# Patient Record
Sex: Female | Born: 1945 | Race: White | Hispanic: No | Smoking: Former smoker
Health system: Southern US, Community
[De-identification: ages and names within clinical notes are randomized; demographics above are authoritative.]

## PROBLEM LIST (undated history)

## (undated) DIAGNOSIS — G2581 Restless legs syndrome: Secondary | ICD-10-CM

## (undated) DIAGNOSIS — K219 Gastro-esophageal reflux disease without esophagitis: Secondary | ICD-10-CM

## (undated) DIAGNOSIS — F329 Major depressive disorder, single episode, unspecified: Secondary | ICD-10-CM

## (undated) DIAGNOSIS — R0600 Dyspnea, unspecified: Secondary | ICD-10-CM

## (undated) DIAGNOSIS — D649 Anemia, unspecified: Secondary | ICD-10-CM

## (undated) DIAGNOSIS — Z87442 Personal history of urinary calculi: Secondary | ICD-10-CM

## (undated) DIAGNOSIS — M359 Systemic involvement of connective tissue, unspecified: Secondary | ICD-10-CM

## (undated) DIAGNOSIS — Z86718 Personal history of other venous thrombosis and embolism: Secondary | ICD-10-CM

## (undated) DIAGNOSIS — F419 Anxiety disorder, unspecified: Secondary | ICD-10-CM

## (undated) DIAGNOSIS — M1990 Unspecified osteoarthritis, unspecified site: Secondary | ICD-10-CM

## (undated) DIAGNOSIS — T4145XA Adverse effect of unspecified anesthetic, initial encounter: Secondary | ICD-10-CM

## (undated) DIAGNOSIS — E1142 Type 2 diabetes mellitus with diabetic polyneuropathy: Secondary | ICD-10-CM

## (undated) DIAGNOSIS — T8859XA Other complications of anesthesia, initial encounter: Secondary | ICD-10-CM

## (undated) DIAGNOSIS — F32A Depression, unspecified: Secondary | ICD-10-CM

## (undated) DIAGNOSIS — E039 Hypothyroidism, unspecified: Secondary | ICD-10-CM

## (undated) DIAGNOSIS — G4700 Insomnia, unspecified: Secondary | ICD-10-CM

## (undated) DIAGNOSIS — I639 Cerebral infarction, unspecified: Secondary | ICD-10-CM

## (undated) HISTORY — DX: Anxiety disorder, unspecified: F41.9

## (undated) HISTORY — PX: KIDNEY STONE SURGERY: SHX686

## (undated) HISTORY — PX: CHOLECYSTECTOMY: SHX55

## (undated) HISTORY — DX: Cerebral infarction, unspecified: I63.9

## (undated) HISTORY — DX: Major depressive disorder, single episode, unspecified: F32.9

## (undated) HISTORY — DX: Depression, unspecified: F32.A

---
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
## 2011-03-27 DIAGNOSIS — G8110 Spastic hemiplegia affecting unspecified side: Secondary | ICD-10-CM | POA: Insufficient documentation

## 2011-07-06 DIAGNOSIS — F32A Depression, unspecified: Secondary | ICD-10-CM | POA: Insufficient documentation

## 2011-07-06 DIAGNOSIS — E039 Hypothyroidism, unspecified: Secondary | ICD-10-CM | POA: Insufficient documentation

## 2011-07-06 DIAGNOSIS — F5101 Primary insomnia: Secondary | ICD-10-CM | POA: Insufficient documentation

## 2012-05-06 DIAGNOSIS — N202 Calculus of kidney with calculus of ureter: Secondary | ICD-10-CM | POA: Insufficient documentation

## 2012-11-13 ENCOUNTER — Encounter: Payer: Self-pay | Admitting: Rehabilitation

## 2012-11-29 ENCOUNTER — Encounter: Payer: Self-pay | Admitting: Rehabilitation

## 2012-12-27 ENCOUNTER — Encounter: Payer: Self-pay | Admitting: Rehabilitation

## 2013-01-14 ENCOUNTER — Ambulatory Visit: Payer: Self-pay | Admitting: Pediatrics

## 2013-01-27 ENCOUNTER — Encounter: Payer: Self-pay | Admitting: Rehabilitation

## 2013-02-26 ENCOUNTER — Encounter: Payer: Self-pay | Admitting: Rehabilitation

## 2014-04-06 DIAGNOSIS — E785 Hyperlipidemia, unspecified: Secondary | ICD-10-CM | POA: Insufficient documentation

## 2014-12-28 ENCOUNTER — Encounter: Admit: 2014-12-28 | Disposition: A | Discharge status: Home / Self Care | Payer: Self-pay

## 2014-12-31 DIAGNOSIS — K219 Gastro-esophageal reflux disease without esophagitis: Secondary | ICD-10-CM | POA: Insufficient documentation

## 2015-01-28 ENCOUNTER — Encounter: Admit: 2015-01-28 | Disposition: A | Discharge status: Home / Self Care | Payer: Self-pay

## 2015-02-28 ENCOUNTER — Encounter: Payer: Self-pay | Admitting: Physical Therapy

## 2015-02-28 ENCOUNTER — Ambulatory Visit: Payer: Medicare Other | Attending: Rehabilitation | Admitting: Physical Therapy

## 2015-02-28 DIAGNOSIS — M6281 Muscle weakness (generalized): Secondary | ICD-10-CM | POA: Diagnosis not present

## 2015-02-28 DIAGNOSIS — G8114 Spastic hemiplegia affecting left nondominant side: Secondary | ICD-10-CM | POA: Diagnosis present

## 2015-02-28 DIAGNOSIS — R262 Difficulty in walking, not elsewhere classified: Secondary | ICD-10-CM | POA: Diagnosis not present

## 2015-03-01 NOTE — Therapy (Signed)
[facility] [address] [place], [address] Phone: [phone]   Fax:  [phone]  Physical Therapy Treatment  Patient Details  Name: Ariel Alvarez MRN: [id number] Date of Birth: Oct 28, 1946 Referring Provider:  Lottie Rater*  Encounter Date: 02/28/2015      PT End of Session - 02/28/15 1518    Visit Number 16   Number of Visits 25   Date for PT Re-Evaluation 03/30/15   Authorization Type 6   Authorization Time Period 20   PT Start Time 1415   PT Stop Time 1510   PT Time Calculation (min) 55 min   Activity Tolerance Patient limited by fatigue;Patient tolerated treatment well   Behavior During Therapy [facility] for tasks assessed/performed      Past Medical History  Diagnosis Date  . Anxiety   . Depression   . Stroke     History reviewed. No pertinent past surgical history.  There were no vitals filed for this visit.  Visit Diagnosis:  Muscle weakness (generalized)  Difficulty in walking      Subjective Assessment - 02/28/15 2022    Patient Stated Goals Patient would like to be able to use left arm for dressing,daily chores with less effort and walk with more confidence and strength in left leg   Currently in Pain? No/denies            [facility] PT Assessment - 03/01/15 0001    Assessment   Medical Diagnosis spastic hemiplegia affecting left nondominant side   Onset Date 10/29/13   Next MD Visit 03/30/2015   Precautions   Precautions Fall   Balance Screen   Has the patient fallen in the past 6 months No   Has the patient had a decrease in activity level because of a fear of falling?  No   Is the patient reluctant to leave their home because of a fear of falling?  No   Home Environment   Living Enviornment Private residence   Living Arrangements Spouse/significant other                 SUBJECTIVE: Patient reports she is tired today and not moving as well. She is  not sure why she is tired. Overall she feels she is still improving with physical therapy treatment for strengthening and guidance with exercises to improve walking and daily activities using left arm and leg. Her left arm/hand are tight today and her shoulders/neck area as well  PAIN (with rating/descriptors) No pain at present    OBJECTIVE: Palpation: increased tone/spasms in cervical spine into both upper trapezius muscles right> left Gait: ambulating independently without assistive device with improved base of support as compared to previous session, mild toe drag occasionally, improved hip and knee control in flexion for swing through, slow cadence today  1. With therapist assistance for positioning, control of movement with tactile and verbal cuing: sitting on edge of treatment table: ROM with assistance of therapist: upper trapezius release with lateral flexor stretch to right and left x 3 reps x 10 seconds holds, shoulder elevation and depression with tactile and verbal cues to control and complete full ROM, manual resistance and assistance with black resistive band around thighs for hip abduction 2 x 10 reps, Left UE AAROM shoulder forward elevation/extension with elbow supported and flexed at 90 degrees to decrease weight of arm,  external rotation, elbow flexion/extension with concentration on slow controlled motion without increased effort to perform, sets of  10 reps,  2. standing exercises with contact guarding and verbal cuing for good hip alignment and control of left LE with foot placement; side stepping along foam balance beam x 3 reps, with UE support on counter for safety and close supervision/contact guard as needed, 4" step up forward with each LE leading x 10 reps each with verbal cues for complete ROM and control, standing hip extension and abduction 1 x 5 reps with verbal cuing and tactile cues for correct alignment of trunk to avoid excessive forward trunk lean and lateral trunk  lean, assisted patient to car for safety following treatment session (improved posture and gait pattern noted)   Pt response for medical necessity: Patient fatigued during session today and therefore limited exercise session without NuStep performed at end of exercise session, she required verbal and tactile cues and contact guarding for safety during all standing exercises.  Patient with decreased BOS to WNL with gait,  hip and knee flexion and left hip extension improved along with improved safety awareness with walking outdoors to car  CLINICAL IMPRESSION:Patient demonstrates improvement with ability to work with left UE and LE. She requires assistance and verbal cues to perform exercises with correct alignment and controlled motions. She will benefit from additional physical therapy intervention to address weakness and controlled motion to allow improved function with left UE and LE for personal care and walking activies.     PLAN:Plan  PT EVAL, THERAP EXER-ROM EA 83M, MANUAL THERAPY; EA 15 MIN, GAIT TRAINING THERAPY, EA   Frequency & Duration 2/Week X 6Week(s)                 PT Long Term Goals - 02/28/15 1522    PT LONG TERM GOAL #1   Title Patient will be independent with home program with written instructions and minimal to no verbal cuing for self management in 6 weeks.    Time 6   Period Weeks   Status On-going   PT LONG TERM GOAL #2   Title Patient will reduce falls risk as indicated by Activities Specific Balance Confidence Scale score of 70%   Time 6   Period Weeks   Status Partially Met   PT LONG TERM GOAL #3   Title Patient will improve gait speed to 0.8 - 1.59ms on 10 m walk to indicate improved community ambulator and decreased risk for falls   Baseline 14.5 seconds (0.7 m/s: decreased: in need of intervention)   Time 6   Period Weeks   Status Partially Met   PT LONG TERM GOAL #4   Title Patient will demonstrate reduced falls risk and improved community  mobility as indicated by TUG to <13 seconds    Baseline 15.75 seconds: <14 = decreased for fall risk    Time 6   Period Weeks   Status Revised               Problem List There are no active problems to display for this patient.   BAldona Lento5/12/2014, 10:22 AM  [facility] [address] [place] [address]Phone: [phone]  Fax:  [phone]

## 2015-03-07 ENCOUNTER — Ambulatory Visit: Payer: Medicare Other | Admitting: Physical Therapy

## 2015-03-07 ENCOUNTER — Encounter: Payer: Self-pay | Admitting: Physical Therapy

## 2015-03-07 DIAGNOSIS — G8114 Spastic hemiplegia affecting left nondominant side: Secondary | ICD-10-CM | POA: Diagnosis not present

## 2015-03-07 DIAGNOSIS — M6281 Muscle weakness (generalized): Secondary | ICD-10-CM

## 2015-03-07 DIAGNOSIS — R262 Difficulty in walking, not elsewhere classified: Secondary | ICD-10-CM

## 2015-03-07 NOTE — Therapy (Signed)
[facility] [address] [place], [address] Phone: [phone]   Fax:  [phone]  Physical Therapy Treatment  Patient Details  Name: Ariel Alvarez MRN: [id number] Date of Birth: April 13, 1946 Referring Provider:  Lottie Rater*  Encounter Date: 03/07/2015      PT End of Session - 03/07/15 1550    Visit Number 17   Number of Visits 25   Date for PT Re-Evaluation 03/30/15   Authorization Type 17   Authorization Time Period 20   PT Start Time 1430   PT Stop Time 1538   PT Time Calculation (min) 68 min   Activity Tolerance Patient tolerated treatment well;Patient limited by fatigue   Behavior During Therapy [facility] for tasks assessed/performed      Past Medical History  Diagnosis Date  . Anxiety   . Depression   . Stroke     History reviewed. No pertinent past surgical history.  There were no vitals filed for this visit.  Visit Diagnosis:  Muscle weakness (generalized)  Difficulty in walking      Subjective Assessment - 03/07/15 1442    Subjective Paitent reports her left leg feels "lazy" and she feels sore in her ribs today and both shoulders and neck region. Also has a corn on her right foot which is bothering her with walkin today.    Patient Stated Goals Patient would like to be able to use left arm for dressing,daily chores with less effort and walk with more confidence and strength in left leg   Currently in Pain? Yes   Pain Score 2    Pain Location Back   Pain Orientation Mid   Pain Descriptors / Indicators Aching;Sore   Pain Type Acute pain   Pain Onset Today   Aggravating Factors  moving   Pain Relieving Factors unsure      OBJECTIVE: Palpation: increased tone/spasms in cervical spine into both upper trapezius muscles Gait: ambulating independently without assistive device with improved base of support as compared to previous session, mild toe drag occasionally, improved  hip and knee control in flexion for swing through, slow cadence today with reports of pain in right foot due to corn 1. With therapist assistance for positioning, control of movement with tactile and verbal cuing: sitting on edge of treatment table: ROM with assistance of therapist: upper trapezius release with lateral flexor stretch to right and left x 3 reps x 20 seconds holds, shoulder elevation and depression with tactile and verbal cues to control and complete full ROM x 10 reps, manual resistance and assistance with blue resistive band around thighs for hip abduction 1 x 15 reps, knee extension with 4# weight on ankles 2 x 15 reps each LE, knee flexion with blue resistive band and assistance to therapist with verbal cuing for correct technique x 25 reps each LE, Left UE AAROM shoulder forward elevation/extension with elbow supported and flexed at 90 degrees to decrease weight of arm, external rotation, elbow flexion/extension with concentration on slow controlled motion without increased effort to perform, sets of 10 reps,  2. standing exercises with contact guarding and verbal cuing for good hip alignment and control of left LE with foot placement; side stepping along foam balance beam x 2 reps, with UE support on counter for safety and close supervision/contact guard as needed, standing hip extension and abduction 1 x 5 reps with verbal cuing and tactile cues for correct alignment of trunk to avoid excessive forward trunk  lean and lateral trunk lean, walk against resistive band forward and backwards with close supervision and assistance with band x 5 reps (3-4 steps), NuStep x 7 min. (no charge),  assisted patient to car for safety following treatment session (improved posture and gait pattern noted)   Pt response for medical necessity: Patient fatigued during session today at end while finishing on NuStep., she required verbal and tactile cues and contact guarding for safety during all standing  exercises. Patient with decreased BOS to WNL with gait, hip and knee flexion and left hip extension improved along with improved safety awareness with walking outdoors to car. Patient reported having less soreness in back and overall feeling much better and more energy and strength at end of session.   CLINICAL IMPRESSION:Patient demonstrates improvement with ability to control movement for left UE and LE. She requires assistance and verbal cues to perform exercises with correct alignment and controlled motions. She will benefit from additional physical therapy intervention to address weakness and controlled motion to allow improved function with left UE and LE for personal care and walking activies.She continues to require verbal cuing as well and will continue with physical therapy in order to achieve independence with home program.                              PT Education - 03/07/15 1448    Education provided Yes   Education Details home program addressed with patient: use of ankle weights for knee extension, resistive band for knee flexion, concentrate on hip extension when walking   [name] Patient   Methods Explanation;Demonstration   Comprehension Verbalized understanding;Returned demonstration             PT Long Term Goals - 02/28/15 1522    PT LONG TERM GOAL #1   Title Patient will be independent with home program with written instructions and minimal to no verbal cuing for self management in 6 weeks.    Time 6   Period Weeks   Status On-going   PT LONG TERM GOAL #2   Title Patient will reduce falls risk as indicated by Activities Specific Balance Confidence Scale score of 70%   Time 6   Period Weeks   Status Partially Met   PT LONG TERM GOAL #3   Title Patient will improve gait speed to 0.8 - 1.84ms on 10 m walk to indicate improved community ambulator and decreased risk for falls   Baseline 14.5 seconds (0.7 m/s: decreased: in need of  intervention)   Time 6   Period Weeks   Status Partially Met   PT LONG TERM GOAL #4   Title Patient will demonstrate reduced falls risk and improved community mobility as indicated by TUG to <13 seconds    Baseline 15.75 seconds: <14 = decreased for fall risk    Time 6   Period Weeks   Status Revised               Plan - 03/07/15 1552    Clinical Impression Statement Patient demonstrated improved control of left UE and LE with exercises. Limited by fatigue and pain in right foot today. She was able to perform all exercises with assistance and close supervision/contact guard for safety with standing and walking exercises. Patient will benefit from additional physical therapy intervention to progress towards indepentent with home program without verbal cuing.    Pt will benefit from skilled therapeutic intervention in order  to improve on the following deficits Decreased strength;Impaired UE functional use;Impaired tone;Difficulty walking   Rehab Potential Good   PT Frequency 2x / week   PT Duration 6 weeks   PT Next Visit Plan theraeputic exercise for balance, walking and stregnthening left UE and LE         Problem List There are no active problems to display for this patient.   Aldona Lento 03/07/2015, 4:13 PM  [facility] [address] [place], [address] Phone: [phone]   Fax:  [phone]

## 2015-03-14 ENCOUNTER — Encounter: Payer: Self-pay | Admitting: Physical Therapy

## 2015-03-14 ENCOUNTER — Ambulatory Visit: Payer: Medicare Other | Admitting: Physical Therapy

## 2015-03-14 DIAGNOSIS — M6281 Muscle weakness (generalized): Secondary | ICD-10-CM

## 2015-03-14 DIAGNOSIS — G8114 Spastic hemiplegia affecting left nondominant side: Secondary | ICD-10-CM | POA: Diagnosis not present

## 2015-03-14 DIAGNOSIS — R262 Difficulty in walking, not elsewhere classified: Secondary | ICD-10-CM

## 2015-03-14 NOTE — Therapy (Signed)
[facility] [address] [place], [address] Phone: [phone]   Fax:  [phone]  Physical Therapy Treatment  Patient Details  Name: Ariel Alvarez MRN: [id number] Date of Birth: 1946-02-20 Referring Provider:  Lottie Rater*  Encounter Date: 03/14/2015      PT End of Session - 03/14/15 1524    Visit Number 18   Number of Visits 25   Date for PT Re-Evaluation 03/30/15   Authorization Type 18   Authorization Time Period 20   PT Start Time 1448   PT Stop Time 1528   PT Time Calculation (min) 40 min   Activity Tolerance Patient tolerated treatment well;Patient limited by fatigue   Behavior During Therapy [facility] for tasks assessed/performed      Past Medical History  Diagnosis Date  . Anxiety   . Depression   . Stroke     History reviewed. No pertinent past surgical history.  There were no vitals filed for this visit.  Visit Diagnosis:  Muscle weakness (generalized)  Difficulty in walking      Subjective Assessment - 03/14/15 1514    Subjective Patient reports that yesterday her left foot hit the floor harder than usual and she is concerned. She reports she is exercising at home regularly. She feels that therapy is helping with strength and movement.    Patient Stated Goals Patient would like to be able to use left arm for dressing,daily chores with less effort and walk with more confidence and strength in left leg   Currently in Pain? No/denies   Pain Onset --      OBJECTIVE: Palpation: increased tone/spasms in cervical spine into both upper trapezius muscles with + increased tenderness on left side  Gait: ambulating independently without assistive device  mild toe drag occasionally, improved hip and knee control in flexion for swing through, slow cadence with patient reporting she is tired 1. With therapist assistance for positioning, control of movement with tactile and verbal  cuing: sitting on edge of treatment table:therapeutic exercise for strength and flexibility with assistance of therapist: manual resistance and assistance with blue resistive band around thighs for hip abduction 1 x 15 reps, manual resistive hip abduction x 10 with end range hold x 5 seconds, hip flexion with assisted blue resistive band 10 reps each, knee flexion with blue resistive band and assistance to therapist with verbal cuing for correct technique x 15 reps each LE, Left UE AAROM shoulder forward elevation/extension with elbow supported and flexed at 90 degrees to decrease weight of arm, external rotation, elbow flexion/extension with concentration on slow controlled motion without increased effort to perform, sets of 10 reps,blue resistive band exercises for right UE including scapular row, lat pull down, elbow flexion and extension x 15 reps each with verbal and visual cues, demonstration 2. standing exercises with contact guarding and verbal cuing for good hip alignment and control of left LE with foot placement; sit to stand off high table at varying heights (24", 22" 20") sets of 5 with 30 second Rests between sets,  walk against resistive band forward and backwards with close supervision and assistance with band x 8 reps (3-4 steps),  assisted patient to car for safety following treatment session (improved posture and gait pattern noted)   Pt response for medical necessity: Patient fatigued during session today at end while performing walking against resistive band, she required verbal and tactile cues and contact guarding for safety during all standing exercises.  Patient with decreased BOS to WNL with gait, hip and knee flexion and left hip extension improved along with improved safety awareness with walking exercises against resistance and outdoors to car. Patient reported having overall feeling much better and more energy and strength at end of session.         PT Education - 03/14/15  1515    Education provided Yes   Education Details Instructed and assisted patient through exercises to isolate muscles and control movements with left UE and LE   [name] Patient   Methods Explanation;Tactile cues;Verbal cues   Comprehension Returned demonstration;Verbal cues required;Tactile cues required             PT Long Term Goals - 02/28/15 1522    PT LONG TERM GOAL #1   Title Patient will be independent with home program with written instructions and minimal to no verbal cuing for self management in 6 weeks.    Time 6   Period Weeks   Status On-going   PT LONG TERM GOAL #2   Title Patient will reduce falls risk as indicated by Activities Specific Balance Confidence Scale score of 70%   Time 6   Period Weeks   Status Partially Met   PT LONG TERM GOAL #3   Title Patient will improve gait speed to 0.8 - 1.45ms on 10 m walk to indicate improved community ambulator and decreased risk for falls   Baseline 14.5 seconds (0.7 m/s: decreased: in need of intervention)   Time 6   Period Weeks   Status Partially Met   PT LONG TERM GOAL #4   Title Patient will demonstrate reduced falls risk and improved community mobility as indicated by TUG to <13 seconds    Baseline 15.75 seconds: <14 = decreased for fall risk    Time 6   Period Weeks   Status Revised               Plan - 03/14/15 1543    Clinical Impression Statement Patient demonstrated imporved control and strength in left UE and LE with exercises. She was limited by fatigue at end of session. She was able to walk with less difficulty and with improved left hip extension and hip and knee flexion following treatment. She required assistance and close supervision/contact guard for safety with standing and walking exercises. patient will continue to benefit from additional physical therapy interveniotn to progress towards indepdnence with home program and self management of tone.   Pt will benefit from skilled  therapeutic intervention in order to improve on the following deficits Decreased strength;Impaired UE functional use;Impaired tone;Difficulty walking   Rehab Potential Good   PT Frequency 2x / week   PT Duration 6 weeks   PT Next Visit Plan theraeputic exercise for balance, walking and stregnthening left UE and LE         Problem List There are no active problems to display for this patient.  MJomarie Longs PT   03/14/2015, 4:00 PM  [facility] [address] [place] [address]Phone: [phone]  Fax:  [phone]

## 2015-03-21 ENCOUNTER — Ambulatory Visit: Payer: Medicare Other | Admitting: Physical Therapy

## 2015-03-21 ENCOUNTER — Encounter: Payer: Self-pay | Admitting: Physical Therapy

## 2015-03-21 DIAGNOSIS — M6281 Muscle weakness (generalized): Secondary | ICD-10-CM

## 2015-03-21 DIAGNOSIS — R262 Difficulty in walking, not elsewhere classified: Secondary | ICD-10-CM

## 2015-03-21 DIAGNOSIS — G8114 Spastic hemiplegia affecting left nondominant side: Secondary | ICD-10-CM | POA: Diagnosis not present

## 2015-03-21 NOTE — Therapy (Signed)
[place] [facility] [address] [place], [address] Phone: [phone]   Fax:  [phone]  Physical Therapy Treatment  Patient Details  Name: Ariel Alvarez MRN: [id number] Date of Birth: 04/07/1946 Referring Provider:  Lottie Rater*  Encounter Date: 03/21/2015      PT End of Session - 03/21/15 1540    Visit Number 19   Number of Visits 25   Date for PT Re-Evaluation 03/30/15   Authorization Type 19   Authorization Time Period 20   PT Start Time 1444   PT Stop Time 1528   PT Time Calculation (min) 44 min   Activity Tolerance Patient limited by fatigue;Patient tolerated treatment well   Behavior During Therapy [facility] for tasks assessed/performed      Past Medical History  Diagnosis Date  . Anxiety   . Depression   . Stroke     History reviewed. No pertinent past surgical history.  There were no vitals filed for this visit.  Visit Diagnosis:  Muscle weakness (generalized)  Difficulty in walking      Subjective Assessment - 03/21/15 1448    Subjective Patient reports she is tired today. Overall she is still doing well and exercises at home 2-3x/week. She reports she is more confident with walking and moving in general and is learning her limitaitons such as quick turns and going up and down curbs with caution.    Limitations Walking   Patient Stated Goals Patient would like to be able to use left arm for dressing,daily chores with less effort and walk with more confidence and strength in left leg   Currently in Pain? No/denies   Multiple Pain Sites No      Objective: Gait: slow cadence, decreased left hip flexion, extension throughout gait cycle, left UE held at side with hand in gripped position, increased tone with underlying low tone noted      [name]/Exercise - 03/21/15 1521    Exercises   Exercises Other Exercises: 1. With therapist assistance for positioning,  control of movement with tactile and verbal cuing: sitting on edge of treatment table: assistance with blue resistive band around thighs for hip abduction 1 x 15 reps, knee extension with 3# weights on ankle x 15 reps followed by knee flexion with blue resistive band and assistance of  therapist with verbal cuing for correct technique x 15 reps each LE, Left UE AAROM shoulder forward elevation/extension with elbow supported and flexed at 90 degrees to decrease weight of arm, external rotation, elbow flexion/extension with concentration on slow controlled motion without increased effort to perform, sets of 10 reps,blue resistive band exercises for right UE including scapular row, lat pull down, elbow flexion and extension x 15 reps each with verbal and visual cues, demonstration 2. standing exercises with contact guarding and verbal cuing for good hip alignment and control of left LE with foot placement; sit to stand off high table at varying heights (24", 22" 20") sets of 5 with 30 second Rests between sets, walk against resistive band forward and backwards with close supervision and assistance with band x 8 reps (3-4 steps),side stepping along blue foam balance beam x 2-3 sets, standing hip extension x 10 reps each LE at counter for support and close supervision for safety,  assisted patient to car for safety following treatment session (improved posture and gait pattern noted)   .        Pt response for medical necessity:  Patient fatigued during session today at end while performing walking against resistive band, she required verbal and tactile cues and contact guarding for safety during all standing exercises. Patient with decreased BOS to WNL with gait, hip and knee flexion and left hip extension improved along with improved safety awareness with walking exercises against resistance and outdoors to car. Patient reported having overall feeling much better and more energy and strength at end of  session           PT Education - 03/21/15 1520    Education provided Yes   Education Details re assessed home exercises for LE strengthening and walking activties.    [name] Patient   Methods Explanation;Verbal cues   Comprehension Verbalized understanding;Returned demonstration;Verbal cues required             PT Long Term Goals - 02/28/15 1522    PT LONG TERM GOAL #1   Title Patient will be independent with home program with written instructions and minimal to no verbal cuing for self management in 6 weeks.    Time 6   Period Weeks   Status On-going   PT LONG TERM GOAL #2   Title Patient will reduce falls risk as indicated by Activities Specific Balance Confidence Scale score of 70%   Time 6   Period Weeks   Status Partially Met   PT LONG TERM GOAL #3   Title Patient will improve gait speed to 0.8 - 1.69ms on 10 m walk to indicate improved community ambulator and decreased risk for falls   Baseline 14.5 seconds (0.7 m/s: decreased: in need of intervention)   Time 6   Period Weeks   Status Partially Met   PT LONG TERM GOAL #4   Title Patient will demonstrate reduced falls risk and improved community mobility as indicated by TUG to <13 seconds    Baseline 15.75 seconds: <14 = decreased for fall risk    Time 6   Period Weeks   Status Revised               Plan - 03/21/15 1545    Clinical Impression Statement Patient demonstrates improvement with confidence with standing, walking at home and in community. She is progressing with home program of exercise and should continue to progress with anticipation of transition to independent home program following re assessment next week. She continues to require verbal cues to perform exercises through full ROM     Pt will benefit from skilled therapeutic intervention in order to improve on the following deficits Decreased strength;Impaired UE functional use;Impaired tone;Difficulty walking   Rehab Potential  Good   PT Frequency 2x / week   PT Duration 6 weeks   PT Next Visit Plan theraeputic exercise for balance, walking and stregnthening left UE and LE         Problem List There are no active problems to display for this patient.  MJomarie Longs PT  03/21/2015, 8:32 PM  [facility] [address] [place] [address]Phone: [phone]  Fax:  [phone]

## 2015-03-29 ENCOUNTER — Encounter: Payer: Medicare Other | Admitting: Physical Therapy

## 2015-03-30 ENCOUNTER — Ambulatory Visit: Payer: Medicare Other | Admitting: Physical Therapy

## 2015-04-04 ENCOUNTER — Ambulatory Visit: Payer: Medicare Other | Admitting: Physical Therapy

## 2015-06-17 ENCOUNTER — Ambulatory Visit: Payer: Self-pay | Admitting: Family Medicine

## 2015-08-02 ENCOUNTER — Ambulatory Visit
Admission: EM | Admit: 2015-08-02 | Discharge: 2015-08-02 | Disposition: A | Discharge status: Home / Self Care | Payer: Medicare Other | Attending: Family Medicine | Admitting: Family Medicine

## 2015-08-02 ENCOUNTER — Encounter: Payer: Self-pay | Admitting: Emergency Medicine

## 2015-08-02 DIAGNOSIS — R21 Rash and other nonspecific skin eruption: Secondary | ICD-10-CM | POA: Diagnosis present

## 2015-08-02 DIAGNOSIS — N898 Other specified noninflammatory disorders of vagina: Secondary | ICD-10-CM

## 2015-08-02 DIAGNOSIS — B372 Candidiasis of skin and nail: Secondary | ICD-10-CM | POA: Diagnosis not present

## 2015-08-02 DIAGNOSIS — R0981 Nasal congestion: Secondary | ICD-10-CM | POA: Diagnosis present

## 2015-08-02 DIAGNOSIS — M940 Chondrocostal junction syndrome [Tietze]: Secondary | ICD-10-CM

## 2015-08-02 LAB — URINALYSIS COMPLETE WITH MICROSCOPIC (ARMC ONLY)
BILIRUBIN URINE: NEGATIVE
Bacteria, UA: NONE SEEN — AB
GLUCOSE, UA: NEGATIVE mg/dL
Hgb urine dipstick: NEGATIVE
Leukocytes, UA: NEGATIVE
Nitrite: NEGATIVE
Protein, ur: NEGATIVE mg/dL
Specific Gravity, Urine: 1.02 (ref 1.005–1.030)
pH: 6.5 (ref 5.0–8.0)

## 2015-08-02 LAB — WET PREP, GENITAL
CLUE CELLS WET PREP: NONE SEEN
Trich, Wet Prep: NONE SEEN
YEAST WET PREP: NONE SEEN

## 2015-08-02 MED ORDER — NYSTATIN 100000 UNIT/GM EX POWD: CUTANEOUS | Status: DC

## 2015-08-02 NOTE — Discharge Instructions (Signed)
Use medication as prescribed. Avoid scratching or rubbing. Monitor skin closely. Allow area to be open to air and keep dry.   Follow up with your primary care physician within the next week as scheduled. Return to Urgent care for new or worsening concerns.   Cutaneous Candidiasis Cutaneous candidiasis is a condition in which there is an overgrowth of yeast (candida) on the skin. Yeast normally live on the skin, but in small enough numbers not to cause any symptoms. In certain cases, increased growth of the yeast may cause an actual yeast infection. This kind of infection usually occurs in areas of the skin that are constantly warm and moist, such as the armpits or the groin. Yeast is the most common cause of diaper rash in babies and in people who cannot control their bowel movements (incontinence). CAUSES  The fungus that most often causes cutaneous candidiasis is Candida albicans. Conditions that can increase the risk of getting a yeast infection of the skin include:  Obesity.  Pregnancy.  Diabetes.  Taking antibiotic medicine.  Taking birth control pills.  Taking steroid medicines.  Thyroid disease.  An iron or zinc deficiency.  Problems with the immune system. SYMPTOMS   Red, swollen area of the skin.  Bumps on the skin.  Itchiness. DIAGNOSIS  The diagnosis of cutaneous candidiasis is usually based on its appearance. Light scrapings of the skin may also be taken and viewed under a microscope to identify the presence of yeast. TREATMENT  Antifungal creams may be applied to the infected skin. In severe cases, oral medicines may be needed.  HOME CARE INSTRUCTIONS   Keep your skin clean and dry.  Maintain a healthy weight.  If you have diabetes, keep your blood sugar under control. SEEK IMMEDIATE MEDICAL CARE IF:  Your rash continues to spread despite treatment.  You have a fever, chills, or abdominal pain. Document Released: 07/03/2011 Document Revised: 01/07/2012  Document Reviewed: 07/03/2011 [facility] Patient Information 2015 [place], [place]. This information is not intended to replace advice given to you by your health care provider. Make sure you discuss any questions you have with your health care provider.

## 2015-08-02 NOTE — ED Notes (Signed)
Pelvic exam done by L. Hyacinth Meeker NP and I&O cath urine obtained by this nurse. Assisted to sit on chair

## 2015-08-02 NOTE — ED Notes (Signed)
Patient c/o itchy bumps on her body for 3 months.  Patient also reports allergy symptoms like stuffy nose.

## 2015-08-02 NOTE — ED Provider Notes (Signed)
[facility] Emergency Department Provider Note  ____________________________________________  Time seen: Approximately 2:34 PM  I have reviewed the triage vital signs and the nursing notes.   HISTORY  Chief Complaint Nasal Congestion and Pruritis   HPI Ariel Alvarez is a 69 y.o. female presents for complaints of red itching and burning rash at lower abdomen. Also reports intermittent vaginal itching over last few days. States vaginal itching varies and is intermittent.  Denies vaginal bleeding, discharge, odor or pain. Denies abdominal pain. Denies chest pain, shortness of breath, back pain, leg pain or weakness. Reports history of similar with yeast infections. Denies other rash. States she does frequently have sensitive skin which reacts to certain things against her skin, but denies other rash. Denies changes in foods, medications, lotions, detergents, soaps, clothing changes, or other contacts. Denies using bath tub.  Patient states that she has intermittently noticed a rash to lower abdomen in the same area gets presents today for the last few months but states times approximately 1 week to 2 weeks the rash is been persistent. Patient states that the rash is itchy and burning sensation.  REports she has had a runny nose for a few days but states she is not here for that and does not want to be evaluated for runny nose. Denies cough, fever, sinus pressure, sore throat or facial pain. States runny nose is from her allergies.    Past Medical History  Diagnosis Date  . Anxiety   . Depression   . Stroke [facility])   with chronic left sided hemiparesis.   There are no active problems to display for this patient.   Past Surgical History  Procedure Laterality Date  . Cholecystectomy      Current Outpatient Rx  Name  Route  Sig  Dispense  Refill  . clonazePAM (KLONOPIN) 1 MG tablet   Oral   Take 2 mg by mouth at bedtime.         . eszopiclone (LUNESTA) 2  MG TABS tablet   Oral   Take 2 mg by mouth at bedtime as needed for sleep. Take immediately before bedtime         . omeprazole (PRILOSEC) 20 MG capsule   Oral   Take 20 mg by mouth daily.         . phentermine 30 MG capsule   Oral   Take 30 mg by mouth every morning.         . pravastatin (PRAVACHOL) 20 MG tablet   Oral   Take 20 mg by mouth daily.         Marland Kitchen tiZANidine (ZANAFLEX) 4 MG capsule   Oral   Take 4 mg by mouth 3 (three) times daily.         Marland Kitchen venlafaxine (EFFEXOR) 75 MG tablet   Oral   Take 75 mg by mouth 2 (two) times daily with a meal.         . cetirizine (ZYRTEC) 10 MG tablet   Oral   Take 10 mg by mouth daily.         Marland Kitchen levothyroxine (SYNTHROID, LEVOTHROID) 88 MCG tablet   Oral   Take 88 mcg by mouth daily before breakfast.         . lubiprostone (AMITIZA) 24 MCG capsule   Oral   Take 24 mcg by mouth 2 (two) times daily with a meal.         . PRAVASTATIN SODIUM PO   Oral  Take by mouth.         . venlafaxine (EFFEXOR) 50 MG tablet   Oral   Take 50 mg by mouth 2 (two) times daily.         Marland Kitchen warfarin (COUMADIN) 3 MG tablet   Oral   Take 3 mg by mouth daily.           Allergies Review of patient's allergies indicates no known allergies.  History reviewed. No pertinent family history.  Social History Social History  Substance Use Topics  . Smoking status: Former Smoker    Quit date: 10/29/1974  . Smokeless tobacco: None  . Alcohol Use: Yes    Review of Systems Constitutional: No fever/chills Eyes: No visual changes. ENT: No sore throat. Cardiovascular: Denies chest pain. Respiratory: Denies shortness of breath. Gastrointestinal: No abdominal pain.  No nausea, no vomiting.  No diarrhea.  No constipation. Genitourinary: Negative for dysuria.positive intermittent vaginal itching.  Musculoskeletal: Negative for back pain. Skin: positive for rash. Neurological: Negative for headaches, focal weakness or  numbness.  10-point ROS otherwise negative.  ____________________________________________   PHYSICAL EXAM:  VITAL SIGNS: ED Triage Vitals  Enc Vitals Group     BP 08/02/15 1309 134/73 mmHg     Pulse Rate 08/02/15 1309 82     Resp 08/02/15 1309 16     Temp 08/02/15 1309 97.6 F (36.4 C)     Temp Source 08/02/15 1309 Tympanic     SpO2 08/02/15 1309 98 %     Weight 08/02/15 1309 197 lb (89.359 kg)     Height 08/02/15 1309 5\' 3"  (1.6 m)     Head Cir --      Peak Flow --      Pain Score 08/02/15 1319 2     Pain Loc --      Pain Edu? --      Excl. in GC? --     Constitutional: Alert and oriented. Well appearing and in no acute distress. Eyes: Conjunctivae are normal. PERRL. EOMI. Head: Atraumatic.  Nose: mild clear rhinorrhea.  Mouth/Throat: Mucous membranes are moist.  Oropharynx non-erythematous. Neck: No stridor.  No cervical spine tenderness to palpation. Hematological/Lymphatic/Immunilogical: No cervical lymphadenopathy. Cardiovascular: Normal rate, regular rhythm. Grossly normal heart sounds.  Good peripheral circulation. Respiratory: Normal respiratory effort.  No retractions. Lungs CTAB. Pelvic: pelvic exam completed with RN Lynn-Anne at bedside.  External: no rash or lesions. Speculum: normal, no discharge, no swelling or bulging. Bimanual : nontender.  Gastrointestinal: Soft and nontender. No distention. Normal Bowel sounds.  No abdominal bruits. No CVA tenderness. Musculoskeletal: No lower or upper extremity tenderness nor edema.  No joint effusions. Bilateral pedal pulses equal and easily palpated.  Neurologic:  Normal speech and language. Chronic left sided hemiparesis, per patient unchanged.  Skin:  Skin is warm, dry and intact.  Left and right lower abdomen beneath abdominal skin fold and crease with well demarcated erythematic moist area, No surrounding erythema, no induration or fluctuance, no discharge. Left worse than right. No other rash. No other erythema.  No pustules or vesicles or lesions.  Psychiatric: Mood and affect are normal. Speech and behavior are normal.  ____________________________________________   LABS (all labs ordered are listed, but only abnormal results are displayed)  Labs Reviewed  WET PREP, GENITAL - Abnormal; Notable for the following:    WBC, Wet Prep HPF POC FEW (*)    All other components within normal limits  URINALYSIS COMPLETEWITH MICROSCOPIC ([facility] ONLY) - Abnormal; Notable for  the following:    Ketones, ur TRACE (*)    Bacteria, UA NONE SEEN (*)    Squamous Epithelial / LPF 0-5 (*)    All other components within normal limits  URINE CULTURE    INITIAL IMPRESSION / ASSESSMENT AND PLAN / ED COURSE  Pertinent labs & imaging results that were available during my care of the patient were reviewed by me and considered in my medical decision making (see chart for details).  Very well-appearing patient. No acute distress. Presents for the complaints of rash times approximately 1 week. Patient states that the rash intermittently will appear over last several months but persistent 1 week. States that the rash is just under her lower abdomen underneath her skin. Patient also reported intermittent vaginal itching and describes as occasional burning sensation.   Urinalysis reviewed, and no bacteria, will culture. Wet prep reviewed few WBCs, and no yeast. Vaginal speculum as well as bimanual was unremarkable. Patient with skin candidiasis at the lower abdominal skin crease. Counsel close monitoring. Will treat with topical nystatin topical powder to lower abdomen. Counseled to try to keep dry and clean. Counseled patient to follow-up with her primary care physician closely.  Counseled avoidance or soaps in vaginal area and to use water instead. Patient reports she has a follow-up with her primary care physician this week.Discussed follow up with Primary care physician this week. Discussed follow up and return parameters  including no resolution or any worsening concerns. Patient verbalized understanding and agreed to plan.   ____________________________________________   FINAL CLINICAL IMPRESSION(S) / ED DIAGNOSES  Final diagnoses:  Yeast infection of the skin  Vaginal irritation       Renford Dills, NP 08/02/15 1620

## 2016-04-06 DIAGNOSIS — G5601 Carpal tunnel syndrome, right upper limb: Secondary | ICD-10-CM | POA: Insufficient documentation

## 2016-12-30 DIAGNOSIS — R768 Other specified abnormal immunological findings in serum: Secondary | ICD-10-CM | POA: Insufficient documentation

## 2017-01-29 DIAGNOSIS — I776 Arteritis, unspecified: Secondary | ICD-10-CM | POA: Insufficient documentation

## 2017-02-03 DIAGNOSIS — I6350 Cerebral infarction due to unspecified occlusion or stenosis of unspecified cerebral artery: Secondary | ICD-10-CM | POA: Insufficient documentation

## 2017-02-06 DIAGNOSIS — I2699 Other pulmonary embolism without acute cor pulmonale: Secondary | ICD-10-CM | POA: Insufficient documentation

## 2017-02-16 ENCOUNTER — Emergency Department: Payer: Medicare Other

## 2017-02-16 ENCOUNTER — Encounter: Payer: Self-pay | Admitting: Emergency Medicine

## 2017-02-16 ENCOUNTER — Emergency Department
Admission: EM | Admit: 2017-02-16 | Discharge: 2017-02-16 | Disposition: A | Discharge status: Home / Self Care | Payer: Medicare Other | Attending: Emergency Medicine | Admitting: Emergency Medicine

## 2017-02-16 DIAGNOSIS — Z87891 Personal history of nicotine dependence: Secondary | ICD-10-CM | POA: Diagnosis not present

## 2017-02-16 DIAGNOSIS — R1084 Generalized abdominal pain: Secondary | ICD-10-CM | POA: Diagnosis not present

## 2017-02-16 DIAGNOSIS — I639 Cerebral infarction, unspecified: Secondary | ICD-10-CM | POA: Diagnosis not present

## 2017-02-16 DIAGNOSIS — Z7901 Long term (current) use of anticoagulants: Secondary | ICD-10-CM | POA: Insufficient documentation

## 2017-02-16 DIAGNOSIS — G8929 Other chronic pain: Secondary | ICD-10-CM | POA: Insufficient documentation

## 2017-02-16 HISTORY — DX: Systemic involvement of connective tissue, unspecified: M35.9

## 2017-02-16 LAB — CBC WITH DIFFERENTIAL/PLATELET
BASOS PCT: 1 %
Basophils Absolute: 0.1 10*3/uL (ref 0–0.1)
EOS ABS: 0.1 10*3/uL (ref 0–0.7)
Eosinophils Relative: 0 %
HCT: 38.3 % (ref 35.0–47.0)
HEMOGLOBIN: 12.4 g/dL (ref 12.0–16.0)
Lymphocytes Relative: 17 %
Lymphs Abs: 2.2 10*3/uL (ref 1.0–3.6)
MCH: 29.1 pg (ref 26.0–34.0)
MCHC: 32.4 g/dL (ref 32.0–36.0)
MCV: 89.7 fL (ref 80.0–100.0)
MONOS PCT: 7 %
Monocytes Absolute: 0.9 10*3/uL (ref 0.2–0.9)
NEUTROS PCT: 75 %
Neutro Abs: 9.9 10*3/uL — ABNORMAL HIGH (ref 1.4–6.5)
Platelets: 421 10*3/uL (ref 150–440)
RBC: 4.27 MIL/uL (ref 3.80–5.20)
RDW: 15.4 % — AB (ref 11.5–14.5)
WBC: 13.1 10*3/uL — ABNORMAL HIGH (ref 3.6–11.0)

## 2017-02-16 LAB — COMPREHENSIVE METABOLIC PANEL
ALBUMIN: 3 g/dL — AB (ref 3.5–5.0)
ALK PHOS: 65 U/L (ref 38–126)
ALT: 35 U/L (ref 14–54)
ANION GAP: 6 (ref 5–15)
AST: 39 U/L (ref 15–41)
BUN: 12 mg/dL (ref 6–20)
CO2: 30 mmol/L (ref 22–32)
Calcium: 8.5 mg/dL — ABNORMAL LOW (ref 8.9–10.3)
Chloride: 103 mmol/L (ref 101–111)
Creatinine, Ser: 0.65 mg/dL (ref 0.44–1.00)
GFR calc Af Amer: >60 mL/min (ref >60)
GFR calc non Af Amer: >60 mL/min (ref >60)
GLUCOSE: 124 mg/dL — AB (ref 65–99)
POTASSIUM: 3.9 mmol/L (ref 3.5–5.1)
SODIUM: 139 mmol/L (ref 135–145)
Total Bilirubin: 0.4 mg/dL (ref 0.3–1.2)
Total Protein: 6.4 g/dL — ABNORMAL LOW (ref 6.5–8.1)

## 2017-02-16 LAB — LIPASE, BLOOD: Lipase: 24 U/L (ref 11–51)

## 2017-02-16 MED ORDER — MORPHINE SULFATE (PF) 4 MG/ML IV SOLN
4.0000 mg | Freq: Once | INTRAVENOUS | Status: AC
  Administered 2017-02-16: 4 mg via INTRAVENOUS
  Filled 2017-02-16: qty 1

## 2017-02-16 MED ORDER — ONDANSETRON HCL 4 MG/2ML IJ SOLN
4.0000 mg | Freq: Once | INTRAMUSCULAR | Status: AC
  Administered 2017-02-16: 4 mg via INTRAVENOUS
  Filled 2017-02-16: qty 2

## 2017-02-16 MED ORDER — IOPAMIDOL (ISOVUE-300) INJECTION 61%
30.0000 mL | Freq: Once | INTRAVENOUS | Status: AC | PRN
  Administered 2017-02-16: 30 mL via ORAL

## 2017-02-16 MED ORDER — IOPAMIDOL (ISOVUE-300) INJECTION 61%
100.0000 mL | Freq: Once | INTRAVENOUS | Status: AC | PRN
  Administered 2017-02-16: 100 mL via INTRAVENOUS

## 2017-02-16 MED ORDER — LIDOCAINE 5 % EX PTCH: 1.0000 | MEDICATED_PATCH | Freq: Two times a day (BID) | CUTANEOUS | 0 refills | Status: AC
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
## 2017-02-16 NOTE — ED Notes (Signed)
Patient transported to CT 

## 2017-02-16 NOTE — ED Triage Notes (Signed)
Pt states has chronic abd pain that radiates down bilateral legs since having a stroke "weeks ago". Pt has left sided paralysis. Pt states pain is worse at night. Pt states she is taking gabapentin and zanaflex without relief in pain.

## 2017-02-16 NOTE — ED Notes (Signed)
Pt repositioned for comfort. Pt encouraged to drink po contrast. Pt is asking for additional pain medication, but pt drifts off to sleep while speaking to rn.

## 2017-02-16 NOTE — ED Notes (Signed)
Pt sts that she is unable to urinate at this time.  Bed pan removed

## 2017-02-16 NOTE — Discharge Instructions (Signed)
Please seek medical attention for any high fevers, chest pain, shortness of breath, change in behavior, persistent vomiting, bloody stool or any other new or concerning symptoms.  

## 2017-02-16 NOTE — ED Notes (Signed)
Pt placed on bedpan

## 2017-02-16 NOTE — ED Notes (Signed)
Report to ashley, rn

## 2017-02-16 NOTE — ED Provider Notes (Signed)
8:20 AM SW has seen patient. At this time feels it would be difficult placing from ER. Patient does have appt already scheduled today at 130 for [facility] home health to come. They would likely be the best chance for the patient to get further care. The patient will be discharged with lidoderm patch. Patient and companion both comfortable with that plan.    Phineas Semen, MD 02/16/17 [phone]

## 2017-02-16 NOTE — ED Notes (Signed)
Pt assisted with bedpan for possible bowel movement. Pt produced gas only.

## 2017-02-16 NOTE — ED Notes (Signed)
Helped pt to bathroom. Changed linens. Hooked pt back up to monitor.

## 2017-02-16 NOTE — ED Notes (Signed)
Pt assisted again with bedpan for possible urination without urine production. Feet covered for comfort.
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
## 2017-02-16 NOTE — ED Notes (Signed)
Pt assisted onto bedpan for possible urination. Pt requesting additional pain medication, dr. Manson Passey notified.
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
## 2017-02-17 DIAGNOSIS — M24542 Contracture, left hand: Secondary | ICD-10-CM | POA: Insufficient documentation

## 2017-02-27 ENCOUNTER — Emergency Department
Admission: EM | Admit: 2017-02-27 | Discharge: 2017-02-27 | Disposition: A | Discharge status: Home / Self Care | Payer: Medicare Other | Attending: Emergency Medicine | Admitting: Emergency Medicine

## 2017-02-27 ENCOUNTER — Encounter: Payer: Self-pay | Admitting: Vascular Surgery

## 2017-02-27 DIAGNOSIS — Z79899 Other long term (current) drug therapy: Secondary | ICD-10-CM | POA: Insufficient documentation

## 2017-02-27 DIAGNOSIS — Z87891 Personal history of nicotine dependence: Secondary | ICD-10-CM | POA: Insufficient documentation

## 2017-02-27 DIAGNOSIS — R52 Pain, unspecified: Secondary | ICD-10-CM | POA: Diagnosis present

## 2017-02-27 DIAGNOSIS — G894 Chronic pain syndrome: Secondary | ICD-10-CM | POA: Diagnosis not present

## 2017-02-27 DIAGNOSIS — N3000 Acute cystitis without hematuria: Secondary | ICD-10-CM

## 2017-02-27 LAB — URINALYSIS, COMPLETE (UACMP) WITH MICROSCOPIC
BILIRUBIN URINE: NEGATIVE
Glucose, UA: NEGATIVE mg/dL
Hgb urine dipstick: NEGATIVE
Ketones, ur: NEGATIVE mg/dL
NITRITE: NEGATIVE
PH: 5 (ref 5.0–8.0)
PROTEIN: NEGATIVE mg/dL
Specific Gravity, Urine: 1.029 (ref 1.005–1.030)

## 2017-02-27 LAB — BASIC METABOLIC PANEL
ANION GAP: 7 (ref 5–15)
BUN: 19 mg/dL (ref 6–20)
CO2: 31 mmol/L (ref 22–32)
Calcium: 8.8 mg/dL — ABNORMAL LOW (ref 8.9–10.3)
Chloride: 102 mmol/L (ref 101–111)
Creatinine, Ser: 0.78 mg/dL (ref 0.44–1.00)
GFR calc non Af Amer: >60 mL/min (ref >60)
GLUCOSE: 118 mg/dL — AB (ref 65–99)
POTASSIUM: 4.2 mmol/L (ref 3.5–5.1)
Sodium: 140 mmol/L (ref 135–145)

## 2017-02-27 MED ORDER — CEPHALEXIN 500 MG PO CAPS
500.0000 mg | ORAL_CAPSULE | Freq: Once | ORAL | Status: AC
  Administered 2017-02-27: 500 mg via ORAL
  Filled 2017-02-27: qty 1

## 2017-02-27 MED ORDER — CEPHALEXIN 500 MG PO CAPS
500.0000 mg | ORAL_CAPSULE | Freq: Four times a day (QID) | ORAL | 0 refills | Status: AC
Start: 2017-02-27 — End: 2017-03-09

## 2017-02-27 NOTE — ED Notes (Addendum)
Report received from [place] R.N. Pt. Alert and VS stable

## 2017-02-27 NOTE — ED Notes (Signed)
Ems here to transport pt to [facility].  Pt alert.  Family with pt.

## 2017-02-27 NOTE — ED Notes (Signed)
Per Rayfield Citizen, RN from SNF they will not take the patient back until her husband approves transfer back to the SNF.

## 2017-02-27 NOTE — ED Notes (Signed)
Handed patient her cell phone.

## 2017-02-27 NOTE — Discharge Instructions (Signed)
Please continue to take your pain medications as prescribed. Talk to the primary care physician at [facility] to make a long-term pain management plan.  Please take the entire course of antibiotics, even if you're feeling better.  Return to the emergency department for any new or severe pain, inability to keep down fluids, fainting, fever, or any other symptoms concerning to you.

## 2017-02-27 NOTE — ED Notes (Signed)
Hooked pt up to monitor.

## 2017-02-27 NOTE — ED Provider Notes (Addendum)
[facility] Emergency Department Provider Note  ____________________________________________  Time seen: Approximately 10:11 AM  I have reviewed the triage vital signs and the nursing notes.   HISTORY  Chief Complaint "Pain all over"    HPI Ariel Alvarez is a 71 y.o. 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 She does not feel they are giving her enough pain medication, "I needed every 4 hours or else it becomes full-blown." There is nothing new or different about her pain today, there is no focality, and she has not had any other symptoms including headache, vomiting, fever.  According to the patient's marked, she is receiving Norco, Xanax, gabapentin, a lidocaine patch, prednisone, baclofen, naproxen, acetaminophen, tizanidine.   Past Medical History:  Diagnosis Date  . Anxiety   . Collagen vascular disease (HCC)   . Depression   . Stroke [facility])     There are no active problems to display for this patient.   Past Surgical History:  Procedure Laterality Date  . CHOLECYSTECTOMY      Current Outpatient Rx  . Order #: [id number] Class: Print  . Order #: [id number] Class: Historical Med  . Order #: [id number] Class: Historical Med  . Order #: [id number] Class: Historical Med  . Order #: [id number] Class: Historical Med  . Order #: [id number] Class: Print  . Order #: [id number] Class: Historical Med  . Order #: [id number] Class: Print  . Order #: [id number] Class: Historical Med  . Order #: [id number] Class: Historical Med  . Order #: [id number] Class: Historical Med  . Order #: [id number] Class: Historical Med  . Order #: [id number] Class: Historical Med   . Order #: [id number] Class: Historical Med  . Order #: [id number] Class: Historical Med  . Order #: [id number] Class: Historical Med    Allergies Latex  No family history on file.  Social History Social History  Substance Use Topics  . Smoking status: Former Smoker    Quit date: 10/29/1974  . Smokeless tobacco: Never Used  . Alcohol use Yes    Review of Systems Constitutional: No fever/chills.Pain "all over." Eyes: No visual changes. ENT: Positive mild congestion which has been improving. Cardiovascular: Denies new chest pain. Denies palpitations. Respiratory: Denies shortness of breath.  Recent cough, now resolved. Gastrointestinal: No new abdominal pain.  No nausea, no vomiting.  No diarrhea.  No constipation. Genitourinary: Negative for dysuria. Musculoskeletal: Negative for new back pain. Skin: Negative for rash. Neurological: Positive for chronic left hemiparesis.  10-point ROS otherwise negative.  ____________________________________________   PHYSICAL EXAM:  VITAL SIGNS: ED Triage Vitals  Enc Vitals Group     BP 02/27/17 0930 (!) 141/71     Pulse Rate 02/27/17 0930 74     Resp 02/27/17 0930 16     Temp 02/27/17 0930 97.6 F (36.4 C)     Temp Source 02/27/17 0930 Oral     SpO2 02/27/17 0930 97 %     Weight --      Height --      Head Circumference --      Peak Flow --      Pain Score 02/27/17 0934 10     Pain Loc --      Pain Edu? --  Excl. in GC? --     Constitutional: The patient is alert and oriented and answering questions appropriately. Her speech is normal. She is chronically ill appearing but nontoxic. Eyes: Conjunctivae are normal.  EOMI. No scleral icterus. Head: Atraumatic. Nose: No congestion/rhinnorhea. Mouth/Throat: Mucous membranes are moist.  Neck: No stridor.  Supple.  No meningismus. Cardiovascular: Normal rate, regular rhythm. No murmurs, rubs or gallops.  Respiratory: Normal respiratory effort.  No accessory muscle use or  retractions. Lungs CTAB.  No wheezes, rales or ronchi. Gastrointestinal: Morbidly obese. Soft, nontender and nondistended.  No guarding or rebound.  No peritoneal signs. Musculoskeletal: No LE edema.  Neurologic:  A&Ox3.  Speech is clear.  Face and smile are symmetric.  EOMI.  left hemiparesis in the upper and lower extremities. Skin:  Skin is warm, dry and intact. No rash noted. Psychiatric: Normal mood with normal affect. Poor insight into her chronic medical conditions, but competent to make decisions.  ____________________________________________   LABS (all labs ordered are listed, but only abnormal results are displayed)  Labs Reviewed  BASIC METABOLIC PANEL - Abnormal; Notable for the following:       Result Value   Glucose, Bld 118 (*)    Calcium 8.8 (*)    All other components within normal limits  URINALYSIS, COMPLETE (UACMP) WITH MICROSCOPIC - Abnormal; Notable for the following:    Color, Urine YELLOW (*)    APPearance HAZY (*)    Leukocytes, UA SMALL (*)    Bacteria, UA MANY (*)    Squamous Epithelial / LPF 0-5 (*)    All other components within normal limits  URINE CULTURE   ____________________________________________  EKG  Not indicated ____________________________________________  RADIOLOGY  No results found.  ____________________________________________   PROCEDURES  Procedure(s) performed: None  Procedures  Critical Care performed: No ____________________________________________   INITIAL IMPRESSION / ASSESSMENT AND PLAN / ED COURSE  Pertinent labs & imaging results that were available during my care of the patient were reviewed by me and considered in my medical decision making (see chart for details).  71 y.o. female with a history of stroke resulting in left-sided hemiparesis, now at [facility] in the rehabilitation facility, presenting for chronic pain all over that has been going on for a week and is unchanged in character or severity,  nor is a have any focality. Overall, the patient has no signs or symptoms of an acute medical condition. She is hemodynamically stable. She is receiving multiple pain medications and antispasmodics on a regular basis at her rehabilitation facility. I have had a long conversation with the patient about her situation. She does not like the rehabilitation facility and is requesting to be admitted to [facility]. I have told her that she has no acute medical condition which would warrant admission at this time. I have made a call to have the primary care physician for [facility] age that I can discuss reevaluation of a long-term pain management plan, possible referral for pain management physician, and organize outpatient management for this patient. At this time, she is stable for discharge from the emergency department. Return precautions as well as follow-up instructions were discussed.  ----------------------------------------- 10:46 AM on 02/27/2017 -----------------------------------------  The patient's husband is not bedside and reports that she is not here for pain at all. He reports that she has had increased urinary frequency. She is scheduled for renal biopsy tomorrow as an outpatient. We will check a urine and a creatinine today.  ----------------------------------------- 11:46 AM on  02/27/2017 -----------------------------------------  The patient's urinalysis does show a urinary tract infection, and I have sent it for culture. We'll plan to discharge her home on Keflex, and give her the first dose here. Her creatinine is within normal limits. She is stable for discharge at this time.  ____________________________________________  FINAL CLINICAL IMPRESSION(S) / ED DIAGNOSES  Final diagnoses:  Chronic pain syndrome  Acute cystitis without hematuria         NEW MEDICATIONS STARTED DURING THIS VISIT:  New Prescriptions   CEPHALEXIN (KEFLEX) 500 MG CAPSULE    Take 1 capsule (500 mg  total) by mouth 4 (four) times daily.      Rockne Menghini, MD 02/27/17 1022    Rockne Menghini, MD 02/27/17 1047    Rockne Menghini, MD 02/27/17 1147

## 2017-02-27 NOTE — ED Triage Notes (Signed)
Pt reports to the ED via [facility] from [facility] for eval of pain all over. She had a stroke and has chronic pain r/t this per pt. Pt also states she may have rheumatoid  arthritis and states that she has an appointment at [facility] tomorrow for this but states her pain was severe so she had the SNF call 911 today. Pt states that she has pain from the top of her head to her feet.

## 2017-03-02 ENCOUNTER — Encounter: Payer: Self-pay | Admitting: Emergency Medicine

## 2017-03-02 ENCOUNTER — Emergency Department
Admission: EM | Admit: 2017-03-02 | Discharge: 2017-03-02 | Disposition: A | Discharge status: Home / Self Care | Payer: Medicare Other | Attending: Emergency Medicine | Admitting: Emergency Medicine

## 2017-03-02 ENCOUNTER — Emergency Department: Payer: Medicare Other

## 2017-03-02 DIAGNOSIS — Y929 Unspecified place or not applicable: Secondary | ICD-10-CM | POA: Insufficient documentation

## 2017-03-02 DIAGNOSIS — Z79899 Other long term (current) drug therapy: Secondary | ICD-10-CM | POA: Diagnosis not present

## 2017-03-02 DIAGNOSIS — S42202A Unspecified fracture of upper end of left humerus, initial encounter for closed fracture: Secondary | ICD-10-CM | POA: Diagnosis not present

## 2017-03-02 DIAGNOSIS — Y999 Unspecified external cause status: Secondary | ICD-10-CM | POA: Diagnosis not present

## 2017-03-02 DIAGNOSIS — Z7901 Long term (current) use of anticoagulants: Secondary | ICD-10-CM | POA: Insufficient documentation

## 2017-03-02 DIAGNOSIS — S4992XA Unspecified injury of left shoulder and upper arm, initial encounter: Secondary | ICD-10-CM | POA: Diagnosis present

## 2017-03-02 DIAGNOSIS — W19XXXA Unspecified fall, initial encounter: Secondary | ICD-10-CM | POA: Diagnosis not present

## 2017-03-02 DIAGNOSIS — Z87891 Personal history of nicotine dependence: Secondary | ICD-10-CM | POA: Insufficient documentation

## 2017-03-02 DIAGNOSIS — Y939 Activity, unspecified: Secondary | ICD-10-CM | POA: Insufficient documentation

## 2017-03-02 LAB — URINE CULTURE

## 2017-03-02 NOTE — ED Notes (Addendum)
Attempted to call reports to [facility].

## 2017-03-02 NOTE — Discharge Instructions (Signed)
We discussed your fracture with the orthopedic surgeon, Dr. Joice Lofts, who feels it will be best managed nonoperatively with a shoulder immobilizer.  Please try to keep the immobilizer in place as much as possible for comfort as well as for healing.  Use your regular pain medication or discussed with your primary care provider the need for additional pain medication.  Follow up with an outpatient appointment with Dr. Joice Lofts or one of his colleagues in about one week.

## 2017-03-02 NOTE — ED Provider Notes (Signed)
[facility] Emergency Department Provider Note  ____________________________________________   First MD Initiated Contact with Patient 03/02/17 1811     (approximate)  I have reviewed the triage vital signs and the nursing notes.   HISTORY  Chief Complaint Fall    HPI Ariel Alvarez is a 71 y.o. female with chronic left hemiparesis secondary to a recent CVA who lives at [facility].  She was sent by a EMS for evaluation of a left humerus fracture after a fall yesterday.  She does not have pain at rest and only has pain if her left arm is moved but she has very minimal movement and control over the arm due to the CVA.  She did not sustain any other injuries and denies any injury to her head and denies neck pain.  She denies chest pain, shortness of breath, nausea, vomiting, abdominal pain.  The pain in her arm as severe as the arm is moved but mild to nonexistent at rest.  The onset of the symptoms was acute yesterday were after her fall.   Past Medical History:  Diagnosis Date  . Anxiety   . Collagen vascular disease (HCC)   . Depression   . Stroke [facility])     There are no active problems to display for this patient.   Past Surgical History:  Procedure Laterality Date  . CHOLECYSTECTOMY      Prior to Admission medications   Medication Sig Start Date End Date Taking? Authorizing Provider  cephALEXin (KEFLEX) 500 MG capsule Take 1 capsule (500 mg total) by mouth 4 (four) times daily. 02/27/17 03/09/17  Rockne Menghini, MD  cetirizine (ZYRTEC) 10 MG tablet Take 10 mg by mouth daily.    [provider]  clonazePAM (KLONOPIN) 1 MG tablet Take 2 mg by mouth at bedtime.    [provider]  eszopiclone (LUNESTA) 2 MG TABS tablet Take 2 mg by mouth at bedtime as needed for sleep. Take immediately before bedtime    [provider]  levothyroxine (SYNTHROID, LEVOTHROID) 88 MCG tablet Take 88 mcg by mouth daily before  breakfast.    [provider]  lidocaine (LIDODERM) 5 % Place 1 patch onto the skin every 12 (twelve) hours. Remove & Discard patch within 12 hours or as directed by MD 02/16/17 02/16/18  Phineas Semen, MD  lubiprostone (AMITIZA) 24 MCG capsule Take 24 mcg by mouth 2 (two) times daily with a meal.    [provider]  nystatin (MYCOSTATIN/NYSTOP) 100000 UNIT/GM POWD Apply to the affected areas 2 to 3 times daily until healing is complete 08/02/15   Renford Dills, NP  omeprazole (PRILOSEC) 20 MG capsule Take 20 mg by mouth daily.    [provider]  phentermine 30 MG capsule Take 30 mg by mouth every morning.    [provider]  pravastatin (PRAVACHOL) 20 MG tablet Take 20 mg by mouth daily.    [provider]  PRAVASTATIN SODIUM PO Take by mouth.    [provider]  tiZANidine (ZANAFLEX) 4 MG capsule Take 4 mg by mouth 3 (three) times daily.    [provider]  venlafaxine (EFFEXOR) 50 MG tablet Take 50 mg by mouth 2 (two) times daily.    [provider]  venlafaxine (EFFEXOR) 75 MG tablet Take 75 mg by mouth 2 (two) times daily with a meal.    [provider]  warfarin (COUMADIN) 3 MG tablet Take 3 mg by mouth daily.  [provider]    Allergies Latex  History reviewed. No pertinent family history.  Social History Social History  Substance Use Topics  . Smoking status: Former Smoker    Quit date: 10/29/1974  . Smokeless tobacco: Never Used  . Alcohol use Yes    Review of Systems Constitutional: No fever/chills Eyes: No visual changes. ENT: No sore throat. Cardiovascular: Denies chest pain. Respiratory: Denies shortness of breath. Gastrointestinal: No abdominal pain.  No nausea, no vomiting.  No diarrhea.  No constipation. Genitourinary: Negative for dysuria. Musculoskeletal: Pain in LUE after fall yesterday, positive fracture on outpatient radiographs Integumentary: Negative for  rash. Neurological: Negative for headaches, focal weakness or numbness.   ____________________________________________   PHYSICAL EXAM:  VITAL SIGNS: ED Triage Vitals [03/02/17 1740]  Enc Vitals Group     BP 139/82     Pulse Rate (!) 105     Resp 19     Temp 97.9 F (36.6 C)     Temp Source Oral     SpO2 95 %     Weight 185 lb (83.9 kg)     Height 5\' 3"  (1.6 m)     Head Circumference      Peak Flow      Pain Score      Pain Loc      Pain Edu?      Excl. in GC?     Constitutional: Alert and oriented though appears somewhat somnolent. Eyes: Conjunctivae are normal. PERRL. EOMI. Head: Atraumatic. Nose: No congestion/rhinnorhea. Mouth/Throat: Mucous membranes are moist. Neck: No stridor.  No meningeal signs.  No cervical spine tenderness to palpation. Cardiovascular: Normal rate, regular rhythm. Good peripheral circulation. Grossly normal heart sounds. Respiratory: Normal respiratory effort.  No retractions. Lungs CTAB. Gastrointestinal: Soft and nontender. No distention.  Musculoskeletal: There is some ecchymosis and swelling in her proximal left upper arm consistent with her history of humerus fracture.  Tenderness and pain when the arm is passively moved but the patient does not have control over the arm secondary to her CVA.  Minimal grip strength in the left hand at baseline. Neurologic:  Normal speech and language. No gross focal neurologic deficits are appreciated.  Normal left-sided radial pulse. Skin:  Skin is warm, dry and intact. No rash noted.   ____________________________________________   LABS (all labs ordered are listed, but only abnormal results are displayed)  Labs Reviewed - No data to display ____________________________________________  EKG  None - EKG not ordered by ED physician ____________________________________________  RADIOLOGY   Dg Humerus Left  Result Date: 03/02/2017 CLINICAL DATA:  Fall.  Fracture EXAM: LEFT HUMERUS - 2+ VIEW  COMPARISON:  None. FINDINGS: Comminuted proximal humerus fracture. Mild lateral displacement with impaction of the fracture fragments. No dislocation. Diffuse soft tissue swelling involving the deltoid musculature. IMPRESSION: 1. Comminuted and slightly impacted proximal humerus fracture. Electronically Signed   By: Signa Kell M.D.   On: 03/02/2017 18:04    ____________________________________________   PROCEDURES  Critical Care performed: No   Procedure(s) performed:   Procedures   ____________________________________________   INITIAL IMPRESSION / ASSESSMENT AND PLAN / ED COURSE  Pertinent labs & imaging results that were available during my care of the patient were reviewed by me and considered in my medical decision making (see chart for details).  The patient is generally well-appearing and seems to be at her baseline.  She is a little bit somnolent at this time but there is no evidence of any traumatic head injury.  She wakes up and answers questions appropriately.  She is complaining of no pain at rest and only has pain when we move her arm.  I will call and speak with the orthopedic surgeon but I suspect nonoperative management would be most appropriate for her.  Although she is on warfarin there is no swelling of the arm significant enough to make me concerned for internal bleeding and her arm has soft compartments and is neurovascularly intact.   Clinical Course as of Mar 02 1902  Sat Mar 02, 2017  [id number] Discussed by phone with Dr. Joice Lofts who reviewed the radiographs and feels the patient will heal well non-operatively.  Requested shoulder immobilizer and outpatient follow up.  [CF]  1823 The patient was diagnosed with a urinary tract infection due to Klebsiella several days ago but she is being treated with Keflex and as per the sensitivities on the culture this appears to be adequate and appropriate treatment.  [CF]    Clinical Course User Index [CF] Loleta Rose, MD      ____________________________________________  FINAL CLINICAL IMPRESSION(S) / ED DIAGNOSES  Final diagnoses:  Closed fracture of proximal end of left humerus, unspecified fracture morphology, initial encounter     MEDICATIONS GIVEN DURING THIS VISIT:  Medications - No data to display   NEW OUTPATIENT MEDICATIONS STARTED DURING THIS VISIT:  New Prescriptions   No medications on file    Modified Medications   No medications on file    Discontinued Medications   No medications on file     Note:  This document was prepared using Dragon voice recognition software and may include unintentional dictation errors.    Loleta Rose, MD 03/02/17 [phone]

## 2017-03-02 NOTE — ED Triage Notes (Signed)
Pt had mechanical fall yesterday. Reports reaching for water and table slid.  Xray done at [facility] and sent for humerus fracture on left. Pt denies pain at this time, only if moves. Limited ROM left arm r/t previous CVA

## 2017-03-24 DIAGNOSIS — I82409 Acute embolism and thrombosis of unspecified deep veins of unspecified lower extremity: Secondary | ICD-10-CM | POA: Insufficient documentation

## 2017-04-26 ENCOUNTER — Encounter (INDEPENDENT_AMBULATORY_CARE_PROVIDER_SITE_OTHER): Payer: Self-pay | Admitting: Vascular Surgery

## 2017-05-06 DIAGNOSIS — I63411 Cerebral infarction due to embolism of right middle cerebral artery: Secondary | ICD-10-CM | POA: Insufficient documentation

## 2017-05-06 DIAGNOSIS — Z01818 Encounter for other preprocedural examination: Secondary | ICD-10-CM | POA: Insufficient documentation

## 2017-05-15 ENCOUNTER — Other Ambulatory Visit (INDEPENDENT_AMBULATORY_CARE_PROVIDER_SITE_OTHER): Payer: Medicare Other

## 2017-05-21 ENCOUNTER — Ambulatory Visit (INDEPENDENT_AMBULATORY_CARE_PROVIDER_SITE_OTHER): Payer: Medicare Other

## 2017-05-21 ENCOUNTER — Encounter (INDEPENDENT_AMBULATORY_CARE_PROVIDER_SITE_OTHER): Payer: Self-pay | Admitting: Vascular Surgery

## 2017-05-21 ENCOUNTER — Other Ambulatory Visit (INDEPENDENT_AMBULATORY_CARE_PROVIDER_SITE_OTHER): Payer: Self-pay | Admitting: Vascular Surgery

## 2017-05-21 ENCOUNTER — Ambulatory Visit (INDEPENDENT_AMBULATORY_CARE_PROVIDER_SITE_OTHER): Payer: Medicare Other | Admitting: Vascular Surgery

## 2017-05-21 DIAGNOSIS — I96 Gangrene, not elsewhere classified: Secondary | ICD-10-CM | POA: Diagnosis not present

## 2017-05-21 DIAGNOSIS — M359 Systemic involvement of connective tissue, unspecified: Secondary | ICD-10-CM | POA: Insufficient documentation

## 2017-05-21 DIAGNOSIS — G8929 Other chronic pain: Secondary | ICD-10-CM | POA: Insufficient documentation

## 2017-05-21 NOTE — Progress Notes (Signed)
Patient ID: Ariel Alvarez, female   DOB: 10/29/1946, 71 y.o.   MRN: [id number]  Chief Complaint  Patient presents with  . New Evaluation    HPI Ariel Alvarez is a 71 y.o. female.  I am asked to see the patient by Dr. 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  The patient reports A hangnail got infected on her right second finger. She eventually had some cream or salves get in the wound that festered and left her with a black second fingertip. She did not have any antecedent arm claudication symptoms. She denies fever or chills. She is referred for arterial evaluation. Her noninvasive studies today demonstrate normal triphasic flow throughout the right upper extremity without hemodynamically significant stenosis or signs of arterial insufficiency of the right upper extremity.   Past Medical History:  Diagnosis Date  . Anxiety   . Collagen vascular disease ([place])   . Depression   . Stroke [facility])     Past Surgical History:  Procedure Laterality Date  . CHOLECYSTECTOMY      Family History  Problem Relation Age of Onset  . Diabetes Son   . Cancer Paternal Aunt   . Stroke Paternal Grandmother     Social History Social History  Substance Use Topics  . Smoking status: Former Smoker    Quit date: 10/29/1974  . Smokeless tobacco: Never Used  . Alcohol use Yes     Allergies  Allergen Reactions  . Other Hives  . Latex Hives    Current Outpatient Prescriptions  Medication Sig Dispense Refill  . acetaminophen (TYLENOL) 325 MG tablet Take 325 mg by mouth.    . cetirizine (ZYRTEC) 10 MG tablet Take 10 mg by mouth daily.    . clonazePAM (KLONOPIN) 1 MG tablet Take 2 mg by mouth at bedtime.    . gabapentin (NEURONTIN) 300 MG capsule TAKE 2 CAPSULES TWICE A DAY AND TAKE 3 CAPSULES AT BEDTIME  0  . levothyroxine (SYNTHROID, LEVOTHROID) 100 MCG tablet Take by mouth.    . lidocaine (LIDODERM) 5 % Place 1 patch onto the skin every 12 (twelve) hours. Remove & Discard  patch within 12 hours or as directed by MD 10 patch 0  . Lifitegrast (XIIDRA) 5 % SOLN Xiidra 5 % eye drops in a dropperette  PLACE 1 DROP IN OU BID    . nystatin (MYCOSTATIN/NYSTOP) 100000 UNIT/GM POWD Apply to the affected areas 2 to 3 times daily until healing is complete 1 Bottle 0  . omeprazole (PRILOSEC) 20 MG capsule Take 20 mg by mouth daily.    Marland Kitchen oxyCODONE (OXY IR/ROXICODONE) 5 MG immediate release tablet Take 5 mg by mouth.    . phentermine 30 MG capsule Take 30 mg by mouth every morning.    Marland Kitchen PRAVASTATIN SODIUM PO Take by mouth.    . warfarin (COUMADIN) 3 MG tablet Take 3 mg by mouth daily.    . cetirizine (ZYRTEC) 10 MG tablet TAKE 1 TABLET EVERY DAY    . eszopiclone (LUNESTA) 2 MG TABS tablet Take 2 mg by mouth at bedtime as needed for sleep. Take immediately before bedtime    . levothyroxine (SYNTHROID, LEVOTHROID) 88 MCG tablet Take 88 mcg by mouth daily before breakfast.    . lubiprostone (AMITIZA) 24 MCG capsule Take 24 mcg by mouth 2 (two) times daily with a meal.    . nystatin (NYSTATIN) powder Nystop 100,000 unit/gram topical powder  APPLY TO AFFECTED AREA 3 TIMES DAILY    .  pravastatin (PRAVACHOL) 20 MG tablet Take 20 mg by mouth daily.    Marland Kitchen tiZANidine (ZANAFLEX) 4 MG capsule Take 4 mg by mouth 3 (three) times daily.    Marland Kitchen venlafaxine (EFFEXOR) 50 MG tablet Take 50 mg by mouth 2 (two) times daily.    Marland Kitchen venlafaxine (EFFEXOR) 75 MG tablet Take 75 mg by mouth 2 (two) times daily with a meal.    . warfarin (COUMADIN) 2.5 MG tablet Take 2.5 mg by mouth.     No current facility-administered medications for this visit.       REVIEW OF SYSTEMS (Negative unless checked)  Constitutional: '[]' Weight loss  '[]' Fever  '[]' Chills Cardiac: '[]' Chest pain   '[]' Chest pressure   '[]' Palpitations   '[]' Shortness of breath when laying flat   '[]' Shortness of breath at rest   '[]' Shortness of breath with exertion. Vascular:  '[]' Pain in legs with walking   '[]' Pain in legs at rest   '[]' Pain in legs when  laying flat   '[]' Claudication   '[]' Pain in feet when walking  '[]' Pain in feet at rest  '[]' Pain in feet when laying flat   '[]' History of DVT   '[]' Phlebitis   '[]' Swelling in legs   '[]' Varicose veins   '[x]' Non-healing ulcers Pulmonary:   '[]' Uses home oxygen   '[]' Productive cough   '[]' Hemoptysis   '[]' Wheeze  '[]' COPD   '[]' Asthma Neurologic:  '[]' Dizziness  '[]' Blackouts   '[]' Seizures   '[]' History of stroke   '[]' History of TIA  '[]' Aphasia   '[]' Temporary blindness   '[]' Dysphagia   '[x]' Weakness or numbness in arms   '[]' Weakness or numbness in legs Musculoskeletal:  '[]' Arthritis   '[]' Joint swelling   '[x]' Joint pain   '[]' Low back pain Hematologic:  '[]' Easy bruising  '[]' Easy bleeding   '[]' Hypercoagulable state   '[]' Anemic  '[]' Hepatitis Gastrointestinal:  '[]' Blood in stool   '[]' Vomiting blood  '[]' Gastroesophageal reflux/heartburn   '[]' Abdominal pain Genitourinary:  '[]' Chronic kidney disease   '[]' Difficult urination  '[]' Frequent urination  '[]' Burning with urination   '[]' Hematuria Skin:  '[]' Rashes   '[x]' Ulcers   '[x]' Wounds Psychological:  '[x]' History of anxiety   '[x]'  History of major depression.    Physical Exam There were no vitals taken for this visit. Gen:  WD/WN, NAD Head: [place]/AT, No temporalis wasting.  Ear/Nose/Throat: Hearing grossly intact, nares w/o erythema or drainage, oropharynx w/o Erythema/Exudate Eyes: Conjunctiva clear, sclera non-icteric  Neck: trachea midline.  No JVD.  Pulmonary:  Good air movement, clear to auscultation bilaterally.  Cardiac: RRR, normal S1, S2 Vascular:  Vessel Right Left  Radial Palpable Palpable                                   Gastrointestinal: soft, non-tender/non-distended.  Musculoskeletal: M/S 5/5 throughout.  Right second finger cyanotic and with ulceration.  Left arm in a sling. Neurologic: Sensation grossly intact in extremities.  Symmetrical.  Speech is fluent. Motor exam as listed above. Psychiatric: Judgment intact, Mood & affect appropriate for pt's clinical situation. Dermatologic: No  rashes or ulcers noted.  No cellulitis or open wounds.   Radiology No results found.  Labs Recent Results (from the past 2160 hour(s))  Basic metabolic panel     Status: Abnormal   Collection Time: 02/27/17 10:46 AM  Result Value Ref Range   Sodium 140 135 - 145 mmol/L   Potassium 4.2 3.5 - 5.1 mmol/L   Chloride 102 101 - 111 mmol/L   CO2 31 22 - 32 mmol/L  Glucose, Bld 118 (H) 65 - 99 mg/dL   BUN 19 6 - 20 mg/dL   Creatinine, Ser 0.78 0.44 - 1.00 mg/dL   Calcium 8.8 (L) 8.9 - 10.3 mg/dL   GFR calc non Af Amer >60 >60 mL/min   GFR calc Af Amer >60 >60 mL/min    Comment: (NOTE) The eGFR has been calculated using the CKD EPI equation. This calculation has not been validated in all clinical situations. eGFR's persistently <60 mL/min signify possible Chronic Kidney Disease.    Anion gap 7 5 - 15  Urinalysis, Complete w Microscopic     Status: Abnormal   Collection Time: 02/27/17 10:46 AM  Result Value Ref Range   Color, Urine YELLOW (A) YELLOW   APPearance HAZY (A) CLEAR   Specific Gravity, Urine 1.029 1.005 - 1.030   pH 5.0 5.0 - 8.0   Glucose, UA NEGATIVE NEGATIVE mg/dL   Hgb urine dipstick NEGATIVE NEGATIVE   Bilirubin Urine NEGATIVE NEGATIVE   Ketones, ur NEGATIVE NEGATIVE mg/dL   Protein, ur NEGATIVE NEGATIVE mg/dL   Nitrite NEGATIVE NEGATIVE   Leukocytes, UA SMALL (A) NEGATIVE   RBC / HPF 0-5 0 - 5 RBC/hpf   WBC, UA TOO NUMEROUS TO COUNT 0 - 5 WBC/hpf   Bacteria, UA MANY (A) NONE SEEN   Squamous Epithelial / LPF 0-5 (A) NONE SEEN  Urine culture     Status: Abnormal   Collection Time: 02/27/17 10:46 AM  Result Value Ref Range   Specimen Description URINE, CLEAN CATCH    Special Requests NONE    Culture >=100,000 COLONIES/mL KLEBSIELLA PNEUMONIAE (A)    Report Status 03/02/2017 FINAL    Organism ID, Bacteria KLEBSIELLA PNEUMONIAE (A)       Susceptibility   Klebsiella pneumoniae - MIC*    AMPICILLIN RESISTANT Resistant     CEFAZOLIN <=4 SENSITIVE Sensitive      CEFTRIAXONE <=1 SENSITIVE Sensitive     CIPROFLOXACIN <=0.25 SENSITIVE Sensitive     GENTAMICIN <=1 SENSITIVE Sensitive     IMIPENEM <=0.25 SENSITIVE Sensitive     NITROFURANTOIN 64 INTERMEDIATE Intermediate     TRIMETH/SULFA <=20 SENSITIVE Sensitive     AMPICILLIN/SULBACTAM 4 SENSITIVE Sensitive     PIP/TAZO <=4 SENSITIVE Sensitive     Extended ESBL NEGATIVE Sensitive     * >=100,000 COLONIES/mL KLEBSIELLA PNEUMONIAE    Assessment/Plan:  Collagen vascular disease (HCC) Could be contributing to her poor wound healing.  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 At this point, no arterial intervention is necessary. She should have adequate flow for wound healing and any procedure on the right hand. I will refer her to a hand surgeon. Follow-up as needed.      Leotis Pain 05/21/2017, 3:06 PM   This note was created with Dragon medical transcription system.  Any errors from dictation are unintentional.

## 2017-05-21 NOTE — Assessment & Plan Note (Signed)
Could be contributing to her poor wound healing.

## 2017-05-21 NOTE — Assessment & Plan Note (Signed)
Her noninvasive studies today demonstrate normal triphasic flow throughout the right upper extremity without hemodynamically significant stenosis or signs of arterial insufficiency of the right upper extremity. At this point, no arterial intervention is necessary. She should have adequate flow for wound healing and any procedure on the right hand. I will refer her to a hand surgeon. Follow-up as needed.

## 2017-05-22 ENCOUNTER — Other Ambulatory Visit (INDEPENDENT_AMBULATORY_CARE_PROVIDER_SITE_OTHER): Payer: Medicare Other

## 2017-06-18 ENCOUNTER — Encounter (INDEPENDENT_AMBULATORY_CARE_PROVIDER_SITE_OTHER): Payer: Medicare Other | Admitting: Vascular Surgery

## 2017-07-10 ENCOUNTER — Emergency Department
Admission: EM | Admit: 2017-07-10 | Discharge: 2017-07-10 | Disposition: A | Discharge status: Home / Self Care | Payer: Medicare Other | Attending: Emergency Medicine | Admitting: Emergency Medicine

## 2017-07-10 ENCOUNTER — Encounter: Payer: Self-pay | Admitting: *Deleted

## 2017-07-10 DIAGNOSIS — R197 Diarrhea, unspecified: Secondary | ICD-10-CM | POA: Diagnosis not present

## 2017-07-10 DIAGNOSIS — Z7901 Long term (current) use of anticoagulants: Secondary | ICD-10-CM | POA: Insufficient documentation

## 2017-07-10 DIAGNOSIS — R339 Retention of urine, unspecified: Secondary | ICD-10-CM | POA: Diagnosis present

## 2017-07-10 DIAGNOSIS — Z79899 Other long term (current) drug therapy: Secondary | ICD-10-CM | POA: Diagnosis not present

## 2017-07-10 DIAGNOSIS — Z87891 Personal history of nicotine dependence: Secondary | ICD-10-CM | POA: Diagnosis not present

## 2017-07-10 DIAGNOSIS — Z91040 Latex allergy status: Secondary | ICD-10-CM | POA: Diagnosis not present

## 2017-07-10 LAB — CBC WITH DIFFERENTIAL/PLATELET
BASOS ABS: 0.1 10*3/uL (ref 0–0.1)
Basophils Relative: 1 %
EOS ABS: 0.2 10*3/uL (ref 0–0.7)
EOS PCT: 2 %
HCT: 40.9 % (ref 35.0–47.0)
HEMOGLOBIN: 13.8 g/dL (ref 12.0–16.0)
LYMPHS PCT: 14 %
Lymphs Abs: 1.3 10*3/uL (ref 1.0–3.6)
MCH: 31.1 pg (ref 26.0–34.0)
MCHC: 33.7 g/dL (ref 32.0–36.0)
MCV: 92.1 fL (ref 80.0–100.0)
Monocytes Absolute: 0.5 10*3/uL (ref 0.2–0.9)
Monocytes Relative: 5 %
NEUTROS PCT: 78 %
Neutro Abs: 7.8 10*3/uL — ABNORMAL HIGH (ref 1.4–6.5)
PLATELETS: 299 10*3/uL (ref 150–440)
RBC: 4.44 MIL/uL (ref 3.80–5.20)
RDW: 15.4 % — ABNORMAL HIGH (ref 11.5–14.5)
WBC: 9.9 10*3/uL (ref 3.6–11.0)

## 2017-07-10 LAB — COMPREHENSIVE METABOLIC PANEL
ALT: 16 U/L (ref 14–54)
AST: 30 U/L (ref 15–41)
Albumin: 4 g/dL (ref 3.5–5.0)
Alkaline Phosphatase: 54 U/L (ref 38–126)
Anion gap: 11 (ref 5–15)
BILIRUBIN TOTAL: 0.6 mg/dL (ref 0.3–1.2)
BUN: 14 mg/dL (ref 6–20)
CO2: 24 mmol/L (ref 22–32)
Calcium: 9.4 mg/dL (ref 8.9–10.3)
Chloride: 105 mmol/L (ref 101–111)
Creatinine, Ser: 0.7 mg/dL (ref 0.44–1.00)
Glucose, Bld: 107 mg/dL — ABNORMAL HIGH (ref 65–99)
POTASSIUM: 4.3 mmol/L (ref 3.5–5.1)
Sodium: 140 mmol/L (ref 135–145)
TOTAL PROTEIN: 7.4 g/dL (ref 6.5–8.1)

## 2017-07-10 LAB — URINALYSIS, COMPLETE (UACMP) WITH MICROSCOPIC
BILIRUBIN URINE: NEGATIVE
GLUCOSE, UA: NEGATIVE mg/dL
HGB URINE DIPSTICK: NEGATIVE
KETONES UR: NEGATIVE mg/dL
Leukocytes, UA: NEGATIVE
NITRITE: NEGATIVE
PH: 5 (ref 5.0–8.0)
PROTEIN: NEGATIVE mg/dL
Specific Gravity, Urine: 1.027 (ref 1.005–1.030)

## 2017-07-10 LAB — LIPASE, BLOOD: LIPASE: 26 U/L (ref 11–51)

## 2017-07-10 NOTE — ED Triage Notes (Signed)
Pt to ED from home reporting difficulty urinating x 1 week. Pt report last time she urinated was 4:30 this morning and she felt as though "my pelvis was heavy." Pt reports burning with urination. PT denies blood in urine. Pt denies increased weakness. PT denies fevers.   Pt has hx of two strokes and has difficulty ambulating at baseline.

## 2017-07-10 NOTE — ED Provider Notes (Signed)
[facility] Emergency Department Provider Note  ____________________________________________   I have reviewed the triage vital signs and the nursing notes.   HISTORY  Chief Complaint Urinary Retention    HPI QUANTINA DERSHEM is a 71 y.o. female states she could not remember if she had urinated earlier today, but then she did urinate and she has no other complaints. No incontinence or weakness. She has had a few episodes of loose stool today. No fever no abdominal pain. No dysuria. Would like to go home. Here with caretaker.    Past Medical History:  Diagnosis Date  . Anxiety   . Collagen vascular disease (HCC)   . Depression   . Stroke [facility])     Patient Active Problem List   Diagnosis Date Noted  . Collagen vascular disease (HCC) 05/21/2017  . 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 CHOLECYSTECTOMY      Prior to Admission medications   Medication Sig Start Date End Date Taking? Authorizing Provider  acetaminophen (TYLENOL) 325 MG tablet Take 325 mg by mouth. 12/22/08   [provider]  cetirizine (ZYRTEC) 10 MG tablet Take 10 mg by mouth daily.    [provider]  cetirizine (ZYRTEC) 10 MG tablet TAKE 1 TABLET EVERY DAY 03/07/15   [provider]  clonazePAM (KLONOPIN) 1 MG tablet Take 2 mg by mouth at bedtime.    [provider]  eszopiclone (LUNESTA) 2 MG TABS tablet Take 2 mg by mouth at bedtime as needed for sleep. Take immediately before bedtime    [provider]  gabapentin (NEURONTIN) 300 MG capsule TAKE 2 CAPSULES TWICE A DAY AND TAKE 3 CAPSULES AT BEDTIME 03/12/17   [provider]  levothyroxine (SYNTHROID, LEVOTHROID) 100 MCG tablet Take by mouth. 04/19/17 04/19/18  [provider]  levothyroxine (SYNTHROID, LEVOTHROID) 88 MCG tablet Take 88 mcg by mouth daily before breakfast.    [provider]   lidocaine (LIDODERM) 5 % Place 1 patch onto the skin every 12 (twelve) hours. Remove & Discard patch within 12 hours or as directed by MD 02/16/17 02/16/18  Phineas Semen, MD  Lifitegrast Benay Spice) 5 % SOLN Xiidra 5 % eye drops in a dropperette  PLACE 1 DROP IN OU BID    [provider]  lubiprostone (AMITIZA) 24 MCG capsule Take 24 mcg by mouth 2 (two) times daily with a meal.    [provider]  nystatin (MYCOSTATIN/NYSTOP) 100000 UNIT/GM POWD Apply to the affected areas 2 to 3 times daily until healing is complete 08/02/15   Renford Dills, NP  nystatin (NYSTATIN) powder Nystop 100,000 unit/gram topical powder  APPLY TO AFFECTED AREA 3 TIMES DAILY    [provider]  omeprazole (PRILOSEC) 20 MG capsule Take 20 mg by mouth daily.    [provider]  oxyCODONE (OXY IR/ROXICODONE) 5 MG immediate release tablet Take 5 mg by mouth. 04/19/17   [provider]  phentermine 30 MG capsule Take 30 mg by mouth every morning.    [provider]  pravastatin (PRAVACHOL) 20 MG tablet Take 20 mg by mouth daily.    [provider]  PRAVASTATIN SODIUM PO Take by mouth.    [provider]  tiZANidine (ZANAFLEX) 4 MG capsule Take 4 mg by mouth 3 (three) times daily.    [provider]  venlafaxine (EFFEXOR) 50 MG tablet Take 50 mg by mouth 2 (two)  times daily.    [provider]  venlafaxine (EFFEXOR) 75 MG tablet Take 75 mg by mouth 2 (two) times daily with a meal.    [provider]  warfarin (COUMADIN) 2.5 MG tablet Take 2.5 mg by mouth.    [provider]  warfarin (COUMADIN) 3 MG tablet Take 3 mg by mouth daily.    [provider]    Allergies Other and Latex  Family History  Problem Relation Age of Onset  . Diabetes Son   . Cancer Paternal Aunt   . Stroke Paternal Grandmother     Social History Social History  Substance Use Topics  . Smoking status: Former Smoker    Quit date:  10/29/1974  . Smokeless tobacco: Never Used  . Alcohol use Yes    Review of Systems Constitutional: No fever/chills Eyes: No visual changes. ENT: No sore throat. No stiff neck no neck pain Cardiovascular: Denies chest pain. Respiratory: Denies shortness of breath. Gastrointestinal:   no vomiting.  + non bloody non melanotic diarrhea.  No constipation. Genitourinary: Negative for dysuria. Musculoskeletal: Negative lower extremity swelling Skin: Negative for rash. Neurological: Negative for severe headaches, focal weakness or numbness.   ____________________________________________   PHYSICAL EXAM:  VITAL SIGNS: ED Triage Vitals  Enc Vitals Group     BP 07/10/17 1302 114/63     Pulse Rate 07/10/17 1302 82     Resp 07/10/17 1302 16     Temp 07/10/17 1302 97.8 F (36.6 C)     Temp Source 07/10/17 1302 Oral     SpO2 07/10/17 1302 94 %     Weight 07/10/17 1303 181 lb (82.1 kg)     Height 07/10/17 1303 5\' 3"  (1.6 m)     Head Circumference --      Peak Flow --      Pain Score 07/10/17 1302 7     Pain Loc --      Pain Edu? --      Excl. in GC? --     Constitutional: Alert and oriented. Well appearing and in no acute distress. Eyes: Conjunctivae are normal Head: Atraumatic HEENT: No congestion/rhinnorhea. Mucous membranes are moist.  Oropharynx non-erythematous Neck:   Nontender with no meningismus, no masses, no stridor Cardiovascular: Normal rate, regular rhythm. Grossly normal heart sounds.  Good peripheral circulation. Respiratory: Normal respiratory effort.  No retractions. Lungs CTAB. Abdominal: Soft and nontender. No distention. No guarding no rebound Back:  There is no focal tenderness or step off.  there is no midline tenderness there are no lesions noted. there is no CVA tenderness Musculoskeletal: No lower extremity tenderness, no upper extremity tenderness. No joint effusions, no DVT signs strong distal pulses no edema Neurologic:  Normal speech and language. No  gross focal neurologic deficits are appreciated.  Skin:  Skin is warm, dry and intact. No rash noted. Psychiatric: Mood and affect are normal. Speech and behavior are normal.  ____________________________________________   LABS (all labs ordered are listed, but only abnormal results are displayed)  Labs Reviewed  COMPREHENSIVE METABOLIC PANEL - Abnormal; Notable for the following:       Result Value   Glucose, Bld 107 (*)    All other components within normal limits  CBC WITH DIFFERENTIAL/PLATELET - Abnormal; Notable for the following:    RDW 15.4 (*)    Neutro Abs 7.8 (*)    All other components within normal limits  LIPASE, BLOOD  URINALYSIS, COMPLETE (UACMP) WITH MICROSCOPIC   ____________________________________________  EKG  I personally interpreted any EKGs ordered by me or triage  ____________________________________________  RADIOLOGY  I reviewed any imaging ordered by me or triage that were performed during my shift and, if possible, patient and/or family made aware of any abnormal findings. ____________________________________________   PROCEDURES  Procedure(s) performed: None  Procedures  Critical Care performed: None  ____________________________________________   INITIAL IMPRESSION / ASSESSMENT AND PLAN / ED COURSE  Pertinent labs & imaging results that were available during my care of the patient were reviewed by me and considered in my medical decision making (see chart for details).  Pt here with no complaint aside from diarrhea. No recent travel or abx. Ab d benight blood work reassuring. No pain no tenderness. Would like to go home.  Awaiting urinalysis. As she urinated just prior and given hx of diarrhea, we did do an in and out cath. If that is reassuring we will d.c. Pt tol po and would like to go. No evidence cauda equina or acute decompensation or pathology. Pt declines f/urther w.u.  Her pvrv was scant. Return precautions and f/u given and  understood.     ____________________________________________   FINAL CLINICAL IMPRESSION(S) / ED DIAGNOSES  Final diagnoses:  None      This chart was dictated using voice recognition software.  Despite best efforts to proofread,  errors can occur which can change meaning.      Jeanmarie Plant, MD 07/10/17 1710

## 2017-07-10 NOTE — ED Notes (Signed)
Pt presents with urinary retention that has resolved. States she could not urinate for 12 hours and that she was just able to do so in the last hour. She reports pain and pressure with the retention, but states that she doesn't have any more pain. She denies pain upon urination, but states that she has to wait a while for the urine to start when she tries to urinate. Pt also reports diarrhea x 1 week, with 5 episodes in last 24 hours. NAD noted.

## 2017-07-10 NOTE — ED Notes (Signed)
Attempted IV x 1 unsuccessful.

## 2017-08-11 DIAGNOSIS — B379 Candidiasis, unspecified: Secondary | ICD-10-CM | POA: Insufficient documentation

## 2017-08-26 DIAGNOSIS — Z0000 Encounter for general adult medical examination without abnormal findings: Secondary | ICD-10-CM | POA: Insufficient documentation

## 2017-10-04 DIAGNOSIS — I10 Essential (primary) hypertension: Secondary | ICD-10-CM | POA: Insufficient documentation

## 2017-10-04 DIAGNOSIS — R3911 Hesitancy of micturition: Secondary | ICD-10-CM | POA: Insufficient documentation

## 2018-02-25 ENCOUNTER — Other Ambulatory Visit: Payer: Self-pay

## 2018-02-25 ENCOUNTER — Ambulatory Visit: Payer: Medicare Other | Attending: Internal Medicine

## 2018-02-25 DIAGNOSIS — R2689 Other abnormalities of gait and mobility: Secondary | ICD-10-CM | POA: Insufficient documentation

## 2018-02-25 DIAGNOSIS — M6281 Muscle weakness (generalized): Secondary | ICD-10-CM | POA: Diagnosis present

## 2018-02-25 DIAGNOSIS — R278 Other lack of coordination: Secondary | ICD-10-CM | POA: Diagnosis present

## 2018-02-25 NOTE — Patient Instructions (Signed)
Access Code: [email or web address]  URL: [email or web address]  Date: 02/25/2018  Prepared by: Precious Bard   Exercises  Seated Hip Adduction Isometrics with Newman Pies - 10 reps - 2 sets - 5 hold - 1x daily - 7x weekly  Seated Hip Abduction - 10 reps - 2 sets - 5 hold - 1x daily - 7x weekly  Seated March - 10 reps - 2 sets - 5 hold - 1x daily - 7x weekly  Seated Heel Toe Raises - 10 reps - 2 sets - 5 hold - 1x daily - 7x weekly  Seated Hamstring Curls with Resistance - 10 reps - 2 sets - 5 hold - 1x daily - 7x weekly

## 2018-02-25 NOTE — Therapy (Signed)
[facility] MAIN [facility] SERVICES [address] [place], [place], [address] Phone: [phone]   Fax:  [phone]  Physical Therapy Evaluation  Patient Details  Name: Ariel Alvarez MRN: [id number] Date of Birth: 11/07/1945 Referring Provider: Dr. Gaetana Michaelis   Encounter Date: 02/25/2018  PT End of Session - 02/25/18 1224    Visit Number  1    Number of Visits  16    Date for PT Re-Evaluation  04/22/18    Authorization Type  1/10    PT Start Time  0759    PT Stop Time  0857    PT Time Calculation (min)  58 min    Equipment Utilized During Treatment  Gait belt    Activity Tolerance  Patient limited by fatigue    Behavior During Therapy  [facility] for tasks assessed/performed       Past Medical History:  Diagnosis Date  . Anxiety   . Collagen vascular disease (HCC)   . Depression   . Stroke [facility])     Past Surgical History:  Procedure Laterality Date  . CHOLECYSTECTOMY      There were no vitals filed for this visit.      Subjective Assessment - 02/25/18 0811    Subjective  Patient is a pleasant 72 year old female who presents for continued care s/p CVA in 01/2017.     Patient is accompained by:  Family member    Pertinent History  AYRIANA Alvarez is a 72 y.o. 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 Feels like her walking and stability is decreased. Home Health PT stopped about 3 months ago.  Walked last year prior to stroke 01/2017 which then affected bilateral legs. Was in nursing home and fell out of bed and fractured L shoulder. Uses a walker and a lift chair to take a few steps. Stands at home with husband.     How long can you sit comfortably?  n/a    How long can you stand comfortably?  5-6 minutes    How long can you walk comfortably?  4 steps    Patient Stated Goals  Patient wants to become more independent, walk and stand better.     Currently in Pain?  No/denies       PAIN: Sometimes gets leg cramps.    PROM/AROM: Decreased hamstring length Decreased hip flexor length  STRENGTH:  Graded on a 0-5 scale Muscle Group Left Right  Shoulder flex    Shoulder Abd    Shoulder Ext    Shoulder IR/ER    Elbow    Wrist/hand    Hip Flex 3+/5 3+/5  Hip Abd 3-/5 2+/5  Hip Add 2+/5 4-/5  Hip Ext 3-/5 2+/5  Hip IR/ER    Knee Flex 4/5 4/5  Knee Ext 3+/5 3+/5  Ankle DF 3/5 3/5  Ankle PF 3/5 3/5     BALANCE: Dynamic Sitting Balance  Normal Able to sit unsupported and weight shift across midline maximally   Good Able to sit unsupported and weight shift across midline moderately   Good-/Fair+ Able to sit unsupported and weight shift across midline minimally x  Fair Minimal weight shifting ipsilateral/front, difficulty crossing midline   Fair- Reach to ipsilateral side and unable to weight shift   Poor + Able to sit unsupported with min A and reach to ipsilateral side, unable to weight shift   Poor Able to sit unsupported with  mod A and reach ipsilateral/front-can't cross midline     Standing Dynamic Balance  Normal Stand independently unsupported, able to weight shift and cross midline maximally   Good Stand independently unsupported, able to weight shift and cross midline moderately   Good-/Fair+ Stand independently unsupported, able to weight shift across midline minimally   Fair Stand independently unsupported, weight shift, and reach ipsilaterally, loss of balance when crossing midline   Poor+ Able to stand with Min A and reach ipsilaterally, unable to weight shift   Poor Able to stand with Mod A and minimally reach ipsilaterally, unable to cross midline. x    Static Sitting Balance  Normal Able to maintain balance against maximal resistance   Good Able to maintain balance against moderate resistance   Good-/Fair+ Accepts minimal resistance   Fair Able to sit unsupported without balance loss and without UE support x  Poor+ Able to maintain  with Minimal assistance from individual or chair   Poor Unable to maintain balance-requires mod/max support from individual or chair     Static Standing Balance  Normal Able to maintain standing balance against maximal resistance   Good Able to maintain standing balance against moderate resistance   Good-/Fair+ Able to maintain standing balance against minimal resistance   Fair Able to stand unsupported without UE support and without LOB for 1-2 min   Fair- Requires Min A and UE support to maintain standing without loss of balance x  Poor+ Requires mod A and UE support to maintain standing without loss of balance   Poor Requires max A and UE support to maintain standing balance without loss       GAIT: Step to pattern with hemiwalker with LLE leading. Challenged lifting bilateral LE/floor clearance of LE's.  Challenged turning L and R to turn and sit on chair.  .   OUTCOME MEASURES: TEST Outcome Interpretation                                Treat Access Code: [email or web address]  URL: [email or web address]  Date: 02/25/2018  Prepared by: Precious Bard   Exercises  Seated Hip Adduction Isometrics with Newman Pies - 10 reps - 2 sets - 5 hold - 1x daily - 7x weekly  Seated Hip Abduction - 10 reps - 2 sets - 5 hold - 1x daily - 7x weekly  Seated March - 10 reps - 2 sets - 5 hold - 1x daily - 7x weekly  Seated Heel Toe Raises - 10 reps - 2 sets - 5 hold - 1x daily - 7x weekly  Seated Hamstring Curls with Resistance - 10 reps - 2 sets - 5 hold - 1x daily - 7x weekly   [facility] PT Assessment - 02/25/18 0001      Assessment   Medical Diagnosis  CVA    Referring Provider  Dr. Gaetana Michaelis    Onset Date/Surgical Date  02/17/17    Hand Dominance  Right unable to use L       Precautions   Precautions  Fall;Shoulder fractured L shoulder    Type of Shoulder Precautions  continued fx of L shoulder      Restrictions   Weight Bearing Restrictions  No      Balance Screen   Has the  patient fallen in the past 6 months  Yes    How many times?  2    Has the patient had a decrease in activity  level because of a fear of falling?   Yes    Is the patient reluctant to leave their home because of a fear of falling?   Yes      Home Environment   Living Environment  Private residence    Living Arrangements  Spouse/significant other    Available Help at Discharge  Family    Type of Home  House    Home Access  Ramped entrance    Home Layout  One level    Home Equipment  Walker - 4 wheels;Bedside commode;Toilet riser;Grab bars - toilet;Grab bars - tub/shower;Shower Radiographer, therapeutic      Prior Function   Level of Independence  Independent with basic ADLs    Vocation  Retired    Leisure  read bible, going out for drives, out to dinner, shopping      Cognition   Overall Cognitive Status  History of cognitive impairments - at baseline short term memory deficits      Observation/Other Assessments   Observations  supination of L foot in seated position    Other Surveys   Other Surveys    Activities of Balance Confidence Scale (ABC Scale)   7.5%     Lower Extremity Functional Scale   9/80      Sensation   Light Touch  Impaired by gross assessment      Coordination   Gross Motor Movements are Fluid and Coordinated  No    Heel Shin Test  challenged with amplitude and velocity      Posture/Postural Control   Posture/Postural Control  Postural limitations    Postural Limitations  Forward head;Rounded Shoulders L shoulder drop      ROM / Strength   AROM / PROM / Strength  --      Transfers   Transfers  Sit to Stand;Stand to Sit;Stand Pivot Transfers    Sit to Stand  4: Min assist    Sit to Stand Details  Tactile cues for initiation tactile /assist at spine for forward weight shift    Five time sit to stand comments   unable to perform    Stand to Sit  4: Min assist    Stand to Sit Details (indicate cue type and reason)  Tactile cues for initiation    Stand Pivot Transfers   4: Min assist    Stand Pivot Transfer Details (indicate cue type and reason)  Require use of HemiWalker and tactile/Min A for intiation of stand               Objective measurements completed on examination: See above findings.              PT Education - 02/25/18 1028    Education provided  Yes    Education Details  HEP, POC,    [name]  Patient;Spouse    Methods  Explanation;Demonstration;Tactile cues;Verbal cues;Handout    Comprehension  Verbalized understanding;Returned demonstration       PT Short Term Goals - 02/25/18 1228      PT SHORT TERM GOAL #1   Title  Patient will perform one sit to stand independently to increase independence with transfers    Baseline  4/30: Min A    Time  2    Period  Weeks    Status  New    Target Date  03/11/18      PT SHORT TERM GOAL #2   Title  Patient will be independent in home exercise program  to improve strength/mobility for better functional independence with ADLs.    Baseline  4/30: exercises given    Time  2    Period  Weeks    Status  New    Target Date  03/11/18      PT SHORT TERM GOAL #3   Title  Patient will ambulate 10 ft with hemiwalker and CGA for improved home mobility    Baseline  ambulate 5 ft.     Time  2    Period  Weeks    Status  New    Target Date  03/11/18      PT SHORT TERM GOAL #4   Title  Patient will increase BLE gross strength to 4-/5 as to improve functional strength for independent gait, increased standing tolerance and increased ADL ability.    Baseline  4/30: 3/5    Time  2    Period  Weeks    Status  New    Target Date  03/11/18        PT Long Term Goals - 02/25/18 1231      PT LONG TERM GOAL #1   Title  Patient will perform 5 STS independently with hemi walker to demonstrate improved home mobility.     Baseline  4/30: require min A for one    Time  8    Period  Weeks    Status  New    Target Date  04/22/18      PT LONG TERM GOAL #2   Title  Patient will  increase ABC scale score >50% to demonstrate better functional mobility and better confidence with ADLs.     Baseline  4/30: 7.5%    Time  8    Period  Weeks    Status  New    Target Date  04/22/18      PT LONG TERM GOAL #3   Title  Patient will ambulate 60 ft with hemi walker and CGA to improve home mobility and independence with gait.     Baseline  4/30: ambulate 5 ft    Time  8    Period  Weeks    Status  New    Target Date  04/22/18      PT LONG TERM GOAL #4   Title  Patient will increase BLE gross strength to 4+/5 as to improve functional strength for independent gait, increased standing tolerance and increased ADL ability    Baseline  4/30: 3/5     Time  8    Period  Weeks    Status  New    Target Date  04/22/18      PT LONG TERM GOAL #5   Title  Patient will increase lower extremity functional scale to >30/80 to demonstrate improved functional mobility and increased tolerance with ADLs.     Baseline  4/30: 9/80    Time  8    Period  Weeks    Status  New    Target Date  04/22/18             Plan - 02/25/18 1226    Clinical Impression Statement  Patient is a pleasant 72 year old female who presents s/p CVA 01/2017 for generalized weakness and difficulty walking/balance. First stroke combined with stroke on 01/2017 resulted in left paralysis of UE. Patient utilizes hemiwalker in RUE for short ambulation and transfer at this time. Patient scored 7.5% on ABC scale demonstrating low perceived physical functional  and  LEFS 9/80 indicating high perceived disability. Coordination of LLE and RLE challenging with decreased coordination noted on RLE. Patient requires occasional Min A for STS transfer from surface. Ambulates ~5 ft with step to pattern with LLE leading and utilization of hemiwalker. Is challenged by turning to sit often "missing" the target. Patient will benefit from skilled physical therapy to improve transfers, LE strength, mobility, and increase independence with  ADLs.     History and Personal Factors relevant to plan of care:  This patient presents with  3, personal factors/ comorbidities and  4 body elements including body structures and functions, activity limitations and or participation restrictions. Patient's condition is evolving    Clinical Presentation  Evolving    Clinical Presentation due to:  concurrent vasculitis, changing mobility status     Clinical Decision Making  Moderate    Rehab Potential  Fair    Clinical Impairments Affecting Rehab Potential  (+) good family support (-) year since stroke    PT Frequency  2x / week    PT Duration  8 weeks    PT Treatment/Interventions  ADLs/Self Care Home Management;Cryotherapy;Electrical Stimulation;Moist Heat;Traction;Ultrasound;Therapeutic exercise;Therapeutic activities;Functional mobility training;Stair training;Gait training;DME Instruction;Balance training;Neuromuscular re-education;Patient/family education;Orthotic Fit/Training;Manual techniques;Passive range of motion;Energy conservation;Visual/perceptual remediation/compensation    PT Next Visit Plan  transfer, sts, LE strength    PT Home Exercise Plan  see sheet    Consulted and Agree with Plan of Care  Patient;Family member/caregiver    Family Member Consulted  spouse       Patient will benefit from skilled therapeutic intervention in order to improve the following deficits and impairments:  Decreased strength, Impaired UE functional use, Impaired tone, Difficulty walking, Abnormal gait, Decreased activity tolerance, Decreased balance, Decreased knowledge of precautions, Decreased endurance, Decreased coordination, Decreased knowledge of use of DME, Decreased mobility, Decreased safety awareness, Impaired perceived functional ability, Impaired flexibility, Impaired sensation, Improper body mechanics, Postural dysfunction  Visit Diagnosis: Other abnormalities of gait and mobility  Muscle weakness (generalized)  Other lack of  coordination     Problem List Patient Active Problem List   Diagnosis Date Noted  . Collagen vascular disease (HCC) 05/21/2017  . 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

## 2018-02-26 NOTE — Addendum Note (Signed)
Addended by: Claudie Fisherman on: 02/26/2018 05:44 PM   Modules accepted: Orders

## 2018-03-03 ENCOUNTER — Ambulatory Visit: Payer: Medicare Other | Attending: Internal Medicine

## 2018-03-03 DIAGNOSIS — R2689 Other abnormalities of gait and mobility: Secondary | ICD-10-CM | POA: Diagnosis not present

## 2018-03-03 DIAGNOSIS — M6281 Muscle weakness (generalized): Secondary | ICD-10-CM

## 2018-03-03 DIAGNOSIS — R278 Other lack of coordination: Secondary | ICD-10-CM | POA: Insufficient documentation

## 2018-03-03 NOTE — Therapy (Signed)
[facility] MAIN [facility] SERVICES [address] [place], [place], [address] Phone: [phone]   Fax:  [phone]  Physical Therapy Treatment  Patient Details  Name: Ariel Alvarez MRN: [id number] Date of Birth: 1946/04/14 Referring Provider: Dr. Gaetana Michaelis   Encounter Date: 03/03/2018  PT End of Session - 03/03/18 1327    Visit Number  2    Number of Visits  16    Date for PT Re-Evaluation  04/22/18    Authorization Type  2/10    PT Start Time  1305    PT Stop Time  1345    PT Time Calculation (min)  40 min    Equipment Utilized During Treatment  Gait belt    Activity Tolerance  Patient limited by fatigue    Behavior During Therapy  [facility] for tasks assessed/performed       Past Medical History:  Diagnosis Date  . Anxiety   . Collagen vascular disease (HCC)   . Depression   . Stroke [facility])     Past Surgical History:  Procedure Laterality Date  . CHOLECYSTECTOMY      There were no vitals filed for this visit.  Subjective Assessment - 03/03/18 1307    Subjective  Patient reports doing some of her exercises since last session. No stumbles or falls. No specific questions or concerns at this time. She would like to learn how to use her wheelchair for self-propulsion at home.     Patient is accompained by:  Family member    Pertinent History  Ariel Alvarez is a 72 y.o. 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 Feels like her walking and stability is decreased. Home Health PT stopped about 3 months ago.  Walked last year prior to stroke 01/2017 which then affected bilateral legs. Was in nursing home and fell out of bed and fractured L shoulder. Uses a walker and a lift chair to take a few steps. Stands at home with husband.     How long can you sit comfortably?  n/a    How long can you stand comfortably?  5-6 minutes    How long can you walk comfortably?  4 steps    Patient  Stated Goals  Patient wants to become more independent, walk and stand better.     Currently in Pain?  No/denies          TREATMENT  Ther-ex  Seated hip adduction with ball x 10; Seated hip abduction with manual resistance x 10; Seated marches with mirror for feedback alternating LE x 10 each; Seated heel/toe raises x 10; Seated HS curls with theraband x 10; Sit to stand without UE support 2 x 5; Quantum leg press 90# x 15;                      PT Education - 03/03/18 1317    Education provided  Yes    Education Details  HEP compliance, exercise technique     [name]  Patient    Methods  Explanation;Demonstration;Verbal cues    Comprehension  Verbalized understanding;Returned demonstration       PT Short Term Goals - 02/25/18 1228      PT SHORT TERM GOAL #1   Title  Patient will perform one sit to stand independently to increase independence with transfers    Baseline  4/30: Min A    Time  2  Period  Weeks    Status  New    Target Date  03/11/18      PT SHORT TERM GOAL #2   Title  Patient will be independent in home exercise program to improve strength/mobility for better functional independence with ADLs.    Baseline  4/30: exercises given    Time  2    Period  Weeks    Status  New    Target Date  03/11/18      PT SHORT TERM GOAL #3   Title  Patient will ambulate 10 ft with hemiwalker and CGA for improved home mobility    Baseline  ambulate 5 ft.     Time  2    Period  Weeks    Status  New    Target Date  03/11/18      PT SHORT TERM GOAL #4   Title  Patient will increase BLE gross strength to 4-/5 as to improve functional strength for independent gait, increased standing tolerance and increased ADL ability.    Baseline  4/30: 3/5    Time  2    Period  Weeks    Status  New    Target Date  03/11/18        PT Long Term Goals - 02/25/18 1231      PT LONG TERM GOAL #1   Title  Patient will perform 5 STS independently  with hemi walker to demonstrate improved home mobility.     Baseline  4/30: require min A for one    Time  8    Period  Weeks    Status  New    Target Date  04/22/18      PT LONG TERM GOAL #2   Title  Patient will increase ABC scale score >50% to demonstrate better functional mobility and better confidence with ADLs.     Baseline  4/30: 7.5%    Time  8    Period  Weeks    Status  New    Target Date  04/22/18      PT LONG TERM GOAL #3   Title  Patient will ambulate 60 ft with hemi walker and CGA to improve home mobility and independence with gait.     Baseline  4/30: ambulate 5 ft    Time  8    Period  Weeks    Status  New    Target Date  04/22/18      PT LONG TERM GOAL #4   Title  Patient will increase BLE gross strength to 4+/5 as to improve functional strength for independent gait, increased standing tolerance and increased ADL ability    Baseline  4/30: 3/5     Time  8    Period  Weeks    Status  New    Target Date  04/22/18      PT LONG TERM GOAL #5   Title  Patient will increase lower extremity functional scale to >30/80 to demonstrate improved functional mobility and increased tolerance with ADLs.     Baseline  4/30: 9/80    Time  8    Period  Weeks    Status  New    Target Date  04/22/18            Plan - 03/04/18 1530    Clinical Impression Statement  Pt demonstrates good motivaiton with therapy today. She struggles with vertical upright orientation with seated marches and is able to utilize  mirror feedback to improve positioning. She completes all exercises as instructed but does struggle some with transfer to/from the leg press. Pt encouraged to continue HEP and follow-up as scheduled.     Rehab Potential  Fair    Clinical Impairments Affecting Rehab Potential  (+) good family support (-) year since stroke    PT Frequency  2x / week    PT Duration  8 weeks    PT Treatment/Interventions  ADLs/Self Care Home Management;Cryotherapy;Electrical  Stimulation;Moist Heat;Traction;Ultrasound;Therapeutic exercise;Therapeutic activities;Functional mobility training;Stair training;Gait training;DME Instruction;Balance training;Neuromuscular re-education;Patient/family education;Orthotic Fit/Training;Manual techniques;Passive range of motion;Energy conservation;Visual/perceptual remediation/compensation    PT Next Visit Plan  transfer, sts, LE strength    PT Home Exercise Plan  see sheet    Consulted and Agree with Plan of Care  Patient;Family member/caregiver    Family Member Consulted  spouse       Patient will benefit from skilled therapeutic intervention in order to improve the following deficits and impairments:  Decreased strength, Impaired UE functional use, Impaired tone, Difficulty walking, Abnormal gait, Decreased activity tolerance, Decreased balance, Decreased knowledge of precautions, Decreased endurance, Decreased coordination, Decreased knowledge of use of DME, Decreased mobility, Decreased safety awareness, Impaired perceived functional ability, Impaired flexibility, Impaired sensation, Improper body mechanics, Postural dysfunction  Visit Diagnosis: Other abnormalities of gait and mobility  Muscle weakness (generalized)     Problem List Patient Active Problem List   Diagnosis Date Noted  . Collagen vascular disease (HCC) 05/21/2017  . 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 [place], [place], [address] Phone: [phone]   Fax:  [phone]  Name: Ariel Alvarez MRN: [id number] Date of Birth: Aug 30, 1946

## 2018-03-05 ENCOUNTER — Ambulatory Visit: Payer: Medicare Other | Admitting: Physical Therapy

## 2018-03-05 ENCOUNTER — Encounter: Payer: Self-pay | Admitting: Physical Therapy

## 2018-03-05 DIAGNOSIS — R278 Other lack of coordination: Secondary | ICD-10-CM

## 2018-03-05 DIAGNOSIS — R2689 Other abnormalities of gait and mobility: Secondary | ICD-10-CM

## 2018-03-05 DIAGNOSIS — M6281 Muscle weakness (generalized): Secondary | ICD-10-CM

## 2018-03-05 NOTE — Therapy (Signed)
[facility] MAIN [facility] SERVICES [address] [place], [place], [address] Phone: [phone]   Fax:  [phone]  Physical Therapy Treatment  Patient Details  Name: Ariel Alvarez MRN: [id number] Date of Birth: [birth year] Referring Provider: Dr. Gaetana Michaelis   Encounter Date: 03/05/2018  PT End of Session - 03/05/18 1302    Visit Number  3    Number of Visits  16    Date for PT Re-Evaluation  04/22/18    Authorization Type  3/10    PT Start Time  1307    PT Stop Time  1342    PT Time Calculation (min)  35 min    Equipment Utilized During Treatment  Gait belt    Activity Tolerance  Patient limited by fatigue    Behavior During Therapy  [facility] for tasks assessed/performed       Past Medical History:  Diagnosis Date  . Anxiety   . Collagen vascular disease (HCC)   . Depression   . Stroke [facility])     Past Surgical History:  Procedure Laterality Date  . CHOLECYSTECTOMY      There were no vitals filed for this visit.  Subjective Assessment - 03/05/18 1318    Subjective  Pt reports she is doing well. Pt has been completing her HEP without questions or concerns.      Patient is accompained by:  Family member    Pertinent History  [name] is a 72 y.o. 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 Feels like her walking and stability is decreased. Home Health PT stopped about 3 months ago.  Walked last year prior to stroke 01/2017 which then affected bilateral legs. Was in nursing home and fell out of bed and fractured L shoulder. Uses a walker and a lift chair to take a few steps. Stands at home with husband.     How long can you sit comfortably?  n/a    How long can you stand comfortably?  5-6 minutes    How long can you walk comfortably?  4 steps    Patient Stated Goals  Patient wants to become more independent, walk and stand better.     Currently in Pain?  No/denies         TREATMENT Seated hip adduction with manual resistance x 10; Seated hip abduction with manual resistance x 10; Seated marches with mirror for feedback alternating LE x 10 each; Seated HS curls Bil with manual resistance x 10; Sit to stand without UE support x5.  Demonstration and cues to scoot toward edge of seat and to achieve sufficient anterior lean.  Cues for safety with hand placement.   Ambulating in gym with hemiwalker with cues to keep hemiwalker at side instead of in front and for forward gaze.  Pt ambulated 2x25 ft with seated rest break in between.                         PT Education - 03/05/18 1301    Education provided  Yes    Education Details  Exercise technique    [name]  Patient    Methods  Explanation;Demonstration;Verbal cues    Comprehension  Verbalized understanding;Returned demonstration;Verbal cues required;Need further instruction       PT Short Term Goals - 02/25/18 1228      PT SHORT TERM GOAL #1   Title  Patient will  perform one sit to stand independently to increase independence with transfers    Baseline  4/30: Min A    Time  2    Period  Weeks    Status  New    Target Date  03/11/18      PT SHORT TERM GOAL #2   Title  Patient will be independent in home exercise program to improve strength/mobility for better functional independence with ADLs.    Baseline  4/30: exercises given    Time  2    Period  Weeks    Status  New    Target Date  03/11/18      PT SHORT TERM GOAL #3   Title  Patient will ambulate 10 ft with hemiwalker and CGA for improved home mobility    Baseline  ambulate 5 ft.     Time  2    Period  Weeks    Status  New    Target Date  03/11/18      PT SHORT TERM GOAL #4   Title  Patient will increase BLE gross strength to 4-/5 as to improve functional strength for independent gait, increased standing tolerance and increased ADL ability.    Baseline  4/30: 3/5    Time  2    Period  Weeks     Status  New    Target Date  03/11/18        PT Long Term Goals - 02/25/18 1231      PT LONG TERM GOAL #1   Title  Patient will perform 5 STS independently with hemi walker to demonstrate improved home mobility.     Baseline  4/30: require min A for one    Time  8    Period  Weeks    Status  New    Target Date  04/22/18      PT LONG TERM GOAL #2   Title  Patient will increase ABC scale score >50% to demonstrate better functional mobility and better confidence with ADLs.     Baseline  4/30: 7.5%    Time  8    Period  Weeks    Status  New    Target Date  04/22/18      PT LONG TERM GOAL #3   Title  Patient will ambulate 60 ft with hemi walker and CGA to improve home mobility and independence with gait.     Baseline  4/30: ambulate 5 ft    Time  8    Period  Weeks    Status  New    Target Date  04/22/18      PT LONG TERM GOAL #4   Title  Patient will increase BLE gross strength to 4+/5 as to improve functional strength for independent gait, increased standing tolerance and increased ADL ability    Baseline  4/30: 3/5     Time  8    Period  Weeks    Status  New    Target Date  04/22/18      PT LONG TERM GOAL #5   Title  Patient will increase lower extremity functional scale to >30/80 to demonstrate improved functional mobility and increased tolerance with ADLs.     Baseline  4/30: 9/80    Time  8    Period  Weeks    Status  New    Target Date  04/22/18            Plan - 03/05/18 1319  Clinical Impression Statement  Pt completed therapeutic exercises with fatigue noted at end of each set.  Pt required cues for safety and improved mechanics with sit<>stand.  Pt ambulated 2x25 ft with hemiwalker this date with cues for proper positioning of hemiwalker for improved safety.  Pt will benefit from continued skilled PT interventions for improved gait mechanics, strength, and independence.      Rehab Potential  Fair    Clinical Impairments Affecting Rehab Potential  (+)  good family support (-) year since stroke    PT Frequency  2x / week    PT Duration  8 weeks    PT Treatment/Interventions  ADLs/Self Care Home Management;Cryotherapy;Electrical Stimulation;Moist Heat;Traction;Ultrasound;Therapeutic exercise;Therapeutic activities;Functional mobility training;Stair training;Gait training;DME Instruction;Balance training;Neuromuscular re-education;Patient/family education;Orthotic Fit/Training;Manual techniques;Passive range of motion;Energy conservation;Visual/perceptual remediation/compensation    PT Next Visit Plan  transfer, sts, LE strength    PT Home Exercise Plan  see sheet    Consulted and Agree with Plan of Care  Patient;Family member/caregiver    Family Member Consulted  spouse       Patient will benefit from skilled therapeutic intervention in order to improve the following deficits and impairments:  Decreased strength, Impaired UE functional use, Impaired tone, Difficulty walking, Abnormal gait, Decreased activity tolerance, Decreased balance, Decreased knowledge of precautions, Decreased endurance, Decreased coordination, Decreased knowledge of use of DME, Decreased mobility, Decreased safety awareness, Impaired perceived functional ability, Impaired flexibility, Impaired sensation, Improper body mechanics, Postural dysfunction  Visit Diagnosis: Other abnormalities of gait and mobility  Muscle weakness (generalized)  Other lack of coordination     Problem List Patient Active Problem List   Diagnosis Date Noted  . Collagen vascular disease (HCC) 05/21/2017  . 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 [place], [place], [address] Phone: [phone]   Fax:  [phone]  Name: Ariel Alvarez MRN: [id number] Date of Birth: 1946-01-16

## 2018-03-10 ENCOUNTER — Ambulatory Visit: Payer: Medicare Other

## 2018-03-10 DIAGNOSIS — R2689 Other abnormalities of gait and mobility: Secondary | ICD-10-CM

## 2018-03-10 DIAGNOSIS — R278 Other lack of coordination: Secondary | ICD-10-CM

## 2018-03-10 DIAGNOSIS — M6281 Muscle weakness (generalized): Secondary | ICD-10-CM

## 2018-03-10 NOTE — Therapy (Signed)
[place] [facility] MAIN [facility] SERVICES [address] [place], [place], [address] Phone: [phone]   Fax:  [phone]  Physical Therapy Treatment  Patient Details  Name: Ariel Alvarez MRN: [id number] Date of Birth: December 29, 1945 Referring Provider: Dr. Gaetana Michaelis   Encounter Date: 03/10/2018  PT End of Session - 03/10/18 1334    Visit Number  4    Number of Visits  16    Date for PT Re-Evaluation  04/22/18    Authorization Type  4/10    PT Start Time  1300    PT Stop Time  1344    PT Time Calculation (min)  44 min    Equipment Utilized During Treatment  Gait belt    Activity Tolerance  Patient limited by fatigue    Behavior During Therapy  [facility] for tasks assessed/performed       Past Medical History:  Diagnosis Date  . Anxiety   . Collagen vascular disease (HCC)   . Depression   . Stroke [facility])     Past Surgical History:  Procedure Laterality Date  . CHOLECYSTECTOMY      There were no vitals filed for this visit.  Subjective Assessment - 03/10/18 1303    Subjective  Patient has some R shoulder pain. Arrived hungry to session so started session eating a quick snack. No stumbles or falls.     Patient is accompained by:  Family member    Pertinent History  Ariel Alvarez is a 72 y.o. 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 Feels like her walking and stability is decreased. Home Health PT stopped about 3 months ago.  Walked last year prior to stroke 01/2017 which then affected bilateral legs. Was in nursing home and fell out of bed and fractured L shoulder. Uses a walker and a lift chair to take a few steps. Stands at home with husband.     Limitations  Walking    How long can you sit comfortably?  n/a    How long can you stand comfortably?  5-6 minutes    How long can you walk comfortably?  4 steps    Patient Stated Goals  Patient wants to become more independent, walk and  stand better.     Currently in Pain?  Yes    Pain Score  2     Pain Location  Shoulder    Pain Orientation  Right    Pain Descriptors / Indicators  Aching    Pain Type  Acute pain    Pain Onset  Today    Pain Frequency  Intermittent       TREATMENT Seated hip adduction with manual resistance x 10; Seated hip abduction with manual resistance x 10; Seated marches with mirror for feedback alternating LE x 10 each; seated heel raises.  Seated HS curls Bil with RTB x 10;  Ambulating in gym with hemiwalker with cues to keep hemiwalker at side instead of in front and for forward gaze with mirror in front for visual cueing. Ambulate pushing wheelchair with RUE and CGA second trial.  Pt ambulated 2x25 ft with seated rest break in between. Turn around R side with hemiwalker and mod A for hemiwalker placement.    Nustep Lvl 1 4 minutes LE only assistance for LLE for placement of knee   Pt. response to medical necessity:  Patient will continue to benefit from skilled physical therapy to improve gait  mechanics, strength, and independence.                       PT Education - 03/10/18 1304    Education provided  Yes    Education Details  exercise technique    [name]  Patient    Methods  Explanation;Demonstration;Verbal cues    Comprehension  Verbalized understanding;Returned demonstration;Need further instruction       PT Short Term Goals - 02/25/18 1228      PT SHORT TERM GOAL #1   Title  Patient will perform one sit to stand independently to increase independence with transfers    Baseline  4/30: Min A    Time  2    Period  Weeks    Status  New    Target Date  03/11/18      PT SHORT TERM GOAL #2   Title  Patient will be independent in home exercise program to improve strength/mobility for better functional independence with ADLs.    Baseline  4/30: exercises given    Time  2    Period  Weeks    Status  New    Target Date  03/11/18      PT SHORT  TERM GOAL #3   Title  Patient will ambulate 10 ft with hemiwalker and CGA for improved home mobility    Baseline  ambulate 5 ft.     Time  2    Period  Weeks    Status  New    Target Date  03/11/18      PT SHORT TERM GOAL #4   Title  Patient will increase BLE gross strength to 4-/5 as to improve functional strength for independent gait, increased standing tolerance and increased ADL ability.    Baseline  4/30: 3/5    Time  2    Period  Weeks    Status  New    Target Date  03/11/18        PT Long Term Goals - 02/25/18 1231      PT LONG TERM GOAL #1   Title  Patient will perform 5 STS independently with hemi walker to demonstrate improved home mobility.     Baseline  4/30: require min A for one    Time  8    Period  Weeks    Status  New    Target Date  04/22/18      PT LONG TERM GOAL #2   Title  Patient will increase ABC scale score >50% to demonstrate better functional mobility and better confidence with ADLs.     Baseline  4/30: 7.5%    Time  8    Period  Weeks    Status  New    Target Date  04/22/18      PT LONG TERM GOAL #3   Title  Patient will ambulate 60 ft with hemi walker and CGA to improve home mobility and independence with gait.     Baseline  4/30: ambulate 5 ft    Time  8    Period  Weeks    Status  New    Target Date  04/22/18      PT LONG TERM GOAL #4   Title  Patient will increase BLE gross strength to 4+/5 as to improve functional strength for independent gait, increased standing tolerance and increased ADL ability    Baseline  4/30: 3/5     Time  8  Period  Weeks    Status  New    Target Date  04/22/18      PT LONG TERM GOAL #5   Title  Patient will increase lower extremity functional scale to >30/80 to demonstrate improved functional mobility and increased tolerance with ADLs.     Baseline  4/30: 9/80    Time  8    Period  Weeks    Status  New    Target Date  04/22/18            Plan - 03/10/18 1336    Clinical Impression  Statement  Patient requires Mod A for placement of hemiwalker when turning, however demonstrates improved independence with ambulation for short durations. Pt. Fatigues quickly requiring rest breaks at this time. Patient educated on ambulating pushing WC for use at home. Patient will continue to benefit from skilled physical therapy to improve gait mechanics, strength, and independence.     Rehab Potential  Fair    Clinical Impairments Affecting Rehab Potential  (+) good family support (-) year since stroke    PT Frequency  2x / week    PT Duration  8 weeks    PT Treatment/Interventions  ADLs/Self Care Home Management;Cryotherapy;Electrical Stimulation;Moist Heat;Traction;Ultrasound;Therapeutic exercise;Therapeutic activities;Functional mobility training;Stair training;Gait training;DME Instruction;Balance training;Neuromuscular re-education;Patient/family education;Orthotic Fit/Training;Manual techniques;Passive range of motion;Energy conservation;Visual/perceptual remediation/compensation    PT Next Visit Plan  transfer, sts, LE strength    PT Home Exercise Plan  see sheet    Consulted and Agree with Plan of Care  Patient;Family member/caregiver    Family Member Consulted  spouse       Patient will benefit from skilled therapeutic intervention in order to improve the following deficits and impairments:  Decreased strength, Impaired UE functional use, Impaired tone, Difficulty walking, Abnormal gait, Decreased activity tolerance, Decreased balance, Decreased knowledge of precautions, Decreased endurance, Decreased coordination, Decreased knowledge of use of DME, Decreased mobility, Decreased safety awareness, Impaired perceived functional ability, Impaired flexibility, Impaired sensation, Improper body mechanics, Postural dysfunction  Visit Diagnosis: Other abnormalities of gait and mobility  Muscle weakness (generalized)  Other lack of coordination     Problem List Patient Active Problem  List   Diagnosis Date Noted  . Collagen vascular disease (HCC) 05/21/2017  . 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

## 2018-03-12 ENCOUNTER — Ambulatory Visit: Payer: Medicare Other

## 2018-03-14 ENCOUNTER — Ambulatory Visit: Payer: Medicare Other

## 2018-03-17 ENCOUNTER — Ambulatory Visit: Payer: Medicare Other

## 2018-03-17 DIAGNOSIS — R278 Other lack of coordination: Secondary | ICD-10-CM

## 2018-03-17 DIAGNOSIS — M6281 Muscle weakness (generalized): Secondary | ICD-10-CM

## 2018-03-17 DIAGNOSIS — R2689 Other abnormalities of gait and mobility: Secondary | ICD-10-CM | POA: Diagnosis not present

## 2018-03-17 NOTE — Therapy (Signed)
[place] [facility] MAIN [facility] SERVICES [address] [place], [place], [address] Phone: [phone]   Fax:  [phone]  Physical Therapy Treatment  Patient Details  Name: Ariel Alvarez MRN: [id number] Date of Birth: 1946/05/04 Referring Provider: Dr. Gaetana Michaelis   Encounter Date: 03/17/2018  PT End of Session - 03/17/18 1319    Visit Number  5    Number of Visits  16    Date for PT Re-Evaluation  04/22/18    Authorization Type  5/10    PT Start Time  1301    PT Stop Time  1345    PT Time Calculation (min)  44 min    Equipment Utilized During Treatment  Gait belt    Activity Tolerance  Patient limited by fatigue    Behavior During Therapy  [facility] for tasks assessed/performed       Past Medical History:  Diagnosis Date  . Anxiety   . Collagen vascular disease (HCC)   . Depression   . Stroke [facility])     Past Surgical History:  Procedure Laterality Date  . CHOLECYSTECTOMY      There were no vitals filed for this visit.  Subjective Assessment - 03/17/18 1306    Subjective  Patietn reports doing well. Reports compliance with HEP. No stumbles or falls last session.     Patient is accompained by:  Family member    Pertinent History  Ariel Alvarez is a 72 y.o. 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 Feels like her walking and stability is decreased. Home Health PT stopped about 3 months ago.  Walked last year prior to stroke 01/2017 which then affected bilateral legs. Was in nursing home and fell out of bed and fractured L shoulder. Uses a walker and a lift chair to take a few steps. Stands at home with husband.     Limitations  Walking    How long can you sit comfortably?  n/a    How long can you stand comfortably?  5-6 minutes    How long can you walk comfortably?  4 steps    Patient Stated Goals  Patient wants to become more independent, walk and stand better.     Currently in  Pain?  No/denies          TREATMENT Seated hip adduction with manual resistance x 10; 5 second holds Seated hip abduction with manual resistance x 10; Seated HS curls Bil with RTB x 10; Seated ankle pumps    Ambulating in gym with hemiwalker with cues to keep hemiwalker at side instead of in front and for forward gaze with mirror in front for visual cueing. Ambulate pushing wheelchair with RUE and CGA second trial.  Pt ambulated 4x28 ft with seated rest break in between. Turn around R side with hemiwalker and mod A for hemiwalker placement.     Seated: 3lb ankle weight   Marches 20x each leg ; 2 sets  LAQ 15x each leg; 2 sets   Static stand in // bars without UE support   Pt. response to medical necessity:  Patient will continue to benefit from skilled physical therapy to improve gait mechanics, strength, and independence                      PT Education - 03/17/18 1317    Education provided  Yes    Education Details  exercise technique  [name]  Patient    Methods  Explanation;Demonstration;Verbal cues    Comprehension  Verbalized understanding;Returned demonstration       PT Short Term Goals - 02/25/18 1228      PT SHORT TERM GOAL #1   Title  Patient will perform one sit to stand independently to increase independence with transfers    Baseline  4/30: Min A    Time  2    Period  Weeks    Status  New    Target Date  03/11/18      PT SHORT TERM GOAL #2   Title  Patient will be independent in home exercise program to improve strength/mobility for better functional independence with ADLs.    Baseline  4/30: exercises given    Time  2    Period  Weeks    Status  New    Target Date  03/11/18      PT SHORT TERM GOAL #3   Title  Patient will ambulate 10 ft with hemiwalker and CGA for improved home mobility    Baseline  ambulate 5 ft.     Time  2    Period  Weeks    Status  New    Target Date  03/11/18      PT SHORT TERM GOAL #4    Title  Patient will increase BLE gross strength to 4-/5 as to improve functional strength for independent gait, increased standing tolerance and increased ADL ability.    Baseline  4/30: 3/5    Time  2    Period  Weeks    Status  New    Target Date  03/11/18        PT Long Term Goals - 02/25/18 1231      PT LONG TERM GOAL #1   Title  Patient will perform 5 STS independently with hemi walker to demonstrate improved home mobility.     Baseline  4/30: require min A for one    Time  8    Period  Weeks    Status  New    Target Date  04/22/18      PT LONG TERM GOAL #2   Title  Patient will increase ABC scale score >50% to demonstrate better functional mobility and better confidence with ADLs.     Baseline  4/30: 7.5%    Time  8    Period  Weeks    Status  New    Target Date  04/22/18      PT LONG TERM GOAL #3   Title  Patient will ambulate 60 ft with hemi walker and CGA to improve home mobility and independence with gait.     Baseline  4/30: ambulate 5 ft    Time  8    Period  Weeks    Status  New    Target Date  04/22/18      PT LONG TERM GOAL #4   Title  Patient will increase BLE gross strength to 4+/5 as to improve functional strength for independent gait, increased standing tolerance and increased ADL ability    Baseline  4/30: 3/5     Time  8    Period  Weeks    Status  New    Target Date  04/22/18      PT LONG TERM GOAL #5   Title  Patient will increase lower extremity functional scale to >30/80 to demonstrate improved functional mobility and increased tolerance with ADLs.  Baseline  4/30: 9/80    Time  8    Period  Weeks    Status  New    Target Date  04/22/18            Plan - 03/17/18 1323    Clinical Impression Statement  Patient improving with functional ambulation with improved length/duration. Seated strengthening implementing weights performed with minimal cueing for velocity and muscle recruitment. Patient will continue to benefit from  skilled physical therapy to improve gait mechanics, strength, and independence    Rehab Potential  Fair    Clinical Impairments Affecting Rehab Potential  (+) good family support (-) year since stroke    PT Frequency  2x / week    PT Duration  8 weeks    PT Treatment/Interventions  ADLs/Self Care Home Management;Cryotherapy;Electrical Stimulation;Moist Heat;Traction;Ultrasound;Therapeutic exercise;Therapeutic activities;Functional mobility training;Stair training;Gait training;DME Instruction;Balance training;Neuromuscular re-education;Patient/family education;Orthotic Fit/Training;Manual techniques;Passive range of motion;Energy conservation;Visual/perceptual remediation/compensation    PT Next Visit Plan  transfer, sts, LE strength    PT Home Exercise Plan  see sheet    Consulted and Agree with Plan of Care  Patient;Family member/caregiver    Family Member Consulted  spouse       Patient will benefit from skilled therapeutic intervention in order to improve the following deficits and impairments:  Decreased strength, Impaired UE functional use, Impaired tone, Difficulty walking, Abnormal gait, Decreased activity tolerance, Decreased balance, Decreased knowledge of precautions, Decreased endurance, Decreased coordination, Decreased knowledge of use of DME, Decreased mobility, Decreased safety awareness, Impaired perceived functional ability, Impaired flexibility, Impaired sensation, Improper body mechanics, Postural dysfunction  Visit Diagnosis: Other abnormalities of gait and mobility  Muscle weakness (generalized)  Other lack of coordination     Problem List Patient Active Problem List   Diagnosis Date Noted  . Collagen vascular disease (HCC) 05/21/2017  . 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

## 2018-03-19 ENCOUNTER — Ambulatory Visit: Payer: Medicare Other

## 2018-03-19 DIAGNOSIS — R2689 Other abnormalities of gait and mobility: Secondary | ICD-10-CM | POA: Diagnosis not present

## 2018-03-19 DIAGNOSIS — M6281 Muscle weakness (generalized): Secondary | ICD-10-CM

## 2018-03-19 DIAGNOSIS — R278 Other lack of coordination: Secondary | ICD-10-CM

## 2018-03-19 NOTE — Patient Instructions (Signed)
Access Code: [email or web address]  URL: [email or web address]  Date: 03/19/2018  Prepared by: Precious Bard   Exercises  Standing March with Counter Support - 10 reps - 1 sets - 1 hold - 1x daily - 7x weekly  With someone home .

## 2018-03-19 NOTE — Therapy (Signed)
[place] [facility] MAIN [facility] SERVICES [address] [place], [place], [address] Phone: [phone]   Fax:  [phone]  Physical Therapy Treatment  Patient Details  Name: Ariel Alvarez MRN: [id number] Date of Birth: [birth year] Referring Provider: Dr. Gaetana Michaelis   Encounter Date: 03/19/2018  PT End of Session - 03/19/18 1310    Visit Number  6    Number of Visits  16    Date for PT Re-Evaluation  04/22/18    Authorization Type  6/10    PT Start Time  1259    PT Stop Time  1345    PT Time Calculation (min)  46 min    Equipment Utilized During Treatment  Gait belt    Activity Tolerance  Patient limited by fatigue    Behavior During Therapy  [facility] for tasks assessed/performed       Past Medical History:  Diagnosis Date  . Anxiety   . Collagen vascular disease (HCC)   . Depression   . Stroke [facility])     Past Surgical History:  Procedure Laterality Date  . CHOLECYSTECTOMY      There were no vitals filed for this visit.  Subjective Assessment - 03/19/18 1303    Subjective  Patient still struggling with yeast infection. Reports soreness after last session but no pain.     Patient is accompained by:  Family member    Pertinent History  Ariel Alvarez is a 72 y.o. 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 Feels like her walking and stability is decreased. Home Health PT stopped about 3 months ago.  Walked last year prior to stroke 01/2017 which then affected bilateral legs. Was in nursing home and fell out of bed and fractured L shoulder. Uses a walker and a lift chair to take a few steps. Stands at home with husband.     Limitations  Walking    How long can you sit comfortably?  n/a    How long can you stand comfortably?  5-6 minutes    How long can you walk comfortably?  4 steps    Patient Stated Goals  Patient wants to become more independent, walk and stand better.     Currently  in Pain?  No/denies       Decreasing UE support while static standing in // bars; 2 x 60 seconds  Standing marches in // bars with BUE support 10x each leg  Standing extensions 12x each direction ; required max verbal cueing, LLE challenged with posterior activation with minimal distance in posterior direction performed.   Standing abduction 10x each direction   Seated hamstring stretch with 6" step 2x30 seconds each side.     Seated: 3lb ankle weight               Marches 10x each leg ; 2 sets             LAQ 15x each leg; 2 sets    Seated abduction/ER  with RTB around ankles. 10x each side    Pt. response to medical necessity:  Patient will continue to benefit from skilled physical therapy to improve gait mechanics, strength, and independence                          PT Education - 03/19/18 1309    Education provided  Yes    Education Details  exercise technique  [name]  Patient    Methods  Explanation;Demonstration;Verbal cues    Comprehension  Verbalized understanding;Returned demonstration       PT Short Term Goals - 02/25/18 1228      PT SHORT TERM GOAL #1   Title  Patient will perform one sit to stand independently to increase independence with transfers    Baseline  4/30: Min A    Time  2    Period  Weeks    Status  New    Target Date  03/11/18      PT SHORT TERM GOAL #2   Title  Patient will be independent in home exercise program to improve strength/mobility for better functional independence with ADLs.    Baseline  4/30: exercises given    Time  2    Period  Weeks    Status  New    Target Date  03/11/18      PT SHORT TERM GOAL #3   Title  Patient will ambulate 10 ft with hemiwalker and CGA for improved home mobility    Baseline  ambulate 5 ft.     Time  2    Period  Weeks    Status  New    Target Date  03/11/18      PT SHORT TERM GOAL #4   Title  Patient will increase BLE gross strength to 4-/5 as to  improve functional strength for independent gait, increased standing tolerance and increased ADL ability.    Baseline  4/30: 3/5    Time  2    Period  Weeks    Status  New    Target Date  03/11/18        PT Long Term Goals - 02/25/18 1231      PT LONG TERM GOAL #1   Title  Patient will perform 5 STS independently with hemi walker to demonstrate improved home mobility.     Baseline  4/30: require min A for one    Time  8    Period  Weeks    Status  New    Target Date  04/22/18      PT LONG TERM GOAL #2   Title  Patient will increase ABC scale score >50% to demonstrate better functional mobility and better confidence with ADLs.     Baseline  4/30: 7.5%    Time  8    Period  Weeks    Status  New    Target Date  04/22/18      PT LONG TERM GOAL #3   Title  Patient will ambulate 60 ft with hemi walker and CGA to improve home mobility and independence with gait.     Baseline  4/30: ambulate 5 ft    Time  8    Period  Weeks    Status  New    Target Date  04/22/18      PT LONG TERM GOAL #4   Title  Patient will increase BLE gross strength to 4+/5 as to improve functional strength for independent gait, increased standing tolerance and increased ADL ability    Baseline  4/30: 3/5     Time  8    Period  Weeks    Status  New    Target Date  04/22/18      PT LONG TERM GOAL #5   Title  Patient will increase lower extremity functional scale to >30/80 to demonstrate improved functional mobility and increased tolerance with ADLs.  Baseline  4/30: 9/80    Time  8    Period  Weeks    Status  New    Target Date  04/22/18            Plan - 03/19/18 1313    Clinical Impression Statement  Patient introduced into standing and standing interventions in // bars. Required Mod A to return to seated position due to fear of falling with fatigue at end of balance intervention. Hamstring's tight bilaterally and required seated stretch. Patient will continue to benefit from skilled  physical therapy to improve gait mechanics, strength, and independence    Rehab Potential  Fair    Clinical Impairments Affecting Rehab Potential  (+) good family support (-) year since stroke    PT Frequency  2x / week    PT Duration  8 weeks    PT Treatment/Interventions  ADLs/Self Care Home Management;Cryotherapy;Electrical Stimulation;Moist Heat;Traction;Ultrasound;Therapeutic exercise;Therapeutic activities;Functional mobility training;Stair training;Gait training;DME Instruction;Balance training;Neuromuscular re-education;Patient/family education;Orthotic Fit/Training;Manual techniques;Passive range of motion;Energy conservation;Visual/perceptual remediation/compensation    PT Next Visit Plan  transfer, sts, LE strength    PT Home Exercise Plan  see sheet    Consulted and Agree with Plan of Care  Patient;Family member/caregiver    Family Member Consulted  spouse       Patient will benefit from skilled therapeutic intervention in order to improve the following deficits and impairments:  Decreased strength, Impaired UE functional use, Impaired tone, Difficulty walking, Abnormal gait, Decreased activity tolerance, Decreased balance, Decreased knowledge of precautions, Decreased endurance, Decreased coordination, Decreased knowledge of use of DME, Decreased mobility, Decreased safety awareness, Impaired perceived functional ability, Impaired flexibility, Impaired sensation, Improper body mechanics, Postural dysfunction  Visit Diagnosis: Other abnormalities of gait and mobility  Muscle weakness (generalized)  Other lack of coordination     Problem List Patient Active Problem List   Diagnosis Date Noted  . Collagen vascular disease (HCC) 05/21/2017  . 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 SERVICES [address] [place], [place], [address] Phone: [phone]    Fax:  [phone]  Name: Ariel Alvarez MRN: [id number] Date of Birth: 03-02-1946

## 2018-03-26 ENCOUNTER — Ambulatory Visit: Payer: Medicare Other

## 2018-03-31 ENCOUNTER — Ambulatory Visit: Payer: Medicare Other | Attending: Internal Medicine

## 2018-03-31 VITALS — BP 128/65 | HR 79

## 2018-03-31 DIAGNOSIS — R2689 Other abnormalities of gait and mobility: Secondary | ICD-10-CM | POA: Diagnosis not present

## 2018-03-31 DIAGNOSIS — R278 Other lack of coordination: Secondary | ICD-10-CM | POA: Diagnosis present

## 2018-03-31 DIAGNOSIS — M6281 Muscle weakness (generalized): Secondary | ICD-10-CM | POA: Diagnosis present

## 2018-03-31 NOTE — Therapy (Signed)
[facility] MAIN [facility] SERVICES [address] [place], [place], [address] Phone: [phone]   Fax:  [phone]  Physical Therapy Treatment  Patient Details  Name: Ariel Alvarez MRN: [id number] Date of Birth: [birth year] Referring Provider: Dr. Gaetana Michaelis   Encounter Date: 03/31/2018  PT End of Session - 03/31/18 1318    Visit Number  6    Number of Visits  16    Date for PT Re-Evaluation  04/22/18    Authorization Type  7/10    PT Start Time  1300    PT Stop Time  1344    PT Time Calculation (min)  44 min    Equipment Utilized During Treatment  Gait belt    Activity Tolerance  Patient limited by fatigue    Behavior During Therapy  [facility] for tasks assessed/performed       Past Medical History:  Diagnosis Date  . Anxiety   . Collagen vascular disease (HCC)   . Depression   . Stroke [facility])     Past Surgical History:  Procedure Laterality Date  . CHOLECYSTECTOMY      Vitals:   03/31/18 1304  BP: 128/65  Pulse: 79    Subjective Assessment - 03/31/18 1303    Subjective  Patient reports falling on tuesday, reports she "blacked out" and fell. Was sick afterwards and has not walked or stood much since then.     Patient is accompained by:  Family member    Pertinent History  Ariel Alvarez is a 72 y.o. 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 Feels like her walking and stability is decreased. Home Health PT stopped about 3 months ago.  Walked last year prior to stroke 01/2017 which then affected bilateral legs. Was in nursing home and fell out of bed and fractured L shoulder. Uses a walker and a lift chair to take a few steps. Stands at home with husband.     Limitations  Walking    How long can you sit comfortably?  n/a    How long can you stand comfortably?  5-6 minutes    How long can you walk comfortably?  4 steps    Patient Stated Goals  Patient wants to become more  independent, walk and stand better.     Currently in Pain?  No/denies       TREATMENT Seated hip adduction with manual resistance x 10; 3 second holds, BLE; 2 sets  Seated hip abduction with manual resistance x 10; 3 second holds; 3 sets  Seated marches with mirror for feedback alternating LE x 16 each; 2 sets ; second set with RTB around knees  Seated HS curls Bil with RTB x 10; BLE; 2 sets   Resisted pf 2x10 RTB  Seated adduction ball squeezes 10x 3 second holds 2 sets   Seated LAQ 10x 3 second holds , 2 sets   Pt. response to medical necessity:  Patient will continue to benefit from skilled physical therapy to improve gait mechanics, strength, and independence.                            PT Education - 03/31/18 1317    Education provided  Yes    Education Details  exercise technique     [name]  Patient    Methods  Explanation;Demonstration;Verbal cues    Comprehension  Verbalized understanding;Returned  demonstration       PT Short Term Goals - 02/25/18 1228      PT SHORT TERM GOAL #1   Title  Patient will perform one sit to stand independently to increase independence with transfers    Baseline  4/30: Min A    Time  2    Period  Weeks    Status  New    Target Date  03/11/18      PT SHORT TERM GOAL #2   Title  Patient will be independent in home exercise program to improve strength/mobility for better functional independence with ADLs.    Baseline  4/30: exercises given    Time  2    Period  Weeks    Status  New    Target Date  03/11/18      PT SHORT TERM GOAL #3   Title  Patient will ambulate 10 ft with hemiwalker and CGA for improved home mobility    Baseline  ambulate 5 ft.     Time  2    Period  Weeks    Status  New    Target Date  03/11/18      PT SHORT TERM GOAL #4   Title  Patient will increase BLE gross strength to 4-/5 as to improve functional strength for independent gait, increased standing tolerance and  increased ADL ability.    Baseline  4/30: 3/5    Time  2    Period  Weeks    Status  New    Target Date  03/11/18        PT Long Term Goals - 02/25/18 1231      PT LONG TERM GOAL #1   Title  Patient will perform 5 STS independently with hemi walker to demonstrate improved home mobility.     Baseline  4/30: require min A for one    Time  8    Period  Weeks    Status  New    Target Date  04/22/18      PT LONG TERM GOAL #2   Title  Patient will increase ABC scale score >50% to demonstrate better functional mobility and better confidence with ADLs.     Baseline  4/30: 7.5%    Time  8    Period  Weeks    Status  New    Target Date  04/22/18      PT LONG TERM GOAL #3   Title  Patient will ambulate 60 ft with hemi walker and CGA to improve home mobility and independence with gait.     Baseline  4/30: ambulate 5 ft    Time  8    Period  Weeks    Status  New    Target Date  04/22/18      PT LONG TERM GOAL #4   Title  Patient will increase BLE gross strength to 4+/5 as to improve functional strength for independent gait, increased standing tolerance and increased ADL ability    Baseline  4/30: 3/5     Time  8    Period  Weeks    Status  New    Target Date  04/22/18      PT LONG TERM GOAL #5   Title  Patient will increase lower extremity functional scale to >30/80 to demonstrate improved functional mobility and increased tolerance with ADLs.     Baseline  4/30: 9/80    Time  8    Period  Weeks    Status  New    Target Date  04/22/18            Plan - 03/31/18 1331    Clinical Impression Statement  Patient required decrease in intensity level of interventions due to illness and fatigue. Patient challenged maintaining constant contraction with multiple episodes of fatigue resulting in waves of contraction.  Patient will continue to benefit from skilled physical therapy to improve gait mechanics, strength, and independence.     Rehab Potential  Fair    Clinical  Impairments Affecting Rehab Potential  (+) good family support (-) year since stroke    PT Frequency  2x / week    PT Duration  8 weeks    PT Treatment/Interventions  ADLs/Self Care Home Management;Cryotherapy;Electrical Stimulation;Moist Heat;Traction;Ultrasound;Therapeutic exercise;Therapeutic activities;Functional mobility training;Stair training;Gait training;DME Instruction;Balance training;Neuromuscular re-education;Patient/family education;Orthotic Fit/Training;Manual techniques;Passive range of motion;Energy conservation;Visual/perceptual remediation/compensation    PT Next Visit Plan  transfer, sts, LE strength    PT Home Exercise Plan  see sheet    Consulted and Agree with Plan of Care  Patient;Family member/caregiver    Family Member Consulted  spouse       Patient will benefit from skilled therapeutic intervention in order to improve the following deficits and impairments:  Decreased strength, Impaired UE functional use, Impaired tone, Difficulty walking, Abnormal gait, Decreased activity tolerance, Decreased balance, Decreased knowledge of precautions, Decreased endurance, Decreased coordination, Decreased knowledge of use of DME, Decreased mobility, Decreased safety awareness, Impaired perceived functional ability, Impaired flexibility, Impaired sensation, Improper body mechanics, Postural dysfunction  Visit Diagnosis: Other abnormalities of gait and mobility  Muscle weakness (generalized)  Other lack of coordination     Problem List Patient Active Problem List   Diagnosis Date Noted  . Collagen vascular disease (HCC) 05/21/2017  . 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 [place], [place], [address] Phone: [phone]   Fax:  [phone]  Name: Ariel Alvarez MRN: [id number] Date of Birth: 11-30-1945

## 2018-04-02 ENCOUNTER — Ambulatory Visit: Payer: Medicare Other

## 2018-04-04 ENCOUNTER — Ambulatory Visit: Payer: Medicare Other

## 2018-04-04 DIAGNOSIS — R2689 Other abnormalities of gait and mobility: Secondary | ICD-10-CM

## 2018-04-04 DIAGNOSIS — R278 Other lack of coordination: Secondary | ICD-10-CM

## 2018-04-04 DIAGNOSIS — M6281 Muscle weakness (generalized): Secondary | ICD-10-CM

## 2018-04-04 NOTE — Therapy (Signed)
[place] MAIN [facility] SERVICES [address] [place], [place], [address] Phone: [phone]   Fax:  [phone]  Physical Therapy Treatment  Patient Details  Name: Ariel Alvarez MRN: [id number] Date of Birth: 06/17/1946 Referring Provider: Dr. Gaetana Michaelis   Encounter Date: 04/04/2018  PT End of Session - 04/04/18 1009    Visit Number  7    Number of Visits  16    Date for PT Re-Evaluation  04/22/18    Authorization Type  8/10    PT Start Time  1000    PT Stop Time  1045    PT Time Calculation (min)  45 min    Equipment Utilized During Treatment  Gait belt    Activity Tolerance  Patient limited by fatigue    Behavior During Therapy  [facility] for tasks assessed/performed       Past Medical History:  Diagnosis Date  . Anxiety   . Collagen vascular disease (HCC)   . Depression   . Stroke [facility])     Past Surgical History:  Procedure Laterality Date  . CHOLECYSTECTOMY      There were no vitals filed for this visit.  Subjective Assessment - 04/04/18 1005    Subjective  Patient reports slight twitch in L leg that happens occasionally with colder weather. Is feeling better today but still has not been able to walk.     Patient is accompained by:  Family member    Pertinent History  Ariel Alvarez is a 72 y.o. 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 Feels like her walking and stability is decreased. Home Health PT stopped about 3 months ago.  Walked last year prior to stroke 01/2017 which then affected bilateral legs. Was in nursing home and fell out of bed and fractured L shoulder. Uses a walker and a lift chair to take a few steps. Stands at home with husband.     Limitations  Walking    How long can you sit comfortably?  n/a    How long can you stand comfortably?  5-6 minutes    How long can you walk comfortably?  4 steps    Patient Stated Goals  Patient wants to become more  independent, walk and stand better.     Currently in Pain?  No/denies        SPT with CGA and Mod verbal cueing of sequencing of feet, hemiwalker, and side stepping from WC <> Nustep   Nustep lvl 1 LE only 3 minutes   Seated hip abduction RTB single leg at a time; 10x each leg  2 sets   Seated marches with mirror for feedback alternating LE x 16 each; 3 sets ;   Seated HS curls Bil with RTB x 10; BLE; 2 sets    Seated adduction ball squeezes 10x 3 second holds 3 sets    Seated hamstring curl RTB 10x each leg  Seated LAQ 10x 3 second holds , 2 sets: LLE fatigue quicker than RLE as demonstrated with knee buckling with ambulation.   Rockerboard: df 20x, pf 20x, combined 20x  ; to improve ankle stability for balance and stability with mobility.    Pt. response to medical necessity:  Patient will continue to benefit from skilled physical therapy to improve gait mechanics, strength, and independence  PT Education - 04/04/18 1009    Education provided  Yes    Education Details  exercise technique     [name]  Patient    Methods  Explanation;Demonstration;Verbal cues    Comprehension  Verbalized understanding;Returned demonstration       PT Short Term Goals - 02/25/18 1228      PT SHORT TERM GOAL #1   Title  Patient will perform one sit to stand independently to increase independence with transfers    Baseline  4/30: Min A    Time  2    Period  Weeks    Status  New    Target Date  03/11/18      PT SHORT TERM GOAL #2   Title  Patient will be independent in home exercise program to improve strength/mobility for better functional independence with ADLs.    Baseline  4/30: exercises given    Time  2    Period  Weeks    Status  New    Target Date  03/11/18      PT SHORT TERM GOAL #3   Title  Patient will ambulate 10 ft with hemiwalker and CGA for improved home mobility    Baseline  ambulate 5 ft.     Time  2    Period   Weeks    Status  New    Target Date  03/11/18      PT SHORT TERM GOAL #4   Title  Patient will increase BLE gross strength to 4-/5 as to improve functional strength for independent gait, increased standing tolerance and increased ADL ability.    Baseline  4/30: 3/5    Time  2    Period  Weeks    Status  New    Target Date  03/11/18        PT Long Term Goals - 02/25/18 1231      PT LONG TERM GOAL #1   Title  Patient will perform 5 STS independently with hemi walker to demonstrate improved home mobility.     Baseline  4/30: require min A for one    Time  8    Period  Weeks    Status  New    Target Date  04/22/18      PT LONG TERM GOAL #2   Title  Patient will increase ABC scale score >50% to demonstrate better functional mobility and better confidence with ADLs.     Baseline  4/30: 7.5%    Time  8    Period  Weeks    Status  New    Target Date  04/22/18      PT LONG TERM GOAL #3   Title  Patient will ambulate 60 ft with hemi walker and CGA to improve home mobility and independence with gait.     Baseline  4/30: ambulate 5 ft    Time  8    Period  Weeks    Status  New    Target Date  04/22/18      PT LONG TERM GOAL #4   Title  Patient will increase BLE gross strength to 4+/5 as to improve functional strength for independent gait, increased standing tolerance and increased ADL ability    Baseline  4/30: 3/5     Time  8    Period  Weeks    Status  New    Target Date  04/22/18      PT LONG TERM GOAL #  5   Title  Patient will increase lower extremity functional scale to >30/80 to demonstrate improved functional mobility and increased tolerance with ADLs.     Baseline  4/30: 9/80    Time  8    Period  Weeks    Status  New    Target Date  04/22/18            Plan - 04/04/18 1018    Clinical Impression Statement  Patient performed stand pivot transfers requiring multiple side steps from involved LLE, Mod A verbal cueing provided for sequencing of steps and for  final eccentric portion of transfer to ensure safety of sit. Patient continues to demonstrate generalized weakness however shows slight return to PLOF today with ability to perform transfer. Patient will continue to benefit from skilled physical therapy to improve gait mechanics, strength, and independence    Rehab Potential  Fair    Clinical Impairments Affecting Rehab Potential  (+) good family support (-) year since stroke    PT Frequency  2x / week    PT Duration  8 weeks    PT Treatment/Interventions  ADLs/Self Care Home Management;Cryotherapy;Electrical Stimulation;Moist Heat;Traction;Ultrasound;Therapeutic exercise;Therapeutic activities;Functional mobility training;Stair training;Gait training;DME Instruction;Balance training;Neuromuscular re-education;Patient/family education;Orthotic Fit/Training;Manual techniques;Passive range of motion;Energy conservation;Visual/perceptual remediation/compensation    PT Next Visit Plan  transfer, sts, LE strength    PT Home Exercise Plan  see sheet    Consulted and Agree with Plan of Care  Patient;Family member/caregiver    Family Member Consulted  spouse       Patient will benefit from skilled therapeutic intervention in order to improve the following deficits and impairments:  Decreased strength, Impaired UE functional use, Impaired tone, Difficulty walking, Abnormal gait, Decreased activity tolerance, Decreased balance, Decreased knowledge of precautions, Decreased endurance, Decreased coordination, Decreased knowledge of use of DME, Decreased mobility, Decreased safety awareness, Impaired perceived functional ability, Impaired flexibility, Impaired sensation, Improper body mechanics, Postural dysfunction  Visit Diagnosis: Other abnormalities of gait and mobility  Muscle weakness (generalized)  Other lack of coordination     Problem List Patient Active Problem List   Diagnosis Date Noted  . Collagen vascular disease (HCC) 05/21/2017  .  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 MAIN [facility] SERVICES [address] [place], [place], [address] Phone: [phone]   Fax:  [phone]  Name: Ariel Alvarez MRN: [id number] Date of Birth: 1946-09-10

## 2018-04-07 ENCOUNTER — Ambulatory Visit: Payer: Medicare Other

## 2018-04-07 DIAGNOSIS — R2689 Other abnormalities of gait and mobility: Secondary | ICD-10-CM | POA: Diagnosis not present

## 2018-04-07 DIAGNOSIS — M6281 Muscle weakness (generalized): Secondary | ICD-10-CM

## 2018-04-07 DIAGNOSIS — R278 Other lack of coordination: Secondary | ICD-10-CM

## 2018-04-07 NOTE — Therapy (Signed)
[facility] MAIN [facility] SERVICES [address] [place], [place], [address] Phone: [phone]   Fax:  [phone]  Physical Therapy Treatment  Patient Details  Name: Ariel Alvarez MRN: [id number] Date of Birth: Aug 20, 1946 Referring Provider: Dr. Gaetana Michaelis   Encounter Date: 04/07/2018    Past Medical History:  Diagnosis Date  . Anxiety   . Collagen vascular disease (HCC)   . Depression   . Stroke [facility])     Past Surgical History:  Procedure Laterality Date  . CHOLECYSTECTOMY      There were no vitals filed for this visit.  Subjective Assessment - 04/07/18 1304    Subjective  Patient's husband reports walking around a little bit yesterday and standing up in kitchen. Patient reports no pain at this time but reports occasionally getting L knee pain.     Patient is accompained by:  Family member    Pertinent History  Ariel Alvarez is a 72 y.o. 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 Feels like her walking and stability is decreased. Home Health PT stopped about 3 months ago.  Walked last year prior to stroke 01/2017 which then affected bilateral legs. Was in nursing home and fell out of bed and fractured L shoulder. Uses a walker and a lift chair to take a few steps. Stands at home with husband.     Limitations  Walking    How long can you sit comfortably?  n/a    How long can you stand comfortably?  5-6 minutes    How long can you walk comfortably?  4 steps    Patient Stated Goals  Patient wants to become more independent, walk and stand better.     Currently in Pain?  No/denies     Nustep Lvl 1-2 4 minutes ; cues for keeping RPM >60 for cardiovascular capacity  SPT with CGA and Mod verbal cueing of sequencing of feet, hemiwalker, and side stepping from WC <> Nustep    TREATMENT Standing marching in // bars 10x each leg, SUE support Standing hip extension 10x each leg in  // bars SUE support.   Seated hip adduction with manual resistance x 10;; 2 sets  Seated hip abduction with manual resistance x 10;3 second holds; 2 sets Seated marches RTB 15x each leg; seated heel raises.  Seated HS curls Bil with RTB x 10;   Ambulating in gym with hemiwalker with cues to keep hemiwalker at side instead of in front and for forward gaze with mirror in front for visual cueing.Pt ambulated 2x25 ft with seated rest break in between. Turn around R side with hemiwalker and mod A for hemiwalker placement.       Pt. response to medical necessity:  Patient will continue to benefit from skilled physical therapy to improve gait mechanics, strength, and independence.                           PT Education - 04/07/18 1306    Education provided  Yes    Education Details  exercise technique     [name]  Patient    Methods  Explanation;Demonstration;Verbal cues    Comprehension  Verbalized understanding;Returned demonstration       PT Short Term Goals - 02/25/18 1228      PT SHORT TERM GOAL #1   Title  Patient will perform one sit to stand independently  to increase independence with transfers    Baseline  4/30: Min A    Time  2    Period  Weeks    Status  New    Target Date  03/11/18      PT SHORT TERM GOAL #2   Title  Patient will be independent in home exercise program to improve strength/mobility for better functional independence with ADLs.    Baseline  4/30: exercises given    Time  2    Period  Weeks    Status  New    Target Date  03/11/18      PT SHORT TERM GOAL #3   Title  Patient will ambulate 10 ft with hemiwalker and CGA for improved home mobility    Baseline  ambulate 5 ft.     Time  2    Period  Weeks    Status  New    Target Date  03/11/18      PT SHORT TERM GOAL #4   Title  Patient will increase BLE gross strength to 4-/5 as to improve functional strength for independent gait, increased standing tolerance and  increased ADL ability.    Baseline  4/30: 3/5    Time  2    Period  Weeks    Status  New    Target Date  03/11/18        PT Long Term Goals - 02/25/18 1231      PT LONG TERM GOAL #1   Title  Patient will perform 5 STS independently with hemi walker to demonstrate improved home mobility.     Baseline  4/30: require min A for one    Time  8    Period  Weeks    Status  New    Target Date  04/22/18      PT LONG TERM GOAL #2   Title  Patient will increase ABC scale score >50% to demonstrate better functional mobility and better confidence with ADLs.     Baseline  4/30: 7.5%    Time  8    Period  Weeks    Status  New    Target Date  04/22/18      PT LONG TERM GOAL #3   Title  Patient will ambulate 60 ft with hemi walker and CGA to improve home mobility and independence with gait.     Baseline  4/30: ambulate 5 ft    Time  8    Period  Weeks    Status  New    Target Date  04/22/18      PT LONG TERM GOAL #4   Title  Patient will increase BLE gross strength to 4+/5 as to improve functional strength for independent gait, increased standing tolerance and increased ADL ability    Baseline  4/30: 3/5     Time  8    Period  Weeks    Status  New    Target Date  04/22/18      PT LONG TERM GOAL #5   Title  Patient will increase lower extremity functional scale to >30/80 to demonstrate improved functional mobility and increased tolerance with ADLs.     Baseline  4/30: 9/80    Time  8    Period  Weeks    Status  New    Target Date  04/22/18            Plan - 04/07/18 1327    Clinical Impression Statement  Patient demonstrated return of strength from illness by ambulating twice ~25 ft. Patient left LE fatigued quickly in standing requiring seated rest breaks. Verbal cues required for movement of LLE.  Patient will continue to benefit from skilled physical therapy to improve gait mechanics, strength, and independence    Rehab Potential  Fair    Clinical Impairments Affecting  Rehab Potential  (+) good family support (-) year since stroke    PT Frequency  2x / week    PT Duration  8 weeks    PT Treatment/Interventions  ADLs/Self Care Home Management;Cryotherapy;Electrical Stimulation;Moist Heat;Traction;Ultrasound;Therapeutic exercise;Therapeutic activities;Functional mobility training;Stair training;Gait training;DME Instruction;Balance training;Neuromuscular re-education;Patient/family education;Orthotic Fit/Training;Manual techniques;Passive range of motion;Energy conservation;Visual/perceptual remediation/compensation    PT Next Visit Plan  transfer, sts, LE strength    PT Home Exercise Plan  see sheet    Consulted and Agree with Plan of Care  Patient;Family member/caregiver    Family Member Consulted  spouse       Patient will benefit from skilled therapeutic intervention in order to improve the following deficits and impairments:  Decreased strength, Impaired UE functional use, Impaired tone, Difficulty walking, Abnormal gait, Decreased activity tolerance, Decreased balance, Decreased knowledge of precautions, Decreased endurance, Decreased coordination, Decreased knowledge of use of DME, Decreased mobility, Decreased safety awareness, Impaired perceived functional ability, Impaired flexibility, Impaired sensation, Improper body mechanics, Postural dysfunction  Visit Diagnosis: Other abnormalities of gait and mobility  Muscle weakness (generalized)  Other lack of coordination     Problem List Patient Active Problem List   Diagnosis Date Noted  . Collagen vascular disease (HCC) 05/21/2017  . 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 [place], [place], [address] Phone: [phone]   Fax:  [phone]  Name: Ariel Alvarez MRN: [id number] Date of Birth: [birth year]

## 2018-04-09 ENCOUNTER — Ambulatory Visit: Payer: Medicare Other

## 2018-04-09 DIAGNOSIS — R2689 Other abnormalities of gait and mobility: Secondary | ICD-10-CM | POA: Diagnosis not present

## 2018-04-09 DIAGNOSIS — R278 Other lack of coordination: Secondary | ICD-10-CM

## 2018-04-09 DIAGNOSIS — M6281 Muscle weakness (generalized): Secondary | ICD-10-CM

## 2018-04-09 NOTE — Therapy (Signed)
[facility] MAIN [facility] SERVICES [address] [place], [place], [address] Phone: [phone]   Fax:  [phone]  Physical Therapy Treatment  Patient Details  Name: Ariel Alvarez MRN: [id number] Date of Birth: 12/28/1945 Referring Provider: Dr. Gaetana Michaelis   Encounter Date: 04/09/2018  PT End of Session - 04/09/18 1306    Visit Number  8    Number of Visits  16    Date for PT Re-Evaluation  04/22/18    Authorization Type  9/10    PT Start Time  1259    PT Stop Time  1345    PT Time Calculation (min)  46 min    Equipment Utilized During Treatment  Gait belt    Activity Tolerance  Patient limited by fatigue    Behavior During Therapy  [facility] for tasks assessed/performed       Past Medical History:  Diagnosis Date  . Anxiety   . Collagen vascular disease (HCC)   . Depression   . Stroke [facility])     Past Surgical History:  Procedure Laterality Date  . CHOLECYSTECTOMY      There were no vitals filed for this visit.  Subjective Assessment - 04/09/18 1304    Subjective  Patient reports going to dentists office yesterday for 3 hours so did not do any of her exercises. Reports no pain today.     Patient is accompained by:  Family member    Pertinent History  Ariel Alvarez is a 72 y.o. 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 Feels like her walking and stability is decreased. Home Health PT stopped about 3 months ago.  Walked last year prior to stroke 01/2017 which then affected bilateral legs. Was in nursing home and fell out of bed and fractured L shoulder. Uses a walker and a lift chair to take a few steps. Stands at home with husband.     Limitations  Walking    How long can you sit comfortably?  n/a    How long can you stand comfortably?  5-6 minutes    How long can you walk comfortably?  4 steps    Patient Stated Goals  Patient wants to become more independent, walk and  stand better.     Currently in Pain?  No/denies       Nustep Lvl 1-2 4 minutes ; cues for keeping RPM >60 for cardiovascular capacity   SPT with CGA and Mod verbal cueing of sequencing of feet, hemiwalker, and side stepping from WC <> Nustep     TREATMENT   Seated: 3lb ankle weight  Marches 12x each leg ; 2 sets LAQ 16x each leg; 1 sets   Seated adduction squeezes 12x 3 seconds; 2 sets    Ambulating in gym with hemiwalker with cues to keep hemiwalker at side instead of in front and for forward gaze with mirror in front for visual cueing. Pt ambulated 15 ft, 34ft, and 34ft  with seated rest break in between. Turn around R side with hemiwalker and min verbal for hemiwalker placement.    Patient educated on stroke support group.    Pt. response to medical necessity:  Patient will continue to benefit from skilled physical therapy to improve gait mechanics, strength, and independence.                            PT Education -  04/09/18 1305    Education provided  Yes    Education Details  exercise technique, ambulation, HEP compliance.     [name]  Patient    Methods  Explanation;Demonstration;Verbal cues    Comprehension  Verbalized understanding;Returned demonstration       PT Short Term Goals - 02/25/18 1228      PT SHORT TERM GOAL #1   Title  Patient will perform one sit to stand independently to increase independence with transfers    Baseline  4/30: Min A    Time  2    Period  Weeks    Status  New    Target Date  03/11/18      PT SHORT TERM GOAL #2   Title  Patient will be independent in home exercise program to improve strength/mobility for better functional independence with ADLs.    Baseline  4/30: exercises given    Time  2    Period  Weeks    Status  New    Target Date  03/11/18      PT SHORT TERM GOAL #3   Title  Patient will ambulate 10 ft with hemiwalker and CGA for improved home mobility     Baseline  ambulate 5 ft.     Time  2    Period  Weeks    Status  New    Target Date  03/11/18      PT SHORT TERM GOAL #4   Title  Patient will increase BLE gross strength to 4-/5 as to improve functional strength for independent gait, increased standing tolerance and increased ADL ability.    Baseline  4/30: 3/5    Time  2    Period  Weeks    Status  New    Target Date  03/11/18        PT Long Term Goals - 02/25/18 1231      PT LONG TERM GOAL #1   Title  Patient will perform 5 STS independently with hemi walker to demonstrate improved home mobility.     Baseline  4/30: require min A for one    Time  8    Period  Weeks    Status  New    Target Date  04/22/18      PT LONG TERM GOAL #2   Title  Patient will increase ABC scale score >50% to demonstrate better functional mobility and better confidence with ADLs.     Baseline  4/30: 7.5%    Time  8    Period  Weeks    Status  New    Target Date  04/22/18      PT LONG TERM GOAL #3   Title  Patient will ambulate 60 ft with hemi walker and CGA to improve home mobility and independence with gait.     Baseline  4/30: ambulate 5 ft    Time  8    Period  Weeks    Status  New    Target Date  04/22/18      PT LONG TERM GOAL #4   Title  Patient will increase BLE gross strength to 4+/5 as to improve functional strength for independent gait, increased standing tolerance and increased ADL ability    Baseline  4/30: 3/5     Time  8    Period  Weeks    Status  New    Target Date  04/22/18      PT LONG TERM GOAL #  5   Title  Patient will increase lower extremity functional scale to >30/80 to demonstrate improved functional mobility and increased tolerance with ADLs.     Baseline  4/30: 9/80    Time  8    Period  Weeks    Status  New    Target Date  04/22/18            Plan - 04/09/18 1332    Clinical Impression Statement  Patient challenged with task orientation requiring frequent cueing for return/continuation to task.  Patient required seated breaks between ambulation due to fatigue. Preference for keeping walker in front rather than in side required verbal cueing for correction. Coordination and counting continue to challenge patient.   Patient will continue to benefit from skilled physical therapy to improve gait mechanics, strength, and independence.    Rehab Potential  Fair    Clinical Impairments Affecting Rehab Potential  (+) good family support (-) year since stroke    PT Frequency  2x / week    PT Duration  8 weeks    PT Treatment/Interventions  ADLs/Self Care Home Management;Cryotherapy;Electrical Stimulation;Moist Heat;Traction;Ultrasound;Therapeutic exercise;Therapeutic activities;Functional mobility training;Stair training;Gait training;DME Instruction;Balance training;Neuromuscular re-education;Patient/family education;Orthotic Fit/Training;Manual techniques;Passive range of motion;Energy conservation;Visual/perceptual remediation/compensation    PT Next Visit Plan  transfer, sts, LE strength    PT Home Exercise Plan  see sheet    Consulted and Agree with Plan of Care  Patient;Family member/caregiver    Family Member Consulted  spouse       Patient will benefit from skilled therapeutic intervention in order to improve the following deficits and impairments:  Decreased strength, Impaired UE functional use, Impaired tone, Difficulty walking, Abnormal gait, Decreased activity tolerance, Decreased balance, Decreased knowledge of precautions, Decreased endurance, Decreased coordination, Decreased knowledge of use of DME, Decreased mobility, Decreased safety awareness, Impaired perceived functional ability, Impaired flexibility, Impaired sensation, Improper body mechanics, Postural dysfunction  Visit Diagnosis: Other abnormalities of gait and mobility  Muscle weakness (generalized)  Other lack of coordination     Problem List Patient Active Problem List   Diagnosis Date Noted  . Collagen vascular  disease (HCC) 05/21/2017  . 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 [place], [place], [address] Phone: [phone]   Fax:  [phone]  Name: Ariel Alvarez MRN: [id number] Date of Birth: September 02, 1946

## 2018-04-14 ENCOUNTER — Ambulatory Visit: Payer: Medicare Other

## 2018-04-14 DIAGNOSIS — R278 Other lack of coordination: Secondary | ICD-10-CM

## 2018-04-14 DIAGNOSIS — R2689 Other abnormalities of gait and mobility: Secondary | ICD-10-CM

## 2018-04-14 DIAGNOSIS — M6281 Muscle weakness (generalized): Secondary | ICD-10-CM

## 2018-04-14 NOTE — Therapy (Signed)
[place] MAIN [facility] SERVICES [address] [place], [place], [address] Phone: [phone]   Fax:  [phone]  Physical Therapy Treatment Physical Therapy Progress Note   Dates of reporting period 02/25/18  to   04/14/18   Patient Details  Name: Ariel Alvarez MRN: [id number] Date of Birth: [birth year] Referring Provider: Dr. Paula Compton   Encounter Date: 04/14/2018  PT End of Session - 04/14/18 1713    Visit Number  9    Number of Visits  32    Date for PT Re-Evaluation  06/09/18    Authorization Type  10/10 next 1/10     PT Start Time  1259    PT Stop Time  1345    PT Time Calculation (min)  46 min    Equipment Utilized During Treatment  Gait belt    Activity Tolerance  Patient limited by fatigue    Behavior During Therapy  [facility] for tasks assessed/performed       Past Medical History:  Diagnosis Date  . Anxiety   . Collagen vascular disease ([place])   . Depression   . Stroke [facility])     Past Surgical History:  Procedure Laterality Date  . CHOLECYSTECTOMY      There were no vitals filed for this visit.  Subjective Assessment - 04/14/18 1303    Subjective  Patient reports some L knee pain that is affecting her sitting and when bending her knee. Only hurts when sitting with knee bent.     Patient is accompained by:  Family member    Pertinent History  Ariel Alvarez is a 72 y.o. 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 Feels like her walking and stability is decreased. Home Health PT stopped about 3 months ago.  Walked last year prior to stroke 01/2017 which then affected bilateral legs. Was in nursing home and fell out of bed and fractured L shoulder. Uses a walker and a lift chair to take a few steps. Stands at home with husband.     Limitations  Walking    How long can you sit comfortably?  n/a    How long can you stand comfortably?  5-6 minutes    How long can you  walk comfortably?  4 steps    Patient Stated Goals  Patient wants to become more independent, walk and stand better.     Currently in Pain?  Yes    Pain Score  2     Pain Location  Knee    Pain Orientation  Left    Pain Descriptors / Indicators  Aching    Pain Type  Acute pain    Pain Onset  1 to 4 weeks ago    Pain Frequency  Intermittent    Aggravating Factors   sitting bending knee       Goal . Progress note Patient's condition has the potential to improve in response to therapy. Maximum improvement is yet to be obtained. The anticipated improvement is attainable and reasonable in a generally predictable time. Start date of reporting period 02/15/18 end date of reporting period 04/14/18. Patient reports she is improving in her mobility at home, is able to sit to stand and ambulate more than her previous baseline.    One x STS independently Ambulate 10 ft with hemiwalker and CGA BLE strength : 4-/5  5x STS: 65 seconds with min A for stability at top position last  2 sit to stands.  ABC=8% LEFS: 28 /80 60 ft hemiwalker 28 ft    seated adduction squeezes 2x12 3 second holds     Ambulating in gym with hemiwalker with cues to keep hemiwalker at side instead of in front and for forward gazewith mirror in front for visual cueing. Pt ambulated 15 ft, 14f, and 268f with seated rest break in between.Turn around R side with hemiwalker and min verbal for hemiwalker placement  Rockerboard: df 20x, pf 20x, combined 20x ; to improve ankle stability for balance and stability with mobility.   RTB hamstring curl 10x each leg,  2 sets   Pt. response to medical necessity: Patient will continue to benefit from skilled physical therapy to improve gait mechanics, strength, and independence.                    PT Education - 04/14/18 1713    Education provided  Yes    Education Details  goal progression, POC, exercise technique, ambulation     [name]  Patient     Methods  Explanation;Demonstration;Verbal cues    Comprehension  Verbalized understanding;Returned demonstration       PT Short Term Goals - 04/14/18 1320      PT SHORT TERM GOAL #1   Title  Patient will perform one sit to stand independently to increase independence with transfers    Baseline  4/30: Min A 6/17: able to perform     Time  2    Period  Weeks    Status  Achieved      PT SHORT TERM GOAL #2   Title  Patient will be independent in home exercise program to improve strength/mobility for better functional independence with ADLs.    Baseline  4/30: exercises given 6/17: doing HEP 3x/week     Time  2    Period  Weeks    Status  Partially Met      PT SHORT TERM GOAL #3   Title  Patient will ambulate 10 ft with hemiwalker and CGA for improved home mobility    Baseline  ambulate 5 ft.  6/17: 28 ft     Time  2    Period  Weeks    Status  Achieved      PT SHORT TERM GOAL #4   Title  Patient will increase BLE gross strength to 4-/5 as to improve functional strength for independent gait, increased standing tolerance and increased ADL ability.    Baseline  4/30: 3/5 6/17: 4-/10     Time  2    Period  Weeks    Status  Achieved        PT Long Term Goals - 04/14/18 1321      PT LONG TERM GOAL #1   Title  Patient will perform 5 STS < 20 seconds independently with hemi walker to demonstrate improved home mobility.     Baseline  4/30: require min A for one 6/17: 5x STS: 65 seconds with min A for stability at top position last 2 sit to stands.     Time  8    Period  Weeks    Status  Partially Met    Target Date  06/09/18      PT LONG TERM GOAL #2   Title  Patient will increase ABC scale score >50% to demonstrate better functional mobility and better confidence with ADLs.     Baseline  4/30: 7.5% 6/17: 8%  Time  8    Period  Weeks    Status  Partially Met    Target Date  06/09/18      PT LONG TERM GOAL #3   Title  Patient will ambulate 60 ft with hemi walker and CGA  to improve home mobility and independence with gait.     Baseline  4/30: ambulate 5 ft; 6/17P 28 ft with CGA and hemiwalker    Time  8    Period  Weeks    Status  Partially Met    Target Date  06/09/18      PT LONG TERM GOAL #4   Title  Patient will increase BLE gross strength to 4+/5 as to improve functional strength for independent gait, increased standing tolerance and increased ADL ability    Baseline  4/30: 3/5  6/17 : 4-/5    Time  8    Period  Weeks    Status  Partially Met    Target Date  06/09/18      PT LONG TERM GOAL #5   Title  Patient will increase lower extremity functional scale to >30/80 to demonstrate improved functional mobility and increased tolerance with ADLs.     Baseline  4/30: 9/80 6/17: 28/80    Time  8    Period  Weeks    Status  Partially Met    Target Date  06/09/18            Plan - 04/14/18 1715    Clinical Impression Statement  Patient progressing with all goals at this time. Improved LE strength improved to gross 4-5. Patient is able to perform more independent STS due to increased LE strength allowing more independent mobility. 5x STS requires excessive time due to patient's limited capcity for muscle activation. Ambulation continues to improve in duration and velocity with patient utilizing hemiwalker.  Patient's condition has the potential to improve in response to therapy. Maximum improvement is yet to be obtained. The anticipated improvement is attainable and reasonable in a generally predictable time.Patient will continue to benefit from skilled physical therapy to improve gait mechanics, strength, and independence.    Rehab Potential  Fair    Clinical Impairments Affecting Rehab Potential  (+) good family support (-) year since stroke    PT Frequency  2x / week    PT Duration  8 weeks    PT Treatment/Interventions  ADLs/Self Care Home Management;Cryotherapy;Electrical Stimulation;Moist Heat;Traction;Ultrasound;Therapeutic  exercise;Therapeutic activities;Functional mobility training;Stair training;Gait training;DME Instruction;Balance training;Neuromuscular re-education;Patient/family education;Orthotic Fit/Training;Manual techniques;Passive range of motion;Energy conservation;Visual/perceptual remediation/compensation    PT Next Visit Plan  transfer, sts, LE strength    PT Home Exercise Plan  see sheet    Consulted and Agree with Plan of Care  Patient;Family member/caregiver    Family Member Consulted  spouse       Patient will benefit from skilled therapeutic intervention in order to improve the following deficits and impairments:  Decreased strength, Impaired UE functional use, Impaired tone, Difficulty walking, Abnormal gait, Decreased activity tolerance, Decreased balance, Decreased knowledge of precautions, Decreased endurance, Decreased coordination, Decreased knowledge of use of DME, Decreased mobility, Decreased safety awareness, Impaired perceived functional ability, Impaired flexibility, Impaired sensation, Improper body mechanics, Postural dysfunction  Visit Diagnosis: Other abnormalities of gait and mobility  Muscle weakness (generalized)  Other lack of coordination     Problem List Patient Active Problem List   Diagnosis Date Noted  . Collagen vascular disease ([place]) 05/21/2017  . 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

## 2018-04-16 ENCOUNTER — Ambulatory Visit: Payer: Medicare Other

## 2018-04-16 DIAGNOSIS — M6281 Muscle weakness (generalized): Secondary | ICD-10-CM

## 2018-04-16 DIAGNOSIS — R278 Other lack of coordination: Secondary | ICD-10-CM

## 2018-04-16 DIAGNOSIS — R2689 Other abnormalities of gait and mobility: Secondary | ICD-10-CM | POA: Diagnosis not present

## 2018-04-16 NOTE — Therapy (Signed)
[place] MAIN [facility] SERVICES [address] [place], [place], [address] Phone: [phone]   Fax:  [phone]  Physical Therapy Treatment  Patient Details  Name: Ariel Alvarez MRN: [id number] Date of Birth: 1946/06/24 Referring Provider: Dr. Paula Compton   Encounter Date: 04/16/2018  PT End of Session - 04/16/18 1253    Visit Number  10    Number of Visits  32    Date for PT Re-Evaluation  06/09/18    Authorization Type  1/10    PT Start Time  1259    PT Stop Time  1344    PT Time Calculation (min)  45 min    Equipment Utilized During Treatment  Gait belt    Activity Tolerance  Patient limited by fatigue    Behavior During Therapy  [facility] for tasks assessed/performed       Past Medical History:  Diagnosis Date  . Anxiety   . Collagen vascular disease ([place])   . Depression   . Stroke [facility])     Past Surgical History:  Procedure Laterality Date  . CHOLECYSTECTOMY      There were no vitals filed for this visit.  Subjective Assessment - 04/16/18 1305    Subjective  Patient reports legs are fatigued today due to working out this morning in seated position. Did not walk this morning due to husband not being home.     Patient is accompained by:  Family member    Pertinent History  Ariel Alvarez is a 73 y.o. 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 Feels like her walking and stability is decreased. Home Health PT stopped about 3 months ago.  Walked last year prior to stroke 01/2017 which then affected bilateral legs. Was in nursing home and fell out of bed and fractured L shoulder. Uses a walker and a lift chair to take a few steps. Stands at home with husband.     Limitations  Walking    How long can you sit comfortably?  n/a    How long can you stand comfortably?  5-6 minutes    How long can you walk comfortably?  4 steps    Patient Stated Goals  Patient wants to become  more independent, walk and stand better.     Currently in Pain?  No/denies       Nustep Lvl 1-2 4 minutes ; cues for keeping RPM >60 for cardiovascular capacity   SPT with CGA and Mod verbal cueing of sequencing of feet, hemiwalker, and side stepping from WC <> Nustep     TREATMENT   Seated: 3lb ankle weight               Marches 15x each leg ; 2 sets             LAQ 15x each leg; 2 sets    Seated adduction squeezes 15x 3 seconds; 2 sets   Seated hamstring stretch ; bilateral LE 2x40 seconds  Seated soccer ball kicks 10x each leg (LAQ) ; PT kick ball to patient with patient returning kick to improve reflexes and strength simultaneously.   Seated soccer ball kicks (LAQ) between "goals" (two cones) 10x each leg    Ambulating in gym with hemiwalker with cues to keep hemiwalker at side instead of in front and for forward gaze with mirror in front for visual cueing. Pt ambulated 17 ft, 76f, and 346f with seated  rest break in between. Turn around R side with hemiwalker and min verbal for hemiwalker placement.    Patient educated on stroke support group.    Pt. response to medical necessity:  Patient will continue to benefit from skilled physical therapy to improve gait mechanics, strength, and independence                         PT Education - 04/16/18 1306    Education provided  Yes    Education Details  exercise technique, ambulation     [name]  Patient    Methods  Explanation;Demonstration;Verbal cues    Comprehension  Verbalized understanding;Returned demonstration       PT Short Term Goals - 04/14/18 1320      PT SHORT TERM GOAL #1   Title  Patient will perform one sit to stand independently to increase independence with transfers    Baseline  4/30: Min A 6/17: able to perform     Time  2    Period  Weeks    Status  Achieved      PT SHORT TERM GOAL #2   Title  Patient will be independent in home exercise program to improve  strength/mobility for better functional independence with ADLs.    Baseline  4/30: exercises given 6/17: doing HEP 3x/week     Time  2    Period  Weeks    Status  Partially Met      PT SHORT TERM GOAL #3   Title  Patient will ambulate 10 ft with hemiwalker and CGA for improved home mobility    Baseline  ambulate 5 ft.  6/17: 28 ft     Time  2    Period  Weeks    Status  Achieved      PT SHORT TERM GOAL #4   Title  Patient will increase BLE gross strength to 4-/5 as to improve functional strength for independent gait, increased standing tolerance and increased ADL ability.    Baseline  4/30: 3/5 6/17: 4-/10     Time  2    Period  Weeks    Status  Achieved        PT Long Term Goals - 04/14/18 1321      PT LONG TERM GOAL #1   Title  Patient will perform 5 STS < 20 seconds independently with hemi walker to demonstrate improved home mobility.     Baseline  4/30: require min A for one 6/17: 5x STS: 65 seconds with min A for stability at top position last 2 sit to stands.     Time  8    Period  Weeks    Status  Partially Met    Target Date  06/09/18      PT LONG TERM GOAL #2   Title  Patient will increase ABC scale score >50% to demonstrate better functional mobility and better confidence with ADLs.     Baseline  4/30: 7.5% 6/17: 8%    Time  8    Period  Weeks    Status  Partially Met    Target Date  06/09/18      PT LONG TERM GOAL #3   Title  Patient will ambulate 60 ft with hemi walker and CGA to improve home mobility and independence with gait.     Baseline  4/30: ambulate 5 ft; 6/17P 28 ft with CGA and hemiwalker    Time  8  Period  Weeks    Status  Partially Met    Target Date  06/09/18      PT LONG TERM GOAL #4   Title  Patient will increase BLE gross strength to 4+/5 as to improve functional strength for independent gait, increased standing tolerance and increased ADL ability    Baseline  4/30: 3/5  6/17 : 4-/5    Time  8    Period  Weeks    Status  Partially  Met    Target Date  06/09/18      PT LONG TERM GOAL #5   Title  Patient will increase lower extremity functional scale to >30/80 to demonstrate improved functional mobility and increased tolerance with ADLs.     Baseline  4/30: 9/80 6/17: 28/80    Time  8    Period  Weeks    Status  Partially Met    Target Date  06/09/18            Plan - 04/16/18 1322    Clinical Impression Statement  Patient fatigued more quickly this session requiring seated strengthening interventions to be performed between walking. Patient continues to require verbal cueing for proper placement of hemiwalker due to preference to place in front of self rather than to the side.  Patient will continue to benefit from skilled physical therapy to improve gait mechanics, strength, and independence    Rehab Potential  Fair    Clinical Impairments Affecting Rehab Potential  (+) good family support (-) year since stroke    PT Frequency  2x / week    PT Duration  8 weeks    PT Treatment/Interventions  ADLs/Self Care Home Management;Cryotherapy;Electrical Stimulation;Moist Heat;Traction;Ultrasound;Therapeutic exercise;Therapeutic activities;Functional mobility training;Stair training;Gait training;DME Instruction;Balance training;Neuromuscular re-education;Patient/family education;Orthotic Fit/Training;Manual techniques;Passive range of motion;Energy conservation;Visual/perceptual remediation/compensation    PT Next Visit Plan  transfer, sts, LE strength    PT Home Exercise Plan  see sheet    Consulted and Agree with Plan of Care  Patient;Family member/caregiver    Family Member Consulted  spouse       Patient will benefit from skilled therapeutic intervention in order to improve the following deficits and impairments:  Decreased strength, Impaired UE functional use, Impaired tone, Difficulty walking, Abnormal gait, Decreased activity tolerance, Decreased balance, Decreased knowledge of precautions, Decreased endurance,  Decreased coordination, Decreased knowledge of use of DME, Decreased mobility, Decreased safety awareness, Impaired perceived functional ability, Impaired flexibility, Impaired sensation, Improper body mechanics, Postural dysfunction  Visit Diagnosis: Other abnormalities of gait and mobility  Muscle weakness (generalized)  Other lack of coordination     Problem List Patient Active Problem List   Diagnosis Date Noted  . Collagen vascular disease ([place]) 05/21/2017  . Gangrene of finger of right hand ([place]) 05/21/2017   Janna Arch, PT, DPT   04/16/2018, 1:51 PM  [place] MAIN [facility] SERVICES [address] [place], [place], [address] Phone: [phone]   Fax:  [phone]  Name: Ariel Alvarez MRN: [id number] Date of Birth: [birth year]

## 2018-04-21 ENCOUNTER — Ambulatory Visit: Payer: Medicare Other

## 2018-04-21 DIAGNOSIS — R2689 Other abnormalities of gait and mobility: Secondary | ICD-10-CM

## 2018-04-21 DIAGNOSIS — R278 Other lack of coordination: Secondary | ICD-10-CM

## 2018-04-21 DIAGNOSIS — M6281 Muscle weakness (generalized): Secondary | ICD-10-CM

## 2018-04-21 NOTE — Therapy (Signed)
[place] MAIN [facility] SERVICES [address] [place], [place], [address] Phone: [phone]   Fax:  [phone]  Physical Therapy Treatment  Patient Details  Name: Ariel Alvarez MRN: [id number] Date of Birth: [birth year] Referring Provider: Dr. Paula Compton   Encounter Date: 04/21/2018  PT End of Session - 04/21/18 1254    Visit Number  11    Number of Visits  32    Date for PT Re-Evaluation  06/09/18    Authorization Type  2/10    PT Start Time  1259    PT Stop Time  1344    PT Time Calculation (min)  45 min    Equipment Utilized During Treatment  Gait belt    Activity Tolerance  Patient limited by fatigue    Behavior During Therapy  [facility] for tasks assessed/performed       Past Medical History:  Diagnosis Date  . Anxiety   . Collagen vascular disease ([place])   . Depression   . Stroke [facility])     Past Surgical History:  Procedure Laterality Date  . CHOLECYSTECTOMY      There were no vitals filed for this visit.  Subjective Assessment - 04/21/18 1252    Subjective  Patient reports doing her exercises at home and dusting over the weekend.  No complaints or concerns. Arrived early for PT session.     Patient is accompained by:  Family member    Pertinent History  Ariel Alvarez is a 72 y.o. 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 Feels like her walking and stability is decreased. Home Health PT stopped about 3 months ago.  Walked last year prior to stroke 01/2017 which then affected bilateral legs. Was in nursing home and fell out of bed and fractured L shoulder. Uses a walker and a lift chair to take a few steps. Stands at home with husband.     Limitations  Walking    How long can you sit comfortably?  n/a    How long can you stand comfortably?  5-6 minutes    How long can you walk comfortably?  4 steps    Patient Stated Goals  Patient wants to become more independent,  walk and stand better.     Currently in Pain?  No/denies       Nustep Lvl 1-2 4 minutes ; cues for keeping RPM >60 for cardiovascular capacity; 2 minutes ; stopped due fatigue.    SPT with CGA and Mod verbal cueing of sequencing of feet, hemiwalker, and side stepping from WC <> Nustep      TREATMENT      Seated adduction squeezes 15x 3 seconds; 2 sets   Heel toe raises 15x seated    Seated hamstring stretch ; bilateral LE 2x40 seconds   Gluteal squeezes seated position 17x 2 second holds   Seated soccer ball kicks 10x each leg (LAQ) ; PT kick ball to patient with patient returning kick to improve reflexes and strength simultaneously.     Seated soccer ball kicks (LAQ) between "goals" (two cones) 10x each leg    Ambulating in hallway with hemiwalker with cues to keep hemiwalker at side instead of in front and for forward gaze with mirror in front for visual cueing. Pt ambulated 20 ft, 25f, and 279f with seated rest break in between. Turn around R side with hemiwalker and min verbal for hemiwalker placement.  Patient educated on stroke support group.    Pt. response to medical necessity:  Patient will continue to benefit from skilled physical therapy to improve gait mechanics, strength, and independence                          PT Education - 04/21/18 1305    Education provided  Yes    Education Details  exercise technique, ambulation     [name]  Patient    Methods  Explanation;Demonstration;Verbal cues    Comprehension  Verbalized understanding;Returned demonstration       PT Short Term Goals - 04/14/18 1320      PT SHORT TERM GOAL #1   Title  Patient will perform one sit to stand independently to increase independence with transfers    Baseline  4/30: Min A 6/17: able to perform     Time  2    Period  Weeks    Status  Achieved      PT SHORT TERM GOAL #2   Title  Patient will be independent in home exercise program to improve  strength/mobility for better functional independence with ADLs.    Baseline  4/30: exercises given 6/17: doing HEP 3x/week     Time  2    Period  Weeks    Status  Partially Met      PT SHORT TERM GOAL #3   Title  Patient will ambulate 10 ft with hemiwalker and CGA for improved home mobility    Baseline  ambulate 5 ft.  6/17: 28 ft     Time  2    Period  Weeks    Status  Achieved      PT SHORT TERM GOAL #4   Title  Patient will increase BLE gross strength to 4-/5 as to improve functional strength for independent gait, increased standing tolerance and increased ADL ability.    Baseline  4/30: 3/5 6/17: 4-/10     Time  2    Period  Weeks    Status  Achieved        PT Long Term Goals - 04/14/18 1321      PT LONG TERM GOAL #1   Title  Patient will perform 5 STS < 20 seconds independently with hemi walker to demonstrate improved home mobility.     Baseline  4/30: require min A for one 6/17: 5x STS: 65 seconds with min A for stability at top position last 2 sit to stands.     Time  8    Period  Weeks    Status  Partially Met    Target Date  06/09/18      PT LONG TERM GOAL #2   Title  Patient will increase ABC scale score >50% to demonstrate better functional mobility and better confidence with ADLs.     Baseline  4/30: 7.5% 6/17: 8%    Time  8    Period  Weeks    Status  Partially Met    Target Date  06/09/18      PT LONG TERM GOAL #3   Title  Patient will ambulate 60 ft with hemi walker and CGA to improve home mobility and independence with gait.     Baseline  4/30: ambulate 5 ft; 6/17P 28 ft with CGA and hemiwalker    Time  8    Period  Weeks    Status  Partially Met    Target Date  06/09/18      PT LONG TERM GOAL #4   Title  Patient will increase BLE gross strength to 4+/5 as to improve functional strength for independent gait, increased standing tolerance and increased ADL ability    Baseline  4/30: 3/5  6/17 : 4-/5    Time  8    Period  Weeks    Status  Partially  Met    Target Date  06/09/18      PT LONG TERM GOAL #5   Title  Patient will increase lower extremity functional scale to >30/80 to demonstrate improved functional mobility and increased tolerance with ADLs.     Baseline  4/30: 9/80 6/17: 28/80    Time  8    Period  Weeks    Status  Partially Met    Target Date  06/09/18            Plan - 04/21/18 1340    Clinical Impression Statement  Patient demonstrated improved capacity for ambulation, ambulating multiple sets of 20 ft with hemiwalker and CGA. Patient demonstrates improved coordination, reflexes and strength with increased accuracy during soccer kicking game.  Patient will continue to benefit from skilled physical therapy to improve gait mechanics, strength, and independence    Rehab Potential  Fair    Clinical Impairments Affecting Rehab Potential  (+) good family support (-) year since stroke    PT Frequency  2x / week    PT Duration  8 weeks    PT Treatment/Interventions  ADLs/Self Care Home Management;Cryotherapy;Electrical Stimulation;Moist Heat;Traction;Ultrasound;Therapeutic exercise;Therapeutic activities;Functional mobility training;Stair training;Gait training;DME Instruction;Balance training;Neuromuscular re-education;Patient/family education;Orthotic Fit/Training;Manual techniques;Passive range of motion;Energy conservation;Visual/perceptual remediation/compensation    PT Next Visit Plan  transfer, sts, LE strength    PT Home Exercise Plan  see sheet    Consulted and Agree with Plan of Care  Patient;Family member/caregiver    Family Member Consulted  spouse       Patient will benefit from skilled therapeutic intervention in order to improve the following deficits and impairments:  Decreased strength, Impaired UE functional use, Impaired tone, Difficulty walking, Abnormal gait, Decreased activity tolerance, Decreased balance, Decreased knowledge of precautions, Decreased endurance, Decreased coordination, Decreased  knowledge of use of DME, Decreased mobility, Decreased safety awareness, Impaired perceived functional ability, Impaired flexibility, Impaired sensation, Improper body mechanics, Postural dysfunction  Visit Diagnosis: Other abnormalities of gait and mobility  Muscle weakness (generalized)  Other lack of coordination     Problem List Patient Active Problem List   Diagnosis Date Noted  . Collagen vascular disease ([place]) 05/21/2017  . 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 [place], [place], [address] Phone: [phone]   Fax:  [phone]  Name: Ariel Alvarez MRN: [id number] Date of Birth: Aug 22, 1946

## 2018-04-23 ENCOUNTER — Ambulatory Visit: Payer: Medicare Other

## 2018-04-24 ENCOUNTER — Ambulatory Visit: Payer: Medicare Other

## 2018-04-30 ENCOUNTER — Ambulatory Visit: Payer: Medicare Other | Attending: Internal Medicine

## 2018-04-30 DIAGNOSIS — M6281 Muscle weakness (generalized): Secondary | ICD-10-CM | POA: Diagnosis present

## 2018-04-30 DIAGNOSIS — R278 Other lack of coordination: Secondary | ICD-10-CM

## 2018-04-30 DIAGNOSIS — R2689 Other abnormalities of gait and mobility: Secondary | ICD-10-CM | POA: Diagnosis not present

## 2018-04-30 NOTE — Therapy (Signed)
[place] MAIN [facility] SERVICES [address] [place], [place], [address] Phone: [phone]   Fax:  [phone]  Physical Therapy Treatment  Patient Details  Name: Ariel Alvarez MRN: [id number] Date of Birth: 08/14/1946 Referring Provider: Dr. Paula Compton   Encounter Date: 04/30/2018  PT End of Session - 04/30/18 1306    Visit Number  12    Number of Visits  32    Date for PT Re-Evaluation  06/09/18    Authorization Type  3/10    PT Start Time  1259    PT Stop Time  1345    PT Time Calculation (min)  46 min    Equipment Utilized During Treatment  Gait belt    Activity Tolerance  Patient limited by fatigue    Behavior During Therapy  [facility] for tasks assessed/performed       Past Medical History:  Diagnosis Date  . Anxiety   . Collagen vascular disease ([place])   . Depression   . Stroke [facility])     Past Surgical History:  Procedure Laterality Date  . CHOLECYSTECTOMY      There were no vitals filed for this visit.  Subjective Assessment - 04/30/18 1305    Subjective  Patient reports she has been walking at home except for this morning because she took a nap insteady today. Is wearing new glasses today.     Patient is accompained by:  Family member    Pertinent History  Ariel Alvarez is a 72 y.o. 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 Feels like her walking and stability is decreased. Home Health PT stopped about 3 months ago.  Walked last year prior to stroke 01/2017 which then affected bilateral legs. Was in nursing home and fell out of bed and fractured L shoulder. Uses a walker and a lift chair to take a few steps. Stands at home with husband.     Limitations  Walking    How long can you sit comfortably?  n/a    How long can you stand comfortably?  5-6 minutes    How long can you walk comfortably?  4 steps    Patient Stated Goals  Patient wants to become more  independent, walk and stand better.     Currently in Pain?  No/denies       Nustep Lvl 1-2 4 minutes ; cues for keeping RPM >60 for cardiovascular capacity; 2 minutes ; stopped due fatigue.    SPT with CGA and Mod verbal cueing of sequencing of feet, hemiwalker, and side stepping from WC <> Nustep       TREATMENT       Seated adduction squeezes 15x 3 seconds; 2 sets    Heel toe raises 15x seated    Seated hamstring stretch ; bilateral LE 2x40 seconds   Gluteal squeezes seated position 15x 2 second holds    Seated soccer ball kicks 10x each leg (LAQ) ; PT kick ball to patient with patient returning kick to improve reflexes and strength simultaneously.      Seated soccer ball kicks (LAQ) between "goals" (two cones) 10x each leg    Ambulating in hallway with hemiwalker with cues to keep hemiwalker at side instead of in front . Pt ambulated 15 ft, 28f, and 133f with seated rest break in between. Turn around R side with hemiwalker and min verbal for hemiwalker placement.   Seated prostretch 2x60  seconds BLE due to calf tightness/pain with ambulation   Patient educated on stroke support group.    Pt. response to medical necessity:  Patient will continue to benefit from skilled physical therapy to improve gait mechanics, strength, and independence                          PT Education - 04/30/18 1306    Education provided  Yes    Education Details  exercise technique, ambulation    [name]  Patient    Methods  Explanation;Demonstration;Verbal cues    Comprehension  Verbalized understanding;Returned demonstration       PT Short Term Goals - 04/14/18 1320      PT SHORT TERM GOAL #1   Title  Patient will perform one sit to stand independently to increase independence with transfers    Baseline  4/30: Min A 6/17: able to perform     Time  2    Period  Weeks    Status  Achieved      PT SHORT TERM GOAL #2   Title  Patient will be  independent in home exercise program to improve strength/mobility for better functional independence with ADLs.    Baseline  4/30: exercises given 6/17: doing HEP 3x/week     Time  2    Period  Weeks    Status  Partially Met      PT SHORT TERM GOAL #3   Title  Patient will ambulate 10 ft with hemiwalker and CGA for improved home mobility    Baseline  ambulate 5 ft.  6/17: 28 ft     Time  2    Period  Weeks    Status  Achieved      PT SHORT TERM GOAL #4   Title  Patient will increase BLE gross strength to 4-/5 as to improve functional strength for independent gait, increased standing tolerance and increased ADL ability.    Baseline  4/30: 3/5 6/17: 4-/10     Time  2    Period  Weeks    Status  Achieved        PT Long Term Goals - 04/14/18 1321      PT LONG TERM GOAL #1   Title  Patient will perform 5 STS < 20 seconds independently with hemi walker to demonstrate improved home mobility.     Baseline  4/30: require min A for one 6/17: 5x STS: 65 seconds with min A for stability at top position last 2 sit to stands.     Time  8    Period  Weeks    Status  Partially Met    Target Date  06/09/18      PT LONG TERM GOAL #2   Title  Patient will increase ABC scale score >50% to demonstrate better functional mobility and better confidence with ADLs.     Baseline  4/30: 7.5% 6/17: 8%    Time  8    Period  Weeks    Status  Partially Met    Target Date  06/09/18      PT LONG TERM GOAL #3   Title  Patient will ambulate 60 ft with hemi walker and CGA to improve home mobility and independence with gait.     Baseline  4/30: ambulate 5 ft; 6/17P 28 ft with CGA and hemiwalker    Time  8    Period  Weeks  Status  Partially Met    Target Date  06/09/18      PT LONG TERM GOAL #4   Title  Patient will increase BLE gross strength to 4+/5 as to improve functional strength for independent gait, increased standing tolerance and increased ADL ability    Baseline  4/30: 3/5  6/17 : 4-/5     Time  8    Period  Weeks    Status  Partially Met    Target Date  06/09/18      PT LONG TERM GOAL #5   Title  Patient will increase lower extremity functional scale to >30/80 to demonstrate improved functional mobility and increased tolerance with ADLs.     Baseline  4/30: 9/80 6/17: 28/80    Time  8    Period  Weeks    Status  Partially Met    Target Date  06/09/18            Plan - 04/30/18 1335    Clinical Impression Statement  Patient limited in ambulation distances today due to c/o of calf pain/tightness. Prolonged contraction of muscles challenging to patient with patient "fluttering" contraction rather than performing prolonged hold.  Patient will continue to benefit from skilled physical therapy to improve gait mechanics, strength, and independence    Rehab Potential  Fair    Clinical Impairments Affecting Rehab Potential  (+) good family support (-) year since stroke    PT Frequency  2x / week    PT Duration  8 weeks    PT Treatment/Interventions  ADLs/Self Care Home Management;Cryotherapy;Electrical Stimulation;Moist Heat;Traction;Ultrasound;Therapeutic exercise;Therapeutic activities;Functional mobility training;Stair training;Gait training;DME Instruction;Balance training;Neuromuscular re-education;Patient/family education;Orthotic Fit/Training;Manual techniques;Passive range of motion;Energy conservation;Visual/perceptual remediation/compensation    PT Next Visit Plan  transfer, sts, LE strength    PT Home Exercise Plan  see sheet    Consulted and Agree with Plan of Care  Patient;Family member/caregiver    Family Member Consulted  spouse       Patient will benefit from skilled therapeutic intervention in order to improve the following deficits and impairments:  Decreased strength, Impaired UE functional use, Impaired tone, Difficulty walking, Abnormal gait, Decreased activity tolerance, Decreased balance, Decreased knowledge of precautions, Decreased endurance,  Decreased coordination, Decreased knowledge of use of DME, Decreased mobility, Decreased safety awareness, Impaired perceived functional ability, Impaired flexibility, Impaired sensation, Improper body mechanics, Postural dysfunction  Visit Diagnosis: Other abnormalities of gait and mobility  Muscle weakness (generalized)  Other lack of coordination     Problem List Patient Active Problem List   Diagnosis Date Noted  . Collagen vascular disease ([place]) 05/21/2017  . 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 [place], [place], [address] Phone: [phone]   Fax:  [phone]  Name: LIZANNE ERKER MRN: [id number] Date of Birth: [birth year]

## 2018-05-05 ENCOUNTER — Ambulatory Visit: Payer: Medicare Other

## 2018-05-05 DIAGNOSIS — M6281 Muscle weakness (generalized): Secondary | ICD-10-CM

## 2018-05-05 DIAGNOSIS — R2689 Other abnormalities of gait and mobility: Secondary | ICD-10-CM | POA: Diagnosis not present

## 2018-05-05 DIAGNOSIS — R278 Other lack of coordination: Secondary | ICD-10-CM

## 2018-05-05 NOTE — Therapy (Signed)
[place] MAIN [facility] SERVICES [address] [place], [place], [address] Phone: [phone]   Fax:  [phone]  Physical Therapy Treatment  Patient Details  Name: Ariel Alvarez MRN: [id number] Date of Birth: May 12, 1946 Referring Provider: Dr. Paula Compton   Encounter Date: 05/05/2018  PT End of Session - 05/05/18 1322    Visit Number  13    Number of Visits  32    Date for PT Re-Evaluation  06/09/18    Authorization Type  4/10    PT Start Time  5974    PT Stop Time  1345    PT Time Calculation (min)  40 min    Equipment Utilized During Treatment  Gait belt    Activity Tolerance  Patient limited by fatigue    Behavior During Therapy  [facility] for tasks assessed/performed       Past Medical History:  Diagnosis Date  . Anxiety   . Collagen vascular disease ([place])   . Depression   . Stroke [facility])     Past Surgical History:  Procedure Laterality Date  . CHOLECYSTECTOMY      There were no vitals filed for this visit.  Subjective Assessment - 05/05/18 1312    Subjective  Patient arrived late due to needing to take a Lucianne Lei for first time today. Is feeling upset/frantic due to being late.     Patient is accompained by:  Family member    Pertinent History  Ariel Alvarez is a 72 y.o. 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 Feels like her walking and stability is decreased. Home Health PT stopped about 3 months ago.  Walked last year prior to stroke 01/2017 which then affected bilateral legs. Was in nursing home and fell out of bed and fractured L shoulder. Uses a walker and a lift chair to take a few steps. Stands at home with husband.     Limitations  Walking    How long can you sit comfortably?  n/a    How long can you stand comfortably?  5-6 minutes    How long can you walk comfortably?  4 steps    Patient Stated Goals  Patient wants to become more independent, walk and stand  better.     Currently in Pain?  No/denies       TREATMENT Standing marching in // bars 10x each leg, SUE support; 2 sets : tactile support to L quadriceps to maintain knee extension   Standing hip extension 10x each leg in // bars SUE support. ; 2 sets   Standing abduction 10x each leg,  SUE support  Ambulating backwards 3 ft x 6 trials in // bars with max verbal cueing for moving L foot. Required 2 steps for L for every 1 step R initially with improved equality of steps with repetition.    Seated hip adduction with manual resistance x 10; 3 second hold ; 2 sets  Seated gluteal squeezes 10x 3 second holds ; 2 sets  Seated marches 15x each leg; seated heel raises.  Seated HS curls Bil with RTB x 10;          Pt. response to medical necessity:  Patient will continue to benefit from skilled physical therapy to improve gait mechanics, strength, and independence                         PT Education -  05/05/18 1313    Education provided  Yes    Education Details  exercise technique     [name]  Patient    Methods  Explanation;Demonstration;Verbal cues    Comprehension  Verbalized understanding;Returned demonstration       PT Short Term Goals - 04/14/18 1320      PT SHORT TERM GOAL #1   Title  Patient will perform one sit to stand independently to increase independence with transfers    Baseline  4/30: Min A 6/17: able to perform     Time  2    Period  Weeks    Status  Achieved      PT SHORT TERM GOAL #2   Title  Patient will be independent in home exercise program to improve strength/mobility for better functional independence with ADLs.    Baseline  4/30: exercises given 6/17: doing HEP 3x/week     Time  2    Period  Weeks    Status  Partially Met      PT SHORT TERM GOAL #3   Title  Patient will ambulate 10 ft with hemiwalker and CGA for improved home mobility    Baseline  ambulate 5 ft.  6/17: 28 ft     Time  2    Period  Weeks     Status  Achieved      PT SHORT TERM GOAL #4   Title  Patient will increase BLE gross strength to 4-/5 as to improve functional strength for independent gait, increased standing tolerance and increased ADL ability.    Baseline  4/30: 3/5 6/17: 4-/10     Time  2    Period  Weeks    Status  Achieved        PT Long Term Goals - 04/14/18 1321      PT LONG TERM GOAL #1   Title  Patient will perform 5 STS < 20 seconds independently with hemi walker to demonstrate improved home mobility.     Baseline  4/30: require min A for one 6/17: 5x STS: 65 seconds with min A for stability at top position last 2 sit to stands.     Time  8    Period  Weeks    Status  Partially Met    Target Date  06/09/18      PT LONG TERM GOAL #2   Title  Patient will increase ABC scale score >50% to demonstrate better functional mobility and better confidence with ADLs.     Baseline  4/30: 7.5% 6/17: 8%    Time  8    Period  Weeks    Status  Partially Met    Target Date  06/09/18      PT LONG TERM GOAL #3   Title  Patient will ambulate 60 ft with hemi walker and CGA to improve home mobility and independence with gait.     Baseline  4/30: ambulate 5 ft; 6/17P 28 ft with CGA and hemiwalker    Time  8    Period  Weeks    Status  Partially Met    Target Date  06/09/18      PT LONG TERM GOAL #4   Title  Patient will increase BLE gross strength to 4+/5 as to improve functional strength for independent gait, increased standing tolerance and increased ADL ability    Baseline  4/30: 3/5  6/17 : 4-/5    Time  8    Period  Weeks    Status  Partially Met    Target Date  06/09/18      PT LONG TERM GOAL #5   Title  Patient will increase lower extremity functional scale to >30/80 to demonstrate improved functional mobility and increased tolerance with ADLs.     Baseline  4/30: 9/80 6/17: 28/80    Time  8    Period  Weeks    Status  Partially Met    Target Date  06/09/18            Plan - 05/05/18 1350     Clinical Impression Statement  Patient challenged maintaining SLS on LLE due to poor musculature control and strength, occasionally requiring tactile cueing for support. LLE coordination improved with repetition, especially with backwards ambulation.  Patient will continue to benefit from skilled physical therapy to improve gait mechanics, strength, and independence    Rehab Potential  Fair    Clinical Impairments Affecting Rehab Potential  (+) good family support (-) year since stroke    PT Frequency  2x / week    PT Duration  8 weeks    PT Treatment/Interventions  ADLs/Self Care Home Management;Cryotherapy;Electrical Stimulation;Moist Heat;Traction;Ultrasound;Therapeutic exercise;Therapeutic activities;Functional mobility training;Stair training;Gait training;DME Instruction;Balance training;Neuromuscular re-education;Patient/family education;Orthotic Fit/Training;Manual techniques;Passive range of motion;Energy conservation;Visual/perceptual remediation/compensation    PT Next Visit Plan  transfer, sts, LE strength    PT Home Exercise Plan  see sheet    Consulted and Agree with Plan of Care  Patient;Family member/caregiver    Family Member Consulted  spouse       Patient will benefit from skilled therapeutic intervention in order to improve the following deficits and impairments:  Decreased strength, Impaired UE functional use, Impaired tone, Difficulty walking, Abnormal gait, Decreased activity tolerance, Decreased balance, Decreased knowledge of precautions, Decreased endurance, Decreased coordination, Decreased knowledge of use of DME, Decreased mobility, Decreased safety awareness, Impaired perceived functional ability, Impaired flexibility, Impaired sensation, Improper body mechanics, Postural dysfunction  Visit Diagnosis: Other abnormalities of gait and mobility  Muscle weakness (generalized)  Other lack of coordination     Problem List Patient Active Problem List   Diagnosis Date  Noted  . Collagen vascular disease ([place]) 05/21/2017  . 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 [place], [place], [address] Phone: [phone]   Fax:  [phone]  Name: Ariel Alvarez MRN: [id number] Date of Birth: 1946-04-26

## 2018-05-07 ENCOUNTER — Ambulatory Visit: Payer: Medicare Other

## 2018-05-07 DIAGNOSIS — M6281 Muscle weakness (generalized): Secondary | ICD-10-CM

## 2018-05-07 DIAGNOSIS — R2689 Other abnormalities of gait and mobility: Secondary | ICD-10-CM

## 2018-05-07 DIAGNOSIS — R278 Other lack of coordination: Secondary | ICD-10-CM

## 2018-05-07 NOTE — Therapy (Signed)
[place] MAIN [facility] SERVICES [address] [place], [place], [address] Phone: [phone]   Fax:  [phone]  Physical Therapy Treatment  Patient Details  Name: Ariel Alvarez MRN: [id number] Date of Birth: Nov 03, 1945 Referring Provider: Dr. Paula Compton   Encounter Date: 05/07/2018  PT End of Session - 05/07/18 1255    Visit Number  14    Number of Visits  32    Date for PT Re-Evaluation  06/09/18    Authorization Type  5/10    PT Start Time  0277    PT Stop Time  1330    PT Time Calculation (min)  49 min    Equipment Utilized During Treatment  Gait belt    Activity Tolerance  Patient limited by fatigue;Patient tolerated treatment well    Behavior During Therapy  [facility] for tasks assessed/performed       Past Medical History:  Diagnosis Date  . Anxiety   . Collagen vascular disease ([place])   . Depression   . Stroke [facility])     Past Surgical History:  Procedure Laterality Date  . CHOLECYSTECTOMY      There were no vitals filed for this visit.  Subjective Assessment - 05/07/18 1254    Subjective  Patient arrived early and needed to use rest room requiring start of session to be earlier than anticipated. No aches or pains other than some mild stomach problems this morning.     Patient is accompained by:  Family member    Pertinent History  Ariel Alvarez is a 72 y.o. 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 Feels like her walking and stability is decreased. Home Health PT stopped about 3 months ago.  Walked last year prior to stroke 01/2017 which then affected bilateral legs. Was in nursing home and fell out of bed and fractured L shoulder. Uses a walker and a lift chair to take a few steps. Stands at home with husband.     Limitations  Walking    How long can you sit comfortably?  n/a    How long can you stand comfortably?  5-6 minutes    How long can you walk comfortably?   4 steps    Patient Stated Goals  Patient wants to become more independent, walk and stand better.     Currently in Pain?  No/denies      TREATMENT  Bathroom mobility: required Mod A to obtain standing position from toilet and dependent upon re-dressing self.   Nustep lvl 2 4 minutes no UE (LE only) SMP >60 for cardiovascular challenge   Standing marching in // bars 10x each leg, SUE support; 2 sets : Mod A for weight shift over LLE for RLE marches; tactile cues for placement of LLE.   Standing weight shift in mirror. Bend of knee to L and R with initial Mod A to min A by end x 2 minutes   Standing hip extension 10x each leg in // bars SUE support. ; 2 sets ; improved step length with repetition      Ambulating backwards 3 ft x 6 trials in // bars with max verbal cueing for moving L foot. Required 2 steps for L for every 1 step R initially with improved equality of steps with repetition.    Seated hip adduction with ball x 15; 3 second hold ; 2 sets  Seated marches 15x each leg; Seated HS curls  Bil with RTB x 10; 2 sets  Heel toe raises 15x seated ; one leg at a time            Pt. response to medical necessity:  Patient will continue to benefit from skilled physical therapy to improve gait mechanics, strength, and independence                        PT Education - 05/07/18 1255    Education provided  Yes    Education Details  exercise technique    [name]  Patient    Methods  Explanation;Demonstration;Verbal cues    Comprehension  Returned demonstration;Verbalized understanding       PT Short Term Goals - 04/14/18 1320      PT SHORT TERM GOAL #1   Title  Patient will perform one sit to stand independently to increase independence with transfers    Baseline  4/30: Min A 6/17: able to perform     Time  2    Period  Weeks    Status  Achieved      PT SHORT TERM GOAL #2   Title  Patient will be independent in home exercise program to  improve strength/mobility for better functional independence with ADLs.    Baseline  4/30: exercises given 6/17: doing HEP 3x/week     Time  2    Period  Weeks    Status  Partially Met      PT SHORT TERM GOAL #3   Title  Patient will ambulate 10 ft with hemiwalker and CGA for improved home mobility    Baseline  ambulate 5 ft.  6/17: 28 ft     Time  2    Period  Weeks    Status  Achieved      PT SHORT TERM GOAL #4   Title  Patient will increase BLE gross strength to 4-/5 as to improve functional strength for independent gait, increased standing tolerance and increased ADL ability.    Baseline  4/30: 3/5 6/17: 4-/10     Time  2    Period  Weeks    Status  Achieved        PT Long Term Goals - 04/14/18 1321      PT LONG TERM GOAL #1   Title  Patient will perform 5 STS < 20 seconds independently with hemi walker to demonstrate improved home mobility.     Baseline  4/30: require min A for one 6/17: 5x STS: 65 seconds with min A for stability at top position last 2 sit to stands.     Time  8    Period  Weeks    Status  Partially Met    Target Date  06/09/18      PT LONG TERM GOAL #2   Title  Patient will increase ABC scale score >50% to demonstrate better functional mobility and better confidence with ADLs.     Baseline  4/30: 7.5% 6/17: 8%    Time  8    Period  Weeks    Status  Partially Met    Target Date  06/09/18      PT LONG TERM GOAL #3   Title  Patient will ambulate 60 ft with hemi walker and CGA to improve home mobility and independence with gait.     Baseline  4/30: ambulate 5 ft; 6/17P 28 ft with CGA and hemiwalker    Time  8  Period  Weeks    Status  Partially Met    Target Date  06/09/18      PT LONG TERM GOAL #4   Title  Patient will increase BLE gross strength to 4+/5 as to improve functional strength for independent gait, increased standing tolerance and increased ADL ability    Baseline  4/30: 3/5  6/17 : 4-/5    Time  8    Period  Weeks    Status   Partially Met    Target Date  06/09/18      PT LONG TERM GOAL #5   Title  Patient will increase lower extremity functional scale to >30/80 to demonstrate improved functional mobility and increased tolerance with ADLs.     Baseline  4/30: 9/80 6/17: 28/80    Time  8    Period  Weeks    Status  Partially Met    Target Date  06/09/18            Plan - 05/07/18 1311    Clinical Impression Statement  Patient requires Mod A for weight shift over LLE due to fear of instability and limited attention to left side. Patient requires verbal cueing for sustained contraction of musculature otherwise reverting to fluttering contractions.  Patient will continue to benefit from skilled physical therapy to improve gait mechanics, strength, and independence    Rehab Potential  Fair    Clinical Impairments Affecting Rehab Potential  (+) good family support (-) year since stroke    PT Frequency  2x / week    PT Duration  8 weeks    PT Treatment/Interventions  ADLs/Self Care Home Management;Cryotherapy;Electrical Stimulation;Moist Heat;Traction;Ultrasound;Therapeutic exercise;Therapeutic activities;Functional mobility training;Stair training;Gait training;DME Instruction;Balance training;Neuromuscular re-education;Patient/family education;Orthotic Fit/Training;Manual techniques;Passive range of motion;Energy conservation;Visual/perceptual remediation/compensation    PT Next Visit Plan  transfer, sts, LE strength    PT Home Exercise Plan  see sheet    Consulted and Agree with Plan of Care  Patient;Family member/caregiver    Family Member Consulted  spouse       Patient will benefit from skilled therapeutic intervention in order to improve the following deficits and impairments:  Decreased strength, Impaired UE functional use, Impaired tone, Difficulty walking, Abnormal gait, Decreased activity tolerance, Decreased balance, Decreased knowledge of precautions, Decreased endurance, Decreased coordination,  Decreased knowledge of use of DME, Decreased mobility, Decreased safety awareness, Impaired perceived functional ability, Impaired flexibility, Impaired sensation, Improper body mechanics, Postural dysfunction  Visit Diagnosis: Other abnormalities of gait and mobility  Muscle weakness (generalized)  Other lack of coordination     Problem List Patient Active Problem List   Diagnosis Date Noted  . Collagen vascular disease ([place]) 05/21/2017  . Gangrene of finger of right hand ([place]) 05/21/2017   Janna Arch, PT, DPT   05/07/2018, 1:32 PM  [place] MAIN [facility] SERVICES [address] [place], [place], [address] Phone: [phone]   Fax:  [phone]  Name: Ariel Alvarez MRN: [id number] Date of Birth: [birth year]

## 2018-05-12 ENCOUNTER — Ambulatory Visit: Payer: Medicare Other

## 2018-05-12 DIAGNOSIS — R278 Other lack of coordination: Secondary | ICD-10-CM

## 2018-05-12 DIAGNOSIS — R2689 Other abnormalities of gait and mobility: Secondary | ICD-10-CM | POA: Diagnosis not present

## 2018-05-12 DIAGNOSIS — M6281 Muscle weakness (generalized): Secondary | ICD-10-CM

## 2018-05-12 NOTE — Therapy (Signed)
[place] MAIN [facility] SERVICES [address] [place], [place], [address] Phone: [phone]   Fax:  [phone]  Physical Therapy Treatment  Patient Details  Name: Ariel Alvarez MRN: [id number] Date of Birth: 20-Feb-1946 Referring Provider: Dr. Paula Compton   Encounter Date: 05/12/2018  PT End of Session - 05/12/18 1333    Visit Number  15    Number of Visits  32    Date for PT Re-Evaluation  06/09/18    Authorization Type  6/10    PT Start Time  1259    PT Stop Time  1345    PT Time Calculation (min)  46 min    Equipment Utilized During Treatment  Gait belt    Activity Tolerance  Patient limited by fatigue;Patient tolerated treatment well    Behavior During Therapy  [facility] for tasks assessed/performed       Past Medical History:  Diagnosis Date  . Anxiety   . Collagen vascular disease ([place])   . Depression   . Stroke [facility])     Past Surgical History:  Procedure Laterality Date  . CHOLECYSTECTOMY      There were no vitals filed for this visit.  Subjective Assessment - 05/12/18 1305    Subjective  Patient reports not moving much this weekend due to her husband cleaning a lot. Reports sadness that she was unable to help her husband around the house and feeling upset that she is "useless"     Patient is accompained by:  Family member    Pertinent History  Ariel Alvarez is a 72 y.o. 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 Feels like her walking and stability is decreased. Home Health PT stopped about 3 months ago.  Walked last year prior to stroke 01/2017 which then affected bilateral legs. Was in nursing home and fell out of bed and fractured L shoulder. Uses a walker and a lift chair to take a few steps. Stands at home with husband.     Limitations  Walking    How long can you sit comfortably?  n/a    How long can you stand comfortably?  5-6 minutes    How long can you  walk comfortably?  4 steps    Patient Stated Goals  Patient wants to become more independent, walk and stand better.     Currently in Pain?  No/denies       Nustep Lvl1-2 3 minutes ; cues for keeping RPM >60 for cardiovascular capacity;; stopped due fatigue.  SPT with CGA and Mod verbal cueing of sequencing of feet, hemiwalker, and side stepping from [facility] <>Nustep     TREATMENT   Seated hip adduction with ball x 10; 3 second hold ; 2 sets   Seated gluteal squeezes 10x 3 second holds ; 2 sets   Seated marches 15x each leg;  seated heel raises.   Seated HS curls Bil with RTB x 10;    Seated hamstring stretch 2x 30 seconds    Seated soccer ball kicks 10x each leg (LAQ) ; PT kick ball to patient with patient returning kick to improve reflexes and strength simultaneously.    Seated soccer ball kicks (LAQ) between "goals" (two cones) 10x each leg  Ambulating inhallwaywith hemiwalker with cues to keep hemiwalker at side instead of in front . Pt ambulated,19f, and238fith seated rest break in between.Turn around R side with hemiwalker andminverbalfor hemiwalker placement. WC follow  Max verbal cueing for task orientation today.      Pt. response to medical necessity:  Patient will continue to benefit from skilled physical therapy to improve gait mechanics, strength, and independence   vitals after walking: 129/71 pulse (90)                        PT Education - 05/12/18 1307    Education provided  Yes    Education Details  exercise technique     [name]  Patient    Methods  Explanation;Demonstration;Verbal cues    Comprehension  Verbalized understanding;Returned demonstration       PT Short Term Goals - 04/14/18 1320      PT SHORT TERM GOAL #1   Title  Patient will perform one sit to stand independently to increase independence with transfers    Baseline  4/30: Min A 6/17: able to perform     Time  2    Period  Weeks     Status  Achieved      PT SHORT TERM GOAL #2   Title  Patient will be independent in home exercise program to improve strength/mobility for better functional independence with ADLs.    Baseline  4/30: exercises given 6/17: doing HEP 3x/week     Time  2    Period  Weeks    Status  Partially Met      PT SHORT TERM GOAL #3   Title  Patient will ambulate 10 ft with hemiwalker and CGA for improved home mobility    Baseline  ambulate 5 ft.  6/17: 28 ft     Time  2    Period  Weeks    Status  Achieved      PT SHORT TERM GOAL #4   Title  Patient will increase BLE gross strength to 4-/5 as to improve functional strength for independent gait, increased standing tolerance and increased ADL ability.    Baseline  4/30: 3/5 6/17: 4-/10     Time  2    Period  Weeks    Status  Achieved        PT Long Term Goals - 04/14/18 1321      PT LONG TERM GOAL #1   Title  Patient will perform 5 STS < 20 seconds independently with hemi walker to demonstrate improved home mobility.     Baseline  4/30: require min A for one 6/17: 5x STS: 65 seconds with min A for stability at top position last 2 sit to stands.     Time  8    Period  Weeks    Status  Partially Met    Target Date  06/09/18      PT LONG TERM GOAL #2   Title  Patient will increase ABC scale score >50% to demonstrate better functional mobility and better confidence with ADLs.     Baseline  4/30: 7.5% 6/17: 8%    Time  8    Period  Weeks    Status  Partially Met    Target Date  06/09/18      PT LONG TERM GOAL #3   Title  Patient will ambulate 60 ft with hemi walker and CGA to improve home mobility and independence with gait.     Baseline  4/30: ambulate 5 ft; 6/17P 28 ft with CGA and hemiwalker    Time  8    Period  Weeks    Status  Partially Met    Target Date  06/09/18      PT LONG TERM GOAL #4   Title  Patient will increase BLE gross strength to 4+/5 as to improve functional strength for independent gait, increased standing  tolerance and increased ADL ability    Baseline  4/30: 3/5  6/17 : 4-/5    Time  8    Period  Weeks    Status  Partially Met    Target Date  06/09/18      PT LONG TERM GOAL #5   Title  Patient will increase lower extremity functional scale to >30/80 to demonstrate improved functional mobility and increased tolerance with ADLs.     Baseline  4/30: 9/80 6/17: 28/80    Time  8    Period  Weeks    Status  Partially Met    Target Date  06/09/18            Plan - 05/12/18 1335    Clinical Impression Statement  Patient was not feeling well today and felt better after drinking water. Required longer rest breaks due to not feeling well. Vitals monitored throughout session and patient reports she is on new medication for her yeast infection. Max verbal cueing for task orientation required today.  Patient will continue to benefit from skilled physical therapy to improve gait mechanics, strength, and independence    Rehab Potential  Fair    Clinical Impairments Affecting Rehab Potential  (+) good family support (-) year since stroke    PT Frequency  2x / week    PT Duration  8 weeks    PT Treatment/Interventions  ADLs/Self Care Home Management;Cryotherapy;Electrical Stimulation;Moist Heat;Traction;Ultrasound;Therapeutic exercise;Therapeutic activities;Functional mobility training;Stair training;Gait training;DME Instruction;Balance training;Neuromuscular re-education;Patient/family education;Orthotic Fit/Training;Manual techniques;Passive range of motion;Energy conservation;Visual/perceptual remediation/compensation    PT Next Visit Plan  transfer, sts, LE strength    PT Home Exercise Plan  see sheet    Consulted and Agree with Plan of Care  Patient;Family member/caregiver    Family Member Consulted  spouse       Patient will benefit from skilled therapeutic intervention in order to improve the following deficits and impairments:  Decreased strength, Impaired UE functional use, Impaired tone,  Difficulty walking, Abnormal gait, Decreased activity tolerance, Decreased balance, Decreased knowledge of precautions, Decreased endurance, Decreased coordination, Decreased knowledge of use of DME, Decreased mobility, Decreased safety awareness, Impaired perceived functional ability, Impaired flexibility, Impaired sensation, Improper body mechanics, Postural dysfunction  Visit Diagnosis: Other abnormalities of gait and mobility  Muscle weakness (generalized)  Other lack of coordination     Problem List Patient Active Problem List   Diagnosis Date Noted  . Collagen vascular disease ([place]) 05/21/2017  . 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

## 2018-05-14 ENCOUNTER — Ambulatory Visit: Payer: Medicare Other

## 2018-05-14 DIAGNOSIS — R2689 Other abnormalities of gait and mobility: Secondary | ICD-10-CM | POA: Diagnosis not present

## 2018-05-14 DIAGNOSIS — M6281 Muscle weakness (generalized): Secondary | ICD-10-CM

## 2018-05-14 DIAGNOSIS — R278 Other lack of coordination: Secondary | ICD-10-CM

## 2018-05-14 NOTE — Therapy (Signed)
[place] MAIN [facility] SERVICES [address] [place], [place], [address] Phone: [phone]   Fax:  [phone]  Physical Therapy Treatment  Patient Details  Name: Ariel Alvarez MRN: [id number] Date of Birth: 07-11-1946 Referring Provider: Dr. Paula Compton   Encounter Date: 05/14/2018  PT End of Session - 05/14/18 1309    Visit Number  16    Number of Visits  32    Date for PT Re-Evaluation  06/09/18    Authorization Type  7/10    PT Start Time  1301    PT Stop Time  1345    PT Time Calculation (min)  44 min    Equipment Utilized During Treatment  Gait belt    Activity Tolerance  Patient limited by fatigue;Patient tolerated treatment well    Behavior During Therapy  [facility] for tasks assessed/performed       Past Medical History:  Diagnosis Date  . Anxiety   . Collagen vascular disease ([place])   . Depression   . Stroke [facility])     Past Surgical History:  Procedure Laterality Date  . CHOLECYSTECTOMY      There were no vitals filed for this visit.  Nustep Lvl1-2 3 minutes ; cues for keeping RPM >60 for cardiovascular capacity;; stopped due fatigue.  SPT with CGA and Mod verbal cueing of sequencing of feet, hemiwalker, and side stepping from [facility] <>Nustep  Subjective Assessment - 05/14/18 1308    Subjective  Patient reports working out this morning. Had her husband take her to therapy session today since he stayed home from work sick.     Patient is accompained by:  Family member    Pertinent History  Ariel Alvarez is a 72 y.o. 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 Feels like her walking and stability is decreased. Home Health PT stopped about 3 months ago.  Walked last year prior to stroke 01/2017 which then affected bilateral legs. Was in nursing home and fell out of bed and fractured L shoulder. Uses a walker and a lift chair to take a few steps. Stands at home with  husband.     Limitations  Walking    How long can you sit comfortably?  n/a    How long can you stand comfortably?  5-6 minutes    How long can you walk comfortably?  4 steps    Patient Stated Goals  Patient wants to become more independent, walk and stand better.     Currently in Pain?  No/denies         TREATMENT Standing marching in // bars 10x each leg, SUE support; 2 sets : tactile support to L quadriceps to maintain knee extension    Standing hip extension 10x each leg in // bars SUE support. ; 1 set terminated due to LE shaking and patient limited standing capacity today.    Standing abduction 10x each leg,  SUE support   Ambulating backwards 3 ft x 6 trials in // bars with max verbal cueing for moving L foot. Required 2 steps for L for every 1 step R initially with improved equality of steps with repetition.    Seated hip adduction with manual resistance x 10; 3 second hold ; 2 sets  Seated gluteal squeezes 20x 3 second holds : PT provide mod-max stabilization to L foot for positioning.  Seated marches 20x each leg; seated heel raises.  Seated HS curls  Bil with RTB x 10;    Patient has excessive ER of LLE requiring prolonged stretch holds into IR (3x60 seconds)        Pt. response to medical necessity:  Patient will continue to benefit from skilled physical therapy to improve gait mechanics, strength, and independence                            PT Education - 05/14/18 1308    Education provided  Yes    Education Details  exercise technique     [name]  Patient    Methods  Explanation;Demonstration;Verbal cues    Comprehension  Verbalized understanding;Returned demonstration       PT Short Term Goals - 04/14/18 1320      PT SHORT TERM GOAL #1   Title  Patient will perform one sit to stand independently to increase independence with transfers    Baseline  4/30: Min A 6/17: able to perform     Time  2    Period  Weeks    Status   Achieved      PT SHORT TERM GOAL #2   Title  Patient will be independent in home exercise program to improve strength/mobility for better functional independence with ADLs.    Baseline  4/30: exercises given 6/17: doing HEP 3x/week     Time  2    Period  Weeks    Status  Partially Met      PT SHORT TERM GOAL #3   Title  Patient will ambulate 10 ft with hemiwalker and CGA for improved home mobility    Baseline  ambulate 5 ft.  6/17: 28 ft     Time  2    Period  Weeks    Status  Achieved      PT SHORT TERM GOAL #4   Title  Patient will increase BLE gross strength to 4-/5 as to improve functional strength for independent gait, increased standing tolerance and increased ADL ability.    Baseline  4/30: 3/5 6/17: 4-/10     Time  2    Period  Weeks    Status  Achieved        PT Long Term Goals - 04/14/18 1321      PT LONG TERM GOAL #1   Title  Patient will perform 5 STS < 20 seconds independently with hemi walker to demonstrate improved home mobility.     Baseline  4/30: require min A for one 6/17: 5x STS: 65 seconds with min A for stability at top position last 2 sit to stands.     Time  8    Period  Weeks    Status  Partially Met    Target Date  06/09/18      PT LONG TERM GOAL #2   Title  Patient will increase ABC scale score >50% to demonstrate better functional mobility and better confidence with ADLs.     Baseline  4/30: 7.5% 6/17: 8%    Time  8    Period  Weeks    Status  Partially Met    Target Date  06/09/18      PT LONG TERM GOAL #3   Title  Patient will ambulate 60 ft with hemi walker and CGA to improve home mobility and independence with gait.     Baseline  4/30: ambulate 5 ft; 6/17P 28 ft with CGA and hemiwalker      Time  8    Period  Weeks    Status  Partially Met    Target Date  06/09/18      PT LONG TERM GOAL #4   Title  Patient will increase BLE gross strength to 4+/5 as to improve functional strength for independent gait, increased standing tolerance and  increased ADL ability    Baseline  4/30: 3/5  6/17 : 4-/5    Time  8    Period  Weeks    Status  Partially Met    Target Date  06/09/18      PT LONG TERM GOAL #5   Title  Patient will increase lower extremity functional scale to >30/80 to demonstrate improved functional mobility and increased tolerance with ADLs.     Baseline  4/30: 9/80 6/17: 28/80    Time  8    Period  Weeks    Status  Partially Met    Target Date  06/09/18            Plan - 05/14/18 1330    Clinical Impression Statement  Patient's standing tolerance limited by LE weakness and shaking limiting standing duration. Patient was more fearful of standing today with decreased coordination and ability to follow commands. Patient will continue to benefit from skilled physical therapy to improve gait mechanics, strength, and independence    Rehab Potential  Fair    Clinical Impairments Affecting Rehab Potential  (+) good family support (-) year since stroke    PT Frequency  2x / week    PT Duration  8 weeks    PT Treatment/Interventions  ADLs/Self Care Home Management;Cryotherapy;Electrical Stimulation;Moist Heat;Traction;Ultrasound;Therapeutic exercise;Therapeutic activities;Functional mobility training;Stair training;Gait training;DME Instruction;Balance training;Neuromuscular re-education;Patient/family education;Orthotic Fit/Training;Manual techniques;Passive range of motion;Energy conservation;Visual/perceptual remediation/compensation    PT Next Visit Plan  transfer, sts, LE strength    PT Home Exercise Plan  see sheet    Consulted and Agree with Plan of Care  Patient;Family member/caregiver    Family Member Consulted  spouse       Patient will benefit from skilled therapeutic intervention in order to improve the following deficits and impairments:  Decreased strength, Impaired UE functional use, Impaired tone, Difficulty walking, Abnormal gait, Decreased activity tolerance, Decreased balance, Decreased knowledge of  precautions, Decreased endurance, Decreased coordination, Decreased knowledge of use of DME, Decreased mobility, Decreased safety awareness, Impaired perceived functional ability, Impaired flexibility, Impaired sensation, Improper body mechanics, Postural dysfunction  Visit Diagnosis: Other abnormalities of gait and mobility  Muscle weakness (generalized)  Other lack of coordination     Problem List Patient Active Problem List   Diagnosis Date Noted  . Collagen vascular disease ([place]) 05/21/2017  . Gangrene of finger of right hand ([place]) 05/21/2017   Janna Arch, PT, DPT   05/14/2018, 1:47 PM  [place] MAIN [facility] SERVICES [address] [place], [place], [address] Phone: [phone]   Fax:  [phone]  Name: Ariel Alvarez MRN: [id number] Date of Birth: [birth year]

## 2018-05-19 ENCOUNTER — Ambulatory Visit: Payer: Medicare Other

## 2018-05-19 DIAGNOSIS — R2689 Other abnormalities of gait and mobility: Secondary | ICD-10-CM | POA: Diagnosis not present

## 2018-05-19 DIAGNOSIS — R278 Other lack of coordination: Secondary | ICD-10-CM

## 2018-05-19 DIAGNOSIS — M6281 Muscle weakness (generalized): Secondary | ICD-10-CM

## 2018-05-19 NOTE — Therapy (Signed)
[place] MAIN [facility] SERVICES [address] [place], [place], [address] Phone: [phone]   Fax:  [phone]  Physical Therapy Treatment  Patient Details  Name: Ariel Alvarez MRN: [id number] Date of Birth: 26-Dec-1945 Referring Provider: Dr. Paula Compton   Encounter Date: 05/19/2018  PT End of Session - 05/19/18 1314    Visit Number  17    Number of Visits  32    Date for PT Re-Evaluation  06/09/18    Authorization Type  8/10    PT Start Time  1308    PT Stop Time  1346    PT Time Calculation (min)  38 min    Equipment Utilized During Treatment  Gait belt    Activity Tolerance  Patient limited by fatigue;Patient tolerated treatment well    Behavior During Therapy  [facility] for tasks assessed/performed       Past Medical History:  Diagnosis Date  . Anxiety   . Collagen vascular disease ([place])   . Depression   . Stroke [facility])     Past Surgical History:  Procedure Laterality Date  . CHOLECYSTECTOMY      There were no vitals filed for this visit.  Subjective Assessment - 05/19/18 1310    Subjective  Patient reports she walked 90 ft to bathroom and back by herself this weekend. She also was able to walk into her walk in shower for first time in 2 years to take a bath/shower.     Patient is accompained by:  Family member    Pertinent History  Ariel Alvarez is a 72 y.o. 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 Feels like her walking and stability is decreased. Home Health PT stopped about 3 months ago.  Walked last year prior to stroke 01/2017 which then affected bilateral legs. Was in nursing home and fell out of bed and fractured L shoulder. Uses a walker and a lift chair to take a few steps. Stands at home with husband.     Limitations  Walking    How long can you sit comfortably?  n/a    How long can you stand comfortably?  5-6 minutes    How long can you walk comfortably?   4 steps    Patient Stated Goals  Patient wants to become more independent, walk and stand better.     Currently in Pain?  No/denies        TREATMENT Standing marching in // bars 10x each leg, SUE support; 2 sets : tactile support to L quadriceps to maintain knee extension    Standing hip extension 10x each leg in // bars SUE support. ; 2 sets    Standing abduction 10x each leg,  SUE support   Standing without UE assistance  Ambulating backwards 3 ft x 6 trials in // bars with max verbal cueing for moving L foot. Required 2 steps for L for every 1 step R initially with improved equality of steps with repetition.     Seated soccer ball kicks 10x each leg (LAQ) ; PT kick ball to patient with patient returning kick to improve reflexes and strength simultaneously.    Seated soccer ball kicks (LAQ) between "goals" (two cones) 10x each leg    Seated hip adduction with manual resistance x 10; 3 second hold ; 2 sets  Seated gluteal squeezes 15x 3 second holds ; Seated marches 15x each leg; seated heel raises 10x.  Seated HS curls Bil with RTB x 10;     Education on seated posture with ankle in line with knee to promote improved stability and gait mechanics.      Pt. response to medical necessity:  Patient will continue to benefit from skilled physical therapy to improve gait mechanics, strength, and independence                         PT Education - 05/19/18 1314    Education provided  Yes    Education Details  exercise technique     [name]  Patient    Methods  Explanation;Demonstration;Verbal cues    Comprehension  Verbalized understanding;Returned demonstration       PT Short Term Goals - 04/14/18 1320      PT SHORT TERM GOAL #1   Title  Patient will perform one sit to stand independently to increase independence with transfers    Baseline  4/30: Min A 6/17: able to perform     Time  2    Period  Weeks    Status  Achieved       PT SHORT TERM GOAL #2   Title  Patient will be independent in home exercise program to improve strength/mobility for better functional independence with ADLs.    Baseline  4/30: exercises given 6/17: doing HEP 3x/week     Time  2    Period  Weeks    Status  Partially Met      PT SHORT TERM GOAL #3   Title  Patient will ambulate 10 ft with hemiwalker and CGA for improved home mobility    Baseline  ambulate 5 ft.  6/17: 28 ft     Time  2    Period  Weeks    Status  Achieved      PT SHORT TERM GOAL #4   Title  Patient will increase BLE gross strength to 4-/5 as to improve functional strength for independent gait, increased standing tolerance and increased ADL ability.    Baseline  4/30: 3/5 6/17: 4-/10     Time  2    Period  Weeks    Status  Achieved        PT Long Term Goals - 04/14/18 1321      PT LONG TERM GOAL #1   Title  Patient will perform 5 STS < 20 seconds independently with hemi walker to demonstrate improved home mobility.     Baseline  4/30: require min A for one 6/17: 5x STS: 65 seconds with min A for stability at top position last 2 sit to stands.     Time  8    Period  Weeks    Status  Partially Met    Target Date  06/09/18      PT LONG TERM GOAL #2   Title  Patient will increase ABC scale score >50% to demonstrate better functional mobility and better confidence with ADLs.     Baseline  4/30: 7.5% 6/17: 8%    Time  8    Period  Weeks    Status  Partially Met    Target Date  06/09/18      PT LONG TERM GOAL #3   Title  Patient will ambulate 60 ft with hemi walker and CGA to improve home mobility and independence with gait.     Baseline  4/30: ambulate 5 ft; 6/17P 28 ft with CGA and hemiwalker  Time  8    Period  Weeks    Status  Partially Met    Target Date  06/09/18      PT LONG TERM GOAL #4   Title  Patient will increase BLE gross strength to 4+/5 as to improve functional strength for independent gait, increased standing tolerance and increased ADL  ability    Baseline  4/30: 3/5  6/17 : 4-/5    Time  8    Period  Weeks    Status  Partially Met    Target Date  06/09/18      PT LONG TERM GOAL #5   Title  Patient will increase lower extremity functional scale to >30/80 to demonstrate improved functional mobility and increased tolerance with ADLs.     Baseline  4/30: 9/80 6/17: 28/80    Time  8    Period  Weeks    Status  Partially Met    Target Date  06/09/18            Plan - 05/19/18 1338    Clinical Impression Statement  Patient arrived late to session today limiting duration of interventions. Patient requires frequent assistance returning to seated position due to preference to sit at an angle into the seat.  Patient will continue to benefit from skilled physical therapy to improve gait mechanics, strength, and independence    Rehab Potential  Fair    Clinical Impairments Affecting Rehab Potential  (+) good family support (-) year since stroke    PT Frequency  2x / week    PT Duration  8 weeks    PT Treatment/Interventions  ADLs/Self Care Home Management;Cryotherapy;Electrical Stimulation;Moist Heat;Traction;Ultrasound;Therapeutic exercise;Therapeutic activities;Functional mobility training;Stair training;Gait training;DME Instruction;Balance training;Neuromuscular re-education;Patient/family education;Orthotic Fit/Training;Manual techniques;Passive range of motion;Energy conservation;Visual/perceptual remediation/compensation    PT Next Visit Plan  transfer, sts, LE strength    PT Home Exercise Plan  see sheet    Consulted and Agree with Plan of Care  Patient;Family member/caregiver    Family Member Consulted  spouse       Patient will benefit from skilled therapeutic intervention in order to improve the following deficits and impairments:  Decreased strength, Impaired UE functional use, Impaired tone, Difficulty walking, Abnormal gait, Decreased activity tolerance, Decreased balance, Decreased knowledge of precautions,  Decreased endurance, Decreased coordination, Decreased knowledge of use of DME, Decreased mobility, Decreased safety awareness, Impaired perceived functional ability, Impaired flexibility, Impaired sensation, Improper body mechanics, Postural dysfunction  Visit Diagnosis: Other abnormalities of gait and mobility  Muscle weakness (generalized)  Other lack of coordination     Problem List Patient Active Problem List   Diagnosis Date Noted  . Collagen vascular disease ([place]) 05/21/2017  . Gangrene of finger of right hand ([place]) 05/21/2017   Janna Arch, PT, DPT   05/19/2018, 1:48 PM  [place] MAIN [facility] SERVICES [address] [place], [place], [address] Phone: [phone]   Fax:  [phone]  Name: Ariel Alvarez MRN: [id number] Date of Birth: [birth year]

## 2018-05-21 ENCOUNTER — Ambulatory Visit: Payer: Medicare Other

## 2018-05-21 DIAGNOSIS — R2689 Other abnormalities of gait and mobility: Secondary | ICD-10-CM | POA: Diagnosis not present

## 2018-05-21 DIAGNOSIS — M6281 Muscle weakness (generalized): Secondary | ICD-10-CM

## 2018-05-21 DIAGNOSIS — R278 Other lack of coordination: Secondary | ICD-10-CM

## 2018-05-21 NOTE — Therapy (Signed)
[place] MAIN [facility] SERVICES [address] [place], [place], [address] Phone: [phone]   Fax:  [phone]  Physical Therapy Treatment  Patient Details  Name: Ariel Alvarez MRN: [id number] Date of Birth: [birth year] Referring Provider: Dr. Paula Compton   Encounter Date: 05/21/2018  PT End of Session - 05/21/18 1310    Visit Number  18    Number of Visits  32    Date for PT Re-Evaluation  06/09/18    Authorization Type  9/10    PT Start Time  1302    PT Stop Time  1345    PT Time Calculation (min)  43 min    Equipment Utilized During Treatment  Gait belt    Activity Tolerance  Patient limited by fatigue;Patient tolerated treatment well    Behavior During Therapy  [facility] for tasks assessed/performed       Past Medical History:  Diagnosis Date  . Anxiety   . Collagen vascular disease ([place])   . Depression   . Stroke [facility])     Past Surgical History:  Procedure Laterality Date  . CHOLECYSTECTOMY      There were no vitals filed for this visit.  Subjective Assessment - 05/21/18 1309    Subjective  Patietn reports having some L knee pain yesterday that went away with a tylenol. Reports no pain today but is feeling weaker today.     Patient is accompained by:  Family member    Pertinent History  Ariel Alvarez is a 72 y.o. 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 Feels like her walking and stability is decreased. Home Health PT stopped about 3 months ago.  Walked last year prior to stroke 01/2017 which then affected bilateral legs. Was in nursing home and fell out of bed and fractured L shoulder. Uses a walker and a lift chair to take a few steps. Stands at home with husband.     Limitations  Walking    How long can you sit comfortably?  n/a    How long can you stand comfortably?  5-6 minutes    How long can you walk comfortably?  4 steps    Patient Stated Goals  Patient  wants to become more independent, walk and stand better.     Currently in Pain?  No/denies       TREATMENT    Nustep Lvl 1 3 minutes RPM >60 LE only for cardiovascular and strengthening.    Heel toe raises 15x seated    Seated hamstring stretch ; bilateral LE 2x60 seconds each leg    Seated marches 15x each leg    Ambulating in hallway with hemiwalker with cues to keep hemiwalker at side instead of in front . Pt ambulated 15 ft, 102f, and 168f with seated rest break in between. Turn around R side with hemiwalker and min verbal for hemiwalker placement.    Toileting : sit to stand, stand pivot transfer, prolonged standing duration, assistance with undergarments and pants (Mod A)    Pt. response to medical necessity:  Patient will continue to benefit from skilled physical therapy to improve gait mechanics, strength, and independence                          PT Education - 05/21/18 1310    Education provided  Yes    Education Details  exercise technique  [name]  Patient    Methods  Explanation;Demonstration;Verbal cues    Comprehension  Verbalized understanding;Returned demonstration       PT Short Term Goals - 04/14/18 1320      PT SHORT TERM GOAL #1   Title  Patient will perform one sit to stand independently to increase independence with transfers    Baseline  4/30: Min A 6/17: able to perform     Time  2    Period  Weeks    Status  Achieved      PT SHORT TERM GOAL #2   Title  Patient will be independent in home exercise program to improve strength/mobility for better functional independence with ADLs.    Baseline  4/30: exercises given 6/17: doing HEP 3x/week     Time  2    Period  Weeks    Status  Partially Met      PT SHORT TERM GOAL #3   Title  Patient will ambulate 10 ft with hemiwalker and CGA for improved home mobility    Baseline  ambulate 5 ft.  6/17: 28 ft     Time  2    Period  Weeks    Status  Achieved      PT  SHORT TERM GOAL #4   Title  Patient will increase BLE gross strength to 4-/5 as to improve functional strength for independent gait, increased standing tolerance and increased ADL ability.    Baseline  4/30: 3/5 6/17: 4-/10     Time  2    Period  Weeks    Status  Achieved        PT Long Term Goals - 04/14/18 1321      PT LONG TERM GOAL #1   Title  Patient will perform 5 STS < 20 seconds independently with hemi walker to demonstrate improved home mobility.     Baseline  4/30: require min A for one 6/17: 5x STS: 65 seconds with min A for stability at top position last 2 sit to stands.     Time  8    Period  Weeks    Status  Partially Met    Target Date  06/09/18      PT LONG TERM GOAL #2   Title  Patient will increase ABC scale score >50% to demonstrate better functional mobility and better confidence with ADLs.     Baseline  4/30: 7.5% 6/17: 8%    Time  8    Period  Weeks    Status  Partially Met    Target Date  06/09/18      PT LONG TERM GOAL #3   Title  Patient will ambulate 60 ft with hemi walker and CGA to improve home mobility and independence with gait.     Baseline  4/30: ambulate 5 ft; 6/17P 28 ft with CGA and hemiwalker    Time  8    Period  Weeks    Status  Partially Met    Target Date  06/09/18      PT LONG TERM GOAL #4   Title  Patient will increase BLE gross strength to 4+/5 as to improve functional strength for independent gait, increased standing tolerance and increased ADL ability    Baseline  4/30: 3/5  6/17 : 4-/5    Time  8    Period  Weeks    Status  Partially Met    Target Date  06/09/18  PT LONG TERM GOAL #5   Title  Patient will increase lower extremity functional scale to >30/80 to demonstrate improved functional mobility and increased tolerance with ADLs.     Baseline  4/30: 9/80 6/17: 28/80    Time  8    Period  Weeks    Status  Partially Met    Target Date  06/09/18            Plan - 05/21/18 1352    Clinical Impression  Statement  Patient had increased fatigue with interventions today requiring more frequent rest breaks. Bilateral knee buckling with fatigue present with increased. Patient required toileting break and utilized sit to stand and stand pivot transferring for safe mobility. Patient will continue to benefit from skilled physical therapy to improve gait mechanics, strength, and independence    Rehab Potential  Fair    Clinical Impairments Affecting Rehab Potential  (+) good family support (-) year since stroke    PT Frequency  2x / week    PT Duration  8 weeks    PT Treatment/Interventions  ADLs/Self Care Home Management;Cryotherapy;Electrical Stimulation;Moist Heat;Traction;Ultrasound;Therapeutic exercise;Therapeutic activities;Functional mobility training;Stair training;Gait training;DME Instruction;Balance training;Neuromuscular re-education;Patient/family education;Orthotic Fit/Training;Manual techniques;Passive range of motion;Energy conservation;Visual/perceptual remediation/compensation    PT Next Visit Plan  transfer, sts, LE strength    PT Home Exercise Plan  see sheet    Consulted and Agree with Plan of Care  Patient;Family member/caregiver    Family Member Consulted  spouse       Patient will benefit from skilled therapeutic intervention in order to improve the following deficits and impairments:  Decreased strength, Impaired UE functional use, Impaired tone, Difficulty walking, Abnormal gait, Decreased activity tolerance, Decreased balance, Decreased knowledge of precautions, Decreased endurance, Decreased coordination, Decreased knowledge of use of DME, Decreased mobility, Decreased safety awareness, Impaired perceived functional ability, Impaired flexibility, Impaired sensation, Improper body mechanics, Postural dysfunction  Visit Diagnosis: Other abnormalities of gait and mobility  Muscle weakness (generalized)  Other lack of coordination     Problem List Patient Active Problem  List   Diagnosis Date Noted  . Collagen vascular disease ([place]) 05/21/2017  . Gangrene of finger of right hand ([place]) 05/21/2017   Janna Arch, PT, DPT   05/21/2018, 1:53 PM  [place] MAIN [facility] SERVICES [address] [place], [place], [address] Phone: [phone]   Fax:  [phone]  Name: STELLA BORTLE MRN: [id number] Date of Birth: 1946/03/14

## 2018-05-26 ENCOUNTER — Ambulatory Visit: Payer: Medicare Other

## 2018-05-26 DIAGNOSIS — M6281 Muscle weakness (generalized): Secondary | ICD-10-CM

## 2018-05-26 DIAGNOSIS — R278 Other lack of coordination: Secondary | ICD-10-CM

## 2018-05-26 DIAGNOSIS — R2689 Other abnormalities of gait and mobility: Secondary | ICD-10-CM | POA: Diagnosis not present

## 2018-05-26 NOTE — Therapy (Signed)
[place] MAIN [facility] SERVICES [address] [place], [place], [address] Phone: [phone]   Fax:  [phone]  Physical Therapy Treatment Physical Therapy Progress Note   Dates of reporting period  04/14/18   to   05/26/18  Patient Details  Name: Ariel Alvarez MRN: [id number] Date of Birth: 12-15-1945 Referring Provider: Dr. Paula Compton   Encounter Date: 05/26/2018  PT End of Session - 05/27/18 0813    Visit Number  19    Number of Visits  32    Date for PT Re-Evaluation  06/09/18    Authorization Type  10/10 (next will be 1/10) PN start 05/26/18    PT Start Time  1250    PT Stop Time  1341    PT Time Calculation (min)  51 min    Equipment Utilized During Treatment  Gait belt    Activity Tolerance  Patient limited by fatigue;Patient tolerated treatment well    Behavior During Therapy  [facility] for tasks assessed/performed       Past Medical History:  Diagnosis Date  . Anxiety   . Collagen vascular disease ([place])   . Depression   . Stroke [facility])     Past Surgical History:  Procedure Laterality Date  . CHOLECYSTECTOMY      There were no vitals filed for this visit.  Subjective Assessment - 05/27/18 0811    Subjective  Patient arrived early and needed to use restroom, thus starting session early since needs assistance with toileting. Reports fatigue after toileting. Has gone to doctor about recurrent UTI's and difficulty voiding/challenges with urge control.     Patient is accompained by:  Family member    Pertinent History  Ariel Alvarez is a 72 y.o. 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 Feels like her walking and stability is decreased. Home Health PT stopped about 3 months ago.  Walked last year prior to stroke 01/2017 which then affected bilateral legs. Was in nursing home and fell out of bed and fractured L shoulder. Uses a walker and a lift chair to take a few steps.  Stands at home with husband.     Limitations  Walking    How long can you sit comfortably?  n/a    How long can you stand comfortably?  5-6 minutes    How long can you walk comfortably?  4 steps    Patient Stated Goals  Patient wants to become more independent, walk and stand better.     Currently in Pain?  No/denies       5x STS : 41 seconds with hemiwalker ABC=30% LEFS =17/80  60 ft with hemiwalker : 17 ft this session,  Seated soccer ball kicks 15x each leg (LAQ) ; PT kick ball to patient with patient returning kick to improve reflexes and strength simultaneously.    Seated soccer ball kicks (LAQ) between "goals" (two cones) 10x each leg    Toileting : sit to stand, stand pivot transfer, prolonged standing duration, assistance with undergarments and pants (Mod A) ; required assistance for wiping, garment return.   Pt. response to medical necessity: Patient will continue to benefit from skilled physical therapy to improve gait mechanics, strength, and independence    Patient's condition has the potential to improve in response to therapy. Maximum improvement is yet to be obtained. The anticipated improvement is attainable and reasonable in a generally predictable time. Start date of reporting period  04/14/18 end date of reporting period 05/26/18. Patient reports feeling more steady on her feet and stronger. She reports more confidence in her walking                       PT Education - 05/27/18 [phone]    Education provided  Yes    Education Details  POC, goals, exercise technique     [name]  Patient    Methods  Explanation;Demonstration;Verbal cues    Comprehension  Verbalized understanding;Returned demonstration       PT Short Term Goals - 04/14/18 1320      PT SHORT TERM GOAL #1   Title  Patient will perform one sit to stand independently to increase independence with transfers    Baseline  4/30: Min A 6/17: able to perform     Time  2     Period  Weeks    Status  Achieved      PT SHORT TERM GOAL #2   Title  Patient will be independent in home exercise program to improve strength/mobility for better functional independence with ADLs.    Baseline  4/30: exercises given 6/17: doing HEP 3x/week     Time  2    Period  Weeks    Status  Partially Met      PT SHORT TERM GOAL #3   Title  Patient will ambulate 10 ft with hemiwalker and CGA for improved home mobility    Baseline  ambulate 5 ft.  6/17: 28 ft     Time  2    Period  Weeks    Status  Achieved      PT SHORT TERM GOAL #4   Title  Patient will increase BLE gross strength to 4-/5 as to improve functional strength for independent gait, increased standing tolerance and increased ADL ability.    Baseline  4/30: 3/5 6/17: 4-/10     Time  2    Period  Weeks    Status  Achieved        PT Long Term Goals - 05/26/18 1319      PT LONG TERM GOAL #1   Title  Patient will perform 5 STS < 20 seconds independently with hemi walker to demonstrate improved home mobility.     Baseline  4/30: require min A for one 6/17: 5x STS: 65 seconds with min A for stability at top position last 2 sit to stands. 7/29: 41 seconds with hemiwalker    Time  8    Period  Weeks    Status  Partially Met    Target Date  07/21/18      PT LONG TERM GOAL #2   Title  Patient will increase ABC scale score >50% to demonstrate better functional mobility and better confidence with ADLs.     Baseline  4/30: 7.5% 6/17: 8%; 7/29: 30%    Time  8    Period  Weeks    Status  Partially Met    Target Date  07/21/18      PT LONG TERM GOAL #3   Title  Patient will ambulate 60 ft with hemi walker and CGA to improve home mobility and independence with gait.     Baseline  4/30: ambulate 5 ft; 6/17P 28 ft with CGA and hemiwalker 7/29: 17 ft with CGA and hemiwalker, fatigue due to previous bathroom mobility.     Time  8    Period  Weeks  Status  Partially Met      PT LONG TERM GOAL #4   Title  Patient  will increase BLE gross strength to 4+/5 as to improve functional strength for independent gait, increased standing tolerance and increased ADL ability    Baseline  4/30: 3/5  6/17 : 4-/5    Time  8    Period  Weeks    Status  Partially Met    Target Date  07/21/18      PT LONG TERM GOAL #5   Title  Patient will increase lower extremity functional scale to >30/80 to demonstrate improved functional mobility and increased tolerance with ADLs.     Baseline  4/30: 9/80 6/17: 28/80 7/29: 17/80     Time  8    Period  Weeks    Status  Partially Met    Target Date  07/21/18            Plan - 05/27/18 0815    Clinical Impression Statement  Patient demonstrated improved sit to stand time with increased LE assistance resulting in decreased UE reliance. 5x STS=41 seconds, ABC=30% LEFS 17/80. Patient's capacity for functional ambulation was limited today due to fatigue after prolonged toileting due to challenges with voiding and concurrent UTI. Patient has demonstrated improved ambulatory capacity in previous sessions however with increased duration and mechanics.  Patient's condition has the potential to improve in response to therapy. Maximum improvement is yet to be obtained. The anticipated improvement is attainable and reasonable in a generally predictable time. Patient will continue to benefit from skilled physical therapy to improve gait mechanics, strength, and independence    Rehab Potential  Fair    Clinical Impairments Affecting Rehab Potential  (+) good family support (-) year since stroke    PT Frequency  2x / week    PT Duration  8 weeks    PT Treatment/Interventions  ADLs/Self Care Home Management;Cryotherapy;Electrical Stimulation;Moist Heat;Traction;Ultrasound;Therapeutic exercise;Therapeutic activities;Functional mobility training;Stair training;Gait training;DME Instruction;Balance training;Neuromuscular re-education;Patient/family education;Orthotic Fit/Training;Manual  techniques;Passive range of motion;Energy conservation;Visual/perceptual remediation/compensation    PT Next Visit Plan  transfer, sts, LE strength    PT Home Exercise Plan  see sheet    Consulted and Agree with Plan of Care  Patient;Family member/caregiver    Family Member Consulted  spouse       Patient will benefit from skilled therapeutic intervention in order to improve the following deficits and impairments:  Decreased strength, Impaired UE functional use, Impaired tone, Difficulty walking, Abnormal gait, Decreased activity tolerance, Decreased balance, Decreased knowledge of precautions, Decreased endurance, Decreased coordination, Decreased knowledge of use of DME, Decreased mobility, Decreased safety awareness, Impaired perceived functional ability, Impaired flexibility, Impaired sensation, Improper body mechanics, Postural dysfunction  Visit Diagnosis: Other abnormalities of gait and mobility  Muscle weakness (generalized)  Other lack of coordination     Problem List Patient Active Problem List   Diagnosis Date Noted  . Collagen vascular disease ([place]) 05/21/2017  . 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 [place], [place], [address] Phone: [phone]   Fax:  [phone]  Name: Ariel Alvarez MRN: [id number] Date of Birth: 15-Oct-1946

## 2018-05-28 ENCOUNTER — Ambulatory Visit: Payer: Medicare Other

## 2018-06-01 ENCOUNTER — Other Ambulatory Visit: Payer: Self-pay

## 2018-06-01 ENCOUNTER — Emergency Department
Admission: EM | Admit: 2018-06-01 | Discharge: 2018-06-01 | Disposition: A | Discharge status: Home / Self Care | Payer: Medicare Other | Attending: Student in an Organized Health Care Education/Training Program | Admitting: Student in an Organized Health Care Education/Training Program

## 2018-06-01 ENCOUNTER — Encounter: Payer: Self-pay | Admitting: Emergency Medicine

## 2018-06-01 ENCOUNTER — Emergency Department: Payer: Medicare Other

## 2018-06-01 DIAGNOSIS — Z7901 Long term (current) use of anticoagulants: Secondary | ICD-10-CM | POA: Diagnosis not present

## 2018-06-01 DIAGNOSIS — M25561 Pain in right knee: Secondary | ICD-10-CM | POA: Insufficient documentation

## 2018-06-01 DIAGNOSIS — Z79899 Other long term (current) drug therapy: Secondary | ICD-10-CM | POA: Insufficient documentation

## 2018-06-01 DIAGNOSIS — Z8673 Personal history of transient ischemic attack (TIA), and cerebral infarction without residual deficits: Secondary | ICD-10-CM | POA: Diagnosis not present

## 2018-06-01 DIAGNOSIS — Z87891 Personal history of nicotine dependence: Secondary | ICD-10-CM | POA: Insufficient documentation

## 2018-06-01 DIAGNOSIS — Z043 Encounter for examination and observation following other accident: Secondary | ICD-10-CM | POA: Insufficient documentation

## 2018-06-01 DIAGNOSIS — W19XXXA Unspecified fall, initial encounter: Secondary | ICD-10-CM

## 2018-06-01 DIAGNOSIS — M25562 Pain in left knee: Secondary | ICD-10-CM | POA: Diagnosis present

## 2018-06-01 MED ORDER — TRAMADOL HCL 50 MG PO TABS: 50.0000 mg | ORAL_TABLET | Freq: Four times a day (QID) | ORAL | 0 refills | Status: AC | PRN

## 2018-06-01 NOTE — ED Triage Notes (Signed)
Pt in via [facility] from home, reports slipping while in walk in shower, reports bilateral knee pain and right ankle pain.  Vitals WDL, NAD noted at this time.

## 2018-06-01 NOTE — ED Notes (Signed)
Pt. Verbalizes understanding of d/c instructions, and follow-up. Pt. In NAD at time of d/c and denies further concerns regarding this visit. Pt. Stable at the time of departure from the unit, departing unit by the safest and most appropriate manner per that pt condition and limitations with all belongings accounted for. Pt advised to return to the ED at any time for emergent concerns, or for new/worsening symptoms.

## 2018-06-01 NOTE — ED Provider Notes (Signed)
[facility] Emergency Department Provider Note  ____________________________________________  Time seen: Approximately 7:18 PM  I have reviewed the triage vital signs and the nursing notes.   HISTORY  Chief Complaint Fall    HPI Ariel Alvarez is a 72 y.o. female presents to the emergency department after patient lost her balance in her bathtub.  Patient has a history of CVA and residual left-sided weakness.  Patient denies hitting her head or loss of consciousness.  Patient fell onto her bilateral knees.  She reports 7 out of 10 bilateral knee pain and right ankle pain.  No numbness or tingling in the lower extremities.  Patient denies new onset blurry vision, nausea, vomiting, abdominal pain or disorientation.  Patient is accompanied by her sister who has noticed no changes in behavior.  Patient is speaking in complete sentences in the emergency department.   Past Medical History:  Diagnosis Date  . Anxiety   . Collagen vascular disease ([facility])   . Depression   . Stroke [facility])     Patient Active Problem List   Diagnosis Date Noted  . Collagen vascular disease ([facility]) 05/21/2017  . 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 CHOLECYSTECTOMY      Prior to Admission medications   Medication Sig Start Date End Date Taking? Authorizing Provider  acetaminophen (TYLENOL) 325 MG tablet Take 325 mg by mouth. 12/22/08   [provider]  cetirizine (ZYRTEC) 10 MG tablet Take 10 mg by mouth daily.    [provider]  cetirizine (ZYRTEC) 10 MG tablet TAKE 1 TABLET EVERY DAY 03/07/15   [provider]  clonazePAM (KLONOPIN) 1 MG tablet Take 2 mg by mouth at bedtime.    [provider]  eszopiclone (LUNESTA) 2 MG TABS tablet Take 2 mg by mouth at bedtime as needed for sleep. Take immediately before bedtime    [provider]  gabapentin (NEURONTIN) 300 MG capsule  TAKE 2 CAPSULES TWICE A DAY AND TAKE 3 CAPSULES AT BEDTIME 03/12/17   [provider]  levothyroxine (SYNTHROID, LEVOTHROID) 100 MCG tablet Take by mouth. 04/19/17 04/19/18  [provider]  levothyroxine (SYNTHROID, LEVOTHROID) 88 MCG tablet Take 88 mcg by mouth daily before breakfast.    [provider]  Lifitegrast (XIIDRA) 5 % SOLN Xiidra 5 % eye drops in a dropperette  PLACE 1 DROP IN OU BID    [provider]  lubiprostone (AMITIZA) 24 MCG capsule Take 24 mcg by mouth 2 (two) times daily with a meal.    [provider]  nystatin (MYCOSTATIN/NYSTOP) 100000 UNIT/GM POWD Apply to the affected areas 2 to 3 times daily until healing is complete Patient not taking: Reported on 02/25/2018 08/02/15   Renford Dills, NP  nystatin (NYSTATIN) powder Nystop 100,000 unit/gram topical powder  APPLY TO AFFECTED AREA 3 TIMES DAILY    [provider]  omeprazole (PRILOSEC) 20 MG capsule Take 20 mg by mouth daily.    [provider]  oxyCODONE (OXY IR/ROXICODONE) 5 MG immediate release tablet Take 5 mg by mouth. 04/19/17   [provider]  phentermine 30 MG capsule Take 30 mg by mouth every morning.    [provider]  pravastatin (PRAVACHOL) 20 MG tablet Take 20 mg by mouth daily.    [provider]  PRAVASTATIN SODIUM PO Take by mouth.    [provider]  tiZANidine (ZANAFLEX) 4 MG capsule Take  4 mg by mouth 3 (three) times daily.    [provider]  traMADol (ULTRAM) 50 MG tablet Take 1 tablet (50 mg total) by mouth every 6 (six) hours as needed for up to 3 days. 06/01/18 06/04/18  Orvil Feil, PA-C  venlafaxine (EFFEXOR) 50 MG tablet Take 50 mg by mouth 2 (two) times daily.    [provider]  venlafaxine (EFFEXOR) 75 MG tablet Take 75 mg by mouth 2 (two) times daily with a meal.    [provider]  warfarin (COUMADIN) 2.5 MG tablet Take 2.5 mg by mouth.    [provider]   warfarin (COUMADIN) 3 MG tablet Take 3 mg by mouth daily.    [provider]    Allergies Latex  Family History  Problem Relation Age of Onset  . Diabetes Son   . Cancer Paternal Aunt   . Stroke Paternal Grandmother     Social History Social History   Tobacco Use  . Smoking status: Former Smoker    Last attempt to quit: 10/29/1974    Years since quitting: 43.6  . Smokeless tobacco: Never Used  Substance Use Topics  . Alcohol use: Yes  . Drug use: No     Review of Systems  Constitutional: No fever/chills Eyes: No visual changes. No discharge ENT: No upper respiratory complaints. Cardiovascular: no chest pain. Respiratory: no cough. No SOB. Gastrointestinal: No abdominal pain.  No nausea, no vomiting.  No diarrhea.  No constipation. Musculoskeletal: Patient has bilateral knee pain and right ankle pain.  Skin: Negative for rash, abrasions, lacerations, ecchymosis. Neurological: Negative for headaches, focal weakness or numbness.   ____________________________________________   PHYSICAL EXAM:  VITAL SIGNS: ED Triage Vitals  Enc Vitals Group     BP 06/01/18 1718 124/76     Pulse Rate 06/01/18 1718 98     Resp 06/01/18 1718 16     Temp 06/01/18 1718 97.6 F (36.4 C)     Temp Source 06/01/18 1718 Oral     SpO2 06/01/18 1718 97 %     Weight 06/01/18 1720 181 lb (82.1 kg)     Height 06/01/18 1720 5\' 3"  (1.6 m)     Head Circumference --      Peak Flow --      Pain Score 06/01/18 1719 8     Pain Loc --      Pain Edu? --      Excl. in GC? --      Constitutional: Alert and oriented. Well appearing and in no acute distress. Eyes: Conjunctivae are normal. PERRL. EOMI. Head: Atraumatic. ENT:      Ears: TMs are pearly.      Nose: No congestion/rhinnorhea.      Mouth/Throat: Mucous membranes are moist.  Neck: No stridor.  No cervical spine tenderness to palpation. Cardiovascular: Normal rate, regular rhythm. Normal S1 and S2.  Good peripheral  circulation. Respiratory: Normal respiratory effort without tachypnea or retractions. Lungs CTAB. Good air entry to the bases with no decreased or absent breath sounds. Gastrointestinal: Bowel sounds 4 quadrants. Soft and nontender to palpation. No guarding or rigidity. No palpable masses. No distention. No CVA tenderness. Musculoskeletal: Bilateral knees:  negative anterior and posterior drawer test.  No laxity with MCL or LCL testing.  Negative ballottement.  Negative apprehension.  Palpable dorsalis pedis pulse bilaterally and symmetrically.  Right ankle: Patient is able to move all 5 right toes with tenderness elicited over the deltoid ligament.  Palpable dorsalis  pedis pulse bilaterally and symmetrically. Neurologic:  Normal speech and language. No gross focal neurologic deficits are appreciated.  Skin: Patient has mild ecchymosis overlying bilateral knees. Psychiatric: Mood and affect are normal. Speech and behavior are normal. Patient exhibits appropriate insight and judgement.   ____________________________________________   LABS (all labs ordered are listed, but only abnormal results are displayed)  Labs Reviewed - No data to display ____________________________________________  EKG   ____________________________________________  RADIOLOGY I personally viewed and evaluated these images as part of my medical decision making, as well as reviewing the written report by the radiologist.  Dg Ankle Complete Right  Result Date: 06/01/2018 CLINICAL DATA:  Pt c/o bilateral knee and right ankle pain, states she fell and was wedged in between her garden tub. Pt has hx of CVA and has left side weakness. EXAM: RIGHT ANKLE - COMPLETE 3+ VIEW COMPARISON:  None. FINDINGS: There is no evidence of fracture, dislocation, or joint effusion. There is no evidence of arthropathy or other focal bone abnormality. Soft tissues are unremarkable. IMPRESSION: Negative. Electronically Signed   By: Norva Pavlov M.D.   On: 06/01/2018 18:39   Dg Knee Complete 4 Views Left  Result Date: 06/01/2018 CLINICAL DATA:  Pt c/o bilateral knee and right ankle pain, states she fell and was wedged in between her garden tub. Pt has hx of CVA and has left side weakness. EXAM: LEFT KNEE - COMPLETE 4+ VIEW COMPARISON:  None. FINDINGS: There is mild joint space narrowing involving the MEDIAL compartment. No acute fracture or subluxation. No joint effusion. IMPRESSION: No evidence for acute  abnormality.  Mild degenerative changes. Electronically Signed   By: Norva Pavlov M.D.   On: 06/01/2018 18:38   Dg Knee Complete 4 Views Right  Result Date: 06/01/2018 CLINICAL DATA:  Pt c/o bilateral knee and right ankle pain, states she fell and was wedged in between her garden tub. Pt has hx of CVA and has left side weakness. EXAM: RIGHT KNEE - COMPLETE 4+ VIEW COMPARISON:  None. FINDINGS: There is mild joint space narrowing of the MEDIAL and patellofemoral compartments. No acute fracture or subluxation. No joint effusion. IMPRESSION: No evidence for acute  abnormality.  Mild degenerative changes. Electronically Signed   By: Norva Pavlov M.D.   On: 06/01/2018 18:36    ____________________________________________    PROCEDURES  Procedure(s) performed:    Procedures    Medications - No data to display   ____________________________________________   INITIAL IMPRESSION / ASSESSMENT AND PLAN / ED COURSE  Pertinent labs & imaging results that were available during my care of the patient were reviewed by me and considered in my medical decision making (see chart for details).  Review of the [place] CSRS was performed in accordance of the [employer] prior to dispensing any controlled drugs.      Assessment and fall Contusion Patient presents to the emergency department with bilateral knee pain and right ankle pain.  Patient assured me that she had not hit her head.  Overall physical exam is reassuring and x-ray  examination of the bilateral knees and right ankle revealed no acute abnormality.  An Ace wrap was applied in the emergency department.  Patient was discharged with a brief course of tramadol.  She was advised to follow-up with primary care as needed.  Strict return precautions were given to return to the emergency department for new or worsening symptoms.  All patient questions were answered.     ____________________________________________  FINAL CLINICAL IMPRESSION(S) / ED  DIAGNOSES  Final diagnoses:  Fall, initial encounter      NEW MEDICATIONS STARTED DURING THIS VISIT:  ED Discharge Orders        Ordered    traMADol (ULTRAM) 50 MG tablet  Every 6 hours PRN     06/01/18 1912          This chart was dictated using voice recognition software/Dragon. Despite best efforts to proofread, errors can occur which can change the meaning. Any change was purely unintentional.    Orvil Feil, PA-C 06/01/18 1932    Willy Eddy, MD 06/01/18 [id number]

## 2018-06-01 NOTE — ED Notes (Signed)
Pt c/o bilateral leg pain, states she fell and was wedged in between her garden tub.  Pt has hx of CVA and has left side weakness.  Pt required max assist to get into the bed.

## 2018-06-01 NOTE — ED Triage Notes (Addendum)
First Nurse Note:  Arrives from home via [facility].  While walking into walk in tub, leg gave out and fell to ground.  C/O bilateral knee pain and right ankle pain.

## 2018-06-02 ENCOUNTER — Ambulatory Visit: Payer: Medicare Other

## 2018-06-04 ENCOUNTER — Ambulatory Visit: Payer: Medicare Other | Attending: Internal Medicine

## 2018-06-04 DIAGNOSIS — R2689 Other abnormalities of gait and mobility: Secondary | ICD-10-CM | POA: Insufficient documentation

## 2018-06-04 DIAGNOSIS — R278 Other lack of coordination: Secondary | ICD-10-CM

## 2018-06-04 DIAGNOSIS — M6281 Muscle weakness (generalized): Secondary | ICD-10-CM | POA: Diagnosis present

## 2018-06-04 NOTE — Therapy (Signed)
[place] MAIN [facility] SERVICES [address] [place], [place], [address] Phone: [phone]   Fax:  [phone]  Physical Therapy Treatment  Patient Details  Name: Ariel Alvarez MRN: [id number] Date of Birth: 09-Sep-1946 Referring Provider: Dr. Paula Compton   Encounter Date: 06/04/2018  PT End of Session - 06/04/18 1352    Visit Number  20    Number of Visits  32    Date for PT Re-Evaluation  06/09/18    Authorization Type  2/10) PN start 05/26/18    PT Start Time  1304    PT Stop Time  1345    PT Time Calculation (min)  41 min    Equipment Utilized During Treatment  Gait belt    Activity Tolerance  Patient limited by fatigue;Patient tolerated treatment well    Behavior During Therapy  Ariel Alvarez for tasks assessed/performed       Past Medical History:  Diagnosis Date  . Anxiety   . Collagen vascular disease ([place])   . Depression   . Stroke [place] [facility])     Past Surgical History:  Procedure Laterality Date  . CHOLECYSTECTOMY      There were no vitals filed for this visit.  Subjective Assessment - 06/04/18 1306    Subjective  Patient fell sunday when geting out of the shower . Stepped with the R foot and lost balance. Rolled R ankle and went to emergency room and had x rays done. Reports that it is a bad sprain but everything looks good.     Patient is accompained by:  Family member    Pertinent History  Ariel Alvarez is a 72 y.o. 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 Feels like her walking and stability is decreased. Home Health PT stopped about 3 months ago.  Walked last year prior to stroke 01/2017 which then affected bilateral legs. Was in nursing home and fell out of bed and fractured L shoulder. Uses a walker and a lift chair to take a few steps. Stands at home with husband.     Limitations  Walking    How long can you sit comfortably?  n/a    How long can you stand  comfortably?  5-6 minutes    How long can you walk comfortably?  4 steps    Patient Stated Goals  Patient wants to become more independent, walk and stand better.     Currently in Pain?  No/denies     Was told to stay off of R ankle. Was wrapping ankle and told she could take it off yesterday. Was told if it hurts stay off of it.  Has been walking very little.     TREATMENT Standing marching in // bars 10x each leg, SUE support; 2 sets : tactile support to L quadriceps to maintain knee extension  Standing hip extension : unable to perform duet to L knee flexion/giving   Standing abduction 10x each leg, SUE support; terminated due to dizziness  2lb ankle weights  Hip flexion ; PT feet in between for tactile support of ankle and knee alignment 2x10    LAQ 10x     seated soccer kicks: 10x each leg, to promote coordination and muscle contraction.   Seated soccer goal kicks: 10x each leg in between two cones  Posture education with weight shift over L side to promote equal upright posture.   GTB adduction 10x each leg  PT Education - 06/04/18 1351    Education provided  Yes    Education Details  weight shift, exercise technique    [name]  Patient    Methods  Explanation;Demonstration;Verbal cues    Comprehension  Verbalized understanding;Returned demonstration       PT Short Term Goals - 04/14/18 1320      PT SHORT TERM GOAL #1   Title  Patient will perform one sit to stand independently to increase independence with transfers    Baseline  4/30: Min A 6/17: able to perform     Time  2    Period  Weeks    Status  Achieved      PT SHORT TERM GOAL #2   Title  Patient will be independent in home exercise program to improve strength/mobility for better functional independence with ADLs.    Baseline  4/30: exercises given 6/17: doing HEP 3x/week     Time  2    Period  Weeks    Status  Partially Met      PT SHORT TERM GOAL  #3   Title  Patient will ambulate 10 ft with hemiwalker and CGA for improved home mobility    Baseline  ambulate 5 ft.  6/17: 28 ft     Time  2    Period  Weeks    Status  Achieved      PT SHORT TERM GOAL #4   Title  Patient will increase BLE gross strength to 4-/5 as to improve functional strength for independent gait, increased standing tolerance and increased ADL ability.    Baseline  4/30: 3/5 6/17: 4-/10     Time  2    Period  Weeks    Status  Achieved        PT Long Term Goals - 05/26/18 1319      PT LONG TERM GOAL #1   Title  Patient will perform 5 STS < 20 seconds independently with hemi walker to demonstrate improved home mobility.     Baseline  4/30: require min A for one 6/17: 5x STS: 65 seconds with min A for stability at top position last 2 sit to stands. 7/29: 41 seconds with hemiwalker    Time  8    Period  Weeks    Status  Partially Met    Target Date  07/21/18      PT LONG TERM GOAL #2   Title  Patient will increase ABC scale score >50% to demonstrate better functional mobility and better confidence with ADLs.     Baseline  4/30: 7.5% 6/17: 8%; 7/29: 30%    Time  8    Period  Weeks    Status  Partially Met    Target Date  07/21/18      PT LONG TERM GOAL #3   Title  Patient will ambulate 60 ft with hemi walker and CGA to improve home mobility and independence with gait.     Baseline  4/30: ambulate 5 ft; 6/17P 28 ft with CGA and hemiwalker 7/29: 17 ft with CGA and hemiwalker, fatigue due to previous bathroom mobility.     Time  8    Period  Weeks    Status  Partially Met      PT LONG TERM GOAL #4   Title  Patient will increase BLE gross strength to 4+/5 as to improve functional strength for independent gait, increased standing tolerance and increased ADL ability    Baseline  4/30: 3/5  6/17 : 4-/5    Time  8    Period  Weeks    Status  Partially Met    Target Date  07/21/18      PT LONG TERM GOAL #5   Title  Patient will increase lower extremity  functional scale to >30/80 to demonstrate improved functional mobility and increased tolerance with ADLs.     Baseline  4/30: 9/80 6/17: 28/80 7/29: 17/80     Time  8    Period  Weeks    Status  Partially Met    Target Date  07/21/18            Plan - 06/04/18 1458    Clinical Impression Statement  Patient fearful of LOB due to fall on Sunday. Patient challenged with weight shift over affected limb resulting in excessive weight on RLE (limb sprained in fall). Seated interventions focused on upright midline posture performed with patient requiring frequent verbal cueing for correction. Patient will continue to benefit from skilled physical therapy to improve gait mechanics, strength, and independence    Rehab Potential  Fair    Clinical Impairments Affecting Rehab Potential  (+) good family support (-) year since stroke    PT Frequency  2x / week    PT Duration  8 weeks    PT Treatment/Interventions  ADLs/Self Care Home Management;Cryotherapy;Electrical Stimulation;Moist Heat;Traction;Ultrasound;Therapeutic exercise;Therapeutic activities;Functional mobility training;Stair training;Gait training;DME Instruction;Balance training;Neuromuscular re-education;Patient/family education;Orthotic Fit/Training;Manual techniques;Passive range of motion;Energy conservation;Visual/perceptual remediation/compensation    PT Next Visit Plan  transfer, sts, LE strength    PT Home Exercise Plan  see sheet    Consulted and Agree with Plan of Care  Patient;Family member/caregiver    Family Member Consulted  spouse       Patient will benefit from skilled therapeutic intervention in order to improve the following deficits and impairments:  Decreased strength, Impaired UE functional use, Impaired tone, Difficulty walking, Abnormal gait, Decreased activity tolerance, Decreased balance, Decreased knowledge of precautions, Decreased endurance, Decreased coordination, Decreased knowledge of use of DME, Decreased  mobility, Decreased safety awareness, Impaired perceived functional ability, Impaired flexibility, Impaired sensation, Improper body mechanics, Postural dysfunction  Visit Diagnosis: Other abnormalities of gait and mobility  Muscle weakness (generalized)  Other lack of coordination     Problem List Patient Active Problem List   Diagnosis Date Noted  . Collagen vascular disease ([place]) 05/21/2017  . Gangrene of finger of right hand ([place]) 05/21/2017   Janna Arch, PT, DPT   06/04/2018, 3:00 PM  [place] MAIN [facility] SERVICES [address] [place], [place], [address] Phone: [phone]   Fax:  [phone]  Name: ALIE MOUDY MRN: [id number] Date of Birth: Feb 02, 1946

## 2018-06-10 ENCOUNTER — Ambulatory Visit: Payer: Medicare Other

## 2018-06-10 DIAGNOSIS — R278 Other lack of coordination: Secondary | ICD-10-CM

## 2018-06-10 DIAGNOSIS — R2689 Other abnormalities of gait and mobility: Secondary | ICD-10-CM

## 2018-06-10 DIAGNOSIS — M6281 Muscle weakness (generalized): Secondary | ICD-10-CM

## 2018-06-10 NOTE — Therapy (Signed)
[place] MAIN [facility] SERVICES [address] [place], [place], [address] Phone: [phone]   Fax:  [phone]  Physical Therapy Treatment  Patient Details  Name: Ariel Alvarez MRN: [id number] Date of Birth: 04/13/1946 Referring Provider: Dr. Paula Compton   Encounter Date: 06/10/2018  PT End of Session - 06/10/18 1421    Visit Number  21    Number of Visits  29    Date for PT Re-Evaluation  07/08/18    Authorization Type  3/10) PN start 05/26/18    PT Start Time  1330    PT Stop Time  1414    PT Time Calculation (min)  44 min    Equipment Utilized During Treatment  Gait belt    Activity Tolerance  Patient limited by fatigue;Patient tolerated treatment well    Behavior During Therapy  [facility] for tasks assessed/performed       Past Medical History:  Diagnosis Date  . Anxiety   . Collagen vascular disease ([place])   . Depression   . Stroke [facility])     Past Surgical History:  Procedure Laterality Date  . CHOLECYSTECTOMY      There were no vitals filed for this visit.  Subjective Assessment - 06/10/18 1334    Subjective  Patient reports having pain in R hip that began Sunday, hurts when she moves it up to get onto bed. Patient reports compliance with HEP.     Patient is accompained by:  Family member    Pertinent History  Ariel Alvarez is a 72 y.o. 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 Feels like her walking and stability is decreased. Home Health PT stopped about 3 months ago.  Walked last year prior to stroke 01/2017 which then affected bilateral legs. Was in nursing home and fell out of bed and fractured L shoulder. Uses a walker and a lift chair to take a few steps. Stands at home with husband.     Limitations  Walking    How long can you sit comfortably?  n/a    How long can you stand comfortably?  5-6 minutes    How long can you walk comfortably?  4 steps    Patient  Stated Goals  Patient wants to become more independent, walk and stand better.     Currently in Pain?  Yes    Pain Score  7     Pain Location  Hip    Pain Orientation  Right    Pain Descriptors / Indicators  Aching    Pain Type  Acute pain    Pain Onset  1 to 4 weeks ago    Pain Frequency  Intermittent       HEP  5x STS: 82 seconds ; decreased use of LLE than normal pain in the back of both legs with R knee cap hurting.   ABC: 15.6% 60 ft: 21 ft with hemiwalker  BLE strength   Right Left  Hip flexion 4/5 3-/5  Hip Abduction 4/5 4-/5  Hip Adduction 4/5 4-/5  Knee Extension  4/5 4-/5  Knee Flexion 4/5 4-/5  DF 4/5 4-/5  PF 4/5 4-/5     LEFS: 20/80   Ambulate 15 ft, 54f, and 10 ft with hemiwalker and CGA with WC follow, Required seated rest breaks and cueing for sequencing of task.    Patient educated on 4 week trial period for progression. Patient verbalized understanding.  PT Education - 06/10/18 1421    Education provided  Yes    Education Details  need for continued ambulation at home, 4 week trial period     [employer]) Educated  Patient    Methods  Explanation;Demonstration;Verbal cues    Comprehension  Verbalized understanding;Returned demonstration       PT Short Term Goals - 06/10/18 1425      PT SHORT TERM GOAL #1   Title  Patient will perform one sit to stand independently to increase independence with transfers    Baseline  4/30: Min A 6/17: able to perform     Time  2    Period  Weeks    Status  Achieved      PT SHORT TERM GOAL #2   Title  Patient will be independent in home exercise program to improve strength/mobility for better functional independence with ADLs.    Baseline  4/30: exercises given 6/17: doing HEP 3x/week     Time  2    Period  Weeks    Status  Partially Met      PT SHORT TERM GOAL #3   Title  Patient will ambulate 10 ft with hemiwalker and CGA for improved home mobility    Baseline  ambulate  5 ft.  6/17: 28 ft     Time  2    Period  Weeks    Status  Achieved      PT SHORT TERM GOAL #4   Title  Patient will increase BLE gross strength to 4-/5 as to improve functional strength for independent gait, increased standing tolerance and increased ADL ability.    Baseline  4/30: 3/5 6/17: 4-/10     Time  2    Period  Weeks    Status  Achieved        PT Long Term Goals - 06/10/18 1358      PT LONG TERM GOAL #1   Title  Patient will perform 5 STS < 20 seconds independently with hemi walker to demonstrate improved home mobility.     Baseline  4/30: require min A for one 6/17: 5x STS: 65 seconds with min A for stability at top position last 2 sit to stands. 7/29: 41 seconds with hemiwalker 8/13: 82 seconds     Time  4    Period  Weeks    Status  Partially Met    Target Date  07/08/18      PT LONG TERM GOAL #2   Title  Patient will increase ABC scale score >50% to demonstrate better functional mobility and better confidence with ADLs.     Baseline  4/30: 7.5% 6/17: 8%; 7/29: 30% 8/13: 15.6%    Time  4    Period  Weeks    Status  Partially Met    Target Date  07/08/18      PT LONG TERM GOAL #3   Title  Patient will ambulate 60 ft with hemi walker and CGA to improve home mobility and independence with gait.     Baseline  4/30: ambulate 5 ft; 6/17P 28 ft with CGA and hemiwalker 7/29: 17 ft with CGA and hemiwalker, fatigue due to previous bathroom mobility. ; 8/13: 21 ft with hemiwalker    Time  4    Period  Weeks    Status  Partially Met    Target Date  07/08/18      PT LONG TERM GOAL #4   Title  Patient will  increase BLE gross strength to 4+/5 as to improve functional strength for independent gait, increased standing tolerance and increased ADL ability    Baseline  4/30: 3/5  6/17 : 4-/5 8/13: 4-/5     Time  4    Period  Weeks    Status  Partially Met    Target Date  07/08/18      PT LONG TERM GOAL #5   Title  Patient will increase lower extremity functional scale to  >30/80 to demonstrate improved functional mobility and increased tolerance with ADLs.     Baseline  4/30: 9/80 6/17: 28/80 7/29: 17/80 8/13: 20/80     Time  4    Period  Weeks    Status  Partially Met    Target Date  07/08/18            Plan - 06/10/18 1424    Clinical Impression Statement  Due to patient's recent fall and illness we will allow for four week trial period for improvement to assess continued need for therapy.  This comes from the large variation in scores from two weeks ago ( a week prior to fall) and today indicating decreased mobility, transfers, and confidence may be correlated with fall. Prior to fall patient was progressing in goals. Patient is now fearful of falling/ LOB since her fall resulting in decreased weight shift when nervous. Patient will continue to benefit from skilled physical therapy to improve gait mechanics, strength, and independence    Rehab Potential  Fair    Clinical Impairments Affecting Rehab Potential  (+) good family support (-) year since stroke    PT Frequency  2x / week    PT Duration  4 weeks    PT Treatment/Interventions  ADLs/Self Care Home Management;Cryotherapy;Electrical Stimulation;Moist Heat;Traction;Ultrasound;Therapeutic exercise;Therapeutic activities;Functional mobility training;Stair training;Gait training;DME Instruction;Balance training;Neuromuscular re-education;Patient/family education;Orthotic Fit/Training;Manual techniques;Passive range of motion;Energy conservation;Visual/perceptual remediation/compensation    PT Next Visit Plan  transfer, sts, LE strength    PT Home Exercise Plan  see sheet    Consulted and Agree with Plan of Care  Patient;Family member/caregiver    Family Member Consulted  spouse       Patient will benefit from skilled therapeutic intervention in order to improve the following deficits and impairments:  Decreased strength, Impaired UE functional use, Impaired tone, Difficulty walking, Abnormal gait,  Decreased activity tolerance, Decreased balance, Decreased knowledge of precautions, Decreased endurance, Decreased coordination, Decreased knowledge of use of DME, Decreased mobility, Decreased safety awareness, Impaired perceived functional ability, Impaired flexibility, Impaired sensation, Improper body mechanics, Postural dysfunction  Visit Diagnosis: Other abnormalities of gait and mobility - Plan: PT plan of care cert/re-cert  Muscle weakness (generalized) - Plan: PT plan of care cert/re-cert  Other lack of coordination - Plan: PT plan of care cert/re-cert     Problem List Patient Active Problem List   Diagnosis Date Noted  . Collagen vascular disease ([place]) 05/21/2017  . Gangrene of finger of right hand ([place]) 05/21/2017   Janna Arch, PT, DPT   06/10/2018, 2:28 PM  [place] MAIN [facility] SERVICES [address] [place], [place], [address] Phone: [phone]   Fax:  [phone]  Name: Ariel Alvarez MRN: [id number] Date of Birth: 27-Jul-1946

## 2018-06-16 ENCOUNTER — Ambulatory Visit: Payer: Medicare Other

## 2018-06-16 DIAGNOSIS — R2689 Other abnormalities of gait and mobility: Secondary | ICD-10-CM | POA: Diagnosis not present

## 2018-06-16 DIAGNOSIS — M6281 Muscle weakness (generalized): Secondary | ICD-10-CM

## 2018-06-16 DIAGNOSIS — R278 Other lack of coordination: Secondary | ICD-10-CM

## 2018-06-16 NOTE — Therapy (Signed)
[place] MAIN [facility] SERVICES [address] [place], [place], [address] Phone: [phone]   Fax:  [phone]  Physical Therapy Treatment  Patient Details  Name: Ariel Alvarez MRN: [id number] Date of Birth: June 02, 1946 Referring Provider: Dr. Paula Compton   Encounter Date: 06/16/2018  PT End of Session - 06/16/18 1311    Visit Number  22    Number of Visits  29    Date for PT Re-Evaluation  07/08/18    Authorization Type  4/10) PN start 05/26/18    PT Start Time  1300    PT Stop Time  1345    PT Time Calculation (min)  45 min    Equipment Utilized During Treatment  Gait belt    Activity Tolerance  Patient limited by fatigue;Patient tolerated treatment well    Behavior During Therapy  [facility] for tasks assessed/performed       Past Medical History:  Diagnosis Date  . Anxiety   . Collagen vascular disease ([place])   . Depression   . Stroke [facility])     Past Surgical History:  Procedure Laterality Date  . CHOLECYSTECTOMY      There were no vitals filed for this visit.  Subjective Assessment - 06/16/18 1308    Subjective  Patient reports she went to doctor and reports that doctor said she has arthritis in her feet. Patient reports compliance with HEP.     Patient is accompained by:  Family member    Pertinent History  Ariel Alvarez is a 72 y.o. 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 Feels like her walking and stability is decreased. Home Health PT stopped about 3 months ago.  Walked last year prior to stroke 01/2017 which then affected bilateral legs. Was in nursing home and fell out of bed and fractured L shoulder. Uses a walker and a lift chair to take a few steps. Stands at home with husband.     Limitations  Walking    How long can you sit comfortably?  n/a    How long can you stand comfortably?  5-6 minutes    How long can you walk comfortably?  4 steps    Patient Stated  Goals  Patient wants to become more independent, walk and stand better.     Currently in Pain?  No/denies        TREATMENT  SPT from WC to Nustep and back requiring Mod A for transfer and max verbal cueing.     Nustep Lvl 1 3 minutes RPM >60 LE only for cardiovascular and strengthening.    Heel toe raises 15x seated    Seated hamstring stretch ; bilateral LE 2x60 seconds each leg     Seated marches 15x each leg   Seated LAQ 10x 3 seconds; half foam roller between feet for widening BOS  Seated adduction squeezes 10x 3 seconds  Standing in // bars:   Marching 10x each leg; Mod A for weight shift onto L foot  Step back (extensions) 10x each leg. ; difficulty maintaining BOS; frequently stepping onto foot  Step forward (flexion) 10x each leg ;  Seated posture and standing posture for equal weight shift; terminated early due to L knee buckling.   Standing: static balance with single UE support weight shift onto LUE. ; use of full length mirror for midline positioning. Mod A for correction   Patient requires max verbal cueing for alignment of  feet for proper COM and midline. Patient's natural midline has shifted to the R requiring verbal and tactile cueing for correction.   Pt. response to medical necessity:  Patient will continue to benefit from skilled physical therapy to improve gait mechanics, strength, and independence                        PT Education - 06/16/18 1310    Education provided  Yes    Education Details  exercise technique    [name]  Patient    Methods  Explanation;Demonstration;Verbal cues    Comprehension  Verbalized understanding;Returned demonstration       PT Short Term Goals - 06/10/18 1425      PT SHORT TERM GOAL #1   Title  Patient will perform one sit to stand independently to increase independence with transfers    Baseline  4/30: Min A 6/17: able to perform     Time  2    Period  Weeks    Status  Achieved       PT SHORT TERM GOAL #2   Title  Patient will be independent in home exercise program to improve strength/mobility for better functional independence with ADLs.    Baseline  4/30: exercises given 6/17: doing HEP 3x/week     Time  2    Period  Weeks    Status  Partially Met      PT SHORT TERM GOAL #3   Title  Patient will ambulate 10 ft with hemiwalker and CGA for improved home mobility    Baseline  ambulate 5 ft.  6/17: 28 ft     Time  2    Period  Weeks    Status  Achieved      PT SHORT TERM GOAL #4   Title  Patient will increase BLE gross strength to 4-/5 as to improve functional strength for independent gait, increased standing tolerance and increased ADL ability.    Baseline  4/30: 3/5 6/17: 4-/10     Time  2    Period  Weeks    Status  Achieved        PT Long Term Goals - 06/10/18 1358      PT LONG TERM GOAL #1   Title  Patient will perform 5 STS < 20 seconds independently with hemi walker to demonstrate improved home mobility.     Baseline  4/30: require min A for one 6/17: 5x STS: 65 seconds with min A for stability at top position last 2 sit to stands. 7/29: 41 seconds with hemiwalker 8/13: 82 seconds     Time  4    Period  Weeks    Status  Partially Met    Target Date  07/08/18      PT LONG TERM GOAL #2   Title  Patient will increase ABC scale score >50% to demonstrate better functional mobility and better confidence with ADLs.     Baseline  4/30: 7.5% 6/17: 8%; 7/29: 30% 8/13: 15.6%    Time  4    Period  Weeks    Status  Partially Met    Target Date  07/08/18      PT LONG TERM GOAL #3   Title  Patient will ambulate 60 ft with hemi walker and CGA to improve home mobility and independence with gait.     Baseline  4/30: ambulate 5 ft; 6/17P 28 ft with CGA and hemiwalker 7/29: 17  ft with CGA and hemiwalker, fatigue due to previous bathroom mobility. ; 8/13: 21 ft with hemiwalker    Time  4    Period  Weeks    Status  Partially Met    Target Date  07/08/18       PT LONG TERM GOAL #4   Title  Patient will increase BLE gross strength to 4+/5 as to improve functional strength for independent gait, increased standing tolerance and increased ADL ability    Baseline  4/30: 3/5  6/17 : 4-/5 8/13: 4-/5     Time  4    Period  Weeks    Status  Partially Met    Target Date  07/08/18      PT LONG TERM GOAL #5   Title  Patient will increase lower extremity functional scale to >30/80 to demonstrate improved functional mobility and increased tolerance with ADLs.     Baseline  4/30: 9/80 6/17: 28/80 7/29: 17/80 8/13: 20/80     Time  4    Period  Weeks    Status  Partially Met    Target Date  07/08/18            Plan - 06/16/18 1326    Clinical Impression Statement  Patient challenged with following commands and sequencing of tasks. Limited weight shift onto LLE requires Mod A from PT to perform and max coaxing due to fear. Patient has right weight shift/COM requiring frequent cueing for re-orientation to midline.  Patient will continue to benefit from skilled physical therapy to improve gait mechanics, strength, and independence    Rehab Potential  Fair    Clinical Impairments Affecting Rehab Potential  (+) good family support (-) year since stroke    PT Frequency  2x / week    PT Duration  4 weeks    PT Treatment/Interventions  ADLs/Self Care Home Management;Cryotherapy;Electrical Stimulation;Moist Heat;Traction;Ultrasound;Therapeutic exercise;Therapeutic activities;Functional mobility training;Stair training;Gait training;DME Instruction;Balance training;Neuromuscular re-education;Patient/family education;Orthotic Fit/Training;Manual techniques;Passive range of motion;Energy conservation;Visual/perceptual remediation/compensation    PT Next Visit Plan  transfer, sts, LE strength    PT Home Exercise Plan  see sheet    Consulted and Agree with Plan of Care  Patient;Family member/caregiver    Family Member Consulted  spouse       Patient will benefit  from skilled therapeutic intervention in order to improve the following deficits and impairments:  Decreased strength, Impaired UE functional use, Impaired tone, Difficulty walking, Abnormal gait, Decreased activity tolerance, Decreased balance, Decreased knowledge of precautions, Decreased endurance, Decreased coordination, Decreased knowledge of use of DME, Decreased mobility, Decreased safety awareness, Impaired perceived functional ability, Impaired flexibility, Impaired sensation, Improper body mechanics, Postural dysfunction  Visit Diagnosis: Other abnormalities of gait and mobility  Muscle weakness (generalized)  Other lack of coordination     Problem List Patient Active Problem List   Diagnosis Date Noted  . Collagen vascular disease ([place]) 05/21/2017  . 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 [place], [place], [address] Phone: [phone]   Fax:  [phone]  Name: Ariel Alvarez MRN: [id number] Date of Birth: Oct 19, 1946

## 2018-06-18 ENCOUNTER — Ambulatory Visit: Payer: Medicare Other

## 2018-06-18 DIAGNOSIS — R2689 Other abnormalities of gait and mobility: Secondary | ICD-10-CM

## 2018-06-18 DIAGNOSIS — R278 Other lack of coordination: Secondary | ICD-10-CM

## 2018-06-18 DIAGNOSIS — M6281 Muscle weakness (generalized): Secondary | ICD-10-CM

## 2018-06-18 NOTE — Therapy (Signed)
[place] MAIN [facility] SERVICES [address] [place], [place], [address] Phone: [phone]   Fax:  [phone]  Physical Therapy Treatment  Patient Details  Name: Ariel Alvarez MRN: [id number] Date of Birth: 1946-05-21 Referring Provider: Dr. Paula Compton   Encounter Date: 06/18/2018  PT End of Session - 06/18/18 1310    Visit Number  23    Number of Visits  29    Date for PT Re-Evaluation  07/08/18    Authorization Type  5/10) PN start 05/26/18    PT Start Time  1300    PT Stop Time  1345    PT Time Calculation (min)  45 min    Equipment Utilized During Treatment  Gait belt    Activity Tolerance  Patient limited by fatigue;Patient tolerated treatment well    Behavior During Therapy  [facility] for tasks assessed/performed       Past Medical History:  Diagnosis Date  . Anxiety   . Collagen vascular disease ([place])   . Depression   . Stroke [facility])     Past Surgical History:  Procedure Laterality Date  . CHOLECYSTECTOMY      There were no vitals filed for this visit.  Subjective Assessment - 06/18/18 1308    Subjective  Patient reports having pain in her kneecaps, bilaterally starting yesterday. Reports compliance with HEP    Patient is accompained by:  Family member    Pertinent History  Ariel Alvarez is a 72 y.o. 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 Feels like her walking and stability is decreased. Home Health PT stopped about 3 months ago.  Walked last year prior to stroke 01/2017 which then affected bilateral legs. Was in nursing home and fell out of bed and fractured L shoulder. Uses a walker and a lift chair to take a few steps. Stands at home with husband.     Limitations  Walking    How long can you sit comfortably?  n/a    How long can you stand comfortably?  5-6 minutes    How long can you walk comfortably?  4 steps    Patient Stated Goals  Patient wants to  become more independent, walk and stand better.     Currently in Pain?  Yes    Pain Score  4     Pain Location  Knee    Pain Orientation  Right;Left    Pain Descriptors / Indicators  Aching    Pain Type  Acute pain    Pain Onset  Yesterday    Pain Frequency  Intermittent         SPT from WC to Nustep and back requiring Mod A for transfer and max verbal cueing.     Nustep Lvl 1 3 minutes RPM >60 LE only for cardiovascular and strengthening.   Patient attempted ambulation ; was too weak and unstable to complete  3x STS  Vitals: 146/73 pulse 86 Sp02: 91-98 pulse 86;    Heel toe raises 15x seated    Seated hamstring stretch ; bilateral LE 2x60 seconds each leg     Seated marches 15x each leg;  2 sets   Seated LAQ 10x 3 seconds; half foam roller between feet for widening BOS   Seated adduction squeezes 10x 3 seconds  Seated abduction RTB 15x  Seated adduction RTB 10x each leg PT holding opp end of band.   Resisted hamstring curls  RTB 10x each leg   Breathing education: in through nose, out through mouth to maintain oxygeneration levels while exercising.    Patient requires frequent verbal cueing for breathing due to lowered Sp02 levels      Patient requires max verbal cueing for alignment of feet for proper COM and midline. Patient's natural midline has shifted to the R requiring verbal and tactile cueing for correction.    Pt. response to medical necessity:  Patient will continue to benefit from skilled physical therapy to improve gait mechanics, strength, and independence                        PT Education - 06/18/18 1310    Education provided  Yes    Education Details  exercise technique     [name]  Patient    Methods  Explanation;Demonstration;Verbal cues    Comprehension  Verbalized understanding;Returned demonstration       PT Short Term Goals - 06/10/18 1425      PT SHORT TERM GOAL #1   Title  Patient will perform one  sit to stand independently to increase independence with transfers    Baseline  4/30: Min A 6/17: able to perform     Time  2    Period  Weeks    Status  Achieved      PT SHORT TERM GOAL #2   Title  Patient will be independent in home exercise program to improve strength/mobility for better functional independence with ADLs.    Baseline  4/30: exercises given 6/17: doing HEP 3x/week     Time  2    Period  Weeks    Status  Partially Met      PT SHORT TERM GOAL #3   Title  Patient will ambulate 10 ft with hemiwalker and CGA for improved home mobility    Baseline  ambulate 5 ft.  6/17: 28 ft     Time  2    Period  Weeks    Status  Achieved      PT SHORT TERM GOAL #4   Title  Patient will increase BLE gross strength to 4-/5 as to improve functional strength for independent gait, increased standing tolerance and increased ADL ability.    Baseline  4/30: 3/5 6/17: 4-/10     Time  2    Period  Weeks    Status  Achieved        PT Long Term Goals - 06/10/18 1358      PT LONG TERM GOAL #1   Title  Patient will perform 5 STS < 20 seconds independently with hemi walker to demonstrate improved home mobility.     Baseline  4/30: require min A for one 6/17: 5x STS: 65 seconds with min A for stability at top position last 2 sit to stands. 7/29: 41 seconds with hemiwalker 8/13: 82 seconds     Time  4    Period  Weeks    Status  Partially Met    Target Date  07/08/18      PT LONG TERM GOAL #2   Title  Patient will increase ABC scale score >50% to demonstrate better functional mobility and better confidence with ADLs.     Baseline  4/30: 7.5% 6/17: 8%; 7/29: 30% 8/13: 15.6%    Time  4    Period  Weeks    Status  Partially Met    Target Date  07/08/18  PT LONG TERM GOAL #3   Title  Patient will ambulate 60 ft with hemi walker and CGA to improve home mobility and independence with gait.     Baseline  4/30: ambulate 5 ft; 6/17P 28 ft with CGA and hemiwalker 7/29: 17 ft with CGA and  hemiwalker, fatigue due to previous bathroom mobility. ; 8/13: 21 ft with hemiwalker    Time  4    Period  Weeks    Status  Partially Met    Target Date  07/08/18      PT LONG TERM GOAL #4   Title  Patient will increase BLE gross strength to 4+/5 as to improve functional strength for independent gait, increased standing tolerance and increased ADL ability    Baseline  4/30: 3/5  6/17 : 4-/5 8/13: 4-/5     Time  4    Period  Weeks    Status  Partially Met    Target Date  07/08/18      PT LONG TERM GOAL #5   Title  Patient will increase lower extremity functional scale to >30/80 to demonstrate improved functional mobility and increased tolerance with ADLs.     Baseline  4/30: 9/80 6/17: 28/80 7/29: 17/80 8/13: 20/80     Time  4    Period  Weeks    Status  Partially Met    Target Date  07/08/18            Plan - 06/18/18 1343    Clinical Impression Statement  Patient requires Sp02 levels to be monitored, frequently reminding patient to breathe. Frequent stops of exercise performed to raise Sp02 levels. Note written to husband about monitoring oxygen at home. Patient will continue to benefit from skilled physical therapy to improve gait mechanics, strength, and independence    Rehab Potential  Fair    Clinical Impairments Affecting Rehab Potential  (+) good family support (-) year since stroke    PT Frequency  2x / week    PT Duration  4 weeks    PT Treatment/Interventions  ADLs/Self Care Home Management;Cryotherapy;Electrical Stimulation;Moist Heat;Traction;Ultrasound;Therapeutic exercise;Therapeutic activities;Functional mobility training;Stair training;Gait training;DME Instruction;Balance training;Neuromuscular re-education;Patient/family education;Orthotic Fit/Training;Manual techniques;Passive range of motion;Energy conservation;Visual/perceptual remediation/compensation    PT Next Visit Plan  transfer, sts, LE strength    PT Home Exercise Plan  see sheet    Consulted and  Agree with Plan of Care  Patient;Family member/caregiver    Family Member Consulted  spouse       Patient will benefit from skilled therapeutic intervention in order to improve the following deficits and impairments:  Decreased strength, Impaired UE functional use, Impaired tone, Difficulty walking, Abnormal gait, Decreased activity tolerance, Decreased balance, Decreased knowledge of precautions, Decreased endurance, Decreased coordination, Decreased knowledge of use of DME, Decreased mobility, Decreased safety awareness, Impaired perceived functional ability, Impaired flexibility, Impaired sensation, Improper body mechanics, Postural dysfunction  Visit Diagnosis: Other abnormalities of gait and mobility  Muscle weakness (generalized)  Other lack of coordination     Problem List Patient Active Problem List   Diagnosis Date Noted  . Collagen vascular disease ([place]) 05/21/2017  . Gangrene of finger of right hand ([place]) 05/21/2017    Janna Arch, PT, DPT   06/18/2018, 1:50 PM  [place] MAIN [facility] SERVICES [address] [place], [place], [address] Phone: [phone]   Fax:  [phone]  Name: Ariel Alvarez MRN: [id number] Date of Birth: 1946-07-16

## 2018-06-24 ENCOUNTER — Ambulatory Visit: Payer: Medicare Other

## 2018-06-24 DIAGNOSIS — M6281 Muscle weakness (generalized): Secondary | ICD-10-CM

## 2018-06-24 DIAGNOSIS — R278 Other lack of coordination: Secondary | ICD-10-CM

## 2018-06-24 DIAGNOSIS — R2689 Other abnormalities of gait and mobility: Secondary | ICD-10-CM | POA: Diagnosis not present

## 2018-06-24 NOTE — Therapy (Signed)
[place] MAIN [facility] SERVICES [address] [place], [place], [address] Phone: [phone]   Fax:  [phone]  Physical Therapy Treatment  Patient Details  Name: Ariel Alvarez MRN: [id number] Date of Birth: Sep 10, 1946 Referring Provider: Dr. Paula Compton   Encounter Date: 06/24/2018  PT End of Session - 06/24/18 1309    Visit Number  24    Number of Visits  29    Date for PT Re-Evaluation  07/08/18    Authorization Type  6/10) PN start 05/26/18    PT Start Time  1300    PT Stop Time  1345    PT Time Calculation (min)  45 min    Equipment Utilized During Treatment  Gait belt    Activity Tolerance  Patient limited by fatigue;Patient tolerated treatment well    Behavior During Therapy  [facility] for tasks assessed/performed       Past Medical History:  Diagnosis Date  . Anxiety   . Collagen vascular disease ([place])   . Depression   . Stroke [facility])     Past Surgical History:  Procedure Laterality Date  . CHOLECYSTECTOMY      There were no vitals filed for this visit.  Subjective Assessment - 06/24/18 1307    Subjective  Patient went to [facility] over the weekend due to a tragedy in the family. She  did not walk or do HEP while in [place].     Patient is accompained by:  Family member    Pertinent History  Ariel Alvarez is a 72 y.o. 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 Feels like her walking and stability is decreased. Home Health PT stopped about 3 months ago.  Walked last year prior to stroke 01/2017 which then affected bilateral legs. Was in nursing home and fell out of bed and fractured L shoulder. Uses a walker and a lift chair to take a few steps. Stands at home with husband.     Limitations  Walking    How long can you sit comfortably?  n/a    How long can you stand comfortably?  5-6 minutes    How long can you walk comfortably?  4 steps    Patient Stated Goals   Patient wants to become more independent, walk and stand better.     Currently in Pain?  No/denies        TREATMENT   SPT from WC to Nustep and back requiring Mod A for transfer and max verbal cueing. Requires assistance to sit into correct place often sits at angle requiring Mod A-Max A to redirect into chair.     Nustep Lvl 1 3 minutes RPM >60 LE only for cardiovascular and strengthening. max verbal cueing for L adduction due to tendency to forget and let knee fall out   Heel toe raises 15x seated    Ambulate 20 ft and 8 ft with one seated rest breaks with hemiwalker and CGA. Cues for widening BOS and placement of LE"s for stable base. Patient requires WC follow and close stabilization   Seated hamstring stretch ; bilateral LE 2x60 seconds each leg     Seated marches 15x each leg    Seated adduction: PT hold foot to secure and provide visual and tactile cueing for adduction only x 20 attempts. Needs mirror for visual cueing   Standing in // bars:    Marching 10x each leg; Mod A for  weight shift onto L foot; requires PT foot between feet to promote wider BOS  .   Step back (extensions) 10x each leg. ; difficulty maintaining BOS; frequently stepping onto foot   Seated posture  In mirror : requires frequent Mod A for placement of LE's for stability.   Standing: static balance with single UE support weight shift onto LUE. ; use of full length mirror for midline positioning. Mod A for correction    Patient requires max verbal cueing for alignment of feet for proper COM and midline. Patient's natural midline has shifted to the R requiring verbal and tactile cueing for correction.    Pt. response to medical necessity:  Patient will continue to benefit from skilled physical therapy to improve gait mechanics, strength, and independence                        PT Education - 06/24/18 1308    Education provided  Yes    Education Details  exercise technique      [name]  Patient    Methods  Explanation;Demonstration;Verbal cues    Comprehension  Verbalized understanding;Returned demonstration       PT Short Term Goals - 06/10/18 1425      PT SHORT TERM GOAL #1   Title  Patient will perform one sit to stand independently to increase independence with transfers    Baseline  4/30: Min A 6/17: able to perform     Time  2    Period  Weeks    Status  Achieved      PT SHORT TERM GOAL #2   Title  Patient will be independent in home exercise program to improve strength/mobility for better functional independence with ADLs.    Baseline  4/30: exercises given 6/17: doing HEP 3x/week     Time  2    Period  Weeks    Status  Partially Met      PT SHORT TERM GOAL #3   Title  Patient will ambulate 10 ft with hemiwalker and CGA for improved home mobility    Baseline  ambulate 5 ft.  6/17: 28 ft     Time  2    Period  Weeks    Status  Achieved      PT SHORT TERM GOAL #4   Title  Patient will increase BLE gross strength to 4-/5 as to improve functional strength for independent gait, increased standing tolerance and increased ADL ability.    Baseline  4/30: 3/5 6/17: 4-/10     Time  2    Period  Weeks    Status  Achieved        PT Long Term Goals - 06/10/18 1358      PT LONG TERM GOAL #1   Title  Patient will perform 5 STS < 20 seconds independently with hemi walker to demonstrate improved home mobility.     Baseline  4/30: require min A for one 6/17: 5x STS: 65 seconds with min A for stability at top position last 2 sit to stands. 7/29: 41 seconds with hemiwalker 8/13: 82 seconds     Time  4    Period  Weeks    Status  Partially Met    Target Date  07/08/18      PT LONG TERM GOAL #2   Title  Patient will increase ABC scale score >50% to demonstrate better functional mobility and better confidence with ADLs.  Baseline  4/30: 7.5% 6/17: 8%; 7/29: 30% 8/13: 15.6%    Time  4    Period  Weeks    Status  Partially Met    Target  Date  07/08/18      PT LONG TERM GOAL #3   Title  Patient will ambulate 60 ft with hemi walker and CGA to improve home mobility and independence with gait.     Baseline  4/30: ambulate 5 ft; 6/17P 28 ft with CGA and hemiwalker 7/29: 17 ft with CGA and hemiwalker, fatigue due to previous bathroom mobility. ; 8/13: 21 ft with hemiwalker    Time  4    Period  Weeks    Status  Partially Met    Target Date  07/08/18      PT LONG TERM GOAL #4   Title  Patient will increase BLE gross strength to 4+/5 as to improve functional strength for independent gait, increased standing tolerance and increased ADL ability    Baseline  4/30: 3/5  6/17 : 4-/5 8/13: 4-/5     Time  4    Period  Weeks    Status  Partially Met    Target Date  07/08/18      PT LONG TERM GOAL #5   Title  Patient will increase lower extremity functional scale to >30/80 to demonstrate improved functional mobility and increased tolerance with ADLs.     Baseline  4/30: 9/80 6/17: 28/80 7/29: 17/80 8/13: 20/80     Time  4    Period  Weeks    Status  Partially Met    Target Date  07/08/18            Plan - 06/24/18 1352    Clinical Impression Statement  Patient has limited weight shift onto RLE with noted inversion of foot with narrow BOS. She requires Mod A for positioning with use of mirror. Written instructions given for HEP. Patient will continue to benefit from skilled physical therapy to improve gait mechanics, strength, and independence    Rehab Potential  Fair    Clinical Impairments Affecting Rehab Potential  (+) good family support (-) year since stroke    PT Frequency  2x / week    PT Duration  4 weeks    PT Treatment/Interventions  ADLs/Self Care Home Management;Cryotherapy;Electrical Stimulation;Moist Heat;Traction;Ultrasound;Therapeutic exercise;Therapeutic activities;Functional mobility training;Stair training;Gait training;DME Instruction;Balance training;Neuromuscular re-education;Patient/family  education;Orthotic Fit/Training;Manual techniques;Passive range of motion;Energy conservation;Visual/perceptual remediation/compensation    PT Next Visit Plan  transfer, sts, LE strength    PT Home Exercise Plan  see sheet    Consulted and Agree with Plan of Care  Patient;Family member/caregiver    Family Member Consulted  spouse       Patient will benefit from skilled therapeutic intervention in order to improve the following deficits and impairments:  Decreased strength, Impaired UE functional use, Impaired tone, Difficulty walking, Abnormal gait, Decreased activity tolerance, Decreased balance, Decreased knowledge of precautions, Decreased endurance, Decreased coordination, Decreased knowledge of use of DME, Decreased mobility, Decreased safety awareness, Impaired perceived functional ability, Impaired flexibility, Impaired sensation, Improper body mechanics, Postural dysfunction  Visit Diagnosis: Other abnormalities of gait and mobility  Muscle weakness (generalized)  Other lack of coordination     Problem List Patient Active Problem List   Diagnosis Date Noted  . Collagen vascular disease ([place]) 05/21/2017  . 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

## 2018-06-26 ENCOUNTER — Ambulatory Visit: Payer: Medicare Other

## 2018-06-26 DIAGNOSIS — R2689 Other abnormalities of gait and mobility: Secondary | ICD-10-CM | POA: Diagnosis not present

## 2018-06-26 DIAGNOSIS — M6281 Muscle weakness (generalized): Secondary | ICD-10-CM

## 2018-06-26 DIAGNOSIS — R278 Other lack of coordination: Secondary | ICD-10-CM

## 2018-06-26 NOTE — Therapy (Signed)
[place] MAIN [facility] SERVICES [address] [place], [place], [address] Phone: [phone]   Fax:  [phone]  Physical Therapy Treatment  Patient Details  Name: Ariel Alvarez MRN: [id number] Date of Birth: August 14, 1946 Referring Provider: Dr. Paula Compton   Encounter Date: 06/26/2018  PT End of Session - 06/26/18 1443    Visit Number  25    Number of Visits  29    Date for PT Re-Evaluation  07/08/18    Authorization Type  7/10) PN start 05/26/18    PT Start Time  1347    PT Stop Time  1433    PT Time Calculation (min)  46 min    Equipment Utilized During Treatment  Gait belt    Activity Tolerance  Patient limited by fatigue;Patient tolerated treatment well    Behavior During Therapy  [facility] for tasks assessed/performed       Past Medical History:  Diagnosis Date  . Anxiety   . Collagen vascular disease ([place])   . Depression   . Stroke [facility])     Past Surgical History:  Procedure Laterality Date  . CHOLECYSTECTOMY      There were no vitals filed for this visit.  Subjective Assessment - 06/26/18 1358    Subjective  Patient reports she was not able to do her walking HEP due to knee pain. Patient challenged with alignment HEP to work on neutral placement.     Patient is accompained by:  Family member    Pertinent History  Ariel Alvarez is a 72 y.o. 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 Feels like her walking and stability is decreased. Home Health PT stopped about 3 months ago.  Walked last year prior to stroke 01/2017 which then affected bilateral legs. Was in nursing home and fell out of bed and fractured L shoulder. Uses a walker and a lift chair to take a few steps. Stands at home with husband.     Limitations  Walking    How long can you sit comfortably?  n/a    How long can you stand comfortably?  5-6 minutes    How long can you walk comfortably?  4 steps    Patient Stated Goals  Patient wants to become more independent, walk and stand better.     Currently in Pain?  Yes    Pain Score  4     Pain Location  Knee    Pain Orientation  Left;Right    Pain Descriptors / Indicators  Aching    Pain Type  Acute pain    Pain Onset  In the past 7 days    Pain Frequency  Intermittent      SPT : x 6; Mod A with hemiwalker and max verbal cueing for left foot placement. Patient frequently not compliant with commands and starts sitting prior to reaching surface requiring PT to perform Max A glide into chair/surface.   IT band L knee rollout  4 minutes: improved neutral knee/ankle alignment afterwards  Seated on plinth table : AROM ER LLE 15x 2 sets; cues for alignment utilization of mirror ; Mod A to knee to align/stabilize knees   bathroom mobility x 15 minutes: dependent clothing/underwear and cleaning self.  . Mod A transfer from [facility] to toilet and back.   Seated marching cueing for keeping ankle under knee. X 20; max verbal cueing required with occasional Min A for bony alignment  Sitting without back support 3x 1 minute trials, patient difficulty with maintaining orientation to task.    Patient challenged with task orientation today requiring max verbal cueing.                    PT Education - 06/26/18 1443    Education provided  Yes    Education Details  need for compliance with HEP     [name]  Patient    Methods  Explanation;Demonstration;Verbal cues    Comprehension  Verbalized understanding;Returned demonstration       PT Short Term Goals - 06/10/18 1425      PT SHORT TERM GOAL #1   Title  Patient will perform one sit to stand independently to increase independence with transfers    Baseline  4/30: Min A 6/17: able to perform     Time  2    Period  Weeks    Status  Achieved      PT SHORT TERM GOAL #2   Title  Patient will be independent in home exercise program to improve strength/mobility for better  functional independence with ADLs.    Baseline  4/30: exercises given 6/17: doing HEP 3x/week     Time  2    Period  Weeks    Status  Partially Met      PT SHORT TERM GOAL #3   Title  Patient will ambulate 10 ft with hemiwalker and CGA for improved home mobility    Baseline  ambulate 5 ft.  6/17: 28 ft     Time  2    Period  Weeks    Status  Achieved      PT SHORT TERM GOAL #4   Title  Patient will increase BLE gross strength to 4-/5 as to improve functional strength for independent gait, increased standing tolerance and increased ADL ability.    Baseline  4/30: 3/5 6/17: 4-/10     Time  2    Period  Weeks    Status  Achieved        PT Long Term Goals - 06/10/18 1358      PT LONG TERM GOAL #1   Title  Patient will perform 5 STS < 20 seconds independently with hemi walker to demonstrate improved home mobility.     Baseline  4/30: require min A for one 6/17: 5x STS: 65 seconds with min A for stability at top position last 2 sit to stands. 7/29: 41 seconds with hemiwalker 8/13: 82 seconds     Time  4    Period  Weeks    Status  Partially Met    Target Date  07/08/18      PT LONG TERM GOAL #2   Title  Patient will increase ABC scale score >50% to demonstrate better functional mobility and better confidence with ADLs.     Baseline  4/30: 7.5% 6/17: 8%; 7/29: 30% 8/13: 15.6%    Time  4    Period  Weeks    Status  Partially Met    Target Date  07/08/18      PT LONG TERM GOAL #3   Title  Patient will ambulate 60 ft with hemi walker and CGA to improve home mobility and independence with gait.     Baseline  4/30: ambulate 5 ft; 6/17P 28 ft with CGA and hemiwalker 7/29: 17 ft with CGA and hemiwalker, fatigue due to previous bathroom mobility. ; 8/13: 21 ft with hemiwalker  Time  4    Period  Weeks    Status  Partially Met    Target Date  07/08/18      PT LONG TERM GOAL #4   Title  Patient will increase BLE gross strength to 4+/5 as to improve functional strength for  independent gait, increased standing tolerance and increased ADL ability    Baseline  4/30: 3/5  6/17 : 4-/5 8/13: 4-/5     Time  4    Period  Weeks    Status  Partially Met    Target Date  07/08/18      PT LONG TERM GOAL #5   Title  Patient will increase lower extremity functional scale to >30/80 to demonstrate improved functional mobility and increased tolerance with ADLs.     Baseline  4/30: 9/80 6/17: 28/80 7/29: 17/80 8/13: 20/80     Time  4    Period  Weeks    Status  Partially Met    Target Date  07/08/18            Plan - 06/26/18 1444    Clinical Impression Statement  Patient is limited in duration of sitting without back support. She frequently is not compliant with commands during sit to stand and starts sitting prior to reaching surface requiring PT to perform Max A glide into chair/surface. Patient had incidence of urinary urge requiring toileting mobility upon which she is max/dependent.  Patient not compliant with HEP and request for patient to ambulate between sessions is not being done. Patient made re-aware that she must have progress when retesting goals in two sessions to continue outpatient therapy due to recent plateau.     Rehab Potential  Fair    Clinical Impairments Affecting Rehab Potential  (+) good family support (-) year since stroke    PT Frequency  2x / week    PT Duration  4 weeks    PT Treatment/Interventions  ADLs/Self Care Home Management;Cryotherapy;Electrical Stimulation;Moist Heat;Traction;Ultrasound;Therapeutic exercise;Therapeutic activities;Functional mobility training;Stair training;Gait training;DME Instruction;Balance training;Neuromuscular re-education;Patient/family education;Orthotic Fit/Training;Manual techniques;Passive range of motion;Energy conservation;Visual/perceptual remediation/compensation    PT Next Visit Plan  transfer, sts, LE strength    PT Home Exercise Plan  see sheet    Consulted and Agree with Plan of Care  Patient;Family  member/caregiver    Family Member Consulted  spouse       Patient will benefit from skilled therapeutic intervention in order to improve the following deficits and impairments:  Decreased strength, Impaired UE functional use, Impaired tone, Difficulty walking, Abnormal gait, Decreased activity tolerance, Decreased balance, Decreased knowledge of precautions, Decreased endurance, Decreased coordination, Decreased knowledge of use of DME, Decreased mobility, Decreased safety awareness, Impaired perceived functional ability, Impaired flexibility, Impaired sensation, Improper body mechanics, Postural dysfunction  Visit Diagnosis: Other abnormalities of gait and mobility  Muscle weakness (generalized)  Other lack of coordination     Problem List Patient Active Problem List   Diagnosis Date Noted  . Collagen vascular disease ([place]) 05/21/2017  . Gangrene of finger of right hand ([place]) 05/21/2017   Janna Arch, PT, DPT   06/26/2018, 2:46 PM  [place] MAIN [facility] SERVICES [address] [place], [place], [address] Phone: [phone]   Fax:  [phone]  Name: DEYANA WNUK MRN: [id number] Date of Birth: [birth year]

## 2018-07-02 ENCOUNTER — Ambulatory Visit: Payer: Medicare Other

## 2018-07-02 ENCOUNTER — Ambulatory Visit: Payer: Medicare Other | Attending: Internal Medicine

## 2018-07-02 DIAGNOSIS — R278 Other lack of coordination: Secondary | ICD-10-CM | POA: Diagnosis present

## 2018-07-02 DIAGNOSIS — R2689 Other abnormalities of gait and mobility: Secondary | ICD-10-CM | POA: Insufficient documentation

## 2018-07-02 DIAGNOSIS — M6281 Muscle weakness (generalized): Secondary | ICD-10-CM | POA: Insufficient documentation

## 2018-07-02 NOTE — Therapy (Signed)
[place] MAIN [facility] SERVICES [address] [place], [place], [address] Phone: [phone]   Fax:  [phone]  Physical Therapy Treatment  Patient Details  Name: Ariel Alvarez MRN: [id number] Date of Birth: February 08, 1946 Referring Provider: Dr. Paula Compton   Encounter Date: 07/02/2018  PT End of Session - 07/02/18 1408    Visit Number  26    Number of Visits  29    Date for PT Re-Evaluation  07/08/18    Authorization Type  8/10) PN start 05/26/18    PT Start Time  1300    PT Stop Time  1338    PT Time Calculation (min)  38 min    Equipment Utilized During Treatment  Gait belt    Activity Tolerance  Patient limited by fatigue;Patient tolerated treatment well    Behavior During Therapy  [facility] for tasks assessed/performed;Anxious       Past Medical History:  Diagnosis Date  . Anxiety   . Collagen vascular disease ([place])   . Depression   . Stroke [facility])     Past Surgical History:  Procedure Laterality Date  . CHOLECYSTECTOMY      There were no vitals filed for this visit.  Subjective Assessment - 07/02/18 1406    Subjective  Patient and husband present for therapy. Won't be able to come to Monday for testing of goals so will have to do them today. Has not been able to do HEP on neutral LE placement.     Patient is accompained by:  Family member    Pertinent History  Ariel Alvarez is a 72 y.o. 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 Feels like her walking and stability is decreased. Home Health PT stopped about 3 months ago.  Walked last year prior to stroke 01/2017 which then affected bilateral legs. Was in nursing home and fell out of bed and fractured L shoulder. Uses a walker and a lift chair to take a few steps. Stands at home with husband.     Limitations  Walking    How long can you sit comfortably?  n/a    How long can you stand comfortably?  5-6 minutes    How long  can you walk comfortably?  4 steps    Patient Stated Goals  Patient wants to become more independent, walk and stand better.     Currently in Pain?  No/denies      5x STS: 66 seconds ABC: 9.4 % 60 ft with hemiwalker: 15 ft and 20 ft  BLE strength LEFS: 24/80    not [employer] for home health     Husband educated on HEP compliance and continuation:   neutral foot alignment of LLE in seated position with mirror  Sit to stand with prolonged standing during commercial breaks while watching tv  Walking 1x/day with WC follow   Patient also would benefit from home health due to incontinence issues.                  PT Education - 07/02/18 1407    Education provided  Yes    Education Details  d/c due to non progress.     [name]  Patient    Methods  Explanation;Demonstration;Verbal cues    Comprehension  Verbalized understanding;Returned demonstration       PT Short Term Goals - 06/10/18 1425      PT SHORT TERM GOAL #1  Title  Patient will perform one sit to stand independently to increase independence with transfers    Baseline  4/30: Min A 6/17: able to perform     Time  2    Period  Weeks    Status  Achieved      PT SHORT TERM GOAL #2   Title  Patient will be independent in home exercise program to improve strength/mobility for better functional independence with ADLs.    Baseline  4/30: exercises given 6/17: doing HEP 3x/week     Time  2    Period  Weeks    Status  Partially Met      PT SHORT TERM GOAL #3   Title  Patient will ambulate 10 ft with hemiwalker and CGA for improved home mobility    Baseline  ambulate 5 ft.  6/17: 28 ft     Time  2    Period  Weeks    Status  Achieved      PT SHORT TERM GOAL #4   Title  Patient will increase BLE gross strength to 4-/5 as to improve functional strength for independent gait, increased standing tolerance and increased ADL ability.    Baseline  4/30: 3/5 6/17: 4-/10     Time  2    Period   Weeks    Status  Achieved        PT Long Term Goals - 07/02/18 1326      PT LONG TERM GOAL #1   Title  Patient will perform 5 STS < 20 seconds independently with hemi walker to demonstrate improved home mobility.     Baseline  4/30: require min A for one 6/17: 5x STS: 65 seconds with min A for stability at top position last 2 sit to stands. 7/29: 41 seconds with hemiwalker 8/13: 82 seconds 9/4:  66 seconds    Time  4    Period  Weeks    Status  Not Met      PT LONG TERM GOAL #2   Title  Patient will increase ABC scale score >50% to demonstrate better functional mobility and better confidence with ADLs.     Baseline  4/30: 7.5% 6/17: 8%; 7/29: 30% 8/13: 15.6% 9/4: 9%    Time  4    Period  Weeks    Status  Not Met      PT LONG TERM GOAL #3   Title  Patient will ambulate 60 ft with hemi walker and CGA to improve home mobility and independence with gait.     Baseline  4/30: ambulate 5 ft; 6/17P 28 ft with CGA and hemiwalker 7/29: 17 ft with CGA and hemiwalker, fatigue due to previous bathroom mobility. ; 8/13: 21 ft with hemiwalker; 9/4: 20 ft     Time  4    Period  Weeks    Status  Not Met      PT LONG TERM GOAL #4   Title  Patient will increase BLE gross strength to 4+/5 as to improve functional strength for independent gait, increased standing tolerance and increased ADL ability    Baseline  4/30: 3/5  6/17 : 4-/5 8/13: 4-/5     Time  4    Period  Weeks    Status  Not Met      PT LONG TERM GOAL #5   Title  Patient will increase lower extremity functional scale to >30/80 to demonstrate improved functional mobility and increased tolerance with ADLs.  Baseline  4/30: 9/80 6/17: 28/80 7/29: 17/80 8/13: 20/80 9/4:  24/80     Time  4    Period  Weeks    Status  Not Met            Plan - 07/02/18 1408    Clinical Impression Statement  Patient has not met/made clinically significant improvements towards goals at this time. Patient and husband educated that due to limited  progression at end of trial period patient must be discharged. Patient would benefit from home health physical therapy due to limited mobility in home, limited safety with transfers and poor body mechanics/posture.  I will be happy to see patient again in the future as needed.     Rehab Potential  Fair    Clinical Impairments Affecting Rehab Potential  (+) good family support (-) year since stroke    PT Frequency  2x / week    PT Duration  4 weeks    PT Treatment/Interventions  ADLs/Self Care Home Management;Cryotherapy;Electrical Stimulation;Moist Heat;Traction;Ultrasound;Therapeutic exercise;Therapeutic activities;Functional mobility training;Stair training;Gait training;DME Instruction;Balance training;Neuromuscular re-education;Patient/family education;Orthotic Fit/Training;Manual techniques;Passive range of motion;Energy conservation;Visual/perceptual remediation/compensation    PT Next Visit Plan  --    PT Home Exercise Plan  see sheet    Consulted and Agree with Plan of Care  Patient;Family member/caregiver    Family Member Consulted  spouse       Patient will benefit from skilled therapeutic intervention in order to improve the following deficits and impairments:  Decreased strength, Impaired UE functional use, Impaired tone, Difficulty walking, Abnormal gait, Decreased activity tolerance, Decreased balance, Decreased knowledge of precautions, Decreased endurance, Decreased coordination, Decreased knowledge of use of DME, Decreased mobility, Decreased safety awareness, Impaired perceived functional ability, Impaired flexibility, Impaired sensation, Improper body mechanics, Postural dysfunction  Visit Diagnosis: Other abnormalities of gait and mobility  Other lack of coordination  Muscle weakness (generalized)     Problem List Patient Active Problem List   Diagnosis Date Noted  . Collagen vascular disease ([place]) 05/21/2017  . Gangrene of finger of right hand ([place]) 05/21/2017    Janna Arch, PT, DPT   07/02/2018, 2:10 PM  [place] MAIN [facility] SERVICES [address] [place], [place], [address] Phone: [phone]   Fax:  [phone]  Name: Ariel Alvarez MRN: [id number] Date of Birth: February 05, 1946

## 2018-07-07 ENCOUNTER — Ambulatory Visit: Payer: Medicare Other

## 2018-07-09 ENCOUNTER — Ambulatory Visit: Payer: Medicare Other

## 2018-07-14 ENCOUNTER — Ambulatory Visit: Payer: Medicare Other

## 2018-07-16 ENCOUNTER — Ambulatory Visit: Payer: Medicare Other

## 2018-07-21 ENCOUNTER — Ambulatory Visit: Payer: Medicare Other

## 2018-07-23 ENCOUNTER — Ambulatory Visit: Payer: Medicare Other

## 2018-08-08 DIAGNOSIS — M810 Age-related osteoporosis without current pathological fracture: Secondary | ICD-10-CM | POA: Insufficient documentation

## 2019-02-02 ENCOUNTER — Ambulatory Visit: Payer: Medicare Other | Admitting: Physical Therapy

## 2019-02-04 ENCOUNTER — Ambulatory Visit: Payer: Medicare Other | Admitting: Physical Therapy

## 2019-02-09 ENCOUNTER — Ambulatory Visit: Payer: Medicare Other | Admitting: Physical Therapy

## 2019-02-11 ENCOUNTER — Ambulatory Visit: Payer: Medicare Other | Admitting: Physical Therapy

## 2019-02-16 ENCOUNTER — Ambulatory Visit: Payer: Medicare Other | Admitting: Physical Therapy

## 2019-02-18 ENCOUNTER — Ambulatory Visit: Payer: Medicare Other | Admitting: Physical Therapy

## 2019-02-23 ENCOUNTER — Ambulatory Visit: Payer: Medicare Other | Admitting: Physical Therapy

## 2019-02-25 ENCOUNTER — Ambulatory Visit: Payer: Medicare Other | Admitting: Physical Therapy

## 2019-03-02 ENCOUNTER — Ambulatory Visit: Payer: Medicare Other | Admitting: Physical Therapy

## 2019-03-04 ENCOUNTER — Ambulatory Visit: Payer: Medicare Other | Admitting: Physical Therapy

## 2019-03-09 ENCOUNTER — Ambulatory Visit: Payer: Medicare Other | Admitting: Physical Therapy

## 2019-03-11 ENCOUNTER — Ambulatory Visit: Payer: Medicare Other | Admitting: Physical Therapy

## 2019-03-16 ENCOUNTER — Ambulatory Visit: Payer: Medicare Other | Admitting: Physical Therapy

## 2019-03-18 ENCOUNTER — Ambulatory Visit: Payer: Medicare Other | Admitting: Physical Therapy

## 2019-03-25 ENCOUNTER — Ambulatory Visit: Payer: Medicare Other | Admitting: Physical Therapy

## 2019-04-14 ENCOUNTER — Ambulatory Visit: Payer: Medicare Other | Attending: Internal Medicine

## 2019-04-14 ENCOUNTER — Other Ambulatory Visit: Payer: Self-pay

## 2019-04-14 DIAGNOSIS — R2689 Other abnormalities of gait and mobility: Secondary | ICD-10-CM | POA: Diagnosis present

## 2019-04-14 DIAGNOSIS — R278 Other lack of coordination: Secondary | ICD-10-CM | POA: Diagnosis present

## 2019-04-14 DIAGNOSIS — M6281 Muscle weakness (generalized): Secondary | ICD-10-CM | POA: Diagnosis present

## 2019-04-14 NOTE — Therapy (Signed)
[facility] MAIN [facility] SERVICES [address] [place], [place], [address] Phone: [phone]   Fax:  [phone]  Physical Therapy Evaluation  Patient Details  Name: Ariel Alvarez MRN: [id number] Date of Birth: 23-Mar-1946 Referring Provider (PT): Dorien Chihuahua   Encounter Date: 04/14/2019  PT End of Session - 04/15/19 0826    Visit Number  1    Number of Visits  16    Date for PT Re-Evaluation  06/10/19    PT Start Time  1414    PT Stop Time  1502    PT Time Calculation (min)  48 min    Equipment Utilized During Treatment  Gait belt    Activity Tolerance  Patient limited by fatigue;Other (comment)   fear of movement   Behavior During Therapy  WFL for tasks assessed/performed;Anxious   tangential nature of conversation, require max encouragement/cueing      Past Medical History:  Diagnosis Date  . Anxiety   . Collagen vascular disease ([facility])   . Depression   . Stroke [facility])     Past Surgical History:  Procedure Laterality Date  . CHOLECYSTECTOMY      There were no vitals filed for this visit.   Subjective Assessment - 04/14/19 1421    Subjective  Patient is a pleasant 73 year old female who presents for evaluation for limited balance and mobility.    Patient is accompained by:  Family member    Pertinent History  Patient presents for instability s/p CVA in 01/2017. Patient was recently diagnosed with osteoporosis in bilateral legs and feet. Is potentially getting surgery in 2 weeks on both feet. After discharge from outpatient physical therapy had one session with home health PT but no other therapy. Reports no falls. Reports walking from living room to kitchen and back to walkway however her LLE gives out frequently . Per husband and patient she was going to an adult day center prior to COVID. Patient has a flat/level entry. Trying to get a power chair at this time from [employer]. Has a manual chair at this time,  walker, hemi walker, quad cane. Doing bird baths but has a walk in tub she can't get in, does have a shower chair. Patient and husband aware that patient will need caregiver with her due to toileting needs.    Limitations  Sitting;Lifting;Standing;Walking;House hold activities    How long can you sit comfortably?  recliner 5 hours, 30 minute sin wheelchair    How long can you stand comfortably?  4 minutes with UE support    How long can you walk comfortably?  15 ft    Patient Stated Goals  to walk better    Currently in Pain?  No/denies        [facility] PT Assessment - 04/15/19 0001      Assessment   Medical Diagnosis  CVA    Referring Provider (PT)  Dorien Chihuahua    Onset Date/Surgical Date  --   2014   Hand Dominance  Right    Prior Therapy  yes-here      Precautions   Precautions  Fall      Restrictions   Weight Bearing Restrictions  No      Balance Screen   Has the patient fallen in the past 6 months  No   patient reports no, very fearful of movement   Has the patient had a decrease in activity level because of a fear of falling?  Yes    Is the patient reluctant to leave their home because of a fear of falling?   Yes      [place] residence    Living Arrangements  Spouse/significant other    Available Help at Discharge  Family    Type of [place] Access  Level entry    [place] to live on main level with bedroom/bathroom    Home Equipment  Wheelchair - manual;Toilet riser;Shower seat;Other (comment)   hemiwalker     Prior Function   Level of Independence  Independent with household mobility with device;Needs assistance with ADLs;Needs assistance with homemaking    Leisure  goes to adult day center      Cognition   Overall Cognitive Status  History of cognitive impairments - at baseline           Patient presents for instability s/p CVA in 01/2017. Patient was recently diagnosed with osteoporosis in  bilateral legs and feet. Is potentially getting surgery in 2 weeks on both feet. After discharge from outpatient physical therapy had one session with home health PT but no other therapy. Reports no falls. Reports walking from living room to kitchen and back to walkway however her LLE gives out frequently . Per husband and patient she was going to an adult day center prior to [place]. Patient has a flat/level entry. Trying to get a power chair at this time from [employer]. Has a manual chair at this time, walker, hemi walker, quad cane. Doing bird baths but has a walk in tub she can't get in, does have a shower chair. Patient and husband aware that patient will need caregiver with her due to toileting needs.    PAIN: When walk legs feel like they are on fire. Left hip hurts.  Worst VAS 5/10 in L hip:   POSTURE:  Seated: external rotation of L hip with right lateral lean Standing: excessive weight shift to R with hiked L shoulder and curvature of spine   PROM/AROM: Limited hamstring length bilaterally, limited hip flexor bilaterally  STRENGTH:  Graded on a 0-5 scale Muscle Group Left Right  Hip Flex 3/5 3+/5  Hip Abd 3-/5 3/5  Hip Add 2-/5 3/5  Hip Ext 2-/5 2+/5  Hip IR/ER excessive ER   Knee Flex 3+/5 4-/5  Knee Ext 3+/5 4-/5  Ankle DF 3/5 3/5  Ankle PF 3/5 3/5   SENSATION: Decreased sensation LLE Limited coordination of BLE's    FUNCTIONAL MOBILITY: STS: require Min/Mod A from wheelchair, Min/CGA from raised plinth table  SPT: Max cueing with Mod A from wheelchair to plinth table  BALANCE: Dynamic Sitting Balance  Normal Able to sit unsupported and weight shift across midline maximally   Good Able to sit unsupported and weight shift across midline moderately   Good-/Fair+ Able to sit unsupported and weight shift across midline minimally   Fair Minimal weight shifting ipsilateral/front, difficulty crossing midline   Fair- Reach to ipsilateral side and unable to  weight shift x  Poor + Able to sit unsupported with min A and reach to ipsilateral side, unable to weight shift   Poor Able to sit unsupported with mod A and reach ipsilateral/front-can't cross midline     Standing Dynamic Balance  Normal Stand independently unsupported, able to weight shift and cross midline maximally   Good Stand independently unsupported, able to weight shift and cross midline moderately  Good-/Fair+ Stand independently unsupported, able to weight shift across midline minimally   Fair Stand independently unsupported, weight shift, and reach ipsilaterally, loss of balance when crossing midline   Poor+ Able to stand with Min A and reach ipsilaterally, unable to weight shift x  Poor Able to stand with Mod A and minimally reach ipsilaterally, unable to cross midline.     Static Sitting Balance  Normal Able to maintain balance against maximal resistance   Good Able to maintain balance against moderate resistance   Good-/Fair+ Accepts minimal resistance   Fair Able to sit unsupported without balance loss and without UE support   Poor+ Able to maintain with Minimal assistance from individual or chair x  Poor Unable to maintain balance-requires mod/max support from individual or chair     Static Standing Balance  Normal Able to maintain standing balance against maximal resistance   Good Able to maintain standing balance against moderate resistance   Good-/Fair+ Able to maintain standing balance against minimal resistance   Fair Able to stand unsupported without UE support and without LOB for 1-2 min   Fair- Requires Min A and UE support to maintain standing without loss of balance x  Poor+ Requires mod A and UE support to maintain standing without loss of balance   Poor Requires max A and UE support to maintain standing balance without loss      GAIT: Step to pattern with hemiwalker with LLE leading, Challenge of lifting bilateral LE/floor clearance resulting in  shuffle pattern.  Able to ambulate 6 ft with max cueing and w/c follow, then seated break then ambulate another 8 ft.   Treatment: Education and performance of neutral hip alignment for COM obtainment with neutral foot position.   Education and sequencing of safe SPT transfers. Mod A with patient demonstrating  unsafe technique requiring max cueing and tactile assistance for education to both patient and patient's spouse for decreasing fall risk.       Objective measurements completed on examination: See above findings.              PT Education - 04/15/19 0822    Education Details  goals, evaluation, posture correction/COM, safe transfers    [name]  Patient;Spouse    Methods  Explanation;Demonstration;Tactile cues;Verbal cues    Comprehension  Verbalized understanding;Returned demonstration;Verbal cues required;Tactile cues required;Need further instruction       PT Short Term Goals - 04/15/19 0852      PT SHORT TERM GOAL #1   Title  Patient will perform one sit to stand independently from wheelchair to increase independence with transfers    Baseline  6/17: min/mod A    Time  2    Period  Weeks    Status  New    Target Date  04/29/19      PT SHORT TERM GOAL #2   Title  Patient will be independent in home exercise program to improve strength/mobility for better functional independence with ADLs.    Baseline  HEP to be given next session    Time  2    Status  New    Target Date  04/29/19        PT Long Term Goals - 04/15/19 [id number]      PT LONG TERM GOAL #1   Title  Patient will perform 5 STS < 40 seconds independently with hemi walker to demonstrate improved home mobility.    Baseline  6/17: unable to perform one time without Min/Mod A  Time  8    Period  Weeks    Status  New    Target Date  06/10/19      PT LONG TERM GOAL #2   Title  Patient will stand without UE support for 3-5 minutes for ADL performance without LOB to increase  independence with dressing and toileting.    Baseline  6/17: unable to stand without UE support    Time  8    Period  Weeks    Status  New    Target Date  06/10/19      PT LONG TERM GOAL #3   Title  Patient will ambulate 60 ft with hemi walker and CGA to improve home mobility and independence with gait.     Baseline  6/17: 10 ft    Time  9    Period  Weeks    Target Date  06/10/19      PT LONG TERM GOAL #4   Title  Patient will increase BLE gross strength to 4+/5 as to improve functional strength for independent gait, increased standing tolerance and increased ADL ability    Baseline  6/17: hips grossly 2+/5 knees 3+/5    Time  8    Period  Weeks    Status  New    Target Date  06/10/19             Plan - 04/15/19 0827    Clinical Impression Statement  Patient is a pleasant 73 year old female who presents for generalized weakness and limited mobility s/p CVA 01/2017. Patient has history of two strokes resulting in left paralysis of UE. Use of a hemiwalker in RUE for short ambulation and transfers is utilized. Patient challenged with coordination and transfers at this time and has noted loss of function and mobility since seen last year at this facility. Patient was not able to perform 10 MWT, 5x STS, or BERG test due to limited functional mobility. Patient will benefit from skilled physical therapy to increase independence with transfers, mobility, ambulation, and decrease falls risk.    Personal Factors and Comorbidities  Behavior Pattern;Comorbidity 3+;Fitness;Past/Current Experience;Social Background;Time since onset of injury/illness/exacerbation;Transportation    Comorbidities  anxiety, collagen vascular disease, depression, stroke, amputated digit    Examination-Activity Limitations  Bathing;Bed Mobility;Bend;Caring for Others;Carry;Continence;Dressing;Sleep;Sit;Reach Overhead;Locomotion Level;Lift;Hygiene/Grooming;Squat;Stairs;Stand;Toileting;Transfers     Examination-Participation Restrictions  Church;Cleaning;Community Activity;Driving;Interpersonal Relationship;Meal Prep;Medication Management;Laundry;Shop;Volunteer;Yard Work    Stability/Clinical Decision Making  Evolving/Moderate complexity    Clinical Decision Making  Moderate    Rehab Potential  Fair    PT Frequency  2x / week    PT Treatment/Interventions  ADLs/Self Care Home Management;Aquatic Therapy;Cryotherapy;Biofeedback;Electrical Stimulation;Iontophoresis 4mg /ml Dexamethasone;Moist Heat;Traction;Ultrasound;Functional mobility training;Stair training;Gait training;DME Instruction;Therapeutic activities;Therapeutic exercise;Balance training;Neuromuscular re-education;Orthotic Fit/Training;Patient/family education;Cognitive remediation;Wheelchair mobility training;Manual techniques;Taping;Splinting;Energy conservation    PT Next Visit Plan  give HEP, practice SPT, ambulation, COM    PT Home Exercise Plan  give next session    Recommended Other Services  OT    Consulted and Agree with Plan of Care  Family member/caregiver    Family Member Consulted  husband       Patient will benefit from skilled therapeutic intervention in order to improve the following deficits and impairments:  Abnormal gait, Decreased activity tolerance, Decreased coordination, Decreased balance, Decreased endurance, Decreased knowledge of precautions, Decreased knowledge of use of DME, Decreased safety awareness, Decreased range of motion, Decreased mobility, Decreased strength, Difficulty walking, Hypomobility, Increased fascial restricitons, Impaired flexibility, Impaired perceived functional ability, Impaired sensation, Impaired tone, Impaired  UE functional use, Postural dysfunction, Improper body mechanics, Pain, Obesity  Visit Diagnosis: 1. Other abnormalities of gait and mobility   2. Other lack of coordination   3. Muscle weakness (generalized)        Problem List Patient Active Problem List   Diagnosis  Date Noted  . Collagen vascular disease (H[facility]) 05/21/2017  . 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) 05/21/2017   Precious BardMarina Taralynn Quiett, PT, DPT   04/15/2019, 9:31 AM  [place] [facility] [address] [place], [place], [id number] Phone: [phone]   Fax:  [phone]  Name: Ariel Alvarez MRN: [id number] Date of Birth: [birth year]

## 2019-04-16 ENCOUNTER — Ambulatory Visit: Payer: Medicare Other | Admitting: Physical Therapy

## 2019-04-16 ENCOUNTER — Encounter: Payer: Self-pay | Admitting: Physical Therapy

## 2019-04-16 ENCOUNTER — Other Ambulatory Visit: Payer: Self-pay

## 2019-04-16 DIAGNOSIS — R2689 Other abnormalities of gait and mobility: Secondary | ICD-10-CM

## 2019-04-16 DIAGNOSIS — R278 Other lack of coordination: Secondary | ICD-10-CM

## 2019-04-16 DIAGNOSIS — M6281 Muscle weakness (generalized): Secondary | ICD-10-CM

## 2019-04-16 NOTE — Therapy (Signed)
[place] MAIN [facility] SERVICES [address] [place], [place], [address] Phone: [phone]   Fax:  [phone]  Physical Therapy Treatment  Patient Details  Name: Ariel Alvarez MRN: [id number] Date of Birth: 1946/01/05 Referring Provider (PT): Okey Regal   Encounter Date: 04/16/2019  PT End of Session - 04/16/19 1412    Visit Number  2    Number of Visits  16    Date for PT Re-Evaluation  06/10/19    PT Start Time  0204    PT Stop Time  0242    PT Time Calculation (min)  38 min    Equipment Utilized During Treatment  Gait belt    Activity Tolerance  Patient limited by fatigue;Other (comment)   fear of movement   Behavior During Therapy  WFL for tasks assessed/performed;Anxious   tangential nature of conversation, require max encouragement/cueing      Past Medical History:  Diagnosis Date  . Anxiety   . Collagen vascular disease ([place])   . Depression   . Stroke [facility])     Past Surgical History:  Procedure Laterality Date  . CHOLECYSTECTOMY      There were no vitals filed for this visit.  Subjective Assessment - 04/16/19 1411    Subjective  Patinet has no new complaints. Her husband says he is not able to attend each theray session due to him working. she is not having any pain.    Patient is accompained by:  Family member    Pertinent History  Patient presents for instability s/p CVA in 01/2017. Patient was recently diagnosed with osteoporosis in bilateral legs and feet. Is potentially getting surgery in 2 weeks on both feet. After discharge from outpatient physical therapy had one session with home health PT but no other therapy. Reports no falls. Reports walking from living room to kitchen and back to walkway however her LLE gives out frequently . Per husband and patient she was going to an adult day center prior to [place]. Patient has a flat/level entry. Trying to get a power chair at this time from [employer]. Has a manual chair at this time, walker, hemi walker, quad cane. Doing bird baths but has a walk in tub she can't get in, does have a shower chair. Patient and husband aware that patient will need caregiver with her due to toileting needs.    Limitations  Sitting;Lifting;Standing;Walking;House hold activities    How long can you sit comfortably?  recliner 5 hours, 30 minute sin wheelchair    How long can you stand comfortably?  4 minutes with UE support    How long can you walk comfortably?  15 ft    Patient Stated Goals  to walk better    Currently in Pain?  No/denies    Multiple Pain Sites  No         Treatment:  Sit to stand transfers from wc<> mat and min assist and vc for sequencing and safety x 6 reps  Sit to stand transfers wc <> standing with hemi walker x 5 reps  Gait training with hemi walker 30  Feet with CGA and wc following , patient has fatigue and pain in her left hip during walking    Patient needs VC for safety and correct hand placement during mobility training and posture correction.  PT Education - 04/16/19 1412    Education Details  HEP    [name]  Patient    Methods  Explanation    Comprehension  Verbalized understanding;Need further instruction       PT Short Term Goals - 04/15/19 0852      PT SHORT TERM GOAL #1   Title  Patient will perform one sit to stand independently from wheelchair to increase independence with transfers    Baseline  6/17: min/mod A    Time  2    Period  Weeks    Status  New    Target Date  04/29/19      PT SHORT TERM GOAL #2   Title  Patient will be independent in home exercise program to improve strength/mobility for better functional independence with ADLs.    Baseline  HEP to be given next session    Time  2    Status  New    Target Date  04/29/19        PT Long Term Goals - 04/15/19 1497      PT LONG TERM GOAL #1   Title  Patient will  perform 5 STS < 40 seconds independently with hemi walker to demonstrate improved home mobility.    Baseline  6/17: unable to perform one time without Min/Mod A    Time  8    Period  Weeks    Status  New    Target Date  06/10/19      PT LONG TERM GOAL #2   Title  Patient will stand without UE support for 3-5 minutes for ADL performance without LOB to increase independence with dressing and toileting.    Baseline  6/17: unable to stand without UE support    Time  8    Period  Weeks    Status  New    Target Date  06/10/19      PT LONG TERM GOAL #3   Title  Patient will ambulate 60 ft with hemi walker and CGA to improve home mobility and independence with gait.     Baseline  6/17: 10 ft    Time  9    Period  Weeks    Target Date  06/10/19      PT LONG TERM GOAL #4   Title  Patient will increase BLE gross strength to 4+/5 as to improve functional strength for independent gait, increased standing tolerance and increased ADL ability    Baseline  6/17: hips grossly 2+/5 knees 3+/5    Time  8    Period  Weeks    Status  New    Target Date  06/10/19            Plan - 04/16/19 1414    Clinical Impression Statement  Patient instructed in intermediate balance/strengthening exercise. Patient fatigues quickly requiring short rest breaks in between intermediate exercise. Patient instructed in advanced strengthening with increased repetition/resistance. Patient requires min assist  for transfers , especially with less rail assist. Patient would benefit from additional skilled PT intervention to improve balance/gait safety and reduce fall risk.    Personal Factors and Comorbidities  Behavior Pattern;Comorbidity 3+;Fitness;Past/Current Experience;Social Background;Time since onset of injury/illness/exacerbation;Transportation    Comorbidities  anxiety, collagen vascular disease, depression, stroke, amputated digit    Examination-Activity Limitations  Bathing;Bed Mobility;Bend;Caring for  Others;Carry;Continence;Dressing;Sleep;Sit;Reach Overhead;Locomotion Level;Lift;Hygiene/Grooming;Squat;Stairs;Stand;Toileting;Transfers    Examination-Participation Restrictions  Church;Cleaning;Community Activity;Driving;Interpersonal Relationship;Meal Prep;Medication Management;Laundry;Shop;Volunteer;Yard Work    Conservation officer, historic buildings  Evolving/Moderate complexity  Rehab Potential  Fair    PT Frequency  2x / week    PT Treatment/Interventions  ADLs/Self Care Home Management;Aquatic Therapy;Cryotherapy;Biofeedback;Electrical Stimulation;Iontophoresis 4mg /ml Dexamethasone;Moist Heat;Traction;Ultrasound;Functional mobility training;Stair training;Gait training;DME Instruction;Therapeutic activities;Therapeutic exercise;Balance training;Neuromuscular re-education;Orthotic Fit/Training;Patient/family education;Cognitive remediation;Wheelchair mobility training;Manual techniques;Taping;Splinting;Energy conservation    PT Next Visit Plan  give HEP, practice SPT, ambulation, COM    PT Home Exercise Plan  give next session    Consulted and Agree with Plan of Care  Family member/caregiver    Family Member Consulted  husband       Patient will benefit from skilled therapeutic intervention in order to improve the following deficits and impairments:  Abnormal gait, Decreased activity tolerance, Decreased coordination, Decreased balance, Decreased endurance, Decreased knowledge of precautions, Decreased knowledge of use of DME, Decreased safety awareness, Decreased range of motion, Decreased mobility, Decreased strength, Difficulty walking, Hypomobility, Increased fascial restricitons, Impaired flexibility, Impaired perceived functional ability, Impaired sensation, Impaired tone, Impaired UE functional use, Postural dysfunction, Improper body mechanics, Pain, Obesity  Visit Diagnosis: 1. Other abnormalities of gait and mobility   2. Other lack of coordination   3. Muscle weakness (generalized)         Problem List Patient Active Problem List   Diagnosis Date Noted  . Collagen vascular disease (HCC) 05/21/2017  . 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 [place], [place], [place] Phone: [phone]   Fax:  [phone]  Name: NAYVIE LIPS MRN: Gayleen Orem Date of Birth: 1945-11-19

## 2019-04-21 ENCOUNTER — Other Ambulatory Visit: Payer: Self-pay

## 2019-04-21 ENCOUNTER — Ambulatory Visit: Payer: Medicare Other | Admitting: Physical Therapy

## 2019-04-21 ENCOUNTER — Encounter: Payer: Self-pay | Admitting: Physical Therapy

## 2019-04-21 DIAGNOSIS — M6281 Muscle weakness (generalized): Secondary | ICD-10-CM

## 2019-04-21 DIAGNOSIS — R2689 Other abnormalities of gait and mobility: Secondary | ICD-10-CM | POA: Diagnosis not present

## 2019-04-21 DIAGNOSIS — R278 Other lack of coordination: Secondary | ICD-10-CM

## 2019-04-21 NOTE — Therapy (Signed)
[place] [facility] MAIN [facility] SERVICES [address] [place], [place], [address] Phone: [phone]   Fax:  [phone]  Physical Therapy Treatment  Patient Details  Name: Ariel Alvarez MRN: [id number] Date of Birth: July 12, 1946 Referring Provider (PT): Dorien Chihuahua   Encounter Date: 04/21/2019  PT End of Session - 04/21/19 1442    Visit Number  3    Number of Visits  16    Date for PT Re-Evaluation  06/10/19    PT Start Time  0200    PT Stop Time  0240    PT Time Calculation (min)  40 min    Equipment Utilized During Treatment  Gait belt    Activity Tolerance  Patient limited by fatigue;Other (comment)   fear of movement   Behavior During Therapy  WFL for tasks assessed/performed;Anxious   tangential nature of conversation, require max encouragement/cueing      Past Medical History:  Diagnosis Date  . Anxiety   . Collagen vascular disease (H[facility])   . Depression   . Stroke [facility])     Past Surgical History:  Procedure Laterality Date  . CHOLECYSTECTOMY      There were no vitals filed for this visit.  Subjective Assessment - 04/21/19 1441    Subjective  Patinet has no new complaints.. she ist having  pain in her LE's 3/10.    Patient is accompained by:  Family member    Pertinent History  Patient presents for instability s/p CVA in 01/2017. Patient was recently diagnosed with osteoporosis in bilateral legs and feet. Is potentially getting surgery in 2 weeks on both feet. After discharge from outpatient physical therapy had one session with home health PT but no other therapy. Reports no falls. Reports walking from living room to kitchen and back to walkway however her LLE gives out frequently . Per husband and patient she was going to an adult day center prior to COVID. Patient has a flat/level entry. Trying to get a power chair at this time from [employer]. Has a manual chair at this time, walker, hemi walker, quad cane.  Doing bird baths but has a walk in tub she can't get in, does have a shower chair. Patient and husband aware that patient will need caregiver with her due to toileting needs.    Limitations  Sitting;Lifting;Standing;Walking;House hold activities    How long can you sit comfortably?  recliner 5 hours, 30 minute sin wheelchair    How long can you stand comfortably?  4 minutes with UE support    How long can you walk comfortably?  15 ft    Patient Stated Goals  to walk better       Treatment:  Sit to stand transfers from wc<> mat and min assist and vc for sequencing and safety x 6 reps  Sit to stand transfers wc <> standing with hemi walker x 5 reps  Gait training with hemi walker 30  Fee x 3t with CGA and wc following , patient has fatigue and pain in her left hip during walking    Patient needs VC for safety and correct hand placement during mobility training and posture correction.                         PT Education - 04/21/19 1441    Education Details  HEP    [name]  Patient    Methods  Explanation    Comprehension  Verbalized understanding;Need further instruction       PT Short Term Goals - 04/15/19 0852      PT SHORT TERM GOAL #1   Title  Patient will perform one sit to stand independently from wheelchair to increase independence with transfers    Baseline  6/17: min/mod A    Time  2    Period  Weeks    Status  New    Target Date  04/29/19      PT SHORT TERM GOAL #2   Title  Patient will be independent in home exercise program to improve strength/mobility for better functional independence with ADLs.    Baseline  HEP to be given next session    Time  2    Status  New    Target Date  04/29/19        PT Long Term Goals - 04/15/19 1165      PT LONG TERM GOAL #1   Title  Patient will perform 5 STS < 40 seconds independently with hemi walker to demonstrate improved home mobility.    Baseline  6/17: unable to perform one time  without Min/Mod A    Time  8    Period  Weeks    Status  New    Target Date  06/10/19      PT LONG TERM GOAL #2   Title  Patient will stand without UE support for 3-5 minutes for ADL performance without LOB to increase independence with dressing and toileting.    Baseline  6/17: unable to stand without UE support    Time  8    Period  Weeks    Status  New    Target Date  06/10/19      PT LONG TERM GOAL #3   Title  Patient will ambulate 60 ft with hemi walker and CGA to improve home mobility and independence with gait.     Baseline  6/17: 10 ft    Time  9    Period  Weeks    Target Date  06/10/19      PT LONG TERM GOAL #4   Title  Patient will increase BLE gross strength to 4+/5 as to improve functional strength for independent gait, increased standing tolerance and increased ADL ability    Baseline  6/17: hips grossly 2+/5 knees 3+/5    Time  8    Period  Weeks    Status  New    Target Date  06/10/19            Plan - 04/21/19 1442    Clinical Impression Statement  Pt requires direction and verbal cues for correct performance of exercises. Patient demonstrates weakness in BUE and performs open and closed chain exercises with no reports of pain. Pt was able to perform all exercises with min assist and VC for technique Pt encouraged continuing HEP .Follow-up as scheduled.    Personal Factors and Comorbidities  Behavior Pattern;Comorbidity 3+;Fitness;Past/Current Experience;Social Background;Time since onset of injury/illness/exacerbation;Transportation    Comorbidities  anxiety, collagen vascular disease, depression, stroke, amputated digit    Examination-Activity Limitations  Bathing;Bed Mobility;Bend;Caring for Others;Carry;Continence;Dressing;Sleep;Sit;Reach Overhead;Locomotion Level;Lift;Hygiene/Grooming;Squat;Stairs;Stand;Toileting;Transfers    Examination-Participation Restrictions  Church;Cleaning;Community Activity;Driving;Interpersonal Relationship;Meal Prep;Medication  Management;Laundry;Shop;Volunteer;Yard Work    Conservation officer, historic buildings  Evolving/Moderate complexity    Rehab Potential  Fair    PT Frequency  2x / week    PT Treatment/Interventions  ADLs/Self Care Home Management;Aquatic Therapy;Cryotherapy;Biofeedback;Electrical Stimulation;Iontophoresis 4mg /ml Dexamethasone;Moist Heat;Traction;Ultrasound;Functional mobility training;Stair training;Gait training;DME  Instruction;Therapeutic activities;Therapeutic exercise;Balance training;Neuromuscular re-education;Orthotic Fit/Training;Patient/family education;Cognitive remediation;Wheelchair mobility training;Manual techniques;Taping;Splinting;Energy conservation    PT Next Visit Plan  give HEP, practice SPT, ambulation, COM    PT Home Exercise Plan  give next session    Consulted and Agree with Plan of Care  Family member/caregiver    Family Member Consulted  husband       Patient will benefit from skilled therapeutic intervention in order to improve the following deficits and impairments:  Abnormal gait, Decreased activity tolerance, Decreased coordination, Decreased balance, Decreased endurance, Decreased knowledge of precautions, Decreased knowledge of use of DME, Decreased safety awareness, Decreased range of motion, Decreased mobility, Decreased strength, Difficulty walking, Hypomobility, Increased fascial restricitons, Impaired flexibility, Impaired perceived functional ability, Impaired sensation, Impaired tone, Impaired UE functional use, Postural dysfunction, Improper body mechanics, Pain, Obesity  Visit Diagnosis: 1. Other abnormalities of gait and mobility   2. Other lack of coordination   3. Muscle weakness (generalized)        Problem List Patient Active Problem List   Diagnosis Date Noted  . Collagen vascular disease ([place]) 05/21/2017  . Gangrene of finger of right hand [facility]) 05/21/2017    Alanson Puls, [place] DPT 04/21/2019, 3:04 PM  [place] MAIN [facility] SERVICES [address] [place], [place], [address] Phone: [phone]   Fax:  [phone]  Name: JAZAE GANDOLFI MRN: [id number] Date of Birth: 1946-10-26

## 2019-04-23 ENCOUNTER — Other Ambulatory Visit: Payer: Self-pay

## 2019-04-23 ENCOUNTER — Ambulatory Visit: Payer: Medicare Other | Admitting: Physical Therapy

## 2019-04-23 ENCOUNTER — Encounter: Payer: Self-pay | Admitting: Physical Therapy

## 2019-04-23 DIAGNOSIS — R2689 Other abnormalities of gait and mobility: Secondary | ICD-10-CM | POA: Diagnosis not present

## 2019-04-23 DIAGNOSIS — R278 Other lack of coordination: Secondary | ICD-10-CM

## 2019-04-23 DIAGNOSIS — M6281 Muscle weakness (generalized): Secondary | ICD-10-CM

## 2019-04-23 NOTE — Therapy (Signed)
[place] [facility] MAIN [facility] SERVICES [address] [place], [place], [address] Phone: [phone]   Fax:  [phone]  Physical Therapy Treatment  Patient Details  Name: Ariel Alvarez MRN: [id number] Date of Birth: November 14, 1945 Referring Provider (PT): Dorien Chihuahua   Encounter Date: 04/23/2019  PT End of Session - 04/23/19 1456    Visit Number  4    Number of Visits  16    Date for PT Re-Evaluation  06/10/19    PT Start Time  0255    PT Stop Time  0335    PT Time Calculation (min)  40 min    Equipment Utilized During Treatment  Gait belt    Activity Tolerance  Patient limited by fatigue;Other (comment)   fear of movement   Behavior During Therapy  WFL for tasks assessed/performed;Anxious   tangential nature of conversation, require max encouragement/cueing      Past Medical History:  Diagnosis Date  . Anxiety   . Collagen vascular disease ([facility])   . Depression   . Stroke [facility])     Past Surgical History:  Procedure Laterality Date  . CHOLECYSTECTOMY      There were no vitals filed for this visit.  Subjective Assessment - 04/23/19 1451    Subjective  Patinet has no new complaints.. she ist having  pain in her LE's 3/10.    Patient is accompained by:  Family member    Pertinent History  Patient presents for instability s/p CVA in 01/2017. Patient was recently diagnosed with osteoporosis in bilateral legs and feet. Is potentially getting surgery in 2 weeks on both feet. After discharge from outpatient physical therapy had one session with home health PT but no other therapy. Reports no falls. Reports walking from living room to kitchen and back to walkway however her LLE gives out frequently . Per husband and patient she was going to an adult day center prior to COVID. Patient has a flat/level entry. Trying to get a power chair at this time from [employer]. Has a manual chair at this time, walker, hemi walker, quad cane.  Doing bird baths but has a walk in tub she can't get in, does have a shower chair. Patient and husband aware that patient will need caregiver with her due to toileting needs.    Limitations  Sitting;Lifting;Standing;Walking;House hold activities    How long can you sit comfortably?  recliner 5 hours, 30 minute sin wheelchair    How long can you stand comfortably?  4 minutes with UE support    How long can you walk comfortably?  15 ft    Patient Stated Goals  to walk better    Currently in Pain?  No/denies    Multiple Pain Sites  No         Treatment:  Sit to stand transfers from wc<> mat and min assist and vc for sequencing and safety x 6 reps  PROM to LLE for stretching hip abd, hamstring stretching x 30 sec x 3   Leg press 30 lbs x 20 reps, ( too difficult to get patient on and off the equipment)   Gait training with hemi walker 30 ft  with CGA and wc following , patient has fatigue and pain in her left hip during walking    Patient needs VC for safety and correct hand placement during mobility training and posture correction.  PT Education - 04/23/19 1456    Education Details  stretching    [name]  Patient    Methods  Explanation    Comprehension  Verbalized understanding;Need further instruction       PT Short Term Goals - 04/15/19 0852      PT SHORT TERM GOAL #1   Title  Patient will perform one sit to stand independently from wheelchair to increase independence with transfers    Baseline  6/17: min/mod A    Time  2    Period  Weeks    Status  New    Target Date  04/29/19      PT SHORT TERM GOAL #2   Title  Patient will be independent in home exercise program to improve strength/mobility for better functional independence with ADLs.    Baseline  HEP to be given next session    Time  2    Status  New    Target Date  04/29/19        PT Long Term Goals - 04/15/19 9371      PT LONG TERM GOAL #1   Title   Patient will perform 5 STS < 40 seconds independently with hemi walker to demonstrate improved home mobility.    Baseline  6/17: unable to perform one time without Min/Mod A    Time  8    Period  Weeks    Status  New    Target Date  06/10/19      PT LONG TERM GOAL #2   Title  Patient will stand without UE support for 3-5 minutes for ADL performance without LOB to increase independence with dressing and toileting.    Baseline  6/17: unable to stand without UE support    Time  8    Period  Weeks    Status  New    Target Date  06/10/19      PT LONG TERM GOAL #3   Title  Patient will ambulate 60 ft with hemi walker and CGA to improve home mobility and independence with gait.     Baseline  6/17: 10 ft    Time  9    Period  Weeks    Target Date  06/10/19      PT LONG TERM GOAL #4   Title  Patient will increase BLE gross strength to 4+/5 as to improve functional strength for independent gait, increased standing tolerance and increased ADL ability    Baseline  6/17: hips grossly 2+/5 knees 3+/5    Time  8    Period  Weeks    Status  New    Target Date  06/10/19            Plan - 04/23/19 1457    Clinical Impression Statement  Patient limited in session today due to tightness in LLE  and weakness. Unable to perform standing exercises today due to leg tightness and tone and weakness, but was able to perform mobility trainng.  Patient will continue to benefit from skilled physical therapy to improve pain and mobility.    Personal Factors and Comorbidities  Behavior Pattern;Comorbidity 3+;Fitness;Past/Current Experience;Social Background;Time since onset of injury/illness/exacerbation;Transportation    Comorbidities  anxiety, collagen vascular disease, depression, stroke, amputated digit    Examination-Activity Limitations  Bathing;Bed Mobility;Bend;Caring for Others;Carry;Continence;Dressing;Sleep;Sit;Reach Overhead;Locomotion  Level;Lift;Hygiene/Grooming;Squat;Stairs;Stand;Toileting;Transfers    Examination-Participation Restrictions  Church;Cleaning;Community Activity;Driving;Interpersonal Relationship;Meal Prep;Medication Management;Laundry;Shop;Volunteer;Yard Work    Database administrator  Fair    PT Frequency  2x / week    PT Treatment/Interventions  ADLs/Self Care Home Management;Aquatic Therapy;Cryotherapy;Biofeedback;Electrical Stimulation;Iontophoresis 4mg /ml Dexamethasone;Moist Heat;Traction;Ultrasound;Functional mobility training;Stair training;Gait training;DME Instruction;Therapeutic activities;Therapeutic exercise;Balance training;Neuromuscular re-education;Orthotic Fit/Training;Patient/family education;Cognitive remediation;Wheelchair mobility training;Manual techniques;Taping;Splinting;Energy conservation    PT Next Visit Plan  give HEP, practice SPT, ambulation, COM    PT Home Exercise Plan  give next session    Consulted and Agree with Plan of Care  Family member/caregiver    Family Member Consulted  husband       Patient will benefit from skilled therapeutic intervention in order to improve the following deficits and impairments:  Abnormal gait, Decreased activity tolerance, Decreased coordination, Decreased balance, Decreased endurance, Decreased knowledge of precautions, Decreased knowledge of use of DME, Decreased safety awareness, Decreased range of motion, Decreased mobility, Decreased strength, Difficulty walking, Hypomobility, Increased fascial restricitons, Impaired flexibility, Impaired perceived functional ability, Impaired sensation, Impaired tone, Impaired UE functional use, Postural dysfunction, Improper body mechanics, Pain, Obesity  Visit Diagnosis: 1. Other abnormalities of gait and mobility   2. Other lack of coordination   3. Muscle weakness (generalized)        Problem List Patient Active Problem List   Diagnosis  Date Noted  . Collagen vascular disease (HCC) 05/21/2017  . 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 [place], [place], [address] Phone: [phone]   Fax:  [phone]  Name: Ariel Alvarez MRN: [id number] Date of Birth: 08-Feb-1946

## 2019-05-05 ENCOUNTER — Other Ambulatory Visit: Payer: Self-pay

## 2019-05-05 ENCOUNTER — Encounter: Payer: Self-pay | Admitting: Physical Therapy

## 2019-05-05 ENCOUNTER — Ambulatory Visit: Payer: Medicare Other | Attending: Internal Medicine | Admitting: Physical Therapy

## 2019-05-05 DIAGNOSIS — R2689 Other abnormalities of gait and mobility: Secondary | ICD-10-CM | POA: Insufficient documentation

## 2019-05-05 DIAGNOSIS — M6281 Muscle weakness (generalized): Secondary | ICD-10-CM | POA: Diagnosis present

## 2019-05-05 DIAGNOSIS — M359 Systemic involvement of connective tissue, unspecified: Secondary | ICD-10-CM | POA: Diagnosis present

## 2019-05-05 DIAGNOSIS — I96 Gangrene, not elsewhere classified: Secondary | ICD-10-CM | POA: Diagnosis present

## 2019-05-05 DIAGNOSIS — R278 Other lack of coordination: Secondary | ICD-10-CM

## 2019-05-05 NOTE — Therapy (Signed)
[facility] [place] MAIN [facility] SERVICES [address] [place], [place], [address] Phone: [phone]   Fax:  [phone]  Physical Therapy Treatment  Patient Details  Name: Ariel Alvarez MRN: [id number] Date of Birth: 19-Jun-1946 Referring Provider (PT): Dorien Chihuahua   Encounter Date: 05/05/2019  PT End of Session - 05/05/19 1511    Visit Number  5    Number of Visits  16    Date for PT Re-Evaluation  06/10/19    PT Start Time  0145    PT Stop Time  0230    PT Time Calculation (min)  45 min    Equipment Utilized During Treatment  Gait belt    Activity Tolerance  Patient limited by fatigue;Other (comment)   fear of movement   Behavior During Therapy  WFL for tasks assessed/performed;Anxious   tangential nature of conversation, require max encouragement/cueing      Past Medical History:  Diagnosis Date  . Anxiety   . Collagen vascular disease (HCC)   . Depression   . Stroke [facility])     Past Surgical History:  Procedure Laterality Date  . CHOLECYSTECTOMY      There were no vitals filed for this visit.  Subjective Assessment - 05/05/19 1351    Subjective  Patinet has no new complaints.. she ist having  pain in her LE's 5/10.    Patient is accompained by:  Family member    Pertinent History  Patient presents for instability s/p CVA in 01/2017. Patient was recently diagnosed with osteoporosis in bilateral legs and feet. Is potentially getting surgery in 2 weeks on both feet. After discharge from outpatient physical therapy had one session with home health PT but no other therapy. Reports no falls. Reports walking from living room to kitchen and back to walkway however her LLE gives out frequently . Per husband and patient she was going to an adult day center prior to COVID. Patient has a flat/level entry. Trying to get a power chair at this time from [employer]. Has a manual chair at this time, walker, hemi walker, quad cane.  Doing bird baths but has a walk in tub she can't get in, does have a shower chair. Patient and husband aware that patient will need caregiver with her due to toileting needs.    Limitations  Sitting;Lifting;Standing;Walking;House hold activities    How long can you sit comfortably?  recliner 5 hours, 30 minute sin wheelchair    How long can you stand comfortably?  4 minutes with UE support    How long can you walk comfortably?  15 ft    Patient Stated Goals  to walk better    Currently in Pain?  Yes    Pain Score  5     Pain Location  Leg    Pain Orientation  Left;Lower    Pain Descriptors / Indicators  Aching    Pain Type  Chronic pain          Treatment: Bridges x 10 x 2   hooklying abd/ER x 10 x 2   Sit to stand transfers from wc<> mat and min assist and vc for sequencing and safety x 6 reps  PROM to LLE for stretching hip abd, hamstring stretching x 30 sec x 3   Leg press 30 lbs x 20 reps, ( too difficult to get patient on and off the equipment)   Gait training with hemi walker 30 ft x 2  with CGA  and wc following , patient has fatigue and pain in her left hip during walking    Patient needs VC for safety and correct hand placement during mobility training and posture correction.                         PT Short Term Goals - 04/15/19 0852      PT SHORT TERM GOAL #1   Title  Patient will perform one sit to stand independently from wheelchair to increase independence with transfers    Baseline  6/17: min/mod A    Time  2    Period  Weeks    Status  New    Target Date  04/29/19      PT SHORT TERM GOAL #2   Title  Patient will be independent in home exercise program to improve strength/mobility for better functional independence with ADLs.    Baseline  HEP to be given next session    Time  2    Status  New    Target Date  04/29/19        PT Long Term Goals - 04/15/19 3790      PT LONG TERM GOAL #1   Title  Patient will perform 5 STS  < 40 seconds independently with hemi walker to demonstrate improved home mobility.    Baseline  6/17: unable to perform one time without Min/Mod A    Time  8    Period  Weeks    Status  New    Target Date  06/10/19      PT LONG TERM GOAL #2   Title  Patient will stand without UE support for 3-5 minutes for ADL performance without LOB to increase independence with dressing and toileting.    Baseline  6/17: unable to stand without UE support    Time  8    Period  Weeks    Status  New    Target Date  06/10/19      PT LONG TERM GOAL #3   Title  Patient will ambulate 60 ft with hemi walker and CGA to improve home mobility and independence with gait.     Baseline  6/17: 10 ft    Time  9    Period  Weeks    Target Date  06/10/19      PT LONG TERM GOAL #4   Title  Patient will increase BLE gross strength to 4+/5 as to improve functional strength for independent gait, increased standing tolerance and increased ADL ability    Baseline  6/17: hips grossly 2+/5 knees 3+/5    Time  8    Period  Weeks    Status  New    Target Date  06/10/19            Plan - 05/05/19 1512    Clinical Impression Statement  Patient demonstrates understanding of HEP with min corrections needed. Patient challenged closed chain and open chain exercises with multiple repetitions due to fatigue. Weak LE combined with weak core musculature results in fair postural control and moderate balance deficits.    Personal Factors and Comorbidities  Behavior Pattern;Comorbidity 3+;Fitness;Past/Current Experience;Social Background;Time since onset of injury/illness/exacerbation;Transportation    Comorbidities  anxiety, collagen vascular disease, depression, stroke, amputated digit    Examination-Activity Limitations  Bathing;Bed Mobility;Bend;Caring for Others;Carry;Continence;Dressing;Sleep;Sit;Reach Overhead;Locomotion Level;Lift;Hygiene/Grooming;Squat;Stairs;Stand;Toileting;Transfers    Examination-Participation  Restrictions  Church;Cleaning;Community Activity;Driving;Interpersonal Relationship;Meal Prep;Medication Management;Laundry;Shop;Volunteer;Yard Work    Conservation officer, historic buildings  Evolving/Moderate complexity    Rehab Potential  Fair    PT Frequency  2x / week    PT Treatment/Interventions  ADLs/Self Care Home Management;Aquatic Therapy;Cryotherapy;Biofeedback;Electrical Stimulation;Iontophoresis 4mg /ml Dexamethasone;Moist Heat;Traction;Ultrasound;Functional mobility training;Stair training;Gait training;DME Instruction;Therapeutic activities;Therapeutic exercise;Balance training;Neuromuscular re-education;Orthotic Fit/Training;Patient/family education;Cognitive remediation;Wheelchair mobility training;Manual techniques;Taping;Splinting;Energy conservation    PT Next Visit Plan  give HEP, practice SPT, ambulation, COM    PT Home Exercise Plan  give next session    Consulted and Agree with Plan of Care  Family member/caregiver    Family Member Consulted  husband       Patient will benefit from skilled therapeutic intervention in order to improve the following deficits and impairments:  Abnormal gait, Decreased activity tolerance, Decreased coordination, Decreased balance, Decreased endurance, Decreased knowledge of precautions, Decreased knowledge of use of DME, Decreased safety awareness, Decreased range of motion, Decreased mobility, Decreased strength, Difficulty walking, Hypomobility, Increased fascial restricitons, Impaired flexibility, Impaired perceived functional ability, Impaired sensation, Impaired tone, Impaired UE functional use, Postural dysfunction, Improper body mechanics, Pain, Obesity  Visit Diagnosis: 1. Other abnormalities of gait and mobility   2. Other lack of coordination   3. Muscle weakness (generalized)        Problem List Patient Active Problem List   Diagnosis Date Noted  . Collagen vascular disease ([place]) 05/21/2017  . Gangrene of finger of right hand  ([place]) 05/21/2017    Alanson Puls, PT DPT 05/05/2019, 3:15 PM  [place] MAIN [facility] SERVICES [address] [place], [place], [address] Phone: [phone]   Fax:  [phone]  Name: Ariel Alvarez MRN: [id number] Date of Birth: September 28, 1946

## 2019-05-07 ENCOUNTER — Encounter: Payer: Self-pay | Admitting: Physical Therapy

## 2019-05-07 ENCOUNTER — Other Ambulatory Visit: Payer: Self-pay

## 2019-05-07 ENCOUNTER — Ambulatory Visit: Payer: Medicare Other | Admitting: Physical Therapy

## 2019-05-07 DIAGNOSIS — R278 Other lack of coordination: Secondary | ICD-10-CM

## 2019-05-07 DIAGNOSIS — R2689 Other abnormalities of gait and mobility: Secondary | ICD-10-CM

## 2019-05-07 DIAGNOSIS — M6281 Muscle weakness (generalized): Secondary | ICD-10-CM

## 2019-05-07 NOTE — Therapy (Signed)
[place] MAIN [facility] SERVICES [address] [place], [place], [address] Phone: [phone]   Fax:  [phone]  Physical Therapy Treatment  Patient Details  Name: Ariel Alvarez MRN: [id number] Date of Birth: 03/23/1946 Referring Provider (PT): Okey Regal   Encounter Date: 05/07/2019  PT End of Session - 05/07/19 1535    Visit Number  6    Number of Visits  16    Date for PT Re-Evaluation  06/10/19    PT Start Time  0230    PT Stop Time  0315    PT Time Calculation (min)  45 min    Equipment Utilized During Treatment  Gait belt    Activity Tolerance  Patient limited by fatigue;Other (comment)   fear of movement   Behavior During Therapy  WFL for tasks assessed/performed;Anxious   tangential nature of conversation, require max encouragement/cueing      Past Medical History:  Diagnosis Date  . Anxiety   . Collagen vascular disease ([place])   . Depression   . Stroke [facility])     Past Surgical History:  Procedure Laterality Date  . CHOLECYSTECTOMY      There were no vitals filed for this visit.  Subjective Assessment - 05/07/19 1534    Subjective  Patinet has no new complaints.. she ist having  pain in her LE's 5/10.    Patient is accompained by:  Family member    Pertinent History  Patient presents for instability s/p CVA in 01/2017. Patient was recently diagnosed with osteoporosis in bilateral legs and feet. Is potentially getting surgery in 2 weeks on both feet. After discharge from outpatient physical therapy had one session with home health PT but no other therapy. Reports no falls. Reports walking from living room to kitchen and back to walkway however her LLE gives out frequently . Per husband and patient she was going to an adult day center prior to [place]. Patient has a flat/level entry. Trying to get a power chair at this time from [employer]. Has a manual chair at this time, walker, hemi walker, quad cane.  Doing bird baths but has a walk in tub she can't get in, does have a shower chair. Patient and husband aware that patient will need caregiver with her due to toileting needs.    Limitations  Sitting;Lifting;Standing;Walking;House hold activities    How long can you sit comfortably?  recliner 5 hours, 30 minute sin wheelchair    How long can you stand comfortably?  4 minutes with UE support    How long can you walk comfortably?  15 ft    Patient Stated Goals  to walk better    Currently in Pain?  Yes    Pain Score  5     Pain Location  Leg    Pain Orientation  Left    Pain Descriptors / Indicators  Aching        Treatment:  Bridges x 10 x 2   hooklying abd/ER x 10 x 2   Sit to stand transfers from wc<> mat and min assist and vc for sequencing and safety x 6 reps  PROM to LLE for stretching hip abd, hamstring stretching x 30 sec x 3   Leg press 30 lbs x 20 reps, ( too difficult to get patient on and off the equipment)  Gait training with hemi walker 82ft x 4with CGA and wc following , patient has fatigue and pain in her left hip  during walking    Patient needs VC for safety and correct hand placement during mobility training and posture correction.                         PT Education - 05/07/19 1535    Education Details  HEP    [name]  Patient    Methods  Explanation    Comprehension  Verbalized understanding;Verbal cues required       PT Short Term Goals - 04/15/19 0852      PT SHORT TERM GOAL #1   Title  Patient will perform one sit to stand independently from wheelchair to increase independence with transfers    Baseline  6/17: min/mod A    Time  2    Period  Weeks    Status  New    Target Date  04/29/19      PT SHORT TERM GOAL #2   Title  Patient will be independent in home exercise program to improve strength/mobility for better functional independence with ADLs.    Baseline  HEP to be given next session    Time  2     Status  New    Target Date  04/29/19        PT Long Term Goals - 04/15/19 4235      PT LONG TERM GOAL #1   Title  Patient will perform 5 STS < 40 seconds independently with hemi walker to demonstrate improved home mobility.    Baseline  6/17: unable to perform one time without Min/Mod A    Time  8    Period  Weeks    Status  New    Target Date  06/10/19      PT LONG TERM GOAL #2   Title  Patient will stand without UE support for 3-5 minutes for ADL performance without LOB to increase independence with dressing and toileting.    Baseline  6/17: unable to stand without UE support    Time  8    Period  Weeks    Status  New    Target Date  06/10/19      PT LONG TERM GOAL #3   Title  Patient will ambulate 60 ft with hemi walker and CGA to improve home mobility and independence with gait.     Baseline  6/17: 10 ft    Time  9    Period  Weeks    Target Date  06/10/19      PT LONG TERM GOAL #4   Title  Patient will increase BLE gross strength to 4+/5 as to improve functional strength for independent gait, increased standing tolerance and increased ADL ability    Baseline  6/17: hips grossly 2+/5 knees 3+/5    Time  8    Period  Weeks    Status  New    Target Date  06/10/19            Plan - 05/07/19 1536    Clinical Impression Statement  Pt presents with unsteadiness on even surfaces and fatigues with therapeutic exercises. Patient needs assist with instruction for balance with side stepping on even surfaces and needs CGA assist with balance activities. Patient demonstrates difficulty with side stepping and navigating small spaces with decreased base of support and increased challenges for LE.  Patient tolerated all interventions well this date and will benefit from continued skilled PT interventions to improve strength and balance and  decrease risk of falling    Personal Factors and Comorbidities  Behavior Pattern;Comorbidity 3+;Fitness;Past/Current Experience;Social  Background;Time since onset of injury/illness/exacerbation;Transportation    Comorbidities  anxiety, collagen vascular disease, depression, stroke, amputated digit    Examination-Activity Limitations  Bathing;Bed Mobility;Bend;Caring for Others;Carry;Continence;Dressing;Sleep;Sit;Reach Overhead;Locomotion Level;Lift;Hygiene/Grooming;Squat;Stairs;Stand;Toileting;Transfers    Examination-Participation Restrictions  Church;Cleaning;Community Activity;Driving;Interpersonal Relationship;Meal Prep;Medication Management;Laundry;Shop;Volunteer;Yard Work    Conservation officer, historic buildingstability/Clinical Decision Making  Evolving/Moderate complexity    Rehab Potential  Fair    PT Frequency  2x / week    PT Treatment/Interventions  ADLs/Self Care Home Management;Aquatic Therapy;Cryotherapy;Biofeedback;Electrical Stimulation;Iontophoresis 4mg /ml Dexamethasone;Moist Heat;Traction;Ultrasound;Functional mobility training;Stair training;Gait training;DME Instruction;Therapeutic activities;Therapeutic exercise;Balance training;Neuromuscular re-education;Orthotic Fit/Training;Patient/family education;Cognitive remediation;Wheelchair mobility training;Manual techniques;Taping;Splinting;Energy conservation    PT Next Visit Plan  give HEP, practice SPT, ambulation, COM    PT Home Exercise Plan  give next session    Consulted and Agree with Plan of Care  Family member/caregiver    Family Member Consulted  husband       Patient will benefit from skilled therapeutic intervention in order to improve the following deficits and impairments:  Abnormal gait, Decreased activity tolerance, Decreased coordination, Decreased balance, Decreased endurance, Decreased knowledge of precautions, Decreased knowledge of use of DME, Decreased safety awareness, Decreased range of motion, Decreased mobility, Decreased strength, Difficulty walking, Hypomobility, Increased fascial restricitons, Impaired flexibility, Impaired perceived functional ability, Impaired sensation,  Impaired tone, Impaired UE functional use, Postural dysfunction, Improper body mechanics, Pain, Obesity  Visit Diagnosis: 1. Other abnormalities of gait and mobility   2. Other lack of coordination   3. Muscle weakness (generalized)        Problem List Patient Active Problem List   Diagnosis Date Noted  . Collagen vascular disease (HCC) 05/21/2017  . 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) 05/21/2017    Ezekiel InaMansfield, Abrahim Sargent S, PT DPT 05/07/2019, 3:39 PM  [place] [facility] [address] [place], [place], [id number] Phone: [phone]   Fax:  [phone]  Name: Ariel Alvarez MRN: [id number] Date of Birth: 20-Apr-1946

## 2019-05-12 ENCOUNTER — Ambulatory Visit: Payer: Medicare Other | Admitting: Physical Therapy

## 2019-05-12 ENCOUNTER — Other Ambulatory Visit: Payer: Self-pay

## 2019-05-12 DIAGNOSIS — R278 Other lack of coordination: Secondary | ICD-10-CM

## 2019-05-12 DIAGNOSIS — R2689 Other abnormalities of gait and mobility: Secondary | ICD-10-CM | POA: Diagnosis not present

## 2019-05-12 DIAGNOSIS — M359 Systemic involvement of connective tissue, unspecified: Secondary | ICD-10-CM

## 2019-05-12 DIAGNOSIS — I96 Gangrene, not elsewhere classified: Secondary | ICD-10-CM

## 2019-05-12 DIAGNOSIS — M6281 Muscle weakness (generalized): Secondary | ICD-10-CM

## 2019-05-12 NOTE — Therapy (Signed)
[place] [facility] MAIN [facility] SERVICES [address] [place], [place], [address] Phone: [phone]   Fax:  [phone]  Physical Therapy Treatment  Patient Details  Name: Ariel Alvarez MRN: [id number] Date of Birth: [birth year] Referring Provider (PT): Dorien Chihuahua   Encounter Date: 05/12/2019  PT End of Session - 05/12/19 1423    Visit Number  7    Number of Visits  16    Date for PT Re-Evaluation  06/10/19    Equipment Utilized During Treatment  Gait belt    Activity Tolerance  Patient limited by fatigue;Other (comment)   fear of movement   Behavior During Therapy  WFL for tasks assessed/performed;Anxious   tangential nature of conversation, require max encouragement/cueing      Past Medical History:  Diagnosis Date  . Anxiety   . Collagen vascular disease (HCC)   . Depression   . Stroke [facility])     Past Surgical History:  Procedure Laterality Date  . CHOLECYSTECTOMY      There were no vitals filed for this visit.  Subjective Assessment - 05/12/19 1422    Subjective  Patinet has no new complaints.. she ist having  pain in her LE's 5/10.    Patient is accompained by:  Family member    Pertinent History  Patient presents for instability s/p CVA in 01/2017. Patient was recently diagnosed with osteoporosis in bilateral legs and feet. Is potentially getting surgery in 2 weeks on both feet. After discharge from outpatient physical therapy had one session with home health PT but no other therapy. Reports no falls. Reports walking from living room to kitchen and back to walkway however her LLE gives out frequently . Per husband and patient she was going to an adult day center prior to COVID. Patient has a flat/level entry. Trying to get a power chair at this time from [employer]. Has a manual chair at this time, walker, hemi walker, quad cane. Doing bird baths but has a walk in tub she can't get in, does have a shower chair.  Patient and husband aware that patient will need caregiver with her due to toileting needs.    Limitations  Sitting;Lifting;Standing;Walking;House hold activities    How long can you sit comfortably?  recliner 5 hours, 30 minute sin wheelchair    How long can you stand comfortably?  4 minutes with UE support    How long can you walk comfortably?  15 ft    Patient Stated Goals  to walk better    Currently in Pain?  No/denies    Pain Score  0-No pain         Ther-ex  NuStep L2 x 5 minutes during history (3 minutes unbilled); Standing marching with 2# ankle weights x 15; Standing hip extension with 2# ankle weights x 15; Standing hip abduction with 2# ankle weights x 15;  Standing HS curls 2# ankle weights x 15;   Neuromuscular Re-education  Airex balance with toe taps to 6" step alternating LE x 10 each, faded UE support, pt able to perform with 2 finger support at the end of the set; Static balance on 1/2 bolster (flat side up) with no UE support 30s x 2; Standing on 1/2 bolster (Flat side up) heel/toe rock x15 with no UE hold for balance but intermittent assist from therapist to prevent falls especially with heel rocking and posterior facility increased muscle building. Therapeutic rest breaks for energy conservation  PT Education - 05/12/19 1449    Education Details  HEP    [name]  Patient    Methods  Explanation    Comprehension  Verbalized understanding;Verbal cues required       PT Short Term Goals - 04/15/19 0852      PT SHORT TERM GOAL #1   Title  Patient will perform one sit to stand independently from wheelchair to increase independence with transfers    Baseline  6/17: min/mod A    Time  2    Period  Weeks    Status  New    Target Date  04/29/19      PT SHORT TERM GOAL #2   Title  Patient will be independent in home exercise program to improve strength/mobility for better functional independence with ADLs.     Baseline  HEP to be given next session    Time  2    Status  New    Target Date  04/29/19        PT Long Term Goals - 04/15/19 4944      PT LONG TERM GOAL #1   Title  Patient will perform 5 STS < 40 seconds independently with hemi walker to demonstrate improved home mobility.    Baseline  6/17: unable to perform one time without Min/Mod A    Time  8    Period  Weeks    Status  New    Target Date  06/10/19      PT LONG TERM GOAL #2   Title  Patient will stand without UE support for 3-5 minutes for ADL performance without LOB to increase independence with dressing and toileting.    Baseline  6/17: unable to stand without UE support    Time  8    Period  Weeks    Status  New    Target Date  06/10/19      PT LONG TERM GOAL #3   Title  Patient will ambulate 60 ft with hemi walker and CGA to improve home mobility and independence with gait.     Baseline  6/17: 10 ft    Time  9    Period  Weeks    Target Date  06/10/19      PT LONG TERM GOAL #4   Title  Patient will increase BLE gross strength to 4+/5 as to improve functional strength for independent gait, increased standing tolerance and increased ADL ability    Baseline  6/17: hips grossly 2+/5 knees 3+/5    Time  8    Period  Weeks    Status  New    Target Date  06/10/19            Plan - 05/12/19 1423    Clinical Impression Statement  Pt presents shuffling feet with ambulation with hemi walker and 40 feet x 2  and min assist.  Patient requires cues for upright posture and forward gaze with all exercises but tolerated all exercises well. Pt performed well with cues to exaggerate motions with stepping activity. Patient stands at sink for multiple times for several minutes each.  Patent will benefit from continued skilled PT interventions for improved posture, strength, balance, and QOL    Personal Factors and Comorbidities  Behavior Pattern;Comorbidity 3+;Fitness;Past/Current Experience;Social Background;Time since onset  of injury/illness/exacerbation;Transportation    Comorbidities  anxiety, collagen vascular disease, depression, stroke, amputated digit    Examination-Activity Limitations  Bathing;Bed Mobility;Bend;Caring for Others;Carry;Continence;Dressing;Sleep;Sit;Reach Overhead;Locomotion Level;Lift;Hygiene/Grooming;Squat;Stairs;Stand;Toileting;Transfers  Examination-Participation Restrictions  Church;Cleaning;Community Activity;Driving;Interpersonal Relationship;Meal Prep;Medication Management;Laundry;Shop;Volunteer;Yard Work    Conservation officer, historic buildings  Evolving/Moderate complexity    Rehab Potential  Fair    PT Frequency  2x / week    PT Treatment/Interventions  ADLs/Self Care Home Management;Aquatic Therapy;Cryotherapy;Biofeedback;Electrical Stimulation;Iontophoresis 4mg /ml Dexamethasone;Moist Heat;Traction;Ultrasound;Functional mobility training;Stair training;Gait training;DME Instruction;Therapeutic activities;Therapeutic exercise;Balance training;Neuromuscular re-education;Orthotic Fit/Training;Patient/family education;Cognitive remediation;Wheelchair mobility training;Manual techniques;Taping;Splinting;Energy conservation    PT Next Visit Plan  give HEP, practice SPT, ambulation, COM    PT Home Exercise Plan  give next session    Consulted and Agree with Plan of Care  Family member/caregiver    Family Member Consulted  husband       Patient will benefit from skilled therapeutic intervention in order to improve the following deficits and impairments:  Abnormal gait, Decreased activity tolerance, Decreased coordination, Decreased balance, Decreased endurance, Decreased knowledge of precautions, Decreased knowledge of use of DME, Decreased safety awareness, Decreased range of motion, Decreased mobility, Decreased strength, Difficulty walking, Hypomobility, Increased fascial restricitons, Impaired flexibility, Impaired perceived functional ability, Impaired sensation, Impaired tone, Impaired UE  functional use, Postural dysfunction, Improper body mechanics, Pain, Obesity  Visit Diagnosis: 1. Other abnormalities of gait and mobility   2. Collagen vascular disease (HCC)   3. 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 Other lack of coordination   5. Muscle weakness (generalized)        Problem List Patient Active Problem List   Diagnosis Date Noted  . Collagen vascular disease (HCC) 05/21/2017  . 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

## 2019-05-12 NOTE — Therapy (Signed)
[place] [facility] MAIN [facility] SERVICES [address] [place], [place], [address] Phone: [phone]   Fax:  [phone]  Physical Therapy Treatment  Patient Details  Name: Ariel Alvarez MRN: [id number] Date of Birth: [birth year] Referring Provider (PT): Dorien Chihuahua   Encounter Date: 05/12/2019  PT End of Session - 05/12/19 1423    Visit Number  7    Number of Visits  16    Date for PT Re-Evaluation  06/10/19    Equipment Utilized During Treatment  Gait belt    Activity Tolerance  Patient limited by fatigue;Other (comment)   fear of movement   Behavior During Therapy  WFL for tasks assessed/performed;Anxious   tangential nature of conversation, require max encouragement/cueing      Past Medical History:  Diagnosis Date  . Anxiety   . Collagen vascular disease (HCC)   . Depression   . Stroke [facility])     Past Surgical History:  Procedure Laterality Date  . CHOLECYSTECTOMY      There were no vitals filed for this visit.  Subjective Assessment - 05/12/19 1422    Subjective  Patinet has no new complaints.. she ist having  pain in her LE's 5/10.    Patient is accompained by:  Family member    Pertinent History  Patient presents for instability s/p CVA in 01/2017. Patient was recently diagnosed with osteoporosis in bilateral legs and feet. Is potentially getting surgery in 2 weeks on both feet. After discharge from outpatient physical therapy had one session with home health PT but no other therapy. Reports no falls. Reports walking from living room to kitchen and back to walkway however her LLE gives out frequently . Per husband and patient she was going to an adult day center prior to COVID. Patient has a flat/level entry. Trying to get a power chair at this time from [employer]. Has a manual chair at this time, walker, hemi walker, quad cane. Doing bird baths but has a walk in tub she can't get in, does have a shower chair.  Patient and husband aware that patient will need caregiver with her due to toileting needs.    Limitations  Sitting;Lifting;Standing;Walking;House hold activities    How long can you sit comfortably?  recliner 5 hours, 30 minute sin wheelchair    How long can you stand comfortably?  4 minutes with UE support    How long can you walk comfortably?  15 ft    Patient Stated Goals  to walk better    Currently in Pain?  No/denies    Pain Score  0-No pain       Treatment: Standing at sink x 10 repetitions x 2 - 3 mins, weight shifting and use of RUE to stir and make a craft Transfer training from wc <> standing with VC for hand placement and sequencing  Gait training with hemi walker 40 feet x 2 with wc following and min assist.                         PT Education - 05/12/19 1449    Education Details  HEP    [name]  Patient    Methods  Explanation    Comprehension  Verbalized understanding;Verbal cues required       PT Short Term Goals - 04/15/19 0852      PT SHORT TERM GOAL #1   Title  Patient will perform one sit to  stand independently from wheelchair to increase independence with transfers    Baseline  6/17: min/mod A    Time  2    Period  Weeks    Status  New    Target Date  04/29/19      PT SHORT TERM GOAL #2   Title  Patient will be independent in home exercise program to improve strength/mobility for better functional independence with ADLs.    Baseline  HEP to be given next session    Time  2    Status  New    Target Date  04/29/19        PT Long Term Goals - 04/15/19 3428      PT LONG TERM GOAL #1   Title  Patient will perform 5 STS < 40 seconds independently with hemi walker to demonstrate improved home mobility.    Baseline  6/17: unable to perform one time without Min/Mod A    Time  8    Period  Weeks    Status  New    Target Date  06/10/19      PT LONG TERM GOAL #2   Title  Patient will stand without UE support for 3-5  minutes for ADL performance without LOB to increase independence with dressing and toileting.    Baseline  6/17: unable to stand without UE support    Time  8    Period  Weeks    Status  New    Target Date  06/10/19      PT LONG TERM GOAL #3   Title  Patient will ambulate 60 ft with hemi walker and CGA to improve home mobility and independence with gait.     Baseline  6/17: 10 ft    Time  9    Period  Weeks    Target Date  06/10/19      PT LONG TERM GOAL #4   Title  Patient will increase BLE gross strength to 4+/5 as to improve functional strength for independent gait, increased standing tolerance and increased ADL ability    Baseline  6/17: hips grossly 2+/5 knees 3+/5    Time  8    Period  Weeks    Status  New    Target Date  06/10/19            Plan - 05/12/19 1423    Clinical Impression Statement  Pt presents shuffling feet with ambulation with hemi walker and 40 feet x 2  and min assist.  Patient requires cues for upright posture and forward gaze with all exercises but tolerated all exercises well. Pt performed well with cues to exaggerate motions with stepping activity. Patient stands at sink for multiple times for several minutes each.  Patent will benefit from continued skilled PT interventions for improved posture, strength, balance, and QOL    Personal Factors and Comorbidities  Behavior Pattern;Comorbidity 3+;Fitness;Past/Current Experience;Social Background;Time since onset of injury/illness/exacerbation;Transportation    Comorbidities  anxiety, collagen vascular disease, depression, stroke, amputated digit    Examination-Activity Limitations  Bathing;Bed Mobility;Bend;Caring for Others;Carry;Continence;Dressing;Sleep;Sit;Reach Overhead;Locomotion Level;Lift;Hygiene/Grooming;Squat;Stairs;Stand;Toileting;Transfers    Examination-Participation Restrictions  Church;Cleaning;Community Activity;Driving;Interpersonal Relationship;Meal Prep;Medication  Management;Laundry;Shop;Volunteer;Yard Work    Conservation officer, historic buildings  Evolving/Moderate complexity    Rehab Potential  Fair    PT Frequency  2x / week    PT Treatment/Interventions  ADLs/Self Care Home Management;Aquatic Therapy;Cryotherapy;Biofeedback;Electrical Stimulation;Iontophoresis 4mg /ml Dexamethasone;Moist Heat;Traction;Ultrasound;Functional mobility training;Stair training;Gait training;DME Instruction;Therapeutic activities;Therapeutic exercise;Balance training;Neuromuscular re-education;Orthotic Fit/Training;Patient/family education;Cognitive remediation;Wheelchair mobility training;Manual techniques;Taping;Splinting;Energy conservation  PT Next Visit Plan  give HEP, practice SPT, ambulation, COM    PT Home Exercise Plan  give next session    Consulted and Agree with Plan of Care  Family member/caregiver    Family Member Consulted  husband       Patient will benefit from skilled therapeutic intervention in order to improve the following deficits and impairments:  Abnormal gait, Decreased activity tolerance, Decreased coordination, Decreased balance, Decreased endurance, Decreased knowledge of precautions, Decreased knowledge of use of DME, Decreased safety awareness, Decreased range of motion, Decreased mobility, Decreased strength, Difficulty walking, Hypomobility, Increased fascial restricitons, Impaired flexibility, Impaired perceived functional ability, Impaired sensation, Impaired tone, Impaired UE functional use, Postural dysfunction, Improper body mechanics, Pain, Obesity  Visit Diagnosis: 1. Other abnormalities of gait and mobility   2. Collagen vascular disease (HCC)   3. 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 Other lack of coordination   5. Muscle weakness (generalized)        Problem List Patient Active Problem List   Diagnosis Date Noted  . Collagen vascular disease ([place]) 05/21/2017  . Gangrene of finger of right hand ([place]) 05/21/2017     Alanson Puls, [place] DPT 05/12/2019, 2:57 PM  [place] MAIN [facility] SERVICES [address] [place], [place], [address] Phone: [phone]   Fax:  [phone]  Name: KEANNA TUGWELL MRN: [id number] Date of Birth: Jan 04, 1946

## 2019-05-14 ENCOUNTER — Other Ambulatory Visit: Payer: Self-pay

## 2019-05-14 ENCOUNTER — Encounter: Payer: Self-pay | Admitting: Physical Therapy

## 2019-05-14 ENCOUNTER — Ambulatory Visit: Payer: Medicare Other | Admitting: Physical Therapy

## 2019-05-14 DIAGNOSIS — R2689 Other abnormalities of gait and mobility: Secondary | ICD-10-CM | POA: Diagnosis not present

## 2019-05-14 DIAGNOSIS — M359 Systemic involvement of connective tissue, unspecified: Secondary | ICD-10-CM

## 2019-05-14 DIAGNOSIS — R278 Other lack of coordination: Secondary | ICD-10-CM

## 2019-05-14 DIAGNOSIS — M6281 Muscle weakness (generalized): Secondary | ICD-10-CM

## 2019-05-14 NOTE — Therapy (Signed)
[facility] MAIN [facility] SERVICES [address] [place], [place], [address] Phone: [phone]   Fax:  [phone]  Physical Therapy Treatment  Patient Details  Name: Ariel Alvarez MRN: [id number] Date of Birth: 1946-06-11 Referring Provider (PT): Dorien Chihuahua   Encounter Date: 05/14/2019  PT End of Session - 05/14/19 1445    Visit Number  8    Number of Visits  16    Date for PT Re-Evaluation  06/10/19    PT Start Time  0235    PT Stop Time  0315    PT Time Calculation (min)  40 min    Equipment Utilized During Treatment  Gait belt    Activity Tolerance  Patient limited by fatigue;Other (comment)   fear of movement   Behavior During Therapy  WFL for tasks assessed/performed;Anxious   tangential nature of conversation, require max encouragement/cueing      Past Medical History:  Diagnosis Date  . Anxiety   . Collagen vascular disease (HCC)   . Depression   . Stroke [facility])     Past Surgical History:  Procedure Laterality Date  . CHOLECYSTECTOMY      There were no vitals filed for this visit.  Subjective Assessment - 05/14/19 1444    Subjective  Patinet has no new complaints.. she ist having  pain in her LE's 5/10.    Patient is accompained by:  Family member    Pertinent History  Patient presents for instability s/p CVA in 01/2017. Patient was recently diagnosed with osteoporosis in bilateral legs and feet. Is potentially getting surgery in 2 weeks on both feet. After discharge from outpatient physical therapy had one session with home health PT but no other therapy. Reports no falls. Reports walking from living room to kitchen and back to walkway however her LLE gives out frequently . Per husband and patient she was going to an adult day center prior to COVID. Patient has a flat/level entry. Trying to get a power chair at this time from [employer]. Has a manual chair at this time, walker, hemi walker, quad cane.  Doing bird baths but has a walk in tub she can't get in, does have a shower chair. Patient and husband aware that patient will need caregiver with her due to toileting needs.    Limitations  Sitting;Lifting;Standing;Walking;House hold activities    How long can you sit comfortably?  recliner 5 hours, 30 minute sin wheelchair    How long can you stand comfortably?  4 minutes with UE support    How long can you walk comfortably?  15 ft    Patient Stated Goals  to walk better    Currently in Pain?  Yes    Pain Score  5     Pain Location  Foot    Pain Orientation  Left    Pain Descriptors / Indicators  Aching        Therapeutic activiites: Transfer <> multi level surfaces <> nu-step with min / mod assist x 4 transfers   Transfers sit to stand with hemiwalker and min assist and Vc for safety and sequencing  PROM to left heel cord and left knee hamstring x 30 sec x 3   Gait training with hemi walker 30 feet x 3   Standing and weight shifting left and right x 2 mins x 3 reps    Pt educated  about  posture and technique with exercises to improve exercise technique, movement at  target joints, with min  verbal, visual, tactile cues.                    PT Education - 05/14/19 1445    Education Details  HEP    [name]  Patient    Methods  Explanation    Comprehension  Verbalized understanding;Returned demonstration;Verbal cues required;Tactile cues required;Need further instruction       PT Short Term Goals - 04/15/19 0852      PT SHORT TERM GOAL #1   Title  Patient will perform one sit to stand independently from wheelchair to increase independence with transfers    Baseline  6/17: min/mod A    Time  2    Period  Weeks    Status  New    Target Date  04/29/19      PT SHORT TERM GOAL #2   Title  Patient will be independent in home exercise program to improve strength/mobility for better functional independence with ADLs.    Baseline  HEP to be given  next session    Time  2    Status  New    Target Date  04/29/19        PT Long Term Goals - 04/15/19 4315      PT LONG TERM GOAL #1   Title  Patient will perform 5 STS < 40 seconds independently with hemi walker to demonstrate improved home mobility.    Baseline  6/17: unable to perform one time without Min/Mod A    Time  8    Period  Weeks    Status  New    Target Date  06/10/19      PT LONG TERM GOAL #2   Title  Patient will stand without UE support for 3-5 minutes for ADL performance without LOB to increase independence with dressing and toileting.    Baseline  6/17: unable to stand without UE support    Time  8    Period  Weeks    Status  New    Target Date  06/10/19      PT LONG TERM GOAL #3   Title  Patient will ambulate 60 ft with hemi walker and CGA to improve home mobility and independence with gait.     Baseline  6/17: 10 ft    Time  9    Period  Weeks    Target Date  06/10/19      PT LONG TERM GOAL #4   Title  Patient will increase BLE gross strength to 4+/5 as to improve functional strength for independent gait, increased standing tolerance and increased ADL ability    Baseline  6/17: hips grossly 2+/5 knees 3+/5    Time  8    Period  Weeks    Status  New    Target Date  06/10/19            Plan - 05/14/19 1445    Clinical Impression Statement  Patient has increased tone in LLE  and decreased strength in LLE . She performs transfer training from multi level surfaces with min and mod assist. She needs cues for sequencing and hand placement. She will conitnue to benefit from skilled PT to improve mobility and safety.    Personal Factors and Comorbidities  Behavior Pattern;Comorbidity 3+;Fitness;Past/Current Experience;Social Background;Time since onset of injury/illness/exacerbation;Transportation    Comorbidities  anxiety, collagen vascular disease, depression, stroke, amputated digit    Examination-Activity Limitations  Bathing;Bed Mobility;Bend;Caring  for Others;Carry;Continence;Dressing;Sleep;Sit;Reach Overhead;Locomotion Level;Lift;Hygiene/Grooming;Squat;Stairs;Stand;Toileting;Transfers    Examination-Participation Restrictions  Church;Cleaning;Community Activity;Driving;Interpersonal Relationship;Meal Prep;Medication Management;Laundry;Shop;Volunteer;Yard Work    Conservation officer, historic buildings  Evolving/Moderate complexity    Rehab Potential  Fair    PT Frequency  2x / week    PT Treatment/Interventions  ADLs/Self Care Home Management;Aquatic Therapy;Cryotherapy;Biofeedback;Electrical Stimulation;Iontophoresis 4mg /ml Dexamethasone;Moist Heat;Traction;Ultrasound;Functional mobility training;Stair training;Gait training;DME Instruction;Therapeutic activities;Therapeutic exercise;Balance training;Neuromuscular re-education;Orthotic Fit/Training;Patient/family education;Cognitive remediation;Wheelchair mobility training;Manual techniques;Taping;Splinting;Energy conservation    PT Next Visit Plan  give HEP, practice SPT, ambulation, COM    PT Home Exercise Plan  give next session    Consulted and Agree with Plan of Care  Family member/caregiver    Family Member Consulted  husband       Patient will benefit from skilled therapeutic intervention in order to improve the following deficits and impairments:  Abnormal gait, Decreased activity tolerance, Decreased coordination, Decreased balance, Decreased endurance, Decreased knowledge of precautions, Decreased knowledge of use of DME, Decreased safety awareness, Decreased range of motion, Decreased mobility, Decreased strength, Difficulty walking, Hypomobility, Increased fascial restricitons, Impaired flexibility, Impaired perceived functional ability, Impaired sensation, Impaired tone, Impaired UE functional use, Postural dysfunction, Improper body mechanics, Pain, Obesity  Visit Diagnosis: 1. Collagen vascular disease (HCC)   2. Other abnormalities of gait and mobility   3. Muscle weakness  (generalized)   4. Other lack of coordination        Problem List Patient Active Problem List   Diagnosis Date Noted  . Collagen vascular disease (HCC) 05/21/2017  . 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 [place], [place], [address] Phone: [phone]   Fax:  [phone]  Name: Ariel Alvarez MRN: [id number] Date of Birth: Mar 17, 1946

## 2019-05-18 DIAGNOSIS — R102 Pelvic and perineal pain: Secondary | ICD-10-CM | POA: Insufficient documentation

## 2019-05-19 ENCOUNTER — Encounter: Payer: Self-pay | Admitting: Physical Therapy

## 2019-05-19 ENCOUNTER — Other Ambulatory Visit: Payer: Self-pay

## 2019-05-19 ENCOUNTER — Ambulatory Visit: Payer: Medicare Other | Admitting: Physical Therapy

## 2019-05-19 DIAGNOSIS — R278 Other lack of coordination: Secondary | ICD-10-CM

## 2019-05-19 DIAGNOSIS — M359 Systemic involvement of connective tissue, unspecified: Secondary | ICD-10-CM

## 2019-05-19 DIAGNOSIS — I96 Gangrene, not elsewhere classified: Secondary | ICD-10-CM

## 2019-05-19 DIAGNOSIS — R2689 Other abnormalities of gait and mobility: Secondary | ICD-10-CM

## 2019-05-19 DIAGNOSIS — M6281 Muscle weakness (generalized): Secondary | ICD-10-CM

## 2019-05-19 NOTE — Therapy (Signed)
[place] [facility] MAIN [facility] SERVICES [address] [place], [place], [address] Phone: [phone]   Fax:  [phone]  Physical Therapy Treatment  Patient Details  Name: Ariel Alvarez MRN: [id number] Date of Birth: 25-Jun-1946 Referring Provider (PT): Dorien Chihuahua   Encounter Date: 05/19/2019  PT End of Session - 05/19/19 1513    Visit Number  9    Number of Visits  16    Date for PT Re-Evaluation  06/10/19    PT Start Time  0245    PT Stop Time  0330    PT Time Calculation (min)  45 min    Equipment Utilized During Treatment  Gait belt    Activity Tolerance  Patient limited by fatigue;Other (comment)   fear of movement   Behavior During Therapy  WFL for tasks assessed/performed;Anxious   tangential nature of conversation, require max encouragement/cueing      Past Medical History:  Diagnosis Date  . Anxiety   . Collagen vascular disease (HCC)   . Depression   . Stroke [facility])     Past Surgical History:  Procedure Laterality Date  . CHOLECYSTECTOMY      There were no vitals filed for this visit.  Subjective Assessment - 05/19/19 1512    Subjective  Patinet has no new complaints.    Patient is accompained by:  Family member    Pertinent History  Patient presents for instability s/p CVA in 01/2017. Patient was recently diagnosed with osteoporosis in bilateral legs and feet. Is potentially getting surgery in 2 weeks on both feet. After discharge from outpatient physical therapy had one session with home health PT but no other therapy. Reports no falls. Reports walking from living room to kitchen and back to walkway however her LLE gives out frequently . Per husband and patient she was going to an adult day center prior to COVID. Patient has a flat/level entry. Trying to get a power chair at this time from [employer]. Has a manual chair at this time, walker, hemi walker, quad cane. Doing bird baths but has a walk in tub she  can't get in, does have a shower chair. Patient and husband aware that patient will need caregiver with her due to toileting needs.    Limitations  Sitting;Lifting;Standing;Walking;House hold activities    How long can you sit comfortably?  recliner 5 hours, 30 minute sin wheelchair    How long can you stand comfortably?  4 minutes with UE support    How long can you walk comfortably?  15 ft    Patient Stated Goals  to walk better         Therapeutic activiites:  Hip flex x 20 x 3   LAQ x 20 w lbs x 3 sets  Transfer <> multi level surfaces <> nu-step with min / mod assist x 4 transfers   Transfers sit to stand with hemiwalker and min assist and Vc for safety and sequencing  PROM to left heel cord and left knee hamstring x 30 sec x 3   Gait training with hemi walker 30 feet x 3   Standing and weight shifting left and right x 2 mins x 3 reps    Pt educated  about  posture and technique with exercises to improve exercise technique, movement at target joints, with min  verbal, visual, tactile cues.Therapeutic activiites: Transfer <> multi level surfaces <> nu-step with min / mod assist x 4 transfers  Pt educated  about  posture and technique with exercises to improve exercise technique, movement at target joints, with min  verbal, visual, tactile cues.                       PT Education - 05/19/19 1512    Education Details  HEP    [name]  Patient    Methods  Explanation    Comprehension  Verbalized understanding;Verbal cues required;Returned demonstration;Need further instruction       PT Short Term Goals - 04/15/19 0852      PT SHORT TERM GOAL #1   Title  Patient will perform one sit to stand independently from wheelchair to increase independence with transfers    Baseline  6/17: min/mod A    Time  2    Period  Weeks    Status  New    Target Date  04/29/19      PT SHORT TERM GOAL #2   Title  Patient will be independent in  home exercise program to improve strength/mobility for better functional independence with ADLs.    Baseline  HEP to be given next session    Time  2    Status  New    Target Date  04/29/19        PT Long Term Goals - 04/15/19 2725      PT LONG TERM GOAL #1   Title  Patient will perform 5 STS < 40 seconds independently with hemi walker to demonstrate improved home mobility.    Baseline  6/17: unable to perform one time without Min/Mod A    Time  8    Period  Weeks    Status  New    Target Date  06/10/19      PT LONG TERM GOAL #2   Title  Patient will stand without UE support for 3-5 minutes for ADL performance without LOB to increase independence with dressing and toileting.    Baseline  6/17: unable to stand without UE support    Time  8    Period  Weeks    Status  New    Target Date  06/10/19      PT LONG TERM GOAL #3   Title  Patient will ambulate 60 ft with hemi walker and CGA to improve home mobility and independence with gait.     Baseline  6/17: 10 ft    Time  9    Period  Weeks    Target Date  06/10/19      PT LONG TERM GOAL #4   Title  Patient will increase BLE gross strength to 4+/5 as to improve functional strength for independent gait, increased standing tolerance and increased ADL ability    Baseline  6/17: hips grossly 2+/5 knees 3+/5    Time  8    Period  Weeks    Status  New    Target Date  06/10/19            Plan - 05/19/19 1514    Clinical Impression Statement  Patient demonstrates understanding of HEP with min corrections needed. Patient challenged closed chain and open chain exercises with multiple repetitions due to fatigue. Weak LLE combined with weak core musculature results in fair postural control and balance deficits.    Personal Factors and Comorbidities  Behavior Pattern;Comorbidity 3+;Fitness;Past/Current Experience;Social Background;Time since onset of injury/illness/exacerbation;Transportation    Comorbidities  anxiety, collagen  vascular disease, depression, stroke, amputated  digit    Examination-Activity Limitations  Bathing;Bed Mobility;Bend;Caring for Others;Carry;Continence;Dressing;Sleep;Sit;Reach Overhead;Locomotion Level;Lift;Hygiene/Grooming;Squat;Stairs;Stand;Toileting;Transfers    Examination-Participation Restrictions  Church;Cleaning;Community Activity;Driving;Interpersonal Relationship;Meal Prep;Medication Management;Laundry;Shop;Volunteer;Yard Work    Merchant navy officer  Evolving/Moderate complexity    Rehab Potential  Fair    PT Frequency  2x / week    PT Treatment/Interventions  ADLs/Self Care Home Management;Aquatic Therapy;Cryotherapy;Biofeedback;Electrical Stimulation;Iontophoresis 4mg /ml Dexamethasone;Moist Heat;Traction;Ultrasound;Functional mobility training;Stair training;Gait training;DME Instruction;Therapeutic activities;Therapeutic exercise;Balance training;Neuromuscular re-education;Orthotic Fit/Training;Patient/family education;Cognitive remediation;Wheelchair mobility training;Manual techniques;Taping;Splinting;Energy conservation    PT Next Visit Plan  give HEP, practice SPT, ambulation, COM    PT Home Exercise Plan  give next session    Consulted and Agree with Plan of Care  Family member/caregiver    Family Member Consulted  husband       Patient will benefit from skilled therapeutic intervention in order to improve the following deficits and impairments:  Abnormal gait, Decreased activity tolerance, Decreased coordination, Decreased balance, Decreased endurance, Decreased knowledge of precautions, Decreased knowledge of use of DME, Decreased safety awareness, Decreased range of motion, Decreased mobility, Decreased strength, Difficulty walking, Hypomobility, Increased fascial restricitons, Impaired flexibility, Impaired perceived functional ability, Impaired sensation, Impaired tone, Impaired UE functional use, Postural dysfunction, Improper body mechanics, Pain,  Obesity  Visit Diagnosis: 1. Collagen vascular disease ([place])   2. Other abnormalities of gait and mobility   3. Muscle weakness (generalized)   4. Other lack of coordination   5. Gangrene of finger of right hand [facility])        Problem List Patient Active Problem List   Diagnosis Date Noted  . Collagen vascular disease ([place]) 05/21/2017  . Gangrene of finger of right hand ([place]) 05/21/2017    Alanson Puls, [place] DPT 05/19/2019, 3:18 PM  [place] MAIN [facility] SERVICES [address] [place], [place], [address] Phone: [phone]   Fax:  [phone]  Name: Ariel Alvarez MRN: [id number] Date of Birth: July 11, 1946

## 2019-05-21 ENCOUNTER — Encounter: Payer: Self-pay | Admitting: Physical Therapy

## 2019-05-21 ENCOUNTER — Other Ambulatory Visit: Payer: Self-pay

## 2019-05-21 ENCOUNTER — Ambulatory Visit: Payer: Medicare Other | Admitting: Physical Therapy

## 2019-05-21 DIAGNOSIS — M359 Systemic involvement of connective tissue, unspecified: Secondary | ICD-10-CM

## 2019-05-21 DIAGNOSIS — R2689 Other abnormalities of gait and mobility: Secondary | ICD-10-CM

## 2019-05-21 DIAGNOSIS — M6281 Muscle weakness (generalized): Secondary | ICD-10-CM

## 2019-05-21 DIAGNOSIS — R278 Other lack of coordination: Secondary | ICD-10-CM

## 2019-05-21 NOTE — Therapy (Signed)
[place] MAIN [facility] SERVICES [address] [place], [place], [address] Phone: [phone]   Fax:  [phone]  Physical Therapy Treatment Physical Therapy Progress Note   Dates of reporting period  04/15/19   to   05/21/19  Patient Details  Name: Ariel Alvarez MRN: [id number] Date of Birth: [birth year] Referring Provider (PT): Okey Regal   Encounter Date: 05/21/2019  PT End of Session - 05/21/19 1443    Visit Number  10    Number of Visits  16    Date for PT Re-Evaluation  06/10/19    PT Start Time  0235    PT Stop Time  0315    PT Time Calculation (min)  40 min    Equipment Utilized During Treatment  Gait belt    Activity Tolerance  Patient limited by fatigue;Other (comment)   fear of movement   Behavior During Therapy  WFL for tasks assessed/performed;Anxious   tangential nature of conversation, require max encouragement/cueing      Past Medical History:  Diagnosis Date  . Anxiety   . Collagen vascular disease ([place])   . Depression   . Stroke [facility])     Past Surgical History:  Procedure Laterality Date  . CHOLECYSTECTOMY      There were no vitals filed for this visit.  Subjective Assessment - 05/21/19 1441    Subjective  Patinet has no new complaints.    Patient is accompained by:  Family member    Pertinent History  Patient presents for instability s/p CVA in 01/2017. Patient was recently diagnosed with osteoporosis in bilateral legs and feet. Is potentially getting surgery in 2 weeks on both feet. After discharge from outpatient physical therapy had one session with home health PT but no other therapy. Reports no falls. Reports walking from living room to kitchen and back to walkway however her LLE gives out frequently . Per husband and patient she was going to an adult day center prior to [place]. Patient has a flat/level entry. Trying to get a power chair at this time from [employer]. Has a manual chair at  this time, walker, hemi walker, quad cane. Doing bird baths but has a walk in tub she can't get in, does have a shower chair. Patient and husband aware that patient will need caregiver with her due to toileting needs.    Limitations  Sitting;Lifting;Standing;Walking;House hold activities    How long can you sit comfortably?  recliner 5 hours, 30 minute sin wheelchair    How long can you stand comfortably?  4 minutes with UE support    How long can you walk comfortably?  15 ft    Patient Stated Goals  to walk better    Currently in Pain?  Yes    Pain Score  2     Pain Location  Toe (Comment which one)    Pain Orientation  Left    Pain Descriptors / Indicators  Aching    Pain Type  Chronic pain    Pain Onset  More than a month ago    Pain Frequency  Intermittent    Aggravating Factors   unsure    Pain Relieving Factors  stretching    Effect of Pain on Daily Activities  makes it difficult to walk         Treatment; Transfers sit to stand from wc <> hemi walker  Gait training with hemiwalker 18 feet, 12 feet, 22 feet with min assist  Nu-step x 5 mins                        PT Education - 05/21/19 1443    Education Details  HEP    [name]  Patient    Methods  Explanation    Comprehension  Verbalized understanding;Need further instruction       PT Short Term Goals - 05/21/19 1446      PT SHORT TERM GOAL #1   Title  Patient will perform one sit to stand independently from wheelchair to increase independence with transfers    Baseline  6/17: min/mod A    Time  2    Period  Weeks    Status  Partially Met    Target Date  04/29/19      PT SHORT TERM GOAL #2   Title  Patient will be independent in home exercise program to improve strength/mobility for better functional independence with ADLs.    Baseline  HEP to be given next session    Time  2    Status  Partially Met    Target Date  04/29/19        PT Long Term Goals - 05/21/19 1446       PT LONG TERM GOAL #1   Title  Patient will perform 5 STS < 40 seconds independently with hemi walker to demonstrate improved home mobility.    Baseline  6/17: unable to perform one time without Min/Mod A, 05/21/19 1.06 sec    Time  8    Period  Weeks    Status  Partially Met    Target Date  06/10/19      PT LONG TERM GOAL #2   Title  Patient will stand without UE support for 3-5 minutes for ADL performance without LOB to increase independence with dressing and toileting.    Baseline  6/17: unable to stand without UE support, 05/21/19 1 min 30 sec    Time  8    Period  Weeks    Status  New    Target Date  06/10/19      PT LONG TERM GOAL #3   Title  Patient will ambulate 60 ft with hemi walker and CGA to improve home mobility and independence with gait.     Baseline  6/17: 10 ft, 05/21/19 17 feet    Time  9    Period  Weeks    Status  Partially Met    Target Date  06/10/19      PT LONG TERM GOAL #4   Title  Patient will increase BLE gross strength to 4+/5 as to improve functional strength for independent gait, increased standing tolerance and increased ADL ability    Baseline  6/17: hips grossly 2+/5 knees 3+/5    Time  8    Period  Weeks    Status  Partially Met    Target Date  06/10/19            Plan - 05/21/19 1649   py. Maximum improvement is Clinical Impression Statement Patient's condition has the potential to improve in response to therapy yet to be obtained. The anticipated improvement is attainable and reasonable in a generally predictable time.  Patient reports that she is doing her HEP and can stand for longer periods of time. Patient has made progress towards all goals and is able to transfer with SBA from wc to standing with hemi walker Patient has  made progress towards all goals and is able to transfer with SBA from wc to standing with hemi walker.She will continue to benefit from skilled PT to improve mobility and safety.    Personal Factors and Comorbidities   Behavior Pattern;Comorbidity 3+;Fitness;Past/Current Experience;Social Background;Time since onset of injury/illness/exacerbation;Transportation    Comorbidities  anxiety, collagen vascular disease, depression, stroke, amputated digit    Examination-Activity Limitations  Bathing;Bed Mobility;Bend;Caring for Others;Carry;Continence;Dressing;Sleep;Sit;Reach Overhead;Locomotion Level;Lift;Hygiene/Grooming;Squat;Stairs;Stand;Toileting;Transfers    Examination-Participation Restrictions  Church;Cleaning;Community Activity;Driving;Interpersonal Relationship;Meal Prep;Medication Management;Laundry;Shop;Volunteer;Yard Work    Merchant navy officer  Evolving/Moderate complexity    Rehab Potential  Fair    PT Frequency  2x / week    PT Treatment/Interventions  ADLs/Self Care Home Management;Aquatic Therapy;Cryotherapy;Biofeedback;Electrical Stimulation;Iontophoresis 53m/ml Dexamethasone;Moist Heat;Traction;Ultrasound;Functional mobility training;Stair training;Gait training;DME Instruction;Therapeutic activities;Therapeutic exercise;Balance training;Neuromuscular re-education;Orthotic Fit/Training;Patient/family education;Cognitive remediation;Wheelchair mobility training;Manual techniques;Taping;Splinting;Energy conservation    PT Next Visit Plan  give HEP, practice SPT, ambulation, COM    PT Home Exercise Plan  give next session    Consulted and Agree with Plan of Care  Family member/caregiver    Family Member Consulted  husband       Patient will benefit from skilled therapeutic intervention in order to improve the following deficits and impairments:  Abnormal gait, Decreased activity tolerance, Decreased coordination, Decreased balance, Decreased endurance, Decreased knowledge of precautions, Decreased knowledge of use of DME, Decreased safety awareness, Decreased range of motion, Decreased mobility, Decreased strength, Difficulty walking, Hypomobility, Increased fascial restricitons, Impaired  flexibility, Impaired perceived functional ability, Impaired sensation, Impaired tone, Impaired UE functional use, Postural dysfunction, Improper body mechanics, Pain, Obesity  Visit Diagnosis: 1. Coll[place] vascular disease ([place]   2. Other abnormalities of gait and mobility   3. Muscle weakness (generalized)   4. Other lack of coordination        Problem List Patient Active Problem List   Diagnosis Date Noted  . Coll[place] vascular disease ([place] 05/21/2017  . Gangrene of finger of right hand ([place] 05/21/2017    MAlanson Puls PVirginiaDPT 05/21/2019, 4:53 PM  [place] [facility] ServicesSERVICES [address] [place] [address]Phone: [phone]  Fax:  [phone] Name: Ariel DALEOMRN: [id number] of Birth: 117-Jul-1947

## 2019-05-26 ENCOUNTER — Encounter: Payer: Self-pay | Admitting: Occupational Therapy

## 2019-05-26 ENCOUNTER — Ambulatory Visit: Payer: Medicare Other | Admitting: Physical Therapy

## 2019-05-26 ENCOUNTER — Other Ambulatory Visit: Payer: Self-pay

## 2019-05-26 ENCOUNTER — Encounter: Payer: Self-pay | Admitting: Physical Therapy

## 2019-05-26 ENCOUNTER — Ambulatory Visit: Payer: Medicare Other | Admitting: Occupational Therapy

## 2019-05-26 DIAGNOSIS — R278 Other lack of coordination: Secondary | ICD-10-CM

## 2019-05-26 DIAGNOSIS — I96 Gangrene, not elsewhere classified: Secondary | ICD-10-CM

## 2019-05-26 DIAGNOSIS — R2689 Other abnormalities of gait and mobility: Secondary | ICD-10-CM | POA: Diagnosis not present

## 2019-05-26 DIAGNOSIS — M6281 Muscle weakness (generalized): Secondary | ICD-10-CM

## 2019-05-26 DIAGNOSIS — M359 Systemic involvement of connective tissue, unspecified: Secondary | ICD-10-CM

## 2019-05-26 NOTE — Therapy (Signed)
[place] [facility] MAIN [facility] SERVICES [address] [place], [place], [address] Phone: [phone]   Fax:  [phone]  Occupational Therapy Evaluation  Patient Details  Name: Ariel Alvarez MRN: [id number] Date of Birth: [birth year] No data recorded  Encounter Date: 05/26/2019  OT End of Session - 05/26/19 1652    Visit Number  1    Number of Visits  24    Date for OT Re-Evaluation  08/18/19    OT Start Time  1525    OT Stop Time  1620    OT Time Calculation (min)  55 min    Activity Tolerance  Patient tolerated treatment well    Behavior During Therapy  [facility] for tasks assessed/performed;Anxious       Past Medical History:  Diagnosis Date  . Anxiety   . Collagen vascular disease ([facility])   . Depression   . Stroke [facility])     Past Surgical History:  Procedure Laterality Date  . CHOLECYSTECTOMY      There were no vitals filed for this visit.  Subjective Assessment - 05/26/19 1711    Subjective   Pt. reports that her husband works during the day.    Patient is accompanied by:  Family member    Pertinent History  Pt. is a 73 y.o. female who sustained multiple CVAs with left hemiplegia, Pt. PMHx includes: Depression, Collagen Vascular Disease, Anxiety. Pt. recieved therapy services in thre past. Pt. resides at home with her husband who is supportive, and assists with her care. Pt. is alone during the day, as her husband works.    Currently in Pain?  No/denies    Pain Onset  More than a month ago        [facility] OT Assessment - 05/26/19 1432      Assessment   Medical Diagnosis  CVA    Onset Date/Surgical Date  01/27/13   2nd CVA 01/27/2017   Hand Dominance  Right    Prior Therapy  yes      Precautions   Precautions  Fall      Restrictions   Weight Bearing Restrictions  No      Balance Screen   Has the patient fallen in the past 6 months  No    Has the patient had a decrease in activity level because of a fear of falling?   No    Is  the patient reluctant to leave their home because of a fear of falling?   Yes      Home  Environment   Family/patient expects to be discharged to:  Private residence    Living Arrangements  Spouse/significant other    Available Help at Discharge  Family    Type of Home  House    Home Access  Ramped entrance    Home Layout  One level    Bathroom Shower/Tub  Tub/Shower unit   walkin Tub   Bathroom Accessibility  Yes    How accessible  Accessible via wheelchair    Adaptive equipment  Reacher;Sock aid;Long-handled shoe horn;Long-handled sponge    Home Equipment  Bedside commode;Tub bench;Grab bars - toilet;Grab bars - tub/shower;Wheelchair - Engineer, technical sales - power   [employer] walker   Lives With  Spouse      Prior Function   Level of Independence  Independent    Vocation  Retired    [employer]  --   worked in Furniture conservator/restorer, Go on [employer], and  look at flowers, and tomatoes, Cleaning      ADL   Eating/Feeding  Set up   Assist cutting food, and opening packets.   Grooming  Minimal assistance    Upper Body Bathing  Set up   Assist with back   Lower Body Bathing  Maximal assistance    Upper Body Dressing  Moderate assistance   Uable to button a shirt   Lower Body Dressing  Maximal assistance    Toilet Transfer  Minimal assistance    Toileting -  Hygiene  Minimal assistance    Tub/Shower Transfer  Minimal assistance      IADL   Prior Level of Function Shopping  Independent    Shopping  Completely unable to shop    Prior Level of Function Light Housekeeping  Independent    Light Housekeeping  Does personal laundry completely;Performs light daily tasks such as dishwashing, bed making;Needs help with all home maintenance tasks    Prior Level of Function Meal Prep  Independent    Meal Prep  Able to complete simple cold meal and snack prep   Meals on wheels.   Prior Level of Function Curator  Relies on family or  friends for transportation    Prior Level of Function Medication Managment  Independent   Independent   Medication Management  Is responsible for taking medication in correct dosages at correct time   Occassional assistance with handling small pills.   Prior Level of Function Financial Management  Independent   Independent   Financial Management  Dependent      Mobility   Mobility Status  Needs assist      Written Expression   Dominant Hand  Right      Vision - History   Baseline Vision  Wears glasses all the time      Cognition   Overall Cognitive Status  Impaired/Different from baseline    Area of Impairment  Memory    Attention  Sustained   Gets distracted easily.   Memory  Impaired      Sensation   Light Touch  Appears Intact    Proprioception  Appears Intact      Coordination   Gross Motor Movements are Fluid and Coordinated  No    Fine Motor Movements are Fluid and Coordinated  No    Finger Nose Finger Test  --   Impaired   Right 9 Hole Peg Test  1 min. & 1 sec.      AROM   Overall AROM Comments  LUE:No active ROM elicited .RUE: WFL      PROM   Overall PROM Comments  LUE: shoulder flexion 80, abduction 70, ellbow: 0-100, wirist flexion 60, wrist extension 27, Digit, and Thumb MP, PIP, and DIPs: WFL      Strength   Overall Strength Comments  Right shoulder flexion, and abduction 3+/5, elbow flexion , extension, and wrist flexion, extension      Hand Function   Right Hand Grip (lbs)  15#    Right Hand Lateral Pinch  6 lbs                      OT Education - 05/26/19 1652    Education Details  weakness    [name]  Patient;Spouse    Methods  Explanation    Comprehension  Verbalized understanding;Returned demonstration          OT Long  Term Goals - 05/26/19 1723      OT LONG TERM GOAL #1   Title  Pt. will improve RUE strength to be be able to reach into cabinetry to put items away without compensating posteriorly with her  trunk.    Baseline  05/26/2019: Pt. leans posteriorly when reaching with her RUE    Time  12    Period  Weeks    Status  New    Target Date  08/18/19      OT LONG TERM GOAL #2   Title  Pt. will independently complete light meal preparation in standing    Baseline  05/26/2019: Pt. has difficulty    Time  12    Period  Weeks    Status  New    Target Date  08/18/19      OT LONG TERM GOAL #3   Title  Pt. will improve right hand [facility] skills to be able to independently apply hook earrings.    Baseline  05/26/2019: R: 1 min &1 sec. Pt. is unable to place earrings.    Time  12    Period  Weeks    Status  New    Target Date  08/18/19      OT LONG TERM GOAL #4   Title  Pt. will improve right grip strength to be able to hold a can at the can opener    Baseline  05/26/2019: R: 15#. Pt. is unable to hold, and use a can opener    Time  12    Period  Weeks    Status  New    Target Date  08/18/19      OT LONG TERM GOAL #5   Title  Pt. will require supervision to sweep the floor.    Baseline  05/26/2019: Pt. is unable    Time  12    Period  Weeks    Status  New    Target Date  08/18/19            Plan - 05/26/19 1653    Clinical Impression Statement  Pt. is a 73 y.o. female who has had multiple CVAs with the last CVA occurring in 2018. Pt. presents with impaired AROM in the LUE, with flexor tone contributing to tightness in the Left hand resulting in pt. keeping her hand held in a tight fist. No active movement has been elicited. Pt. presents with weakness, and limted standing tolerance which hinders her ability to complete basic ADL care needs, and IADL tasks. Pt. will benefit from OT services to work on imparoving, and maximizingl independence with ADLs, and IADL functioning.    Occupational performance deficits (Please refer to evaluation for details):  ADL's    Body Structure / Function / Physical Skills  ADL;IADL;FMC;ROM;Pain;UE functional use;Strength;Dexterity    Rehab Potential   Good    Clinical Decision Making  Several treatment options, min-mod task modification necessary    OT Frequency  2x / week    OT Duration  12 weeks    OT Treatment/Interventions  Self-care/ADL training;Neuromuscular education;Manual Therapy;Therapeutic activities;Therapeutic exercise;Patient/family education    Consulted and Agree with Plan of Care  Patient       Patient will benefit from skilled therapeutic intervention in order to improve the following deficits and impairments:   Body Structure / Function / Physical Skills: ADL, IADL, FMC, ROM, Pain, UE functional use, Strength, Dexterity       Visit Diagnosis: 1. Muscle weakness (generalized)  2. Other lack of coordination       Problem List Patient Active Problem List   Diagnosis Date Noted  . Collagen vascular disease ([place]) 05/21/2017  . Gangrene of finger of right hand ([place]) 05/21/2017    Harrel Carina, MS, OTR/L 05/26/2019, 5:38 PM  [place] MAIN [facility] SERVICES [address] [place], [place], [address] Phone: [phone]   Fax:  [phone]  Name: JONELLE BANN MRN: [id number] Date of Birth: 20-Jul-1946

## 2019-05-26 NOTE — Therapy (Signed)
[place] MAIN [facility] SERVICES [address] [place], [place], [address] Phone: [phone]   Fax:  [phone]  Physical Therapy Treatment  Patient Details  Name: Ariel Alvarez MRN: [id number] Date of Birth: 04-10-1946 Referring Provider (PT): Okey Regal   Encounter Date: 05/26/2019  PT End of Session - 05/26/19 1356    Visit Number  11    Number of Visits  16    Date for PT Re-Evaluation  06/10/19    Equipment Utilized During Treatment  Gait belt    Activity Tolerance  Patient limited by fatigue;Other (comment)   fear of movement   Behavior During Therapy  WFL for tasks assessed/performed;Anxious   tangential nature of conversation, require max encouragement/cueing      Past Medical History:  Diagnosis Date  . Anxiety   . Collagen vascular disease ([place])   . Depression   . Stroke [facility])     Past Surgical History:  Procedure Laterality Date  . CHOLECYSTECTOMY      There were no vitals filed for this visit.  Subjective Assessment - 05/26/19 1354    Subjective  Patinet has no new complaints. [place] now due to her husbands schedule and want to discuss options.    Patient is accompained by:  Family member    Pertinent History  Patient presents for instability s/p CVA in 01/2017. Patient was recently diagnosed with osteoporosis in bilateral legs and feet. Is potentially getting surgery in 2 weeks on both feet. After discharge from outpatient physical therapy had one session with home health PT but no other therapy. Reports no falls. Reports walking from living room to kitchen and back to walkway however her LLE gives out frequently . Per husband and patient she was going to an adult day center prior to [place]. Patient has a flat/level entry. Trying to get a power chair at this time from [employer]. Has a manual chair at this time, walker, hemi walker, quad cane. Doing bird baths but has a  walk in tub she can't get in, does have a shower chair. Patient and husband aware that patient will need caregiver with her due to toileting needs.    Limitations  Sitting;Lifting;Standing;Walking;House hold activities    How long can you sit comfortably?  recliner 5 hours, 30 minute sin wheelchair    How long can you stand comfortably?  4 minutes with UE support    How long can you walk comfortably?  15 ft    Patient Stated Goals  to walk better    Currently in Pain?  No/denies    Pain Score  0-No pain    Pain Onset  More than a month ago         Therapeutic activiites:  Transfer <> multi level surfaces <> nu-step with CGA assist x 4 transfers   Transfers sit to stand with hemiwalker and CGA assist and Vc for safety and sequencing  PROM to left heel cord and left knee hamstring x 30 sec x 3   Gait training with hemi walker 30 feet x 3 , wc following   Standing and weight shifting left and right x 2 mins x 3 reps    Pt educated  about  posture and technique with exercises to improve exercise technique, movement at target joints, with min  verbal, visual, tactile cues  PT Education - 05/26/19 1355    Education Details  HEP    [name]  Patient    Methods  Explanation    Comprehension  Verbalized understanding       PT Short Term Goals - 05/21/19 1446      PT SHORT TERM GOAL #1   Title  Patient will perform one sit to stand independently from wheelchair to increase independence with transfers    Baseline  6/17: min/mod A    Time  2    Period  Weeks    Status  Partially Met    Target Date  04/29/19      PT SHORT TERM GOAL #2   Title  Patient will be independent in home exercise program to improve strength/mobility for better functional independence with ADLs.    Baseline  HEP to be given next session    Time  2    Status  Partially Met    Target Date  04/29/19        PT Long Term Goals - 05/21/19 1446       PT LONG TERM GOAL #1   Title  Patient will perform 5 STS < 40 seconds independently with hemi walker to demonstrate improved home mobility.    Baseline  6/17: unable to perform one time without Min/Mod A, 05/21/19 1.06 sec    Time  8    Period  Weeks    Status  Partially Met    Target Date  06/10/19      PT LONG TERM GOAL #2   Title  Patient will stand without UE support for 3-5 minutes for ADL performance without LOB to increase independence with dressing and toileting.    Baseline  6/17: unable to stand without UE support, 05/21/19 1 min 30 sec    Time  8    Period  Weeks    Status  New    Target Date  06/10/19      PT LONG TERM GOAL #3   Title  Patient will ambulate 60 ft with hemi walker and CGA to improve home mobility and independence with gait.     Baseline  6/17: 10 ft, 05/21/19 17 feet    Time  9    Period  Weeks    Status  Partially Met    Target Date  06/10/19      PT LONG TERM GOAL #4   Title  Patient will increase BLE gross strength to 4+/5 as to improve functional strength for independent gait, increased standing tolerance and increased ADL ability    Baseline  6/17: hips grossly 2+/5 knees 3+/5    Time  8    Period  Weeks    Status  Partially Met    Target Date  06/10/19            Plan - 05/26/19 1518    Clinical Impression Statement  Pt requires direction and verbal cues for correct performance of exercises. Patient needs cGA assist for transfers wc <> mat, wc<> nu-step.  Patient demonstrates weakness in LLE and performs open chain exercises with no reports of pain. Pt was able to perform all exercises with min assist and CGA for gait with VC for technique  Pt encouraged continuing HEP .Follow-up as scheduled.    Personal Factors and Comorbidities  Behavior Pattern;Comorbidity 3+;Fitness;Past/Current Experience;Social Background;Time since onset of injury/illness/exacerbation;Transportation    Comorbidities  anxiety, collagen vascular disease, depression,  stroke, amputated digit  Examination-Activity Limitations  Bathing;Bed Mobility;Bend;Caring for Others;Carry;Continence;Dressing;Sleep;Sit;Reach Overhead;Locomotion Level;Lift;Hygiene/Grooming;Squat;Stairs;Stand;Toileting;Transfers    Examination-Participation Restrictions  Church;Cleaning;Community Activity;Driving;Interpersonal Relationship;Meal Prep;Medication Management;Laundry;Shop;Volunteer;Yard Work    [employer] officer  Evolving/Moderate complexity    Rehab Potential  Fair    PT Frequency  2x / week    PT Treatment/Interventions  ADLs/Self Care Home Management;Aquatic Therapy;Cryotherapy;Biofeedback;Electrical Stimulation;Iontophoresis 57m/ml Dexamethasone;Moist Heat;Traction;Ultrasound;Functional mobility training;Stair training;Gait training;DME Instruction;Therapeutic activities;Therapeutic exercise;Balance training;Neuromuscular re-education;Orthotic Fit/Training;Patient/family education;Cognitive remediation;Wheelchair mobility training;Manual techniques;Taping;Splinting;Energy conservation    PT Next Visit Plan  give HEP, practice SPT, ambulation, COM    PT Home Exercise Plan  give next session    Consulted and Agree with Plan of Care  Family member/caregiver    Family Member Consulted  husband       Patient will benefit from skilled therapeutic intervention in order to improve the following deficits and impairments:  Abnormal gait, Decreased activity tolerance, Decreased coordination, Decreased balance, Decreased endurance, Decreased knowledge of precautions, Decreased knowledge of use of DME, Decreased safety awareness, Decreased range of motion, Decreased mobility, Decreased strength, Difficulty walking, Hypomobility, Increased fascial restricitons, Impaired flexibility, Impaired perceived functional ability, Impaired sensation, Impaired tone, Impaired UE functional use, Postural dysfunction, Improper body mechanics, Pain, Obesity  Visit Diagnosis: 1. Collagen  vascular disease ([place]   2. Other abnormalities of gait and mobility   3. Muscle weakness (generalized)   4. Other lack of coordination   5. Gangrene of finger of right hand ([facility]        Problem List Patient Active Problem List   Diagnosis Date Noted  . [place] vascular disease ([place] 05/21/2017  . Gangrene of finger of right hand ([place] 05/21/2017    MAlanson Puls PVirginiaDPT 05/26/2019, 3:22 PM  [place]MAIN [facility]SERV[facility] [address] [address]Phone: [phone]  Fax:  [phone] Name: Ariel SPETHMRN: [id number] of Birth: [birth year]

## 2019-05-28 ENCOUNTER — Ambulatory Visit: Payer: Medicare Other | Admitting: Physical Therapy

## 2019-05-28 ENCOUNTER — Ambulatory Visit: Payer: Medicare Other | Admitting: Occupational Therapy

## 2019-06-01 ENCOUNTER — Encounter: Payer: Self-pay | Admitting: Physical Therapy

## 2019-06-01 ENCOUNTER — Other Ambulatory Visit: Payer: Self-pay

## 2019-06-01 ENCOUNTER — Ambulatory Visit: Payer: Medicare Other | Attending: Internal Medicine | Admitting: Physical Therapy

## 2019-06-01 DIAGNOSIS — R278 Other lack of coordination: Secondary | ICD-10-CM | POA: Insufficient documentation

## 2019-06-01 DIAGNOSIS — R2689 Other abnormalities of gait and mobility: Secondary | ICD-10-CM

## 2019-06-01 DIAGNOSIS — M6281 Muscle weakness (generalized): Secondary | ICD-10-CM

## 2019-06-01 DIAGNOSIS — M359 Systemic involvement of connective tissue, unspecified: Secondary | ICD-10-CM

## 2019-06-01 DIAGNOSIS — I96 Gangrene, not elsewhere classified: Secondary | ICD-10-CM | POA: Insufficient documentation

## 2019-06-01 NOTE — Therapy (Signed)
[place] MAIN [facility] SERVICES [address] [place], [place], [address] Phone: [phone]   Fax:  [phone]  Physical Therapy Treatment  Patient Details  Name: Ariel Alvarez MRN: [id number] Date of Birth: [birth year] Referring Provider (PT): Okey Regal   Encounter Date: 06/01/2019  PT End of Session - 06/01/19 0858    Visit Number  12    Number of Visits  16    Date for PT Re-Evaluation  06/10/19    PT Start Time  0800    PT Stop Time  0845    PT Time Calculation (min)  45 min    Equipment Utilized During Treatment  Gait belt    Activity Tolerance  Patient limited by fatigue;Other (comment)   fear of movement   Behavior During Therapy  WFL for tasks assessed/performed;Anxious   tangential nature of conversation, require max encouragement/cueing      Past Medical History:  Diagnosis Date  . Anxiety   . Collagen vascular disease ([place])   . Depression   . Stroke [facility])     Past Surgical History:  Procedure Laterality Date  . CHOLECYSTECTOMY      There were no vitals filed for this visit.  Subjective Assessment - 06/01/19 0856    Subjective  Patinet has no new complaints. She is doing well today.    Patient is accompained by:  Family member    Pertinent History  Patient presents for instability s/p CVA in 01/2017. Patient was recently diagnosed with osteoporosis in bilateral legs and feet. Is potentially getting surgery in 2 weeks on both feet. After discharge from outpatient physical therapy had one session with home health PT but no other therapy. Reports no falls. Reports walking from living room to kitchen and back to walkway however her LLE gives out frequently . Per husband and patient she was going to an adult day center prior to [place]. Patient has a flat/level entry. Trying to get a power chair at this time from [employer]. Has a manual chair at this time, walker, hemi walker, quad cane. Doing bird baths  but has a walk in tub she can't get in, does have a shower chair. Patient and husband aware that patient will need caregiver with her due to toileting needs.    Limitations  Sitting;Lifting;Standing;Walking;House hold activities    How long can you sit comfortably?  recliner 5 hours, 30 minute sin wheelchair    How long can you stand comfortably?  4 minutes with UE support    How long can you walk comfortably?  15 ft    Patient Stated Goals  to walk better    Currently in Pain?  No/denies    Pain Score  0-No pain    Pain Onset  More than a month ago        Therapeutic activiites:  Transfer <>multi level surfaces <>nu-step with CGA assist x 4 transfers   Transfers sit to stand with hemiwalker and CGA assist and Vc for safety and sequencing  PROM to left heel cord and left knee hamstring x 30 sec x 3   LAQ BLE x 10 x 2   Hip flex x 10 x 2   Gait training with hemi walker 30 feet x 3 , wc following   Standing and weight shifting left and right x 2 mins x 3 reps   Pt educated about posture and technique with exercises to improve exercise technique, movement at target joints, with  min verbal, visual, tactile cues                        PT Education - 06/01/19 0857    Education Details  HEP    [name]  Patient    Methods  Explanation    Comprehension  Verbalized understanding       PT Short Term Goals - 05/21/19 1446      PT SHORT TERM GOAL #1   Title  Patient will perform one sit to stand independently from wheelchair to increase independence with transfers    Baseline  6/17: min/mod A    Time  2    Period  Weeks    Status  Partially Met    Target Date  04/29/19      PT SHORT TERM GOAL #2   Title  Patient will be independent in home exercise program to improve strength/mobility for better functional independence with ADLs.    Baseline  HEP to be given next session    Time  2    Status  Partially Met    Target Date   04/29/19        PT Long Term Goals - 05/21/19 1446      PT LONG TERM GOAL #1   Title  Patient will perform 5 STS < 40 seconds independently with hemi walker to demonstrate improved home mobility.    Baseline  6/17: unable to perform one time without Min/Mod A, 05/21/19 1.06 sec    Time  8    Period  Weeks    Status  Partially Met    Target Date  06/10/19      PT LONG TERM GOAL #2   Title  Patient will stand without UE support for 3-5 minutes for ADL performance without LOB to increase independence with dressing and toileting.    Baseline  6/17: unable to stand without UE support, 05/21/19 1 min 30 sec    Time  8    Period  Weeks    Status  New    Target Date  06/10/19      PT LONG TERM GOAL #3   Title  Patient will ambulate 60 ft with hemi walker and CGA to improve home mobility and independence with gait.     Baseline  6/17: 10 ft, 05/21/19 17 feet    Time  9    Period  Weeks    Status  Partially Met    Target Date  06/10/19      PT LONG TERM GOAL #4   Title  Patient will increase BLE gross strength to 4+/5 as to improve functional strength for independent gait, increased standing tolerance and increased ADL ability    Baseline  6/17: hips grossly 2+/5 knees 3+/5    Time  8    Period  Weeks    Status  Partially Met    Target Date  06/10/19            Plan - 06/01/19 0859    Clinical Impression Statement  Pt requires direction and verbal cues for correct performance of transfers and gait.  Patient needs CGA assist for transfers wc <> mat, wc<> nu-step.  Patient demonstrates weakness in BLE and performs open chain and PROM to L ankle and heel cord with no reports of increased pain. Pt was able to perform all exercises with min assist and CGA for gait with VC for technique  Pt encouraged  continuing HEP .Follow-up as scheduled.    Personal Factors and Comorbidities  Behavior Pattern;Comorbidity 3+;Fitness;Past/Current Experience;Social Background;Time since onset of  injury/illness/exacerbation;Transportation    Comorbidities  anxiety, collagen vascular disease, depression, stroke, amputated digit    Examination-Activity Limitations  Bathing;Bed Mobility;Bend;Caring for Others;Carry;Continence;Dressing;Sleep;Sit;Reach Overhead;Locomotion Level;Lift;Hygiene/Grooming;Squat;Stairs;Stand;Toileting;Transfers    Examination-Participation Restrictions  Church;Cleaning;Community Activity;Driving;Interpersonal Relationship;Meal Prep;Medication Management;Laundry;Shop;Volunteer;Yard Work    [employer]chant [employer] officer  Evolving/Moderate complexity    Rehab Potential  Fair    PT Frequency  2x / week    PT Treatment/Interventions  ADLs/Self Care Home Management;Aquatic Therapy;Cryotherapy;Biofeedback;Electrical Stimulation;Iontophoresis 40m/ml Dexamethasone;Moist Heat;Traction;Ultrasound;Functional mobility training;Stair training;Gait training;DME Instruction;Therapeutic activities;Therapeutic exercise;Balance training;Neuromuscular re-education;Orthotic Fit/Training;Patient/family education;Cognitive remediation;Wheelchair mobility training;Manual techniques;Taping;Splinting;Energy conservation    PT Next Visit Plan  give HEP, practice SPT, ambulation, COM    PT Home Exercise Plan  give next session    Consulted and Agree with Plan of Care  Family member/caregiver    Family Member Consulted  husband       Patient will benefit from skilled therapeutic intervention in order to improve the following deficits and impairments:  Abnormal gait, Decreased activity tolerance, Decreased coordination, Decreased balance, Decreased endurance, Decreased knowledge of precautions, Decreased knowledge of use of DME, Decreased safety awareness, Decreased range of motion, Decreased mobility, Decreased strength, Difficulty walking, Hypomobility, Increased fascial restricitons, Impaired flexibility, Impaired perceived functional ability, Impaired sensation, Impaired tone, Impaired UE  functional use, Postural dysfunction, Improper body mechanics, Pain, Obesity  Visit Diagnosis: 1. Muscle weakness (generalized)   2. Other lack of coordination   3. Collagen vascular disease ([place]   4. Other abnormalities of gait and mobility   5. Gangrene of finger of right hand ([facility]        Problem List Patient Active Problem List   Diagnosis Date Noted  . [place]agen vascular disease ([place] 05/21/2017  . Gangrene of finger of right hand ([place] 05/21/2017    MAlanson Puls PT DPT 06/01/2019, 9:02 AM  [place]unting[place]MAIN RNorth[facility] LLCSERVICES [address] [address]Phone: [phone]  Fax:  [phone] Name: SMYLINDA BROOKMRN: [id number] of Birth: [birth year]

## 2019-06-02 ENCOUNTER — Encounter: Payer: Medicare Other | Admitting: Occupational Therapy

## 2019-06-02 ENCOUNTER — Ambulatory Visit: Payer: Medicare Other | Admitting: Physical Therapy

## 2019-06-03 ENCOUNTER — Ambulatory Visit: Payer: Medicare Other | Admitting: Physical Therapy

## 2019-06-03 ENCOUNTER — Other Ambulatory Visit: Payer: Self-pay

## 2019-06-03 ENCOUNTER — Encounter: Payer: Self-pay | Admitting: Physical Therapy

## 2019-06-03 DIAGNOSIS — M6281 Muscle weakness (generalized): Secondary | ICD-10-CM | POA: Diagnosis not present

## 2019-06-03 DIAGNOSIS — M359 Systemic involvement of connective tissue, unspecified: Secondary | ICD-10-CM

## 2019-06-03 DIAGNOSIS — I96 Gangrene, not elsewhere classified: Secondary | ICD-10-CM

## 2019-06-03 DIAGNOSIS — R2689 Other abnormalities of gait and mobility: Secondary | ICD-10-CM

## 2019-06-03 DIAGNOSIS — R278 Other lack of coordination: Secondary | ICD-10-CM

## 2019-06-03 NOTE — Therapy (Signed)
[place] MAIN [facility] SERVICES [address] [place], [place], [address] Phone: [phone]   Fax:  [phone]  Physical Therapy Treatment  Patient Details  Name: Ariel Alvarez MRN: [id number] Date of Birth: [birth year] Referring Provider (PT): Okey Regal   Encounter Date: 06/03/2019  PT End of Session - 06/03/19 0759    Visit Number  13    Number of Visits  16    Date for PT Re-Evaluation  06/10/19    PT Start Time  0800    PT Stop Time  0845    PT Time Calculation (min)  45 min    Equipment Utilized During Treatment  Gait belt    Activity Tolerance  Patient limited by fatigue;Other (comment)   fear of movement   Behavior During Therapy  WFL for tasks assessed/performed;Anxious   tangential nature of conversation, require max encouragement/cueing      Past Medical History:  Diagnosis Date  . Anxiety   . Collagen vascular disease ([place])   . Depression   . Stroke [facility])     Past Surgical History:  Procedure Laterality Date  . CHOLECYSTECTOMY      There were no vitals filed for this visit.  Subjective Assessment - 06/03/19 0905    Subjective  Patinet has no new complaints. She is doing well today.    Patient is accompained by:  Family member    Pertinent History  Patient presents for instability s/p CVA in 01/2017. Patient was recently diagnosed with osteoporosis in bilateral legs and feet. Is potentially getting surgery in 2 weeks on both feet. After discharge from outpatient physical therapy had one session with home health PT but no other therapy. Reports no falls. Reports walking from living room to kitchen and back to walkway however her LLE gives out frequently . Per husband and patient she was going to an adult day center prior to [place]. Patient has a flat/level entry. Trying to get a power chair at this time from [employer]. Has a manual chair at this time, walker, hemi walker, quad cane. Doing bird baths  but has a walk in tub she can't get in, does have a shower chair. Patient and husband aware that patient will need caregiver with her due to toileting needs.    Limitations  Sitting;Lifting;Standing;Walking;House hold activities    How long can you sit comfortably?  recliner 5 hours, 30 minute sin wheelchair    How long can you stand comfortably?  4 minutes with UE support    How long can you walk comfortably?  15 ft    Patient Stated Goals  to walk better    Currently in Pain?  No/denies    Pain Score  0-No pain    Pain Onset  More than a month ago       Treatment  Transfer <>multi level surfaces <>nu-step withCGAassist x 4 transfers   Transfers sit to stand with hemiwalker andCGAassist and Vc for safety and sequencing  PROM to left heel cord and left knee hamstring x 30 sec x 3   LAQ BLE x 10 x 2   Hip flex x 10 x 2   Gait training with hemi walker 35 feet x 4, wc following  Standing and weight shifting left and right x 2 mins x 3 reps   Pt educated about posture and technique with exercises to improve exercise technique, movement at target joints, with min verbal, visual, tactile cues  PT Education - 06/03/19 0905    Education Details  HEP    [name]  Patient    Methods  Explanation    Comprehension  Verbalized understanding;Verbal cues required       PT Short Term Goals - 05/21/19 1446      PT SHORT TERM GOAL #1   Title  Patient will perform one sit to stand independently from wheelchair to increase independence with transfers    Baseline  6/17: min/mod A    Time  2    Period  Weeks    Status  Partially Met    Target Date  04/29/19      PT SHORT TERM GOAL #2   Title  Patient will be independent in home exercise program to improve strength/mobility for better functional independence with ADLs.    Baseline  HEP to be given next session    Time  2    Status  Partially Met    Target Date   04/29/19        PT Long Term Goals - 05/21/19 1446      PT LONG TERM GOAL #1   Title  Patient will perform 5 STS < 40 seconds independently with hemi walker to demonstrate improved home mobility.    Baseline  6/17: unable to perform one time without Min/Mod A, 05/21/19 1.06 sec    Time  8    Period  Weeks    Status  Partially Met    Target Date  06/10/19      PT LONG TERM GOAL #2   Title  Patient will stand without UE support for 3-5 minutes for ADL performance without LOB to increase independence with dressing and toileting.    Baseline  6/17: unable to stand without UE support, 05/21/19 1 min 30 sec    Time  8    Period  Weeks    Status  New    Target Date  06/10/19      PT LONG TERM GOAL #3   Title  Patient will ambulate 60 ft with hemi walker and CGA to improve home mobility and independence with gait.     Baseline  6/17: 10 ft, 05/21/19 17 feet    Time  9    Period  Weeks    Status  Partially Met    Target Date  06/10/19      PT LONG TERM GOAL #4   Title  Patient will increase BLE gross strength to 4+/5 as to improve functional strength for independent gait, increased standing tolerance and increased ADL ability    Baseline  6/17: hips grossly 2+/5 knees 3+/5    Time  8    Period  Weeks    Status  Partially Met    Target Date  06/10/19            Plan - 06/03/19 0906    Clinical Impression Statement  Pt requires direction and verbal cues for correct performance of gait and strengthening exercises. Patient has fatigue with endurance and difficulty with left knee and ankle, controlling extension in stance phase of gait.  Patient struggles with gait instability on firm surfaces  due to weakness and fatigue. Pt encouraged continuing HEP   Patient will benefit from continued skilled PT to improve mobility and safety.    Personal Factors and Comorbidities  Behavior Pattern;Comorbidity 3+;Fitness;Past/Current Experience;Social Background;Time since onset of  injury/illness/exacerbation;Transportation    Comorbidities  anxiety, collagen vascular disease, depression, stroke, amputated  digit    Examination-Activity Limitations  Bathing;Bed Mobility;Bend;Caring for Others;Carry;Continence;Dressing;Sleep;Sit;Reach Overhead;Locomotion Level;Lift;Hygiene/Grooming;Squat;Stairs;Stand;Toileting;Transfers    Examination-Participation Restrictions  Church;Cleaning;Community Activity;Driving;Interpersonal Relationship;Meal Prep;Medication Management;Laundry;Shop;Volunteer;Yard Work    [employer] officer  Evolving/Moderate complexity    Rehab Potential  Fair    PT Frequency  2x / week    PT Treatment/Interventions  ADLs/Self Care Home Management;Aquatic Therapy;Cryotherapy;Biofeedback;Electrical Stimulation;Iontophoresis 97m/ml Dexamethasone;Moist Heat;Traction;Ultrasound;Functional mobility training;Stair training;Gait training;DME Instruction;Therapeutic activities;Therapeutic exercise;Balance training;Neuromuscular re-education;Orthotic Fit/Training;Patient/family education;Cognitive remediation;Wheelchair mobility training;Manual techniques;Taping;Splinting;Energy conservation    PT Next Visit Plan  give HEP, practice SPT, ambulation, COM    PT Home Exercise Plan  give next session    Consulted and Agree with Plan of Care  Family member/caregiver    Family Member Consulted  husband       Patient will benefit from skilled therapeutic intervention in order to improve the following deficits and impairments:  Abnormal gait, Decreased activity tolerance, Decreased coordination, Decreased balance, Decreased endurance, Decreased knowledge of precautions, Decreased knowledge of use of DME, Decreased safety awareness, Decreased range of motion, Decreased mobility, Decreased strength, Difficulty walking, Hypomobility, Increased fascial restricitons, Impaired flexibility, Impaired perceived functional ability, Impaired sensation, Impaired tone, Impaired UE  functional use, Postural dysfunction, Improper body mechanics, Pain, Obesity  Visit Diagnosis: 1. Muscle weakness (generalized)   2. Other lack of coordination   3. Collagen vascular disease ([place]   4. Other abnormalities of gait and mobility   5. Gangrene of finger of right hand ([facility]        Problem List Patient Active Problem List   Diagnosis Date Noted  . [place] vascular disease ([place] 05/21/2017  . Gangrene of finger of right hand ([place] 05/21/2017    MAlanson Puls PT DPT 06/03/2019, 9:09 AM  [place] [facility] [address] [address]Phone: [phone]  Fax:  [phone] Name: Ariel TAMMMRN: [id number] of Birth: 106/18/1947

## 2019-06-04 ENCOUNTER — Ambulatory Visit: Payer: Medicare Other | Admitting: Physical Therapy

## 2019-06-04 ENCOUNTER — Encounter: Payer: Medicare Other | Admitting: Occupational Therapy

## 2019-06-08 ENCOUNTER — Encounter: Payer: Self-pay | Admitting: Physical Therapy

## 2019-06-08 ENCOUNTER — Other Ambulatory Visit: Payer: Self-pay

## 2019-06-08 ENCOUNTER — Ambulatory Visit: Payer: Medicare Other | Admitting: Physical Therapy

## 2019-06-08 ENCOUNTER — Encounter: Payer: Self-pay | Admitting: Occupational Therapy

## 2019-06-08 ENCOUNTER — Ambulatory Visit: Payer: Medicare Other | Admitting: Occupational Therapy

## 2019-06-08 DIAGNOSIS — M6281 Muscle weakness (generalized): Secondary | ICD-10-CM

## 2019-06-08 DIAGNOSIS — R278 Other lack of coordination: Secondary | ICD-10-CM

## 2019-06-08 DIAGNOSIS — R2689 Other abnormalities of gait and mobility: Secondary | ICD-10-CM

## 2019-06-08 DIAGNOSIS — M359 Systemic involvement of connective tissue, unspecified: Secondary | ICD-10-CM

## 2019-06-08 NOTE — Therapy (Signed)
[place] MAIN [facility] SERVICES [address] [place], [place], [address] Phone: [phone]   Fax:  [phone]  Occupational Therapy Treatment  Patient Details  Name: Ariel Alvarez MRN: [id number] Date of Birth: 1946/09/05 No data recorded  Encounter Date: 06/08/2019  OT End of Session - 06/08/19 1052    Visit Number  2    Number of Visits  24    Date for OT Re-Evaluation  08/18/19    OT Start Time  0845    OT Stop Time  0930    OT Time Calculation (min)  45 min    Equipment Utilized During Treatment  adaptive writing aids, dycem    Activity Tolerance  Patient tolerated treatment well    Behavior During Therapy  [facility] for tasks assessed/performed;Anxious       Past Medical History:  Diagnosis Date  . Anxiety   . Collagen vascular disease ([place])   . Depression   . Stroke [facility])     Past Surgical History:  Procedure Laterality Date  . CHOLECYSTECTOMY      There were no vitals filed for this visit.  Subjective Assessment - 06/08/19 1050    Subjective   Pt reports she is feeling pretty good today.    Patient is accompanied by:  Family member    Pertinent History  Pt. is a 73 y.o. female who sustained multiple CVAs with left hemiplegia, Pt. PMHx includes: Depression, Collagen Vascular Disease, Anxiety. Pt. recieved therapy services in thre past. Pt. resides at home with her husband who is supportive, and assists with her care. Pt. is alone during the day, as her husband works.    Currently in Pain?  No/denies       Self care skills:  Patient practiced using dycem for use at home for meal preparation skills to help stabilize bowls when stirring or making a salad since her LUE is not able to stabilize.  She practiced using R hand with a variety of pens and given adaptive equipment catalog to order from if she wants to purchase any.   She liked the pen gripper with indentation for finger and thumb and the loop pen holder with increased  legibility with writing and legibility from 25% to 75%.   Neuromuscular re-ed:  Patient continues to have tight flexor tone in LUE and hand with good response to stretching and plans to have finger nails trimmed since they are long and starting to dig into palm of hand. Patient educated in self ROM using tabletop.  No active movement except upper trapezius for shoulder elevation.                     OT Education - 06/08/19 1051    Education Details  Adaptative aids for writing and meal preparation.    [name]  Patient    Methods  Explanation;Handout    Comprehension  Verbalized understanding;Returned demonstration          OT Long Term Goals - 05/26/19 1723      OT LONG TERM GOAL #1   Title  Pt. will improve RUE strength to be be able to reach into cabinetry to put items away without compensating posteriorly with her trunk.    Baseline  05/26/2019: Pt. leans posteriorly when reaching with her RUE    Time  12    Period  Weeks    Status  New    Target Date  08/18/19  OT LONG TERM GOAL #2   Title  Pt. will independently complete light meal preparation in standing    Baseline  05/26/2019: Pt. has difficulty    Time  12    Period  Weeks    Status  New    Target Date  08/18/19      OT LONG TERM GOAL #3   Title  Pt. will improve right hand [facility] skills to be able to independently apply hook earrings.    Baseline  05/26/2019: R: 1 min &1 sec. Pt. is unable to place earrings.    Time  12    Period  Weeks    Status  New    Target Date  08/18/19      OT LONG TERM GOAL #4   Title  Pt. will improve right grip strength to be able to hold a can at the can opener    Baseline  05/26/2019: R: 15#. Pt. is unable to hold, and use a can opener    Time  12    Period  Weeks    Status  New    Target Date  08/18/19      OT LONG TERM GOAL #5   Title  Pt. will require supervision to sweep the floor.    Baseline  05/26/2019: Pt. is unable    Time  12    Period   Weeks    Status  New    Target Date  08/18/19            Plan - 06/08/19 1053    Clinical Impression Statement  Pt. is motivated to regain functinal use of LUE and hand.  She presents with impaired AROM in the LUE, with flexor tone contributing to tightness in the Left hand resulting in pt. keeping her hand held in a tight fist. No active movement has been elicited. Pt educated is adaptive writing aids to help R hand due to arthritis and educated in rec for rocker knife, given a piece of dycem to help stabilize bowls at home to help with meal preparation and self feeding.  Pt. presents with weakness, and limted standing tolerance which hinders her ability to complete basic ADL care needs, and IADL tasks. Pt. will benefit from OT services to work on imparoving, and maximizingl independence with ADLs, and IADL functioning.    OT Occupational Profile and History  Problem Focused Assessment - Including review of records relating to presenting problem    Occupational performance deficits (Please refer to evaluation for details):  ADL's;IADL's    Body Structure / Function / Physical Skills  ADL;IADL;FMC;ROM;Pain;UE functional use;Strength;Dexterity    Rehab Potential  Good    Clinical Decision Making  Several treatment options, min-mod task modification necessary    OT Frequency  2x / week    OT Duration  12 weeks    OT Treatment/Interventions  Self-care/ADL training;Neuromuscular education;Manual Therapy;Therapeutic activities;Therapeutic exercise;Patient/family education    OT Home Exercise Plan  Self ROM exercises tabletop    Consulted and Agree with Plan of Care  Patient       Patient will benefit from skilled therapeutic intervention in order to improve the following deficits and impairments:   Body Structure / Function / Physical Skills: ADL, IADL, FMC, ROM, Pain, UE functional use, Strength, Dexterity       Visit Diagnosis: 1. Muscle weakness (generalized)   2. Other lack of  coordination       Problem List Patient Active Problem List  Diagnosis Date Noted  . Collagen vascular disease ([facility]) 05/21/2017  . Gangrene of finger of right hand [facility]) 05/21/2017    Susanne Borders, OTR/L, [facility] ascom [phone] 06/08/19, 11:05 AM  [facility] MAIN [facility] SERVICES [address] [place], [place], [address] Phone: [phone]   Fax:  [phone]  Name: AHMIYA ABEE MRN: [id number] Date of Birth: [birth year]

## 2019-06-08 NOTE — Therapy (Signed)
[place] MAIN [facility] SERVICES [address] [place], [place], [address] Phone: [phone]   Fax:  [phone]  Physical Therapy Treatment  Patient Details  Name: Ariel Alvarez MRN: [id number] Date of Birth: 07/14/1946 Referring Provider (PT): Okey Regal   Encounter Date: 06/08/2019  PT End of Session - 06/08/19 1040    Visit Number  14    Number of Visits  16    Date for PT Re-Evaluation  06/10/19    PT Start Time  0800    PT Stop Time  0845    PT Time Calculation (min)  45 min    Equipment Utilized During Treatment  Gait belt    Activity Tolerance  Patient limited by fatigue;Other (comment)   fear of movement   Behavior During Therapy  WFL for tasks assessed/performed;Anxious   tangential nature of conversation, require max encouragement/cueing      Past Medical History:  Diagnosis Date  . Anxiety   . Collagen vascular disease ([place])   . Depression   . Stroke [facility])     Past Surgical History:  Procedure Laterality Date  . CHOLECYSTECTOMY      There were no vitals filed for this visit.  Subjective Assessment - 06/08/19 0808    Subjective  Patinet has no new complaints. She is doing well today.Her left foot and leg is hurting.    Patient is accompained by:  Family member    Pertinent History  Patient presents for instability s/p CVA in 01/2017. Patient was recently diagnosed with osteoporosis in bilateral legs and feet. Is potentially getting surgery in 2 weeks on both feet. After discharge from outpatient physical therapy had one session with home health PT but no other therapy. Reports no falls. Reports walking from living room to kitchen and back to walkway however her LLE gives out frequently . Per husband and patient she was going to an adult day center prior to [place]. Patient has a flat/level entry. Trying to get a power chair at this time from [employer]. Has a manual chair at this time, walker, hemi  walker, quad cane. Doing bird baths but has a walk in tub she can't get in, does have a shower chair. Patient and husband aware that patient will need caregiver with her due to toileting needs.    Limitations  Sitting;Lifting;Standing;Walking;House hold activities    How long can you sit comfortably?  recliner 5 hours, 30 minute sin wheelchair    How long can you stand comfortably?  4 minutes with UE support    How long can you walk comfortably?  15 ft    Patient Stated Goals  to walk better    Currently in Pain?  Yes    Pain Score  3     Pain Location  Foot    Pain Orientation  Left    Pain Descriptors / Indicators  Aching    Pain Type  Chronic pain    Pain Onset  More than a month ago    Pain Frequency  Intermittent    Aggravating Factors   not sure    Pain Relieving Factors  stretching    Effect of Pain on Daily Activities  makes it hard to walk    Multiple Pain Sites  No         Treatment  Transfer <>multi level surfaces <>nu-step withCGAassist x 4 transfers   Transfers sit to stand x 5 with hemiwalker andCGAassist and Vc for  safety and sequencing  PROM to left heel cord and left knee hamstring x 30 sec x 3  LAQ BLE x 10 x 2   Hip flex x 10 x 2  Gait training with hemi walker 40 feet x 5, wc following, CGA and VC for step length and posture  Standing and weight shifting left and right x 2 mins x 3 reps   Pt educated about posture and technique with exercises to improve exercise technique, movement at target joints, with min verbal, visual, tactile cues                       PT Education - 06/08/19 1043    Education Details  HEP    [name]  Patient    Methods  Explanation    Comprehension  Verbalized understanding       PT Short Term Goals - 05/21/19 1446      PT SHORT TERM GOAL #1   Title  Patient will perform one sit to stand independently from wheelchair to increase independence with transfers     Baseline  6/17: min/mod A    Time  2    Period  Weeks    Status  Partially Met    Target Date  04/29/19      PT SHORT TERM GOAL #2   Title  Patient will be independent in home exercise program to improve strength/mobility for better functional independence with ADLs.    Baseline  HEP to be given next session    Time  2    Status  Partially Met    Target Date  04/29/19        PT Long Term Goals - 05/21/19 1446      PT LONG TERM GOAL #1   Title  Patient will perform 5 STS < 40 seconds independently with hemi walker to demonstrate improved home mobility.    Baseline  6/17: unable to perform one time without Min/Mod A, 05/21/19 1.06 sec    Time  8    Period  Weeks    Status  Partially Met    Target Date  06/10/19      PT LONG TERM GOAL #2   Title  Patient will stand without UE support for 3-5 minutes for ADL performance without LOB to increase independence with dressing and toileting.    Baseline  6/17: unable to stand without UE support, 05/21/19 1 min 30 sec    Time  8    Period  Weeks    Status  New    Target Date  06/10/19      PT LONG TERM GOAL #3   Title  Patient will ambulate 60 ft with hemi walker and CGA to improve home mobility and independence with gait.     Baseline  6/17: 10 ft, 05/21/19 17 feet    Time  9    Period  Weeks    Status  Partially Met    Target Date  06/10/19      PT LONG TERM GOAL #4   Title  Patient will increase BLE gross strength to 4+/5 as to improve functional strength for independent gait, increased standing tolerance and increased ADL ability    Baseline  6/17: hips grossly 2+/5 knees 3+/5    Time  8    Period  Weeks    Status  Partially Met    Target Date  06/10/19  Plan - 06/08/19 1043    Clinical Impression Statement  Pt presents with flexed posture and decreased standing tolerance and LE weakness and requires frequent verbal cues for upright posture and education provided on relationship between posture and foot step  pattern. Patient demonstrates instability with therapeutic exercises and balance exercises but tolerates all interventions well. Patient will benefit from continued skilled PT interventions for improved balance, posture, strength, and QOL.    Personal Factors and Comorbidities  Behavior Pattern;Comorbidity 3+;Fitness;Past/Current Experience;Social Background;Time since onset of injury/illness/exacerbation;Transportation    Comorbidities  anxiety, collagen vascular disease, depression, stroke, amputated digit    Examination-Activity Limitations  Bathing;Bed Mobility;Bend;Caring for Others;Carry;Continence;Dressing;Sleep;Sit;Reach Overhead;Locomotion Level;Lift;Hygiene/Grooming;Squat;Stairs;Stand;Toileting;Transfers    Examination-Participation Restrictions  Church;Cleaning;Community Activity;Driving;Interpersonal Relationship;Meal Prep;Medication Management;Laundry;Shop;Volunteer;Yard Work    [employer]chant [employer] officer  Evolving/Moderate complexity    Rehab Potential  Fair    PT Frequency  2x / week    PT Treatment/Interventions  ADLs/Self Care Home Management;Aquatic Therapy;Cryotherapy;Biofeedback;Electrical Stimulation;Iontophoresis 58m/ml Dexamethasone;Moist Heat;Traction;Ultrasound;Functional mobility training;Stair training;Gait training;DME Instruction;Therapeutic activities;Therapeutic exercise;Balance training;Neuromuscular re-education;Orthotic Fit/Training;Patient/family education;Cognitive remediation;Wheelchair mobility training;Manual techniques;Taping;Splinting;Energy conservation    PT Next Visit Plan  give HEP, practice SPT, ambulation, COM    PT Home Exercise Plan  give next session    Consulted and Agree with Plan of Care  Family member/caregiver    Family Member Consulted  husband       Patient will benefit from skilled therapeutic intervention in order to improve the following deficits and impairments:  Abnormal gait, Decreased activity tolerance, Decreased coordination,  Decreased balance, Decreased endurance, Decreased knowledge of precautions, Decreased knowledge of use of DME, Decreased safety awareness, Decreased range of motion, Decreased mobility, Decreased strength, Difficulty walking, Hypomobility, Increased fascial restricitons, Impaired flexibility, Impaired perceived functional ability, Impaired sensation, Impaired tone, Impaired UE functional use, Postural dysfunction, Improper body mechanics, Pain, Obesity  Visit Diagnosis: 1. Muscle weakness (generalized)   2. Other lack of coordination   3. Collagen vascular disease ([place]   4. Other abnormalities of gait and mobility        Problem List Patient Active Problem List   Diagnosis Date Noted  . Collagen vascular disease ([place] 05/21/2017  . Gangrene of finger of right hand ([place] 05/21/2017    MAlanson Puls PT DPT 06/08/2019, 10:46 AM  [facility] [address] [address]Phone: [phone]  Fax:  [phone] Name: Ariel SARRATTMRN: [id number] of Birth: 104/18/1947

## 2019-06-09 ENCOUNTER — Ambulatory Visit: Payer: Medicare Other | Admitting: Physical Therapy

## 2019-06-11 ENCOUNTER — Ambulatory Visit: Payer: Medicare Other | Admitting: Physical Therapy

## 2019-06-11 ENCOUNTER — Ambulatory Visit: Payer: Medicare Other | Admitting: Occupational Therapy

## 2019-06-11 ENCOUNTER — Encounter: Payer: Medicare Other | Admitting: Occupational Therapy

## 2019-06-16 ENCOUNTER — Encounter: Payer: Self-pay | Admitting: Occupational Therapy

## 2019-06-16 ENCOUNTER — Other Ambulatory Visit: Payer: Self-pay

## 2019-06-16 ENCOUNTER — Ambulatory Visit: Payer: Medicare Other | Admitting: Physical Therapy

## 2019-06-16 ENCOUNTER — Ambulatory Visit: Payer: Medicare Other | Admitting: Occupational Therapy

## 2019-06-16 ENCOUNTER — Encounter: Payer: Self-pay | Admitting: Physical Therapy

## 2019-06-16 ENCOUNTER — Encounter: Payer: Medicare Other | Admitting: Occupational Therapy

## 2019-06-16 DIAGNOSIS — M6281 Muscle weakness (generalized): Secondary | ICD-10-CM

## 2019-06-16 DIAGNOSIS — R278 Other lack of coordination: Secondary | ICD-10-CM

## 2019-06-16 DIAGNOSIS — M359 Systemic involvement of connective tissue, unspecified: Secondary | ICD-10-CM

## 2019-06-16 DIAGNOSIS — R2689 Other abnormalities of gait and mobility: Secondary | ICD-10-CM

## 2019-06-16 NOTE — Therapy (Signed)
[place] MAIN [facility] SERVICES [address] [place], [place], [address] Phone: [phone]   Fax:  [phone]  Occupational Therapy Treatment  Patient Details  Name: Ariel Alvarez MRN: [id number] Date of Birth: 04/26/1946 No data recorded  Encounter Date: 06/16/2019  OT End of Session - 06/16/19 1231    Visit Number  3    Number of Visits  24    Date for OT Re-Evaluation  08/18/19    OT Start Time  0845    OT Stop Time  0930    OT Time Calculation (min)  45 min    Activity Tolerance  Patient tolerated treatment well    Behavior During Therapy  [facility] for tasks assessed/performed;Anxious       Past Medical History:  Diagnosis Date  . Anxiety   . Collagen vascular disease ([place])   . Depression   . Stroke [facility])     Past Surgical History:  Procedure Laterality Date  . CHOLECYSTECTOMY      There were no vitals filed for this visit.  Subjective Assessment - 06/16/19 1229    Subjective   Pt. reports being very groggy today, as she took Tylenol PM last night, and had to wake up a 4:30 a.m. in order to get to her therapy appointment.    Patient is accompanied by:  Family member    Pertinent History  Pt. is a 73 y.o. female who sustained multiple CVAs with left hemiplegia, Pt. PMHx includes: Depression, Collagen Vascular Disease, Anxiety. Pt. recieved therapy services in thre past. Pt. resides at home with her husband who is supportive, and assists with her care. Pt. is alone during the day, as her husband works.    Currently in Pain?  No/denies      OT TREATMENT    Therapeutic Exercise:  Pt. Tolerated PROM in all joint ranges of the LUE, and hand.  ROM was performed within pain free range. Pt. Tolerated prolonged slow, gentle stretching of the left hand, and digits secondary to increased flexor tone, and tightness.  Self-care:  Pt. performed writing tasks with her right hand while seated at a table. Pt. Worked on printing, and  signing her name, copying a sentence, and functional writing filling out a simulated personal check. Pt. attempted writing with multiple thicknesses of adaptive pen handles. Pt.presentesd with the most legible writing when using a 1" hard red adaptive handle. The red foam handle was too large, and the triangular handled pen  Was too small. Pt. Reports that she is using a tripod pen grip at home, but had the most success with writing when using a hard red built-up adaptive handle.                         OT Education - 06/16/19 1231    Education Details  Adaptative pen handles    [name]  Patient    Methods  Explanation;Handout    Comprehension  Verbalized understanding;Returned demonstration          OT Long Term Goals - 05/26/19 1723      OT LONG TERM GOAL #1   Title  Pt. will improve RUE strength to be be able to reach into cabinetry to put items away without compensating posteriorly with her trunk.    Baseline  05/26/2019: Pt. leans posteriorly when reaching with her RUE    Time  12    Period  Weeks  Status  New    Target Date  08/18/19      OT LONG TERM GOAL #2   Title  Pt. will independently complete light meal preparation in standing    Baseline  05/26/2019: Pt. has difficulty    Time  12    Period  Weeks    Status  New    Target Date  08/18/19      OT LONG TERM GOAL #3   Title  Pt. will improve right hand [facility] skills to be able to independently apply hook earrings.    Baseline  05/26/2019: R: 1 min &1 sec. Pt. is unable to place earrings.    Time  12    Period  Weeks    Status  New    Target Date  08/18/19      OT LONG TERM GOAL #4   Title  Pt. will improve right grip strength to be able to hold a can at the can opener    Baseline  05/26/2019: R: 15#. Pt. is unable to hold, and use a can opener    Time  12    Period  Weeks    Status  New    Target Date  08/18/19      OT LONG TERM GOAL #5   Title  Pt. will require supervision to  sweep the floor.    Baseline  05/26/2019: Pt. is unable    Time  12    Period  Weeks    Status  New    Target Date  08/18/19            Plan - 06/16/19 1231    Clinical Impression Statement  Pt. reports being very groggy during the session today secondary having had to take Tylenol PM last night, and then having to wake up at 4:30 a.m. to make it to her therapy appointment. Pt. continues to present with increased flexor tone, tightness, and spasticity in the LUE which limits her ability to engage her left hand during light ADL, and IADL tasks. Pt. reports that she is unabvle to find her splint, and reports that she does not have it anymore. Pt. continues to work on improving UE functional status.    OT Occupational Profile and History  Comprehensive Assessment- Review of records and extensive additional review of physical, cognitive, psychosocial history related to current functional performance    Occupational performance deficits (Please refer to evaluation for details):  ADL's;IADL's    Body Structure / Function / Physical Skills  ADL;IADL;FMC;ROM;Pain;UE functional use;Strength;Dexterity    Rehab Potential  Good    Clinical Decision Making  Several treatment options, min-mod task modification necessary    OT Frequency  2x / week    OT Duration  12 weeks    OT Treatment/Interventions  Self-care/ADL training;Neuromuscular education;Manual Therapy;Therapeutic activities;Therapeutic exercise;Patient/family education    OT Home Exercise Plan  --    Consulted and Agree with Plan of Care  Patient       Patient will benefit from skilled therapeutic intervention in order to improve the following deficits and impairments:   Body Structure / Function / Physical Skills: ADL, IADL, FMC, ROM, Pain, UE functional use, Strength, Dexterity       Visit Diagnosis: 1. Muscle weakness (generalized)   2. Other lack of coordination       Problem List Patient Active Problem List   Diagnosis  Date Noted  . Collagen vascular disease (HCC) 05/21/2017  . 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 [place], [place], [address] Phone: [phone]   Fax:  [phone]  Name: Ariel Alvarez MRN: [id number] Date of Birth: 1946/02/03

## 2019-06-16 NOTE — Therapy (Addendum)
[place] MAIN [facility] SERVICES [address] [place], [place], [address] Phone: [phone]   Fax:  [phone]  Physical Therapy Treatment  Patient Details  Name: Ariel Alvarez MRN: [id number] Date of Birth: 03-20-1946 Referring Provider (PT): Okey Regal   Encounter Date: 06/16/2019  PT End of Session - 06/16/19 0848    Visit Number  15    Number of Visits  16    Date for PT Re-Evaluation  08/05/19    PT Start Time  0800    PT Stop Time  0840    PT Time Calculation (min)  40 min    Equipment Utilized During Treatment  Gait belt    Activity Tolerance  Patient limited by fatigue;Other (comment)   fear of movement   Behavior During Therapy  WFL for tasks assessed/performed;Anxious   tangential nature of conversation, require max encouragement/cueing      Past Medical History:  Diagnosis Date  . Anxiety   . Collagen vascular disease ([place])   . Depression   . Stroke [facility])     Past Surgical History:  Procedure Laterality Date  . CHOLECYSTECTOMY      There were no vitals filed for this visit.  Subjective Assessment - 06/16/19 0827    Subjective  Patinet has no new complaints. She is doing well today.Her left foot and leg is hurting 4/10. She is having a colon test today.,    Patient is accompained by:  Family member    Pertinent History  Patient presents for instability s/p CVA in 01/2017. Patient was recently diagnosed with osteoporosis in bilateral legs and feet. Is potentially getting surgery in 2 weeks on both feet. After discharge from outpatient physical therapy had one session with home health PT but no other therapy. Reports no falls. Reports walking from living room to kitchen and back to walkway however her LLE gives out frequently . Per husband and patient she was going to an adult day center prior to [facility]. Patient has a flat/level entry. Trying to get a power chair at this time from [employer]. Has a  manual chair at this time, walker, hemi walker, quad cane. Doing bird baths but has a walk in tub she can't get in, does have a shower chair. Patient and husband aware that patient will need caregiver with her due to toileting needs.    Limitations  Sitting;Lifting;Standing;Walking;House hold activities    How long can you sit comfortably?  recliner 5 hours, 30 minute sin wheelchair    How long can you stand comfortably?  4 minutes with UE support    How long can you walk comfortably?  15 ft    Patient Stated Goals  to walk better    Currently in Pain?  Yes    Pain Score  4     Pain Location  Foot    Pain Orientation  Left    Pain Descriptors / Indicators  Aching    Pain Onset  More than a month ago        Treatment  Transfer <>multi level surfaces <>nu-step withCGAassist x 6 transfers   Transfers sit to stand x 5 with hemiwalker andCGAassist and Vc for safety and sequencing  PROM to left heel cord and left knee hamstring x 30 sec x 3  LAQ BLE x 10 x 2   Hip flex x 10 x 2  Gait training with hemi walker 50fet x 4, wc following, CGA and VC for  step length and posture  Standing and weight shifting left and right x 2 mins x 3 reps   Pt educated about posture and technique with exercises to improve exercise technique, movement at target joints, with min verbal, visual, tactile cues                        PT Education - 06/16/19 0828    Education Details  HEP    [name]  Patient    Methods  Explanation    Comprehension  Verbalized understanding;Verbal cues required;Need further instruction       PT Short Term Goals - 05/21/19 1446      PT SHORT TERM GOAL #1   Title  Patient will perform one sit to stand independently from wheelchair to increase independence with transfers    Baseline  6/17: min/mod A    Time  2    Period  Weeks    Status  Partially Met    Target Date  04/29/19      PT SHORT TERM GOAL #2   Title   Patient will be independent in home exercise program to improve strength/mobility for better functional independence with ADLs.    Baseline  HEP to be given next session    Time  2    Status  Partially Met    Target Date  04/29/19        PT Long Term Goals - 05/21/19 1446      PT LONG TERM GOAL #1   Title  Patient will perform 5 STS < 40 seconds independently with hemi walker to demonstrate improved home mobility.    Baseline  6/17: unable to perform one time without Min/Mod A, 05/21/19 1.06 sec    Time  8    Period  Weeks    Status  Partially Met    Target Date  06/10/19      PT LONG TERM GOAL #2   Title  Patient will stand without UE support for 3-5 minutes for ADL performance without LOB to increase independence with dressing and toileting.    Baseline  6/17: unable to stand without UE support, 05/21/19 1 min 30 sec    Time  8    Period  Weeks    Status  New    Target Date  06/10/19      PT LONG TERM GOAL #3   Title  Patient will ambulate 60 ft with hemi walker and CGA to improve home mobility and independence with gait.     Baseline  6/17: 10 ft, 05/21/19 17 feet    Time  9    Period  Weeks    Status  Partially Met    Target Date  06/10/19      PT LONG TERM GOAL #4   Title  Patient will increase BLE gross strength to 4+/5 as to improve functional strength for independent gait, increased standing tolerance and increased ADL ability    Baseline  6/17: hips grossly 2+/5 knees 3+/5    Time  8    Period  Weeks    Status  Partially Met    Target Date  06/10/19            Plan - 06/16/19 0849    Clinical Impression Statement  Patient has decreased gait speed with hemiwalker and decreased step height and length LLE with step to gait pattern. She ambulates 45 feet x 4 with CGA and vC  for weight shift. She has tight L ankle heel cord and tone in L ankle. She performs seated LE exercises and mobility and safety training with good carryover. She will continue to benefit from  skilled PT to improve mobility in her home.    Personal Factors and Comorbidities  Behavior Pattern;Comorbidity 3+;Fitness;Past/Current Experience;Social Background;Time since onset of injury/illness/exacerbation;Transportation    Comorbidities  anxiety, collagen vascular disease, depression, stroke, amputated digit    Examination-Activity Limitations  Bathing;Bed Mobility;Bend;Caring for Others;Carry;Continence;Dressing;Sleep;Sit;Reach Overhead;Locomotion Level;Lift;Hygiene/Grooming;Squat;Stairs;Stand;Toileting;Transfers    Examination-Participation Restrictions  Church;Cleaning;Community Activity;Driving;Interpersonal Relationship;Meal Prep;Medication Management;Laundry;Shop;Volunteer;Yard Work    [employer]  Evolving/Moderate complexity    Rehab Potential  Fair    PT Frequency  2x / week    PT Treatment/Interventions  ADLs/Self Care Home Management;Aquatic Therapy;Cryotherapy;Biofeedback;Electrical Stimulation;Iontophoresis 37m/ml Dexamethasone;Moist Heat;Traction;Ultrasound;Functional mobility training;Stair training;Gait training;DME Instruction;Therapeutic activities;Therapeutic exercise;Balance training;Neuromuscular re-education;Orthotic Fit/Training;Patient/family education;Cognitive remediation;Wheelchair mobility training;Manual techniques;Taping;Splinting;Energy conservation    PT Next Visit Plan  give HEP, practice SPT, ambulation, COM    PT Home Exercise Plan  give next session    Consulted and Agree with Plan of Care  Family member/caregiver    Family Member Consulted  husband       Patient will benefit from skilled therapeutic intervention in order to improve the following deficits and impairments:  Abnormal gait, Decreased activity tolerance, Decreased coordination, Decreased balance, Decreased endurance, Decreased knowledge of precautions, Decreased knowledge of use of DME, Decreased safety awareness, Decreased range of motion, Decreased mobility, Decreased  strength, Difficulty walking, Hypomobility, Increased fascial restricitons, Impaired flexibility, Impaired perceived functional ability, Impaired sensation, Impaired tone, Impaired UE functional use, Postural dysfunction, Improper body mechanics, Pain, Obesity  Visit Diagnosis: 1. Muscle weakness (generalized)   2. Other lack of coordination   3. [place] vascular disease ([place]   4. Other abnormalities of gait and mobility        Problem List Patient Active Problem List   Diagnosis Date Noted  . [place] 05/21/2017  . Gangrene of finger of right hand ([facility] 05/21/2017    MAlanson Puls PVirginiaDPT 06/16/2019, 8:53 AM  [place] [facility] [address] [address]Phone: [phone]  Fax:  [phone] Name: Ariel GRAFFIUSMRN: [id number] of Birth: [birth year]

## 2019-06-18 ENCOUNTER — Encounter: Payer: Medicare Other | Admitting: Occupational Therapy

## 2019-06-18 ENCOUNTER — Other Ambulatory Visit: Payer: Self-pay

## 2019-06-18 ENCOUNTER — Ambulatory Visit: Payer: Medicare Other | Admitting: Physical Therapy

## 2019-06-18 ENCOUNTER — Ambulatory Visit: Payer: Medicare Other | Admitting: Occupational Therapy

## 2019-06-18 ENCOUNTER — Encounter: Payer: Self-pay | Admitting: Occupational Therapy

## 2019-06-18 ENCOUNTER — Encounter: Payer: Self-pay | Admitting: Physical Therapy

## 2019-06-18 DIAGNOSIS — R278 Other lack of coordination: Secondary | ICD-10-CM

## 2019-06-18 DIAGNOSIS — M6281 Muscle weakness (generalized): Secondary | ICD-10-CM

## 2019-06-18 NOTE — Therapy (Signed)
[facility] MAIN [facility] SERVICES [address] [place], [place], [address] Phone: [phone]   Fax:  [phone]  Occupational Therapy Treatment  Patient Details  Name: Ariel Alvarez MRN: [id number] Date of Birth: 25-Aug-1946 No data recorded  Encounter Date: 06/18/2019  OT End of Session - 06/22/19 1625    Visit Number  4    Number of Visits  24    Date for OT Re-Evaluation  08/18/19    OT Start Time  0846    OT Stop Time  0930    OT Time Calculation (min)  44 min    Activity Tolerance  Patient tolerated treatment well    Behavior During Therapy  [facility] for tasks assessed/performed;Anxious       Past Medical History:  Diagnosis Date  . Anxiety   . Collagen vascular disease (HCC)   . Depression   . Stroke [facility])     Past Surgical History:  Procedure Laterality Date  . CHOLECYSTECTOMY      There were no vitals filed for this visit.  Subjective Assessment - 06/22/19 1624    Subjective   Patient reports she is doing well today, had PT first. Patient reports she used to have a splint at home for nighttime for her left UE but cannot find it since she moved. She reports hand fisting and has been trying to keep her nails shorter, so they don't dig into her palm at night.    Pertinent History  Pt. is a 73 y.o. female who sustained multiple CVAs with left hemiplegia, Pt. PMHx includes: Depression, Collagen Vascular Disease, Anxiety. Pt. recieved therapy services in thre past. Pt. resides at home with her husband who is supportive, and assists with her care. Pt. is alone during the day, as her husband works.    Patient Stated Goals  To be stronger, be able to take care of myself while my husband works.    Currently in Pain?  No/denies    Pain Score  0-No pain           Patient seen for manipulation of round ball pegs to place into grid with verbal cues to turn to place into grid and then remove using hand for storage, emphasis on prehension  patterns, manipulation skills, translatory skills of the hand as well as using the hand for storage.   Therapeutic exercise: Patient seen for right hand Grip strengthening with 2nd setting for 2 X 25 reps, short rest breaks as needed, cues for positioning of hand on hand gripper.  Patient also seen for PROM of left hand, digits and wrist to prevent further contractures.  Will benefit from resting hand splint for home. Response to tx:  Patient presents with increased tightness in fingers on left hand with PROM.  Recommend she be fitted for nighttime resting hand splint to further prevent contractures.  She may also benefit from a palm protector during times she is not wearing the splint.  Patient being seen for right hand strengthening and coordination after recent decline affecting her ability to perform tasks with her unaffected side.  Continue to work towards goals in plan of care, will explore options for splint and make recommendations and measurements in next 1-2 sessions.                 OT Education - 06/22/19 1625    Education Details  HEP, strength, fine motor, ROM    [name]  Patient    Methods  Explanation;Demonstration;Verbal cues    Comprehension  Verbal cues required;Returned demonstration;Verbalized understanding          OT Long Term Goals - 05/26/19 1723      OT LONG TERM GOAL #1   Title  Pt. will improve RUE strength to be be able to reach into cabinetry to put items away without compensating posteriorly with her trunk.    Baseline  05/26/2019: Pt. leans posteriorly when reaching with her RUE    Time  12    Period  Weeks    Status  New    Target Date  08/18/19      OT LONG TERM GOAL #2   Title  Pt. will independently complete light meal preparation in standing    Baseline  05/26/2019: Pt. has difficulty    Time  12    Period  Weeks    Status  New    Target Date  08/18/19      OT LONG TERM GOAL #3   Title  Pt. will improve right hand [facility]  skills to be able to independently apply hook earrings.    Baseline  05/26/2019: R: 1 min &1 sec. Pt. is unable to place earrings.    Time  12    Period  Weeks    Status  New    Target Date  08/18/19      OT LONG TERM GOAL #4   Title  Pt. will improve right grip strength to be able to hold a can at the can opener    Baseline  05/26/2019: R: 15#. Pt. is unable to hold, and use a can opener    Time  12    Period  Weeks    Status  New    Target Date  08/18/19      OT LONG TERM GOAL #5   Title  Pt. will require supervision to sweep the floor.    Baseline  05/26/2019: Pt. is unable    Time  12    Period  Weeks    Status  New    Target Date  08/18/19            Plan - 06/22/19 1626    Clinical Impression Statement  Patient presents with increased tightness in fingers on left hand with PROM.  Recommend she be fitted for nighttime resting hand splint to further prevent contractures.  She may also benefit from a palm protector during times she is not wearing the splint.  Patient being seen for right hand strengthening and coordination after recent decline affecting her ability to perform tasks with her unaffected side.  Continue to work towards goals in plan of care, will explore options for splint and make recommendations and measurements in next 1-2 sessions.    OT Occupational Profile and History  Comprehensive Assessment- Review of records and extensive additional review of physical, cognitive, psychosocial history related to current functional performance    Occupational performance deficits (Please refer to evaluation for details):  ADL's;IADL's    Body Structure / Function / Physical Skills  ADL;IADL;FMC;ROM;Pain;UE functional use;Strength;Dexterity    Rehab Potential  Good    Clinical Decision Making  Several treatment options, min-mod task modification necessary    OT Frequency  2x / week    OT Duration  12 weeks    OT Treatment/Interventions  Self-care/ADL training;Neuromuscular  education;Manual Therapy;Therapeutic activities;Therapeutic exercise;Patient/family education    Consulted and Agree with Plan of Care  Patient  Patient will benefit from skilled therapeutic intervention in order to improve the following deficits and impairments:   Body Structure / Function / Physical Skills: ADL, IADL, FMC, ROM, Pain, UE functional use, Strength, Dexterity       Visit Diagnosis: 1. Muscle weakness (generalized)   2. Other lack of coordination       Problem List Patient Active Problem List   Diagnosis Date Noted  . Collagen vascular disease ([place]) 05/21/2017  . Gangrene of finger of right hand [facility]) 05/21/2017   Amy T Lovett, OTR/L, CLT  Lovett,Amy 06/22/2019, 4:27 PM  [place] MAIN [facility] SERVICES [address] [place], [place], [address] Phone: [phone]   Fax:  [phone]  Name: Ariel Alvarez MRN: [id number] Date of Birth: [birth year]

## 2019-06-18 NOTE — Therapy (Addendum)
[place] MAIN [facility] SERVICES [address] [place], [place], [address] Phone: [phone]   Fax:  [phone]  Physical Therapy Treatment  Patient Details  Name: Ariel Alvarez MRN: [id number] Date of Birth: 1946/05/30 Referring Provider (PT): Okey Regal   Encounter Date: 06/18/2019  PT End of Session - 06/18/19 0902    Visit Number  16    Number of Visits  16    Date for PT Re-Evaluation  08/05/19    PT Start Time  0800    PT Stop Time  0845    PT Time Calculation (min)  45 min    Equipment Utilized During Treatment  Gait belt;Other (comment)   Hemiwalker   Activity Tolerance  Patient limited by fatigue;Other (comment)    Behavior During Therapy  [facility] for tasks assessed/performed;Anxious       Past Medical History:  Diagnosis Date  . Anxiety   . Collagen vascular disease ([place])   . Depression   . Stroke [facility])     Past Surgical History:  Procedure Laterality Date  . CHOLECYSTECTOMY      There were no vitals filed for this visit.  Subjective Assessment - 06/18/19 0901    Subjective  Patient reports no new problems and no falls since her last visit.    Pertinent History  Patient presents for instability s/p CVA in 01/2017. Patient was recently diagnosed with osteoporosis in bilateral legs and feet. Is potentially getting surgery in 2 weeks on both feet. After discharge from outpatient physical therapy had one session with home health PT but no other therapy. Reports no falls. Reports walking from living room to kitchen and back to walkway however her LLE gives out frequently . Per husband and patient she was going to an adult day center prior to [place]. Patient has a flat/level entry. Trying to get a power chair at this time from [employer]. Has a manual chair at this time, walker, hemi walker, quad cane. Doing bird baths but has a walk in tub she can't get in, does have a shower chair. Patient and husband aware  that patient will need caregiver with her due to toileting needs.    Limitations  Sitting;Lifting;Standing;Walking;House hold activities    How long can you sit comfortably?  recliner 5 hours, 30 minute sin wheelchair    How long can you stand comfortably?  4 minutes with UE support    How long can you walk comfortably?  15 ft    Patient Stated Goals  to walk better    Currently in Pain?  No/denies    Pain Score  0-No pain           Treatment:  Therapeutic Exercise:   Nustep L2 x 5 min; seated LAQ 20x B; seated alt marching 2x10B; seated L HS and achilles stretches 5x30 seconds.  Gait Training:  45 ft x 4 trails with CGA and hemiwalker with R UE and w/c follow.  Cueing for improved stride length as trials progressed.                    PT Education - 06/18/19 0902    Education Details  HEP review    [name]  Patient    Methods  Explanation    Comprehension  Verbalized understanding       PT Short Term Goals - 05/21/19 1446      PT SHORT TERM GOAL #1   Title  Patient  will perform one sit to stand independently from wheelchair to increase independence with transfers    Baseline  6/17: min/mod A    Time  2    Period  Weeks    Status  Partially Met    Target Date  04/29/19      PT SHORT TERM GOAL #2   Title  Patient will be independent in home exercise program to improve strength/mobility for better functional independence with ADLs.    Baseline  HEP to be given next session    Time  2    Status  Partially Met    Target Date  04/29/19        PT Long Term Goals - 05/21/19 1446      PT LONG TERM GOAL #1   Title  Patient will perform 5 STS < 40 seconds independently with hemi walker to demonstrate improved home mobility.    Baseline  6/17: unable to perform one time without Min/Mod A, 05/21/19 1.06 sec    Time  8    Period  Weeks    Status  Partially Met    Target Date  06/10/19      PT LONG TERM GOAL #2   Title  Patient will stand  without UE support for 3-5 minutes for ADL performance without LOB to increase independence with dressing and toileting.    Baseline  6/17: unable to stand without UE support, 05/21/19 1 min 30 sec    Time  8    Period  Weeks    Status  New    Target Date  06/10/19      PT LONG TERM GOAL #3   Title  Patient will ambulate 60 ft with hemi walker and CGA to improve home mobility and independence with gait.     Baseline  6/17: 10 ft, 05/21/19 17 feet    Time  9    Period  Weeks    Status  Partially Met    Target Date  06/10/19      PT LONG TERM GOAL #4   Title  Patient will increase BLE gross strength to 4+/5 as to improve functional strength for independent gait, increased standing tolerance and increased ADL ability    Baseline  6/17: hips grossly 2+/5 knees 3+/5    Time  8    Period  Weeks    Status  Partially Met    Target Date  06/10/19            Plan - 06/18/19 0903    Clinical Impression Statement  Pt needed verbal cueing to improve step length with gait training as gait trials increased and she became more fatigued.  She responded well to manual stretching of L hamstrings and L achilles tendon in sitting today.  She will continue to benefit from skilled PT to improve mobility in her home.    Personal Factors and Comorbidities  Behavior Pattern;Comorbidity 3+;Fitness;Past/Current Experience;Social Background;Time since onset of injury/illness/exacerbation;Transportation    Comorbidities  anxiety, collagen vascular disease, depression, stroke, amputated digit    Examination-Activity Limitations  Bathing;Bed Mobility;Bend;Caring for Others;Carry;Continence;Dressing;Sleep;Sit;Reach Overhead;Locomotion Level;Lift;Hygiene/Grooming;Squat;Stairs;Stand;Toileting;Transfers    Examination-Participation Restrictions  Church;Cleaning;Community Activity;Driving;Interpersonal Relationship;Meal Prep;Medication Management;Laundry;Shop;Volunteer;Yard Work    Stability/Clinical Decision Making   Evolving/Moderate complexity    Clinical Decision Making  Moderate    Rehab Potential  Fair    PT Frequency  2x / week    PT Treatment/Interventions  ADLs/Self Care Home Management;Aquatic Therapy;Cryotherapy;Biofeedback;Electrical Stimulation;Iontophoresis 81m/ml Dexamethasone;Moist Heat;Traction;Ultrasound;Functional mobility training;Stair training;Gait  training;DME Instruction;Therapeutic activities;Therapeutic exercise;Balance training;Neuromuscular re-education;Orthotic Fit/Training;Patient/family education;Cognitive remediation;Wheelchair mobility training;Manual techniques;Taping;Splinting;Energy conservation    PT Next Visit Plan  give HEP, practice SPT, ambulation, COM    PT Home Exercise Plan  Reviewed LAQs and alt hip flexion with pt today; pt able to verbalize and demonstrate understanding       Patient will benefit from skilled therapeutic intervention in order to improve the following deficits and impairments:  Abnormal gait, Decreased activity tolerance, Decreased coordination, Decreased balance, Decreased endurance, Decreased knowledge of precautions, Decreased knowledge of use of DME, Decreased safety awareness, Decreased range of motion, Decreased mobility, Decreased strength, Difficulty walking, Hypomobility, Increased fascial restricitons, Impaired flexibility, Impaired perceived functional ability, Impaired sensation, Impaired tone, Impaired UE functional use, Postural dysfunction, Improper body mechanics, Pain, Obesity  Visit Diagnosis: 1. Muscle weakness (generalized)   2. Other lack of coordination        Problem List Patient Active Problem List   Diagnosis Date Noted  . Collagen vascular disease ([place]) 05/21/2017  . Gangrene of finger of right hand ([place]) 05/21/2017    Esdras Delair, MPT 06/18/2019, 9:10 AM  [place] MAIN [facility] SERVICES [address] [place], [place], [address] Phone: [phone]   Fax:   [phone]  Name: Ariel Alvarez MRN: [id number] Date of Birth: [birth year]

## 2019-06-22 ENCOUNTER — Encounter: Payer: Self-pay | Admitting: Occupational Therapy

## 2019-06-23 ENCOUNTER — Ambulatory Visit: Payer: Medicare Other | Admitting: Occupational Therapy

## 2019-06-23 ENCOUNTER — Ambulatory Visit: Payer: Medicare Other | Admitting: Physical Therapy

## 2019-06-23 ENCOUNTER — Encounter: Payer: Self-pay | Admitting: Occupational Therapy

## 2019-06-23 ENCOUNTER — Encounter: Payer: Medicare Other | Admitting: Occupational Therapy

## 2019-06-23 ENCOUNTER — Other Ambulatory Visit: Payer: Self-pay

## 2019-06-23 DIAGNOSIS — M6281 Muscle weakness (generalized): Secondary | ICD-10-CM

## 2019-06-23 DIAGNOSIS — R278 Other lack of coordination: Secondary | ICD-10-CM

## 2019-06-23 DIAGNOSIS — M359 Systemic involvement of connective tissue, unspecified: Secondary | ICD-10-CM

## 2019-06-23 DIAGNOSIS — R2689 Other abnormalities of gait and mobility: Secondary | ICD-10-CM

## 2019-06-23 NOTE — Addendum Note (Signed)
Addended by: Alanson Puls on: 06/23/2019 10:14 AM   Modules accepted: Orders

## 2019-06-23 NOTE — Therapy (Signed)
[place] MAIN [facility] SERVICES [address] [place], [place], [address] Phone: [phone]   Fax:  [phone]  Occupational Therapy Treatment  Patient Details  Name: Ariel Alvarez MRN: [id number] Date of Birth: 17-Oct-1946 No data recorded  Encounter Date: 06/23/2019  OT End of Session - 06/23/19 1234    Visit Number  5    Number of Visits  24    Date for OT Re-Evaluation  08/18/19    OT Start Time  0845    OT Stop Time  0930    OT Time Calculation (min)  45 min    Equipment Utilized During Treatment  adaptive writing aids, dycem    Activity Tolerance  Patient tolerated treatment well    Behavior During Therapy  [facility] for tasks assessed/performed;Anxious       Past Medical History:  Diagnosis Date  . Anxiety   . Collagen vascular disease ([place])   . Depression   . Stroke [facility])     Past Surgical History:  Procedure Laterality Date  . CHOLECYSTECTOMY      There were no vitals filed for this visit.  Subjective Assessment - 06/23/19 1233    Subjective   Pt. reports that her feet are hurting today.    Patient is accompanied by:  Family member    Pertinent History  Pt. is a 73 y.o. female who sustained multiple CVAs with left hemiplegia, Pt. PMHx includes: Depression, Collagen Vascular Disease, Anxiety. Pt. recieved therapy services in thre past. Pt. resides at home with her husband who is supportive, and assists with her care. Pt. is alone during the day, as her husband works.    Patient Stated Goals  To be stronger, be able to take care of myself while my husband works.    Currently in Pain?  Yes    Pain Score  5     Pain Location  Foot    Pain Orientation  Left;Right      OT TREATMENT    Neuro muscular re-education:  Pt. worked on right hand [facility] skills sorting silk flower, grasping the stems, and reaching to arrange them in a vase set at a tabletop on the right. Pt. worked on grasping 1" resistive cubes alternating thumb  opposition to the tip of the 2nd digit while the board is placed at a vertical angle. Pt. worked on pressing the cubes back into place while alternating isolated 2nd digit extension.  Therapeutic Exercise:  Pt. Tolerated PROM in all joint ranges of the LUE, and hand.  ROM was performed within pain free range. Pt. Tolerated prolonged slow, gentle stretching of the left hand, and digits secondary to increased flexor tone, and tightness.                        OT Education - 06/23/19 1234    Education Details  HEP, strength, fine motor, ROM    [name]  Patient    Methods  Explanation;Demonstration;Verbal cues    Comprehension  Verbal cues required;Returned demonstration;Verbalized understanding          OT Long Term Goals - 05/26/19 1723      OT LONG TERM GOAL #1   Title  Pt. will improve RUE strength to be be able to reach into cabinetry to put items away without compensating posteriorly with her trunk.    Baseline  05/26/2019: Pt. leans posteriorly when reaching with her RUE    Time  12  Period  Weeks    Status  New    Target Date  08/18/19      OT LONG TERM GOAL #2   Title  Pt. will independently complete light meal preparation in standing    Baseline  05/26/2019: Pt. has difficulty    Time  12    Period  Weeks    Status  New    Target Date  08/18/19      OT LONG TERM GOAL #3   Title  Pt. will improve right hand [facility] skills to be able to independently apply hook earrings.    Baseline  05/26/2019: R: 1 min &1 sec. Pt. is unable to place earrings.    Time  12    Period  Weeks    Status  New    Target Date  08/18/19      OT LONG TERM GOAL #4   Title  Pt. will improve right grip strength to be able to hold a can at the can opener    Baseline  05/26/2019: R: 15#. Pt. is unable to hold, and use a can opener    Time  12    Period  Weeks    Status  New    Target Date  08/18/19      OT LONG TERM GOAL #5   Title  Pt. will require supervision to  sweep the floor.    Baseline  05/26/2019: Pt. is unable    Time  12    Period  Weeks    Status  New    Target Date  08/18/19            Plan - 06/23/19 1709    Clinical Impression Statement  Pt. continues to present with increased flexor tone, and tightness through the LUE, and hand. Pt. is tolerating slow gentle PROM stretching well. Pt. continues to work on improving right hand strength, and coordination skills, and improve ROM in the LUE.    OT Occupational Profile and History  Comprehensive Assessment- Review of records and extensive additional review of physical, cognitive, psychosocial history related to current functional performance    Occupational performance deficits (Please refer to evaluation for details):  ADL's;IADL's    Body Structure / Function / Physical Skills  ADL;IADL;FMC;ROM;Pain;UE functional use;Strength;Dexterity    Clinical Decision Making  Several treatment options, min-mod task modification necessary    OT Frequency  2x / week    OT Duration  12 weeks    OT Treatment/Interventions  Self-care/ADL training;Neuromuscular education;Manual Therapy;Therapeutic activities;Therapeutic exercise;Patient/family education    Consulted and Agree with Plan of Care  Patient       Patient will benefit from skilled therapeutic intervention in order to improve the following deficits and impairments:   Body Structure / Function / Physical Skills: ADL, IADL, FMC, ROM, Pain, UE functional use, Strength, Dexterity       Visit Diagnosis: Muscle weakness (generalized)  Other lack of coordination    Problem List Patient Active Problem List   Diagnosis Date Noted  . Collagen vascular disease ([facility]) 05/21/2017  . 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) 05/21/2017    Olegario Messier, MS, OTR/L 06/23/2019, 5:24 PM  [facility] MAIN [facility] SERVICES [address] [place], [place], [address] Phone: [phone]   Fax:   [phone]  Name: Ariel Alvarez MRN: [id number] Date of Birth: Feb 11, 1946

## 2019-06-23 NOTE — Therapy (Signed)
[place] MAIN [facility] SERVICES [address] [place], [place], [address] Phone: [phone]   Fax:  [phone]  Physical Therapy Treatment  Patient Details  Name: Ariel Alvarez MRN: [id number] Date of Birth: [birth year] Referring Provider (PT): Okey Regal   Encounter Date: 06/23/2019  PT End of Session - 06/23/19 1117    Visit Number  17    Number of Visits  33    Date for PT Re-Evaluation  06/10/19    PT Start Time  0801    PT Stop Time  0845    PT Time Calculation (min)  44 min    Equipment Utilized During Treatment  Gait belt;Other (comment)   Hemiwalker   Activity Tolerance  Patient limited by fatigue;Other (comment)    Behavior During Therapy  [facility] for tasks assessed/performed;Anxious       Past Medical History:  Diagnosis Date  . Anxiety   . Collagen vascular disease ([place])   . Depression   . Stroke [facility])     Past Surgical History:  Procedure Laterality Date  . CHOLECYSTECTOMY      There were no vitals filed for this visit.  Subjective Assessment - 06/23/19 1006    Subjective  Patient reports that her feet are both numb today and bothering her, but no pain.    Patient is accompained by:  Family member    Pertinent History  Patient presents for instability s/p CVA in 01/2017. Patient was recently diagnosed with osteoporosis in bilateral legs and feet. Is potentially getting surgery in 2 weeks on both feet. After discharge from outpatient physical therapy had one session with home health PT but no other therapy. Reports no falls. Reports walking from living room to kitchen and back to walkway however her LLE gives out frequently . Per husband and patient she was going to an adult day center prior to [place]. Patient has a flat/level entry. Trying to get a power chair at this time from [employer]. Has a manual chair at this time, walker, hemi walker, quad cane. Doing bird baths but has a walk in tub she can't  get in, does have a shower chair. Patient and husband aware that patient will need caregiver with her due to toileting needs.    Limitations  Sitting;Lifting;Standing;Walking;House hold activities    How long can you sit comfortably?  recliner 5 hours, 30 minute sin wheelchair    How long can you stand comfortably?  4 minutes with UE support    How long can you walk comfortably?  15 ft    Patient Stated Goals  to walk better    Currently in Pain?  No/denies    Pain Score  0-No pain    Pain Onset  More than a month ago           Treatment  Transfer <>multi level surfaces <>nu-step withCGAassist x 4 transfers   Transfers sit to standx 5with hemiwalker andCGAassist and Vc for safety and sequencing  PROM to left heel cord and left knee hamstring x 30 sec x 3  LAQ BLE x 10 x 2   Hip flex x 10 x 2  Gait training with hemi walker54fet x3, wc following, CGA and VC for step length and posture  Standing and weight shifting left and right x 1 mins x 2 reps   Pt educated about posture and technique with exercises to improve exercise technique, movement at target joints, with min verbal, visual, tactile cues  PT Education - 06/23/19 1007    Education Details  HEP, stretching left ankle and foot.    [name]  Patient    Methods  Explanation;Demonstration    Comprehension  Verbalized understanding;Need further instruction       PT Short Term Goals - 05/21/19 1446      PT SHORT TERM GOAL #1   Title  Patient will perform one sit to stand independently from wheelchair to increase independence with transfers    Baseline  6/17: min/mod A    Time  2    Period  Weeks    Status  Partially Met    Target Date  04/29/19      PT SHORT TERM GOAL #2   Title  Patient will be independent in home exercise program to improve strength/mobility for better functional independence with ADLs.    Baseline  HEP to be  given next session    Time  2    Status  Partially Met    Target Date  04/29/19        PT Long Term Goals - 05/21/19 1446      PT LONG TERM GOAL #1   Title  Patient will perform 5 STS < 40 seconds independently with hemi walker to demonstrate improved home mobility.    Baseline  6/17: unable to perform one time without Min/Mod A, 05/21/19 1.06 sec    Time  8    Period  Weeks    Status  Partially Met    Target Date  06/10/19      PT LONG TERM GOAL #2   Title  Patient will stand without UE support for 3-5 minutes for ADL performance without LOB to increase independence with dressing and toileting.    Baseline  6/17: unable to stand without UE support, 05/21/19 1 min 30 sec    Time  8    Period  Weeks    Status  New    Target Date  06/10/19      PT LONG TERM GOAL #3   Title  Patient will ambulate 60 ft with hemi walker and CGA to improve home mobility and independence with gait.     Baseline  6/17: 10 ft, 05/21/19 17 feet    Time  9    Period  Weeks    Status  Partially Met    Target Date  06/10/19      PT LONG TERM GOAL #4   Title  Patient will increase BLE gross strength to 4+/5 as to improve functional strength for independent gait, increased standing tolerance and increased ADL ability    Baseline  6/17: hips grossly 2+/5 knees 3+/5    Time  8    Period  Weeks    Status  Partially Met    Target Date  06/10/19            Plan - 06/23/19 1119    Clinical Impression Statement  Patient is improving with her transfers sit to stand but continues to need cues to get her left foot in the best place prior to the transfer and for leaning fwd. Her mobility improves after she warms up and after she has her left foot/ calf  stretched out with PROM. She is instructed in safe mobility including transfers and gait with hemiwalker for short distances with CGA and wc following for safety. She will continue to benefit from skilled PT to improve mobilty and quality of life.  Personal  Factors and Comorbidities  Behavior Pattern;Comorbidity 3+;Fitness;Past/Current Experience;Social Background;Time since onset of injury/illness/exacerbation;Transportation    Comorbidities  anxiety, collagen vascular disease, depression, stroke, amputated digit    Examination-Activity Limitations  Bathing;Bed Mobility;Bend;Caring for Others;Carry;Continence;Dressing;Sleep;Sit;Reach Overhead;Locomotion Level;Lift;Hygiene/Grooming;Squat;Stairs;Stand;Toileting;Transfers    Examination-Participation Restrictions  Church;Cleaning;Community Activity;Driving;Interpersonal Relationship;Meal Prep;Medication Management;Laundry;Shop;Volunteer;Yard Work    [employer]  Evolving/Moderate complexity    Rehab Potential  Fair    PT Frequency  2x / week    PT Treatment/Interventions  ADLs/Self Care Home Management;Aquatic Therapy;Cryotherapy;Biofeedback;Electrical Stimulation;Iontophoresis 67m/ml Dexamethasone;Moist Heat;Traction;Ultrasound;Functional mobility training;Stair training;Gait training;DME Instruction;Therapeutic activities;Therapeutic exercise;Balance training;Neuromuscular re-education;Orthotic Fit/Training;Patient/family education;Cognitive remediation;Wheelchair mobility training;Manual techniques;Taping;Splinting;Energy conservation    PT Next Visit Plan  give HEP, practice SPT, ambulation, COM    PT Home Exercise Plan  Reviewed LAQs and alt hip flexion with pt today; pt able to verbalize and demonstrate understanding       Patient will benefit from skilled therapeutic intervention in order to improve the following deficits and impairments:  Abnormal gait, Decreased activity tolerance, Decreased coordination, Decreased balance, Decreased endurance, Decreased knowledge of precautions, Decreased knowledge of use of DME, Decreased safety awareness, Decreased range of motion, Decreased mobility, Decreased strength, Difficulty walking, Hypomobility, Increased fascial restricitons,  Impaired flexibility, Impaired perceived functional ability, Impaired sensation, Impaired tone, Impaired UE functional use, Postural dysfunction, Improper body mechanics, Pain, Obesity  Visit Diagnosis: Muscle weakness (generalized)  Other lack of coordination  Collagen vascular disease ([facility])  Other abnormalities of gait and mobility     Problem List Patient Active Problem List   Diagnosis Date Noted  . [place] vascular disease ([place] 05/21/2017  . Gangrene of finger of right hand ([facility] 05/21/2017    MAlanson Puls PT DPT 06/23/2019, 11:24 AM  [place] [facility] [address] [address]Phone: [phone]  Fax:  [phone] Name: Ariel VISSCHERMRN: [id number] of Birth: 101/02/1946

## 2019-06-25 ENCOUNTER — Other Ambulatory Visit: Payer: Self-pay

## 2019-06-25 ENCOUNTER — Ambulatory Visit: Payer: Medicare Other | Admitting: Physical Therapy

## 2019-06-25 ENCOUNTER — Encounter: Payer: Medicare Other | Admitting: Occupational Therapy

## 2019-06-25 ENCOUNTER — Ambulatory Visit: Payer: Medicare Other | Admitting: Occupational Therapy

## 2019-06-25 ENCOUNTER — Encounter: Payer: Self-pay | Admitting: Physical Therapy

## 2019-06-25 DIAGNOSIS — M6281 Muscle weakness (generalized): Secondary | ICD-10-CM

## 2019-06-25 DIAGNOSIS — R278 Other lack of coordination: Secondary | ICD-10-CM

## 2019-06-25 DIAGNOSIS — R2689 Other abnormalities of gait and mobility: Secondary | ICD-10-CM

## 2019-06-25 NOTE — Therapy (Signed)
[place] MAIN [facility] SERVICES [address] [place], [place], [address] Phone: [phone]   Fax:  [phone]  Physical Therapy Treatment  Patient Details  Name: Ariel Alvarez MRN: [id number] Date of Birth: Nov 18, 1945 Referring Provider (PT): Okey Regal   Encounter Date: 06/25/2019  PT End of Session - 06/25/19 0857    Visit Number  18    Number of Visits  33    Date for PT Re-Evaluation  08/05/19    PT Start Time  0801    PT Stop Time  0845    PT Time Calculation (min)  44 min    Equipment Utilized During Treatment  Gait belt;Other (comment)   Hemiwalker   Activity Tolerance  Patient limited by fatigue;Other (comment)    Behavior During Therapy  [facility] for tasks assessed/performed;Anxious       Past Medical History:  Diagnosis Date  . Anxiety   . Collagen vascular disease ([place])   . Depression   . Stroke [facility])     Past Surgical History:  Procedure Laterality Date  . CHOLECYSTECTOMY      There were no vitals filed for this visit.  Subjective Assessment - 06/25/19 0856    Subjective  Patient reports that her feet are much better today, no pain.    Patient is accompained by:  Family member    Pertinent History  Patient presents for instability s/p CVA in 01/2017. Patient was recently diagnosed with osteoporosis in bilateral legs and feet. Is potentially getting surgery in 2 weeks on both feet. After discharge from outpatient physical therapy had one session with home health PT but no other therapy. Reports no falls. Reports walking from living room to kitchen and back to walkway however her LLE gives out frequently . Per husband and patient she was going to an adult day center prior to [place]. Patient has a flat/level entry. Trying to get a power chair at this time from [employer]. Has a manual chair at this time, walker, hemi walker, quad cane. Doing bird baths but has a walk in tub she can't get in, does have a  shower chair. Patient and husband aware that patient will need caregiver with her due to toileting needs.    Limitations  Sitting;Lifting;Standing;Walking;House hold activities    How long can you sit comfortably?  recliner 5 hours, 30 minute sin wheelchair    How long can you stand comfortably?  4 minutes with UE support    How long can you walk comfortably?  15 ft    Patient Stated Goals  to walk better    Currently in Pain?  No/denies    Pain Score  0-No pain    Pain Onset  More than a month ago         Treatment  Transfer <>multi level surfaces <>nu-step withCGAassist x4transfers , Patients performance improves with practice and cues to lean fwd improves quality of transfer  Transfers sit to standx 5with hemiwalker andCGAassist and Vc for safety and sequencing, patient improves with practice  PROM to left heel cord and left knee hamstring x 30 sec x 3  LAQ BLE x 10 x 2   Hip flex x 10 x 2  Gait training with hemi walker41fet x4, wc following, CGA and VC for step length and posture  Standing and weight shifting left and right x 3 mins x 2 reps   Pt educated about posture and technique with exercises to improve exercise technique,  movement at target joints, with min verbal, visual, tactile cues                       PT Education - 06/25/19 0856    Education Details  HEP    [name]  Patient    Methods  Explanation    Comprehension  Verbalized understanding;Returned demonstration;Verbal cues required       PT Short Term Goals - 05/21/19 1446      PT SHORT TERM GOAL #1   Title  Patient will perform one sit to stand independently from wheelchair to increase independence with transfers    Baseline  6/17: min/mod A    Time  2    Period  Weeks    Status  Partially Met    Target Date  04/29/19      PT SHORT TERM GOAL #2   Title  Patient will be independent in home exercise program to improve strength/mobility  for better functional independence with ADLs.    Baseline  HEP to be given next session    Time  2    Status  Partially Met    Target Date  04/29/19        PT Long Term Goals - 05/21/19 1446      PT LONG TERM GOAL #1   Title  Patient will perform 5 STS < 40 seconds independently with hemi walker to demonstrate improved home mobility.    Baseline  6/17: unable to perform one time without Min/Mod A, 05/21/19 1.06 sec    Time  8    Period  Weeks    Status  Partially Met    Target Date  06/10/19      PT LONG TERM GOAL #2   Title  Patient will stand without UE support for 3-5 minutes for ADL performance without LOB to increase independence with dressing and toileting.    Baseline  6/17: unable to stand without UE support, 05/21/19 1 min 30 sec    Time  8    Period  Weeks    Status  New    Target Date  06/10/19      PT LONG TERM GOAL #3   Title  Patient will ambulate 60 ft with hemi walker and CGA to improve home mobility and independence with gait.     Baseline  6/17: 10 ft, 05/21/19 17 feet    Time  9    Period  Weeks    Status  Partially Met    Target Date  06/10/19      PT LONG TERM GOAL #4   Title  Patient will increase BLE gross strength to 4+/5 as to improve functional strength for independent gait, increased standing tolerance and increased ADL ability    Baseline  6/17: hips grossly 2+/5 knees 3+/5    Time  8    Period  Weeks    Status  Partially Met    Target Date  06/10/19            Plan - 06/25/19 0941    Clinical Impression Statement  Patient demonstrates improved stability and strength allowing patient to perform short duration standing interventions with rest periods.  Patient performs gait with cues to shift. Patient fatigues quickly with exercises requiring rest breaks at this time. Patient will continue to benefit from skilled physical therapy to improve pain and mobility.    Personal Factors and Comorbidities  Behavior Pattern;Comorbidity  3+;Fitness;Past/Current Experience;Social  Background;Time since onset of injury/illness/exacerbation;Transportation    Comorbidities  anxiety, collagen vascular disease, depression, stroke, amputated digit    Examination-Activity Limitations  Bathing;Bed Mobility;Bend;Caring for Others;Carry;Continence;Dressing;Sleep;Sit;Reach Overhead;Locomotion Level;Lift;Hygiene/Grooming;Squat;Stairs;Stand;Toileting;Transfers    Examination-Participation Restrictions  Church;Cleaning;Community Activity;Driving;Interpersonal Relationship;Meal Prep;Medication Management;Laundry;Shop;Volunteer;Yard Work    [employer]  Evolving/Moderate complexity    Rehab Potential  Fair    PT Frequency  2x / week    PT Treatment/Interventions  ADLs/Self Care Home Management;Aquatic Therapy;Cryotherapy;Biofeedback;Electrical Stimulation;Iontophoresis 26m/ml Dexamethasone;Moist Heat;Traction;Ultrasound;Functional mobility training;Stair training;Gait training;DME Instruction;Therapeutic activities;Therapeutic exercise;Balance training;Neuromuscular re-education;Orthotic Fit/Training;Patient/family education;Cognitive remediation;Wheelchair mobility training;Manual techniques;Taping;Splinting;Energy conservation    PT Next Visit Plan  give HEP, practice SPT, ambulation, COM    PT Home Exercise Plan  Reviewed LAQs and alt hip flexion with pt today; pt able to verbalize and demonstrate understanding       Patient will benefit from skilled therapeutic intervention in order to improve the following deficits and impairments:  Abnormal gait, Decreased activity tolerance, Decreased coordination, Decreased balance, Decreased endurance, Decreased knowledge of precautions, Decreased knowledge of use of DME, Decreased safety awareness, Decreased range of motion, Decreased mobility, Decreased strength, Difficulty walking, Hypomobility, Increased fascial restricitons, Impaired flexibility, Impaired perceived functional ability,  Impaired sensation, Impaired tone, Impaired UE functional use, Postural dysfunction, Improper body mechanics, Pain, Obesity  Visit Diagnosis: Muscle weakness (generalized)  Other lack of coordination  Other abnormalities of gait and mobility     Problem List Patient Active Problem List   Diagnosis Date Noted  . Collagen vascular disease ([place] 05/21/2017  . Gangrene of finger of right hand ([facility] 05/21/2017    MAlanson Puls PVirginiaDPT 06/25/2019, 9:43 AM  [place] [facility] [address] [address]Phone: [phone]  Fax:  [phone] Name: SVALYNN SCHAMBERGERMRN: [id number] of Birth: 1December 30, 1947

## 2019-06-25 NOTE — Therapy (Signed)
[facility] MAIN [facility] SERVICES [address] [place], [place], [address] Phone: [phone]   Fax:  [phone]  Occupational Therapy Treatment  Patient Details  Name: NANAMI WHITELAW MRN: [id number] Date of Birth: [birth year] No data recorded  Encounter Date: 06/25/2019  OT End of Session - 06/25/19 1109    Visit Number  6    Number of Visits  24    Date for OT Re-Evaluation  08/18/19    OT Start Time  0845    OT Stop Time  0930    OT Time Calculation (min)  45 min    Activity Tolerance  Patient tolerated treatment well    Behavior During Therapy  [facility] for tasks assessed/performed;Anxious       Past Medical History:  Diagnosis Date  . Anxiety   . Collagen vascular disease ([facility])   . Depression   . Stroke [facility])     Past Surgical History:  Procedure Laterality Date  . CHOLECYSTECTOMY      There were no vitals filed for this visit.  Subjective Assessment - 06/25/19 0851    Subjective   Pt reports pain in legs is better since she started taking gabapentin again.    Patient is accompanied by:  Family member    Pertinent History  Pt. is a 73 y.o. female who sustained multiple CVAs with left hemiplegia, Pt. PMHx includes: Depression, Collagen Vascular Disease, Anxiety. Pt. recieved therapy services in thre past. Pt. resides at home with her husband who is supportive, and assists with her care. Pt. is alone during the day, as her husband works.    Patient Stated Goals  To be stronger, be able to take care of myself while my husband works.    Currently in Pain?  Yes    Pain Score  2     Pain Location  Foot    Pain Orientation  Right;Left    Pain Descriptors / Indicators  Tingling    Pain Type  Chronic pain    Pain Radiating Towards  none    Pain Onset  More than a month ago    Pain Frequency  Intermittent    Aggravating Factors   standing and walking    Pain Relieving Factors  stretching, massage, heat    Effect of Pain on Daily  Activities  limits walking and standing for ADLs    Multiple Pain Sites  No       Neuro muscular re-education:  Pt. worked on right hand [facility] skills using hand gripper with resiistve spring in second slot with wooden pegs placed on R side and in front to work on reaching and strengthening with control of movement using R hand.  She needed a rest break in between sets due to tightness and fatigue in R hand.   Pt. worked on grasping 1" resistive cubes alternating thumb opposition to the tip of the 2nd digit while the board is placed at a vertical angle. Pt. worked on pressing the cubes back into place while alternating isolated 2nd digit extension.  Therapeutic Exercise:  Pt. Tolerated PROM in all joint ranges of the LUE, and hand. ROM was performed within pain free range. Pt. Tolerated prolonged slow, gentle stretching of the left hand, and digits secondary to increased flexor tone, and tightness. Discussed need to new resting hand splint which will be further addressed at next treatment session.  Recommend a splint that is adjustable since she has tight flexion in MCPs, DIPs  and PIPs of L hand. Muscle tone decreased this session after PROM which may be due to patient starting on gabapentin again for bilateral foot pain.                     OT Education - 06/25/19 1109    Education Details  HEP, strength, fine motor, ROM    [name]  Patient    Methods  Explanation;Demonstration;Verbal cues    Comprehension  Verbal cues required;Returned demonstration;Verbalized understanding          OT Long Term Goals - 05/26/19 1723      OT LONG TERM GOAL #1   Title  Pt. will improve RUE strength to be be able to reach into cabinetry to put items away without compensating posteriorly with her trunk.    Baseline  05/26/2019: Pt. leans posteriorly when reaching with her RUE    Time  12    Period  Weeks    Status  New    Target Date  08/18/19      OT LONG TERM GOAL #2    Title  Pt. will independently complete light meal preparation in standing    Baseline  05/26/2019: Pt. has difficulty    Time  12    Period  Weeks    Status  New    Target Date  08/18/19      OT LONG TERM GOAL #3   Title  Pt. will improve right hand [facility] skills to be able to independently apply hook earrings.    Baseline  05/26/2019: R: 1 min &1 sec. Pt. is unable to place earrings.    Time  12    Period  Weeks    Status  New    Target Date  08/18/19      OT LONG TERM GOAL #4   Title  Pt. will improve right grip strength to be able to hold a can at the can opener    Baseline  05/26/2019: R: 15#. Pt. is unable to hold, and use a can opener    Time  12    Period  Weeks    Status  New    Target Date  08/18/19      OT LONG TERM GOAL #5   Title  Pt. will require supervision to sweep the floor.    Baseline  05/26/2019: Pt. is unable    Time  12    Period  Weeks    Status  New    Target Date  08/18/19            Plan - 06/25/19 1110    Clinical Impression Statement  Pt. continues to present with increased flexor tone, and tightness through the LUE, and hand. Pt. is tolerating slow gentle PROM stretching well. Pt. continues to work on improving right hand strength, and coordination skills, and improve ROM in the LUE.She had less hand tremors with 1# weight on R wrist during fine motor task but fatigued pretty quickly and would benefit from trying 1/2# weight instead next session.    OT Occupational Profile and History  Comprehensive Assessment- Review of records and extensive additional review of physical, cognitive, psychosocial history related to current functional performance    Occupational performance deficits (Please refer to evaluation for details):  ADL's;IADL's    Body Structure / Function / Physical Skills  ADL;IADL;FMC;ROM;Pain;UE functional use;Strength;Dexterity    Rehab Potential  Good    Clinical Decision Making  Several treatment  options, min-mod task modification  necessary    Comorbidities Affecting Occupational Performance:  Presence of comorbidities impacting occupational performance    Comorbidities impacting occupational performance description:  histroy of strokes, obesity, decreased balance    Modification or Assistance to Complete Evaluation   Min-Moderate modification of tasks or assist with assess necessary to complete eval    OT Frequency  2x / week    OT Duration  12 weeks    OT Treatment/Interventions  Self-care/ADL training;Neuromuscular education;Manual Therapy;Therapeutic activities;Therapeutic exercise;Patient/family education    Consulted and Agree with Plan of Care  Patient       Patient will benefit from skilled therapeutic intervention in order to improve the following deficits and impairments:   Body Structure / Function / Physical Skills: ADL, IADL, FMC, ROM, Pain, UE functional use, Strength, Dexterity       Visit Diagnosis: Muscle weakness (generalized)  Other lack of coordination    Problem List Patient Active Problem List   Diagnosis Date Noted  . Collagen vascular disease ([place]) 05/21/2017  . Gangrene of finger of right hand [facility]) 05/21/2017   Chrys Racer, OTR/L, [facility] [phone] 06/25/19, 11:21 AM  [place] MAIN [facility] SERVICES [address] [place], [place], [address] Phone: [phone]   Fax:  [phone]  Name: ROKHAYA QUINN MRN: [id number] Date of Birth: October 11, 1946

## 2019-06-30 ENCOUNTER — Encounter: Payer: Self-pay | Admitting: Physical Therapy

## 2019-06-30 ENCOUNTER — Other Ambulatory Visit: Payer: Self-pay

## 2019-06-30 ENCOUNTER — Encounter: Payer: Self-pay | Admitting: Occupational Therapy

## 2019-06-30 ENCOUNTER — Ambulatory Visit: Payer: Medicare Other | Attending: Internal Medicine | Admitting: Physical Therapy

## 2019-06-30 ENCOUNTER — Ambulatory Visit: Payer: Medicare Other | Admitting: Occupational Therapy

## 2019-06-30 DIAGNOSIS — M6281 Muscle weakness (generalized): Secondary | ICD-10-CM

## 2019-06-30 DIAGNOSIS — R2689 Other abnormalities of gait and mobility: Secondary | ICD-10-CM

## 2019-06-30 DIAGNOSIS — M359 Systemic involvement of connective tissue, unspecified: Secondary | ICD-10-CM | POA: Diagnosis present

## 2019-06-30 DIAGNOSIS — R278 Other lack of coordination: Secondary | ICD-10-CM

## 2019-06-30 NOTE — Therapy (Signed)
[place] MAIN [facility] SERVICES [address] [place], [place], [address] Phone: [phone]   Fax:  [phone]  Physical Therapy Treatment  Patient Details  Name: Ariel Alvarez MRN: [id number] Date of Birth: [birth year] Referring Provider (PT): Okey Regal   Encounter Date: 06/30/2019  PT End of Session - 06/30/19 0903    Visit Number  19    Number of Visits  33    Date for PT Re-Evaluation  08/05/19    PT Start Time  0800    PT Stop Time  0845    PT Time Calculation (min)  45 min    Equipment Utilized During Treatment  Gait belt;Other (comment)   Hemiwalker   Activity Tolerance  Patient limited by fatigue;Other (comment)    Behavior During Therapy  [facility] for tasks assessed/performed;Anxious       Past Medical History:  Diagnosis Date  . Anxiety   . Collagen vascular disease ([place])   . Depression   . Stroke [facility])     Past Surgical History:  Procedure Laterality Date  . CHOLECYSTECTOMY      There were no vitals filed for this visit.  Subjective Assessment - 06/30/19 0901    Subjective  Patient reports that her feet are much better today, no pain.    Patient is accompained by:  Family member    Pertinent History  Patient presents for instability s/p CVA in 01/2017. Patient was recently diagnosed with osteoporosis in bilateral legs and feet. Is potentially getting surgery in 2 weeks on both feet. After discharge from outpatient physical therapy had one session with home health PT but no other therapy. Reports no falls. Reports walking from living room to kitchen and back to walkway however her LLE gives out frequently . Per husband and patient she was going to an adult day center prior to [place]. Patient has a flat/level entry. Trying to get a power chair at this time from [employer]. Has a manual chair at this time, walker, hemi walker, quad cane. Doing bird baths but has a walk in tub she can't get in, does have a  shower chair. Patient and husband aware that patient will need caregiver with her due to toileting needs.    Limitations  Sitting;Lifting;Standing;Walking;House hold activities    How long can you sit comfortably?  recliner 5 hours, 30 minute sin wheelchair    How long can you stand comfortably?  4 minutes with UE support    How long can you walk comfortably?  15 ft    Patient Stated Goals  to walk better    Currently in Pain?  No/denies    Pain Score  0-No pain    Pain Onset  More than a month ago       Treatment:    Treatment  Transfer <>multi level surfaces <>nu-step withCGAassist x4transfers , Patients performance improves with practice and cues to lean fwd improves quality of transfer  Transfers sit to standx 5with hemiwalker andCGAassist and Vc for safety and sequencing, patient improves with practice  PROM to left heel cord and left knee hamstring x 30 sec x 3,mod VCs for positioning to isolate calf strengthening  LAQ BLE x 10 x 2 ,Patient required min-moderate verbal/tactile cues for correct exercise technique.  Hip flex x 10 x 2,Patient required min-moderate verbal/tactile cues for correct exercise technique.  Gait training with hemi walker45, 40, 54fet , wc following, CGA and VC for step length and posture, cues  to avoid hip flexion but isolate knee extension for better stretch in hamstring;  Standing and weight shifting left and right x32mns x2reps,Patient required min-moderate verbal/tactile cues for correct exercise technique.   Pt educated about posture and technique with exercises to improve exercise technique, movement at target joints, with min verbal, visual, tactile cues                       PT Education - 06/30/19 0902    Education Details  hep    [name]  Patient    Methods  Explanation    Comprehension  Verbalized understanding;Returned demonstration;Verbal cues required       PT Short  Term Goals - 05/21/19 1446      PT SHORT TERM GOAL #1   Title  Patient will perform one sit to stand independently from wheelchair to increase independence with transfers    Baseline  6/17: min/mod A    Time  2    Period  Weeks    Status  Partially Met    Target Date  04/29/19      PT SHORT TERM GOAL #2   Title  Patient will be independent in home exercise program to improve strength/mobility for better functional independence with ADLs.    Baseline  HEP to be given next session    Time  2    Status  Partially Met    Target Date  04/29/19        PT Long Term Goals - 05/21/19 1446      PT LONG TERM GOAL #1   Title  Patient will perform 5 STS < 40 seconds independently with hemi walker to demonstrate improved home mobility.    Baseline  6/17: unable to perform one time without Min/Mod A, 05/21/19 1.06 sec    Time  8    Period  Weeks    Status  Partially Met    Target Date  06/10/19      PT LONG TERM GOAL #2   Title  Patient will stand without UE support for 3-5 minutes for ADL performance without LOB to increase independence with dressing and toileting.    Baseline  6/17: unable to stand without UE support, 05/21/19 1 min 30 sec    Time  8    Period  Weeks    Status  New    Target Date  06/10/19      PT LONG TERM GOAL #3   Title  Patient will ambulate 60 ft with hemi walker and CGA to improve home mobility and independence with gait.     Baseline  6/17: 10 ft, 05/21/19 17 feet    Time  9    Period  Weeks    Status  Partially Met    Target Date  06/10/19      PT LONG TERM GOAL #4   Title  Patient will increase BLE gross strength to 4+/5 as to improve functional strength for independent gait, increased standing tolerance and increased ADL ability    Baseline  6/17: hips grossly 2+/5 knees 3+/5    Time  8    Period  Weeks    Status  Partially Met    Target Date  06/10/19            Plan - 06/30/19 00092   Clinical Impression Statement  Pt requires direction and  verbal cues for correct performance of gait and strengthening exercises. Patient has fatigue  with endurance and difficulty with left LE with fatigue during side stepping in gait.  Patient struggles with gait instability on even surfaces due to weakness and fatigue. Pt encouraged continuing HEP   Patient will benefit from continued skilled PT to improve mobility and safety.    Personal Factors and Comorbidities  Behavior Pattern;Comorbidity 3+;Fitness;Past/Current Experience;Social Background;Time since onset of injury/illness/exacerbation;Transportation    Comorbidities  anxiety, collagen vascular disease, depression, stroke, amputated digit    Examination-Activity Limitations  Bathing;Bed Mobility;Bend;Caring for Others;Carry;Continence;Dressing;Sleep;Sit;Reach Overhead;Locomotion Level;Lift;Hygiene/Grooming;Squat;Stairs;Stand;Toileting;Transfers    Examination-Participation Restrictions  Church;Cleaning;Community Activity;Driving;Interpersonal Relationship;Meal Prep;Medication Management;Laundry;Shop;Volunteer;Yard Work    [employer]  Evolving/Moderate complexity    Rehab Potential  Fair    PT Frequency  2x / week    PT Treatment/Interventions  ADLs/Self Care Home Management;Aquatic Therapy;Cryotherapy;Biofeedback;Electrical Stimulation;Iontophoresis 42m/ml Dexamethasone;Moist Heat;Traction;Ultrasound;Functional mobility training;Stair training;Gait training;DME Instruction;Therapeutic activities;Therapeutic exercise;Balance training;Neuromuscular re-education;Orthotic Fit/Training;Patient/family education;Cognitive remediation;Wheelchair mobility training;Manual techniques;Taping;Splinting;Energy conservation    PT Next Visit Plan  give HEP, practice SPT, ambulation, COM    PT Home Exercise Plan  Reviewed LAQs and alt hip flexion with pt today; pt able to verbalize and demonstrate understanding       Patient will benefit from skilled therapeutic intervention in order to  improve the following deficits and impairments:  Abnormal gait, Decreased activity tolerance, Decreased coordination, Decreased balance, Decreased endurance, Decreased knowledge of precautions, Decreased knowledge of use of DME, Decreased safety awareness, Decreased range of motion, Decreased mobility, Decreased strength, Difficulty walking, Hypomobility, Increased fascial restricitons, Impaired flexibility, Impaired perceived functional ability, Impaired sensation, Impaired tone, Impaired UE functional use, Postural dysfunction, Improper body mechanics, Pain, Obesity  Visit Diagnosis: Muscle weakness (generalized)  Other lack of coordination  Other abnormalities of gait and mobility  Collagen vascular disease ([place]     Problem List Patient Active Problem List   Diagnosis Date Noted  . Collagen vascular disease ([place] 05/21/2017  . Gangrene of finger of right hand ([facility] 05/21/2017    MAlanson Puls PVirginiaDPT 06/30/2019, 9:23 AM  [place] [facility] [address] [address]Phone: [phone]  Fax:  [phone] Name: Ariel BROBERGMRN: [id number] of Birth: 104-08-1946

## 2019-06-30 NOTE — Therapy (Signed)
[place] MAIN [facility] SERVICES [address] [place], [place], [address] Phone: [phone]   Fax:  [phone]  Occupational Therapy Treatment  Patient Details  Name: Ariel Alvarez MRN: [id number] Date of Birth: 11-28-1945 No data recorded  Encounter Date: 06/30/2019  OT End of Session - 06/30/19 1035    Visit Number  7    Number of Visits  24    Date for OT Re-Evaluation  08/18/19    OT Start Time  0845    OT Stop Time  0930    OT Time Calculation (min)  45 min    Equipment Utilized During Treatment  adaptive writing aids, dycem    Activity Tolerance  Patient tolerated treatment well    Behavior During Therapy  [facility] for tasks assessed/performed;Anxious       Past Medical History:  Diagnosis Date  . Anxiety   . Collagen vascular disease ([place])   . Depression   . Stroke [facility])     Past Surgical History:  Procedure Laterality Date  . CHOLECYSTECTOMY      There were no vitals filed for this visit.  Subjective Assessment - 06/30/19 1033    Subjective   Pt. reports having an appointment this week with the podiatrist about her bunions.    Patient is accompanied by:  Family member    Pertinent History  Pt. is a 73 y.o. female who sustained multiple CVAs with left hemiplegia, Pt. PMHx includes: Depression, Collagen Vascular Disease, Anxiety. Pt. recieved therapy services in thre past. Pt. resides at home with her husband who is supportive, and assists with her care. Pt. is alone during the day, as her husband works.    Patient Stated Goals  To be stronger, be able to take care of myself while my husband works.    Currently in Pain?  No/denies      OT TREATMENT    Pt. education was provided about resting hand splints for the left hand. Pt., and her husband were provided with information about the recommended splints.   Therapeutic Exercise:  Pt. tolerated PROM in the LUE for shoulder flexion, abduction, elbow flexion, extension,  forearm supination, wrist extension, flexion, digit MP, PIP, and DIP flexion, extension, thumb abduction, and thumb PIP extension. Pt. had limited tolerance for shoulder flexion, and abduction today. Pt. education was provided about self-ROM with visual demonstration, and tactile cues. Pt. education was provided about LUE positioning.                         OT Education - 06/30/19 1035    Education Details  HEP, strength, fine motor, ROM    [name]  Patient    Methods  Explanation;Demonstration;Verbal cues    Comprehension  Verbal cues required;Returned demonstration;Verbalized understanding          OT Long Term Goals - 05/26/19 1723      OT LONG TERM GOAL #1   Title  Pt. will improve RUE strength to be be able to reach into cabinetry to put items away without compensating posteriorly with her trunk.    Baseline  05/26/2019: Pt. leans posteriorly when reaching with her RUE    Time  12    Period  Weeks    Status  New    Target Date  08/18/19      OT LONG TERM GOAL #2   Title  Pt. will independently complete light meal preparation in standing  Baseline  05/26/2019: Pt. has difficulty    Time  12    Period  Weeks    Status  New    Target Date  08/18/19      OT LONG TERM GOAL #3   Title  Pt. will improve right hand [facility] skills to be able to independently apply hook earrings.    Baseline  05/26/2019: R: 1 min &1 sec. Pt. is unable to place earrings.    Time  12    Period  Weeks    Status  New    Target Date  08/18/19      OT LONG TERM GOAL #4   Title  Pt. will improve right grip strength to be able to hold a can at the can opener    Baseline  05/26/2019: R: 15#. Pt. is unable to hold, and use a can opener    Time  12    Period  Weeks    Status  New    Target Date  08/18/19      OT LONG TERM GOAL #5   Title  Pt. will require supervision to sweep the floor.    Baseline  05/26/2019: Pt. is unable    Time  12    Period  Weeks    Status  New     Target Date  08/18/19            Plan - 06/30/19 1035    OT Occupational Profile and History  Comprehensive Assessment- Review of records and extensive additional review of physical, cognitive, psychosocial history related to current functional performance    Occupational performance deficits (Please refer to evaluation for details):  ADL's;IADL's    Body Structure / Function / Physical Skills  ADL;IADL;FMC;ROM;Pain;UE functional use;Strength;Dexterity    Rehab Potential  Good    Clinical Decision Making  Several treatment options, min-mod task modification necessary    Comorbidities Affecting Occupational Performance:  Presence of comorbidities impacting occupational performance    Comorbidities impacting occupational performance description:  histroy of strokes, obesity, decreased balance    Modification or Assistance to Complete Evaluation   Min-Moderate modification of tasks or assist with assess necessary to complete eval    OT Frequency  2x / week    OT Duration  12 weeks    OT Treatment/Interventions  Self-care/ADL training;Neuromuscular education;Manual Therapy;Therapeutic activities;Therapeutic exercise;Patient/family education       Patient will benefit from skilled therapeutic intervention in order to improve the following deficits and impairments:   Body Structure / Function / Physical Skills: ADL, IADL, FMC, ROM, Pain, UE functional use, Strength, Dexterity       Visit Diagnosis: Muscle weakness (generalized)  Other lack of coordination    Problem List Patient Active Problem List   Diagnosis Date Noted  . Collagen vascular disease ([facility]) 05/21/2017  . 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) 05/21/2017    Olegario Messier, MS, OTR/L 06/30/2019, 10:37 AM  [place] [facility] MAIN [facility] SERVICES [address] [place], [place], [address] Phone: [phone]   Fax:  [phone]  Name: SHRUTHI NORTHRUP MRN: [id number] Date of  Birth: 04-Aug-1946

## 2019-07-02 ENCOUNTER — Ambulatory Visit: Payer: Medicare Other | Admitting: Physical Therapy

## 2019-07-02 ENCOUNTER — Other Ambulatory Visit: Payer: Self-pay

## 2019-07-02 ENCOUNTER — Encounter: Payer: Self-pay | Admitting: Occupational Therapy

## 2019-07-02 ENCOUNTER — Ambulatory Visit: Payer: Medicare Other | Admitting: Occupational Therapy

## 2019-07-02 ENCOUNTER — Encounter: Payer: Self-pay | Admitting: Physical Therapy

## 2019-07-02 DIAGNOSIS — M6281 Muscle weakness (generalized): Secondary | ICD-10-CM

## 2019-07-02 DIAGNOSIS — R2689 Other abnormalities of gait and mobility: Secondary | ICD-10-CM

## 2019-07-02 DIAGNOSIS — R278 Other lack of coordination: Secondary | ICD-10-CM

## 2019-07-02 DIAGNOSIS — M359 Systemic involvement of connective tissue, unspecified: Secondary | ICD-10-CM

## 2019-07-02 NOTE — Therapy (Signed)
[place] [facility] MAIN [facility] SERVICES [address] [place], [place], [address] Phone: [phone]   Fax:  [phone]  Occupational Therapy Treatment  Patient Details  Name: Ariel Alvarez MRN: [id number] Date of Birth: August 29, 1946 No data recorded  Encounter Date: 07/02/2019  OT End of Session - 07/02/19 0926    Visit Number  8    Number of Visits  24    Date for OT Re-Evaluation  08/18/19    OT Start Time  0835    OT Stop Time  0915    OT Time Calculation (min)  40 min    Equipment Utilized During Treatment  adaptive writing aids, dycem    Activity Tolerance  Patient tolerated treatment well    Behavior During Therapy  [facility] for tasks assessed/performed;Anxious       Past Medical History:  Diagnosis Date  . Anxiety   . Collagen vascular disease ([facility])   . Depression   . Stroke [facility])     Past Surgical History:  Procedure Laterality Date  . CHOLECYSTECTOMY      There were no vitals filed for this visit.  Subjective Assessment - 07/02/19 0924    Patient is accompanied by:  Family member    Pertinent History  Pt. is a 73 y.o. female who sustained multiple CVAs with left hemiplegia, Pt. PMHx includes: Depression, Collagen Vascular Disease, Anxiety. Pt. recieved therapy services in thre past. Pt. resides at home with her husband who is supportive, and assists with her care. Pt. is alone during the day, as her husband works.    Patient Stated Goals  To be stronger, be able to take care of myself while my husband works.    Currently in Pain?  No/denies   Reports tightness     OT TREATMENT    Therapeutic Exercise:  Pt. tolerated PROM in the LUE for shoulder flexion, abduction, elbow flexion, extension, forearm supination, wrist extension, flexion, digit MP, PIP, and DIP flexion, extension, thumb abduction, and thumb PIP extension. Pt. had limited tolerance for shoulder flexion, and abduction today. Pt. education was provided about self-ROM  with visual demonstration, and tactile cues. Pt. education was provided about LUE positioning.                         OT Education - 07/02/19 0926    Education Details  HEP, strength, fine motor, ROM    [name]  Patient    Methods  Explanation;Demonstration;Verbal cues    Comprehension  Verbal cues required;Returned demonstration;Verbalized understanding          OT Long Term Goals - 05/26/19 1723      OT LONG TERM GOAL #1   Title  Pt. will improve RUE strength to be be able to reach into cabinetry to put items away without compensating posteriorly with her trunk.    Baseline  05/26/2019: Pt. leans posteriorly when reaching with her RUE    Time  12    Period  Weeks    Status  New    Target Date  08/18/19      OT LONG TERM GOAL #2   Title  Pt. will independently complete light meal preparation in standing    Baseline  05/26/2019: Pt. has difficulty    Time  12    Period  Weeks    Status  New    Target Date  08/18/19      OT LONG TERM GOAL #  3   Title  Pt. will improve right hand [facility] skills to be able to independently apply hook earrings.    Baseline  05/26/2019: R: 1 min &1 sec. Pt. is unable to place earrings.    Time  12    Period  Weeks    Status  New    Target Date  08/18/19      OT LONG TERM GOAL #4   Title  Pt. will improve right grip strength to be able to hold a can at the can opener    Baseline  05/26/2019: R: 15#. Pt. is unable to hold, and use a can opener    Time  12    Period  Weeks    Status  New    Target Date  08/18/19      OT LONG TERM GOAL #5   Title  Pt. will require supervision to sweep the floor.    Baseline  05/26/2019: Pt. is unable    Time  12    Period  Weeks    Status  New    Target Date  08/18/19            Plan - 07/02/19 0926    Clinical Impression Statement  Pt.refers to her LUE as "she", and "her". Pt. presents with increased flexor tone, and tightness throughout the LUE, and hand. Pt. reports  increased tightness yesterday which she reports was painful. Pt. with less flexor tone, and tightness today. Pt. continues to work on improving LUE ROM, and RUE functioning during ADLs, and IADL tasks. Plan to measure pt. for a left hand splint next session.    OT Occupational Profile and History  Comprehensive Assessment- Review of records and extensive additional review of physical, cognitive, psychosocial history related to current functional performance    Occupational performance deficits (Please refer to evaluation for details):  ADL's;IADL's    Body Structure / Function / Physical Skills  ADL;IADL;FMC;ROM;Pain;UE functional use;Strength;Dexterity    Rehab Potential  Good    Clinical Decision Making  Several treatment options, min-mod task modification necessary    Comorbidities Affecting Occupational Performance:  Presence of comorbidities impacting occupational performance    Comorbidities impacting occupational performance description:  histroy of strokes, obesity, decreased balance    Modification or Assistance to Complete Evaluation   Min-Moderate modification of tasks or assist with assess necessary to complete eval    OT Frequency  2x / week    OT Duration  12 weeks    OT Treatment/Interventions  Self-care/ADL training;Neuromuscular education;Manual Therapy;Therapeutic activities;Therapeutic exercise;Patient/family education    OT Home Exercise Plan  Self ROM exercises tabletop    Consulted and Agree with Plan of Care  Patient       Patient will benefit from skilled therapeutic intervention in order to improve the following deficits and impairments:   Body Structure / Function / Physical Skills: ADL, IADL, FMC, ROM, Pain, UE functional use, Strength, Dexterity       Visit Diagnosis: Muscle weakness (generalized)  Other lack of coordination    Problem List Patient Active Problem List   Diagnosis Date Noted  . Collagen vascular disease ([place]) 05/21/2017  . Gangrene of  finger of right hand ([place]) 05/21/2017    Harrel Carina, MS, OTR/L 07/02/2019, 9:33 AM  [place] MAIN [facility] SERVICES [address] [place], [place], [address] Phone: [phone]   Fax:  [phone]  Name: Ariel Alvarez MRN: [id number] Date of Birth: 28-Aug-1946

## 2019-07-02 NOTE — Therapy (Signed)
[place] MAIN [facility] SERVICES [address] [place], [place], [address] Phone: [phone]   Fax:  [phone]  Physical Therapy Treatment / Physical Therapy Progress Note   Dates of reporting period 05/21/19  to   07/02/19  Patient Details  Name: RHYS ANCHONDO MRN: [id number] Date of Birth: 03/02/1946 Referring Provider (PT): Okey Regal   Encounter Date: 07/02/2019  PT End of Session - 07/02/19 0953    Visit Number  20    Number of Visits  33    Date for PT Re-Evaluation  08/05/19    PT Start Time  0745    PT Stop Time  0825    PT Time Calculation (min)  40 min    Equipment Utilized During Treatment  Gait belt;Other (comment)   Hemiwalker   Activity Tolerance  Patient limited by fatigue;Other (comment)    Behavior During Therapy  [facility] for tasks assessed/performed;Anxious       Past Medical History:  Diagnosis Date  . Anxiety   . Collagen vascular disease ([place])   . Depression   . Stroke [facility])     Past Surgical History:  Procedure Laterality Date  . CHOLECYSTECTOMY      There were no vitals filed for this visit.  Subjective Assessment - 07/02/19 0952    Subjective  Patient reports that her feet are much better today, no pain.    Patient is accompained by:  Family member    Pertinent History  Patient presents for instability s/p CVA in 01/2017. Patient was recently diagnosed with osteoporosis in bilateral legs and feet. Is potentially getting surgery in 2 weeks on both feet. After discharge from outpatient physical therapy had one session with home health PT but no other therapy. Reports no falls. Reports walking from living room to kitchen and back to walkway however her LLE gives out frequently . Per husband and patient she was going to an adult day center prior to [place]. Patient has a flat/level entry. Trying to get a power chair at this time from [employer]. Has a manual chair at this time, walker, hemi  walker, quad cane. Doing bird baths but has a walk in tub she can't get in, does have a shower chair. Patient and husband aware that patient will need caregiver with her due to toileting needs.    Limitations  Sitting;Lifting;Standing;Walking;House hold activities    How long can you sit comfortably?  recliner 5 hours, 30 minute sin wheelchair    How long can you stand comfortably?  4 minutes with UE support    How long can you walk comfortably?  15 ft    Patient Stated Goals  to walk better    Currently in Pain?  No/denies    Pain Score  0-No pain    Pain Onset  More than a month ago         Treatment  Transfer <>multi level surfaces <>nu-step withCGAassist x4transfers, Patients performance improves with practice and cues to lean fwd improves quality of transfer  Transfers sit to standx 5with hemiwalker andCGAassist and Vc for safety and sequencing, patient improves with practice  PROM to left heel cord and left knee hamstring x 30 sec x 3,mod VCs for positioning to isolate calf strengthening  LAQ BLE x 10 x 2 ,Patient required min-moderate verbal/tactile cues for correct exercise technique.  Hip flex x 10 x 2,Patient required min-moderate verbal/tactile cues for correct exercise technique.  Gait training with hemi walker  50feet x 3 , wc following, CGA and VC for step length and posture, cues to avoid hip flexion but isolate knee extension for better stretch in hamstring;  Standing and weight shifting left and right x3mins x2reps,Patient required min-moderate verbal/tactile cues for correct exercise technique.   Pt educated about posture and technique with exercises to improve exercise technique, movement at target joints, with min verbal, visual, tactile cues                       PT Education - 07/02/19 0952    Education Details  HEP    [name]  Patient    Methods  Explanation    Comprehension  Verbalized  understanding;Returned demonstration;Need further instruction       PT Short Term Goals - 05/21/19 1446      PT SHORT TERM GOAL #1   Title  Patient will perform one sit to stand independently from wheelchair to increase independence with transfers    Baseline  6/17: min/mod A    Time  2    Period  Weeks    Status  Partially Met    Target Date  04/29/19      PT SHORT TERM GOAL #2   Title  Patient will be independent in home exercise program to improve strength/mobility for better functional independence with ADLs.    Baseline  HEP to be given next session    Time  2    Status  Partially Met    Target Date  04/29/19        PT Long Term Goals - 07/02/19 0954      PT LONG TERM GOAL #1   Title  Patient will perform 5 STS < 40 seconds independently with hemi walker to demonstrate improved home mobility.    Baseline  6/17: unable to perform one time without Min/Mod A, 05/21/19 1.06 sec: 07/02/19=56 sec    Time  8    Period  Weeks    Status  Partially Met    Target Date  08/05/19      PT LONG TERM GOAL #2   Title  Patient will stand without UE support for 3-5 minutes for ADL performance without LOB to increase independence with dressing and toileting.    Baseline  6/17: unable to stand without UE support, 05/21/19 1 min 30 sec:07/02/19= 3 min    Time  8    Period  Weeks    Status  New    Target Date  08/05/19      PT LONG TERM GOAL #3   Title  Patient will ambulate 60 ft with hemi walker and CGA to improve home mobility and independence with gait.     Baseline  6/17: 10 ft, 05/21/19 17 feet: 07/02/19 = 50 feet    Time  8    Period  Weeks    Status  Partially Met    Target Date  08/05/19      PT LONG TERM GOAL #4   Title  Patient will increase BLE gross strength to 4+/5 as to improve functional strength for independent gait, increased standing tolerance and increased ADL ability    Baseline  6/17: hips grossly 2+/5 knees 3+/5:     07/02/19: R hip  flex -3/5, L hip flex 2/5 knees 3+/5     Time  8    Period  Weeks    Status  Partially Met    Target Date  08/05/19              Plan - 07/02/19 0953    Clinical Impression Statement  Patient's condition has the potential to improve in response to therapy. Maximum improvement is yet to be obtained. The anticipated improvement is attainable and reasonable in a generally predictable time.  Patient reports that she is able to stand longer in her bathroom at home, and she walked into the dentist office. She has improved in her standing tolerance and ability to weight shift during ambulation. She is progression in her goals and will continue to benefit from skilled PT to improve mobility and safety.       Patient will benefit from skilled therapeutic intervention in order to improve the following deficits and impairments:     Visit Diagnosis: Muscle weakness (generalized)  Other lack of coordination  Other abnormalities of gait and mobility  Collagen vascular disease (HCC)     Problem List Patient Active Problem List   Diagnosis Date Noted  . Collagen vascular disease (HCC) 05/21/2017  . 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) 05/21/2017    ,  S, PT DPT 07/02/2019, 10:01 AM  [facility] SERVICES [address] [place], [place], [address] Phone: [phone]   Fax:  [phone]  Name: Kadijah J Huyser MRN: [id number] Date of Birth: 05/29/1946   

## 2019-07-07 ENCOUNTER — Ambulatory Visit: Payer: Medicare Other | Admitting: Physical Therapy

## 2019-07-07 ENCOUNTER — Encounter: Payer: Medicare Other | Admitting: Occupational Therapy

## 2019-07-09 ENCOUNTER — Encounter: Payer: Self-pay | Admitting: Occupational Therapy

## 2019-07-09 ENCOUNTER — Ambulatory Visit: Payer: Medicare Other | Admitting: Physical Therapy

## 2019-07-09 ENCOUNTER — Encounter: Payer: Self-pay | Admitting: Physical Therapy

## 2019-07-09 ENCOUNTER — Other Ambulatory Visit: Payer: Self-pay

## 2019-07-09 ENCOUNTER — Ambulatory Visit: Payer: Medicare Other | Admitting: Occupational Therapy

## 2019-07-09 DIAGNOSIS — M6281 Muscle weakness (generalized): Secondary | ICD-10-CM

## 2019-07-09 DIAGNOSIS — R278 Other lack of coordination: Secondary | ICD-10-CM

## 2019-07-09 DIAGNOSIS — R2689 Other abnormalities of gait and mobility: Secondary | ICD-10-CM

## 2019-07-09 NOTE — Therapy (Signed)
[place] MAIN [facility] SERVICES [address] [place], [place], [address] Phone: [phone]   Fax:  [phone]  Occupational Therapy Treatment  Patient Details  Name: Ariel Alvarez MRN: [id number] Date of Birth: May 04, 1946 No data recorded  Encounter Date: 07/09/2019  OT End of Session - 07/09/19 1536    Visit Number  9    Number of Visits  24    Date for OT Re-Evaluation  08/18/19    OT Start Time  0845    OT Stop Time  0930    OT Time Calculation (min)  45 min    Equipment Utilized During Treatment  adaptive writing aids, dycem    Activity Tolerance  Patient tolerated treatment well    Behavior During Therapy  [facility] for tasks assessed/performed;Anxious       Past Medical History:  Diagnosis Date  . Anxiety   . Collagen vascular disease ([place])   . Depression   . Stroke [facility])     Past Surgical History:  Procedure Laterality Date  . CHOLECYSTECTOMY      There were no vitals filed for this visit.  Subjective Assessment - 07/09/19 1534    Subjective   Pt. has an appointment with the podiatrist tomorrow.    Patient is accompanied by:  Family member    Pertinent History  Pt. is a 73 y.o. female who sustained multiple CVAs with left hemiplegia, Pt. PMHx includes: Depression, Collagen Vascular Disease, Anxiety. Pt. recieved therapy services in thre past. Pt. resides at home with her husband who is supportive, and assists with her care. Pt. is alone during the day, as her husband works.    Currently in Pain?  Yes    Pain Score  --   Not rated   Pain Location  --   [place]      OT TREATMENT    Therapeutic Exercise:  Pt. tolerated PROM, with slow gentle stretching of the LUE for shoulder flexion, abduction, elbow flexion, extension, forearm supination, wrist extension, flexion, digit MP, PIP, and DIP flexion, extension, thumb abduction, and thumb PIP extension. Pt. had limited tolerance for shoulder flexion, and abduction today.  Pt. education was provided about LUE positioning.                      OT Education - 07/09/19 1536    Education Details  HEP, strength, fine motor, ROM    Methods  Explanation;Demonstration;Verbal cues    Comprehension  Verbal cues required;Returned demonstration;Verbalized understanding          OT Long Term Goals - 05/26/19 1723      OT LONG TERM GOAL #1   Title  Pt. will improve RUE strength to be be able to reach into cabinetry to put items away without compensating posteriorly with her trunk.    Baseline  05/26/2019: Pt. leans posteriorly when reaching with her RUE    Time  12    Period  Weeks    Status  New    Target Date  08/18/19      OT LONG TERM GOAL #2   Title  Pt. will independently complete light meal preparation in standing    Baseline  05/26/2019: Pt. has difficulty    Time  12    Period  Weeks    Status  New    Target Date  08/18/19      OT LONG TERM GOAL #3   Title  Pt. will improve  right hand [facility] skills to be able to independently apply hook earrings.    Baseline  05/26/2019: R: 1 min &1 sec. Pt. is unable to place earrings.    Time  12    Period  Weeks    Status  New    Target Date  08/18/19      OT LONG TERM GOAL #4   Title  Pt. will improve right grip strength to be able to hold a can at the can opener    Baseline  05/26/2019: R: 15#. Pt. is unable to hold, and use a can opener    Time  12    Period  Weeks    Status  New    Target Date  08/18/19      OT LONG TERM GOAL #5   Title  Pt. will require supervision to sweep the floor.    Baseline  05/26/2019: Pt. is unable    Time  12    Period  Weeks    Status  New    Target Date  08/18/19            Plan - 07/09/19 1536    Clinical Impression Statement  Pt. reports she is planning a trip to [facility] next week with her husband. Pt. was measured for a soft resting hand splint. Pt. edcuation was provided about the recommended splint type. Pt. reports that she would like  to see if she can dig her splint out of the attic before purchasing a new one. Pt. plans to bring the splint in  from home when she finds it. Pt. continues to present with increased flexor tone, tightness, and spasticity through the LUE, and hand. Pt. conitnues to work on improving ROM, reviewing self-ROM, and increasing engagement of the left hand during ADLs, and IADLs.    OT Occupational Profile and History  Comprehensive Assessment- Review of records and extensive additional review of physical, cognitive, psychosocial history related to current functional performance    Occupational performance deficits (Please refer to evaluation for details):  ADL's;IADL's    Body Structure / Function / Physical Skills  ADL;IADL;FMC;ROM;Pain;UE functional use;Strength;Dexterity    Rehab Potential  Good    Clinical Decision Making  Several treatment options, min-mod task modification necessary    Comorbidities Affecting Occupational Performance:  Presence of comorbidities impacting occupational performance    Comorbidities impacting occupational performance description:  histroy of strokes, obesity, decreased balance    Modification or Assistance to Complete Evaluation   Min-Moderate modification of tasks or assist with assess necessary to complete eval    OT Frequency  2x / week    OT Duration  12 weeks    OT Treatment/Interventions  Self-care/ADL training;Neuromuscular education;Manual Therapy;Therapeutic activities;Therapeutic exercise;Patient/family education    OT Home Exercise Plan  Self ROM exercises tabletop    Consulted and Agree with Plan of Care  Patient       Patient will benefit from skilled therapeutic intervention in order to improve the following deficits and impairments:   Body Structure / Function / Physical Skills: ADL, IADL, FMC, ROM, Pain, UE functional use, Strength, Dexterity       Visit Diagnosis: Muscle weakness (generalized)    Problem List Patient Active Problem List    Diagnosis Date Noted  . Collagen vascular disease (HCC) 05/21/2017  . 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

## 2019-07-09 NOTE — Therapy (Signed)
[place] MAIN [facility] SERVICES [address] [place], [place], [address] Phone: [phone]   Fax:  [phone]  Physical Therapy Treatment  Patient Details  Name: Ariel Alvarez MRN: [id number] Date of Birth: [birth year] Referring Provider (PT): Okey Regal   Encounter Date: 07/09/2019    Past Medical History:  Diagnosis Date  . Anxiety   . Collagen vascular disease ([place])   . Depression   . Stroke [facility])     Past Surgical History:  Procedure Laterality Date  . CHOLECYSTECTOMY      There were no vitals filed for this visit.  Subjective Assessment - 07/09/19 0828    Subjective  Patient reports that her feet have develped some ulcers on her big toe bilaterally.    Patient is accompained by:  Family member    Pertinent History  Patient presents for instability s/p CVA in 01/2017. Patient was recently diagnosed with osteoporosis in bilateral legs and feet. Is potentially getting surgery in 2 weeks on both feet. After discharge from outpatient physical therapy had one session with home health PT but no other therapy. Reports no falls. Reports walking from living room to kitchen and back to walkway however her LLE gives out frequently . Per husband and patient she was going to an adult day center prior to [place]. Patient has a flat/level entry. Trying to get a power chair at this time from [employer]. Has a manual chair at this time, walker, hemi walker, quad cane. Doing bird baths but has a walk in tub she can't get in, does have a shower chair. Patient and husband aware that patient will need caregiver with her due to toileting needs.    Limitations  Sitting;Lifting;Standing;Walking;House hold activities    How long can you sit comfortably?  recliner 5 hours, 30 minute sin wheelchair    How long can you stand comfortably?  4 minutes with UE support    How long can you walk comfortably?  15 ft    Patient Stated Goals  to walk  better    Pain Score  5     Pain Location  Foot    Pain Orientation  Right;Left    Pain Descriptors / Indicators  Aching    Pain Onset  More than a month ago        Treatment  Transfer <>multi level surfaces <>nu-step withCGAassist x4transfers, Patients performance improves with practice and cues to lean fwd improves quality of transfer  Transfers sit to standx 5with hemiwalker andCGAassist and Vc for safety and sequencing, patient improves with practice  PROM to left heel cord and left knee hamstring x 30 sec x 3,mod VCs for positioning to isolate calf strengthening  LAQ BLE x 10 x 2,Patient required min-moderate verbal/tactile cues for correct exercise technique.  Hip flex x 10 x 2,Patient required min-moderate verbal/tactile cues for correct exercise technique.  Gait training with hemi walker 69fet x 1 , wc following, CGA and VC for step length and posture,cues to avoid hip flexion but isolate knee extension for better stretch in hamstring;  Standing and weight shifting left and right x360ms x2reps,Patient required min-moderate verbal/tactile cues for correct exercise technique.   Pt educated about posture and technique with exercises to improve exercise technique, movement at target joints, with min verbal, visual, tactile cues                         PT Education - 07/09/19  0829    Education Details  HEP    [name]  Patient    Methods  Explanation    Comprehension  Verbalized understanding       PT Short Term Goals - 05/21/19 1446      PT SHORT TERM GOAL #1   Title  Patient will perform one sit to stand independently from wheelchair to increase independence with transfers    Baseline  6/17: min/mod A    Time  2    Period  Weeks    Status  Partially Met    Target Date  04/29/19      PT SHORT TERM GOAL #2   Title  Patient will be independent in home exercise program to improve strength/mobility  for better functional independence with ADLs.    Baseline  HEP to be given next session    Time  2    Status  Partially Met    Target Date  04/29/19        PT Long Term Goals - 07/02/19 0954      PT LONG TERM GOAL #1   Title  Patient will perform 5 STS < 40 seconds independently with hemi walker to demonstrate improved home mobility.    Baseline  6/17: unable to perform one time without Min/Mod A, 05/21/19 1.06 sec: 07/02/19=56 sec    Time  8    Period  Weeks    Status  Partially Met    Target Date  08/05/19      PT LONG TERM GOAL #2   Title  Patient will stand without UE support for 3-5 minutes for ADL performance without LOB to increase independence with dressing and toileting.    Baseline  6/17: unable to stand without UE support, 05/21/19 1 min 30 sec:07/02/19= 3 min    Time  8    Period  Weeks    Status  New    Target Date  08/05/19      PT LONG TERM GOAL #3   Title  Patient will ambulate 60 ft with hemi walker and CGA to improve home mobility and independence with gait.     Baseline  6/17: 10 ft, 05/21/19 17 feet: 07/02/19 = 50 feet    Time  8    Period  Weeks    Status  Partially Met    Target Date  08/05/19      PT LONG TERM GOAL #4   Title  Patient will increase BLE gross strength to 4+/5 as to improve functional strength for independent gait, increased standing tolerance and increased ADL ability    Baseline  6/17: hips grossly 2+/5 knees 3+/5:     07/02/19: R hip  flex -3/5, L hip flex 2/5 knees 3+/5    Time  8    Period  Weeks    Status  Partially Met    Target Date  08/05/19            Plan - 07/09/19 0829    Clinical Impression Statement  Patient is having pain to B MTP of great toe with red areas. She was unable to walk her normal distance due to this new pain. Patient was able to perform therapeutic exercises in sitting wihtout pain. Patient will conitnue to benefit from skilled PT to improve mobility and saftey.    Personal Factors and Comorbidities  Behavior  Pattern;Comorbidity 3+;Fitness;Past/Current Experience;Social Background;Time since onset of injury/illness/exacerbation;Transportation    Comorbidities  anxiety, collagen vascular disease, depression,  stroke, amputated digit    Examination-Activity Limitations  Bathing;Bed Mobility;Bend;Caring for Others;Carry;Continence;Dressing;Sleep;Sit;Reach Overhead;Locomotion Level;Lift;Hygiene/Grooming;Squat;Stairs;Stand;Toileting;Transfers    Examination-Participation Restrictions  Church;Cleaning;Community Activity;Driving;Interpersonal Relationship;Meal Prep;Medication Management;Laundry;Shop;Volunteer;Yard Work    [employer]  Evolving/Moderate complexity    Rehab Potential  Fair    PT Frequency  2x / week    PT Treatment/Interventions  ADLs/Self Care Home Management;Aquatic Therapy;Cryotherapy;Biofeedback;Electrical Stimulation;Iontophoresis 80m/ml Dexamethasone;Moist Heat;Traction;Ultrasound;Functional mobility training;Stair training;Gait training;DME Instruction;Therapeutic activities;Therapeutic exercise;Balance training;Neuromuscular re-education;Orthotic Fit/Training;Patient/family education;Cognitive remediation;Wheelchair mobility training;Manual techniques;Taping;Splinting;Energy conservation    PT Next Visit Plan  give HEP, practice SPT, ambulation, COM    PT Home Exercise Plan  Reviewed LAQs and alt hip flexion with pt today; pt able to verbalize and demonstrate understanding       Patient will benefit from skilled therapeutic intervention in order to improve the following deficits and impairments:  Abnormal gait, Decreased activity tolerance, Decreased coordination, Decreased balance, Decreased endurance, Decreased knowledge of precautions, Decreased knowledge of use of DME, Decreased safety awareness, Decreased range of motion, Decreased mobility, Decreased strength, Difficulty walking, Hypomobility, Increased fascial restricitons, Impaired flexibility, Impaired perceived  functional ability, Impaired sensation, Impaired tone, Impaired UE functional use, Postural dysfunction, Improper body mechanics, Pain, Obesity  Visit Diagnosis: Muscle weakness (generalized)  Other lack of coordination  Other abnormalities of gait and mobility     Problem List Patient Active Problem List   Diagnosis Date Noted  . [place] vascular disease ([place] 05/21/2017  . Gangrene of finger of right hand ([place] 05/21/2017    MAlanson Puls PT DPT 07/09/2019, 8:33 AM  [place] [facility] [address] [address]Phone: [phone]  Fax:  [phone] Name: SSANGITA ZANIMRN: [id number] of Birth: 1Feb 12, 1947

## 2019-07-14 ENCOUNTER — Encounter: Payer: Self-pay | Admitting: Occupational Therapy

## 2019-07-14 ENCOUNTER — Other Ambulatory Visit: Payer: Self-pay

## 2019-07-14 ENCOUNTER — Ambulatory Visit: Payer: Medicare Other | Admitting: Physical Therapy

## 2019-07-14 ENCOUNTER — Ambulatory Visit: Payer: Medicare Other | Admitting: Occupational Therapy

## 2019-07-14 DIAGNOSIS — R2689 Other abnormalities of gait and mobility: Secondary | ICD-10-CM

## 2019-07-14 DIAGNOSIS — M6281 Muscle weakness (generalized): Secondary | ICD-10-CM

## 2019-07-14 DIAGNOSIS — R278 Other lack of coordination: Secondary | ICD-10-CM

## 2019-07-14 NOTE — Therapy (Signed)
[place] MAIN [facility] SERVICES [address] [place], [place], [address] Phone: [phone]   Fax:  [phone]  Occupational Therapy Progress Note  Dates of reporting period  05/26/2019   to  07/14/2019  Patient Details  Name: Ariel Alvarez MRN: [id number] Date of Birth: 1946/01/11 No data recorded  Encounter Date: 07/14/2019  OT End of Session - 07/14/19 0942    Visit Number  10    Number of Visits  24    Date for OT Re-Evaluation  08/18/19    OT Start Time  0845    OT Stop Time  0930    OT Time Calculation (min)  45 min    Equipment Utilized During Treatment  LUE ROM, self-ROM, RUE grip strength, and coordination    Activity Tolerance  Patient tolerated treatment well    Behavior During Therapy  [facility] for tasks assessed/performed;Anxious       Past Medical History:  Diagnosis Date  . Anxiety   . Collagen vascular disease ([place])   . Depression   . Stroke [facility])     Past Surgical History:  Procedure Laterality Date  . CHOLECYSTECTOMY      There were no vitals filed for this visit.  Subjective Assessment - 07/14/19 0941    Subjective   Pt. hopes that she will be able to go to [facility].    Patient is accompanied by:  Family member    Pertinent History  Pt. is a 73 y.o. female who sustained multiple CVAs with left hemiplegia, Pt. PMHx includes: Depression, Collagen Vascular Disease, Anxiety. Pt. recieved therapy services in thre past. Pt. resides at home with her husband who is supportive, and assists with her care. Pt. is alone during the day, as her husband works.    Currently in Pain?  No/denies      OT TREATMENT  Therapeutic Exercise:  Pt. tolerated PROM, with slow gentle stretching of the LUE for shoulder flexion, abduction, elbow flexion, extension, forearm supination, wrist extension, flexion, digit MP, PIP, and DIP flexion, extension, thumb abduction, and thumb PIP extension. Pt. had limited tolerance for  shoulder flexion, and abduction today. Pt. education was provided about LUE positioning. Pt. performed right hand gross gripping with a handheld grip strengthener. Pt. worked on sustaining grip while grasping pegs and reaching at various heights. The gripper was placed at 11.2# of grip strength force.   Neuro muscular re-education:  Pt. performed [facility] tasks using the Grooved pegboard. Pt. worked on grasping the grooved pegs from a horizontal position, and moving the pegs to a vertical position in the hand to prepare for placing them in the grooved slot. Pt. had difficulty with twisting the pegs in preparation for placing them into the grooved pegboard.                         OT Education - 07/14/19 [phone]    Education Details  HEP, strength, fine motor, ROM    [name]  Patient    Methods  Explanation;Demonstration;Verbal cues    Comprehension  Verbal cues required;Returned demonstration;Verbalized understanding          OT Long Term Goals - 07/14/19 0952      OT LONG TERM GOAL #1   Title  Pt. will improve RUE strength to be be able to reach into cabinetry to put items away without compensating posteriorly with her trunk.    Baseline  Pt. continues to lean  posteriorly when reaching with her RUE    Time  12    Period  Weeks    Status  On-going    Target Date  08/18/19      OT LONG TERM GOAL #2   Title  Pt. will independently complete light meal preparation in standing    Baseline  Pt. continues to have difficulty    Time  12    Period  Weeks    Status  On-going    Target Date  08/18/19      OT LONG TERM GOAL #3   Title  Pt. will improve right hand [facility] skills to be able to independently apply hook earrings.    Baseline  Pt. continues to be unable to place earrings.    Time  12    Period  Weeks    Status  On-going    Target Date  08/18/19      OT LONG TERM GOAL #4   Title  Pt. will improve right grip strength to be able to hold a can at the can  opener    Baseline  Pt. continues to be unable to hold, and use a can opener    Time  12    Period  Weeks    Status  New    Target Date  08/18/19      OT LONG TERM GOAL #5   Title  Pt. will require supervision to sweep the floor.    Baseline  Pt. is unable    Time  12    Period  Weeks    Status  On-going    Target Date  08/18/19            Plan - 07/14/19 0944    Clinical Impression Statement  Pt. reports that her trip to [facility] with her husband will be weather dependent. Pt. reports there are times at home that it feels like her LUE is brushing through her hair. Pt. continues to present with increased flexor tone, tightness, and spasticity. Pt. continues to work on improving tone in the LUE, LUE ROM, RUE strength, and FMC skills in order to improve overall, ADL, and IADL functioning. Goals were reviewed.   OT Occupational Profile and History  Comprehensive Assessment- Review of records and extensive additional review of physical, cognitive, psychosocial history related to current functional performance    Occupational performance deficits (Please refer to evaluation for details):  ADL's;IADL's    Body Structure / Function / Physical Skills  ADL;IADL;FMC;ROM;Pain;UE functional use;Strength;Dexterity    Rehab Potential  Good    Clinical Decision Making  Several treatment options, min-mod task modification necessary    Comorbidities Affecting Occupational Performance:  Presence of comorbidities impacting occupational performance    Comorbidities impacting occupational performance description:  histroy of strokes, obesity, decreased balance    Modification or Assistance to Complete Evaluation   Min-Moderate modification of tasks or assist with assess necessary to complete eval    OT Frequency  2x / week    OT Duration  12 weeks    OT Treatment/Interventions  Self-care/ADL training;Neuromuscular education;Manual Therapy;Therapeutic activities;Therapeutic exercise;Patient/family  education    OT Home Exercise Plan  Self ROM exercises tabletop    Consulted and Agree with Plan of Care  Patient       Patient will benefit from skilled therapeutic intervention in order to improve the following deficits and impairments:   Body Structure / Function / Physical Skills: ADL, IADL, FMC, ROM, Pain, UE  functional use, Strength, Dexterity       Visit Diagnosis: Muscle weakness (generalized)  Other lack of coordination    Problem List Patient Active Problem List   Diagnosis Date Noted  . Collagen vascular disease ([facility]) 05/21/2017  . 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) 05/21/2017    Olegario Messier, MS, OTR/L 07/14/2019, 10:03 AM  [facility] MAIN [facility] SERVICES [address] [place], [place], [address] Phone: [phone]   Fax:  [phone]  Name: DONINIQUE LWIN MRN: [id number] Date of Birth: 12-Jan-1946

## 2019-07-14 NOTE — Therapy (Signed)
[place] MAIN [facility] SERVICES [address] [place], [place], [address] Phone: [phone]   Fax:  [phone]  Physical Therapy Treatment  Patient Details  Name: Ariel Alvarez MRN: [id number] Date of Birth: [birth year] Referring Provider (PT): Okey Regal   Encounter Date: 07/14/2019  PT End of Session - 07/14/19 0830    Visit Number  21    Number of Visits  33    Date for PT Re-Evaluation  08/05/19    PT Start Time  0800    PT Stop Time  0845    PT Time Calculation (min)  45 min    Equipment Utilized During Treatment  Gait belt;Other (comment)   Hemiwalker   Activity Tolerance  Patient limited by fatigue;Other (comment)    Behavior During Therapy  [facility] for tasks assessed/performed;Anxious       Past Medical History:  Diagnosis Date  . Anxiety   . Collagen vascular disease ([place])   . Depression   . Stroke [facility])     Past Surgical History:  Procedure Laterality Date  . CHOLECYSTECTOMY      There were no vitals filed for this visit.  Subjective Assessment - 07/14/19 0827    Subjective  Patient reports that her feet have develped some ulcers on her big toe bilaterally.    Patient is accompained by:  Family member    Pertinent History  Patient presents for instability s/p CVA in 01/2017. Patient was recently diagnosed with osteoporosis in bilateral legs and feet. Is potentially getting surgery in 2 weeks on both feet. After discharge from outpatient physical therapy had one session with home health PT but no other therapy. Reports no falls. Reports walking from living room to kitchen and back to walkway however her LLE gives out frequently . Per husband and patient she was going to an adult day center prior to [place]. Patient has a flat/level entry. Trying to get a power chair at this time from [employer]. Has a manual chair at this time, walker, hemi walker, quad cane. Doing bird baths but has a walk in tub she can't  get in, does have a shower chair. Patient and husband aware that patient will need caregiver with her due to toileting needs.    Limitations  Sitting;Lifting;Standing;Walking;House hold activities    How long can you sit comfortably?  recliner 5 hours, 30 minute sin wheelchair    How long can you stand comfortably?  4 minutes with UE support    How long can you walk comfortably?  15 ft    Patient Stated Goals  to walk better    Pain Onset  More than a month ago           Treatment  Transfer <>multi level surfaces <>nu-step withCGAassist x4transfers, Patients performance improves with practice and cues to lean fwd improves quality of transfer  Transfers sit to standx 5with hemiwalker andCGAassist and Vc for safety and sequencing, patient improves with practice  PROM to left heel cord and left knee hamstring x 30 sec x 3,mod VCs for positioning to isolate calf strengthening  LAQ BLE x 10 x 2,Patient required min-moderate verbal/tactile cues for correct exercise technique.  Hip flex x 10 x 2,Patient required min-moderate verbal/tactile cues for correct exercise technique.  Gait training with hemi walker 80fetx 4, wc following, CGA and VC for step length and posture,cues to avoid hip flexion but isolate knee extension for better stretch in hamstring;  Pt educated about posture and technique with exercises to improve exercise technique, movement at target joints, with min verbal, visual, tactile cues                         PT Education - 07/14/19 0829    Education Details  HEP    [name]  Patient    Methods  Explanation    Comprehension  Verbalized understanding;Returned demonstration;Verbal cues required;Need further instruction       PT Short Term Goals - 05/21/19 1446      PT SHORT TERM GOAL #1   Title  Patient will perform one sit to stand independently from wheelchair to increase independence with  transfers    Baseline  6/17: min/mod A    Time  2    Period  Weeks    Status  Partially Met    Target Date  04/29/19      PT SHORT TERM GOAL #2   Title  Patient will be independent in home exercise program to improve strength/mobility for better functional independence with ADLs.    Baseline  HEP to be given next session    Time  2    Status  Partially Met    Target Date  04/29/19        PT Long Term Goals - 07/02/19 0954      PT LONG TERM GOAL #1   Title  Patient will perform 5 STS < 40 seconds independently with hemi walker to demonstrate improved home mobility.    Baseline  6/17: unable to perform one time without Min/Mod A, 05/21/19 1.06 sec: 07/02/19=56 sec    Time  8    Period  Weeks    Status  Partially Met    Target Date  08/05/19      PT LONG TERM GOAL #2   Title  Patient will stand without UE support for 3-5 minutes for ADL performance without LOB to increase independence with dressing and toileting.    Baseline  6/17: unable to stand without UE support, 05/21/19 1 min 30 sec:07/02/19= 3 min    Time  8    Period  Weeks    Status  New    Target Date  08/05/19      PT LONG TERM GOAL #3   Title  Patient will ambulate 60 ft with hemi walker and CGA to improve home mobility and independence with gait.     Baseline  6/17: 10 ft, 05/21/19 17 feet: 07/02/19 = 50 feet    Time  8    Period  Weeks    Status  Partially Met    Target Date  08/05/19      PT LONG TERM GOAL #4   Title  Patient will increase BLE gross strength to 4+/5 as to improve functional strength for independent gait, increased standing tolerance and increased ADL ability    Baseline  6/17: hips grossly 2+/5 knees 3+/5:     07/02/19: R hip  flex -3/5, L hip flex 2/5 knees 3+/5    Time  8    Period  Weeks    Status  Partially Met    Target Date  08/05/19            Plan - 07/14/19 0830    Clinical Impression Statement  Patient has unsteady transfer when needing to transfer from surface to surface  involving a turn. She needs instruction and cues to get  herself all the way turned and in the correct position to sit in the chair. She tends to sit diagonally and not completely on the chair surface. She performs transfers for saftey. She performs gait training during treatment and will continue to benefit from skilled PT.    Personal Factors and Comorbidities  Behavior Pattern;Comorbidity 3+;Fitness;Past/Current Experience;Social Background;Time since onset of injury/illness/exacerbation;Transportation    Comorbidities  anxiety, collagen vascular disease, depression, stroke, amputated digit    Examination-Activity Limitations  Bathing;Bed Mobility;Bend;Caring for Others;Carry;Continence;Dressing;Sleep;Sit;Reach Overhead;Locomotion Level;Lift;Hygiene/Grooming;Squat;Stairs;Stand;Toileting;Transfers    Examination-Participation Restrictions  Church;Cleaning;Community Activity;Driving;Interpersonal Relationship;Meal Prep;Medication Management;Laundry;Shop;Volunteer;Yard Work    [employer]  Evolving/Moderate complexity    Rehab Potential  Fair    PT Frequency  2x / week    PT Treatment/Interventions  ADLs/Self Care Home Management;Aquatic Therapy;Cryotherapy;Biofeedback;Electrical Stimulation;Iontophoresis 25m/ml Dexamethasone;Moist Heat;Traction;Ultrasound;Functional mobility training;Stair training;Gait training;DME Instruction;Therapeutic activities;Therapeutic exercise;Balance training;Neuromuscular re-education;Orthotic Fit/Training;Patient/family education;Cognitive remediation;Wheelchair mobility training;Manual techniques;Taping;Splinting;Energy conservation    PT Next Visit Plan  give HEP, practice SPT, ambulation, COM    PT Home Exercise Plan  Reviewed LAQs and alt hip flexion with pt today; pt able to verbalize and demonstrate understanding       Patient will benefit from skilled therapeutic intervention in order to improve the following deficits and impairments:   Abnormal gait, Decreased activity tolerance, Decreased coordination, Decreased balance, Decreased endurance, Decreased knowledge of precautions, Decreased knowledge of use of DME, Decreased safety awareness, Decreased range of motion, Decreased mobility, Decreased strength, Difficulty walking, Hypomobility, Increased fascial restricitons, Impaired flexibility, Impaired perceived functional ability, Impaired sensation, Impaired tone, Impaired UE functional use, Postural dysfunction, Improper body mechanics, Pain, Obesity  Visit Diagnosis: Muscle weakness (generalized)  Other lack of coordination  Other abnormalities of gait and mobility     Problem List Patient Active Problem List   Diagnosis Date Noted  . [place] disease ([place] 05/21/2017  . Gangrene of finger of right hand ([facility] 05/21/2017    MAlanson Puls PVirginiaDPT 07/14/2019, 8:39 AM  [place] [facility] [address] [place] [address]Phone: [phone]  Fax:  [phone] Name: Ariel BROSSARDMRN: [id number] of Birth: [birth year]

## 2019-07-16 ENCOUNTER — Ambulatory Visit: Payer: Medicare Other | Admitting: Occupational Therapy

## 2019-07-16 ENCOUNTER — Ambulatory Visit: Payer: Medicare Other | Admitting: Physical Therapy

## 2019-07-16 ENCOUNTER — Encounter: Payer: Self-pay | Admitting: Physical Therapy

## 2019-07-16 ENCOUNTER — Other Ambulatory Visit: Payer: Self-pay

## 2019-07-16 ENCOUNTER — Encounter: Payer: Self-pay | Admitting: Occupational Therapy

## 2019-07-16 DIAGNOSIS — M6281 Muscle weakness (generalized): Secondary | ICD-10-CM

## 2019-07-16 DIAGNOSIS — R278 Other lack of coordination: Secondary | ICD-10-CM

## 2019-07-16 DIAGNOSIS — R2689 Other abnormalities of gait and mobility: Secondary | ICD-10-CM

## 2019-07-16 NOTE — Therapy (Signed)
[place] MAIN [facility] SERVICES [address] [place], [place], [address] Phone: [phone]   Fax:  [phone]  Physical Therapy Treatment  Patient Details  Name: Ariel Alvarez MRN: [id number] Date of Birth: [birth year] Referring Provider (PT): Okey Regal   Encounter Date: 07/16/2019  PT End of Session - 07/16/19 0809    Visit Number  22    Number of Visits  33    Date for PT Re-Evaluation  08/05/19    PT Start Time  0800    PT Stop Time  0845    PT Time Calculation (min)  45 min    Equipment Utilized During Treatment  Gait belt;Other (comment)   Hemiwalker   Activity Tolerance  Patient limited by fatigue;Other (comment)    Behavior During Therapy  [facility] for tasks assessed/performed;Anxious       Past Medical History:  Diagnosis Date  . Anxiety   . Collagen vascular disease ([place])   . Depression   . Stroke [facility])     Past Surgical History:  Procedure Laterality Date  . CHOLECYSTECTOMY      There were no vitals filed for this visit.  Subjective Assessment - 07/16/19 0808    Subjective  Patient reports that her feet have develped some ulcers on her big toe bilaterally.She was able to get a MD appointment.    Patient is accompained by:  Family member    Pertinent History  Patient presents for instability s/p CVA in 01/2017. Patient was recently diagnosed with osteoporosis in bilateral legs and feet. Is potentially getting surgery in 2 weeks on both feet. After discharge from outpatient physical therapy had one session with home health PT but no other therapy. Reports no falls. Reports walking from living room to kitchen and back to walkway however her LLE gives out frequently . Per husband and patient she was going to an adult day center prior to [place]. Patient has a flat/level entry. Trying to get a power chair at this time from [employer]. Has a manual chair at this time, walker, hemi walker, quad cane. Doing bird  baths but has a walk in tub she can't get in, does have a shower chair. Patient and husband aware that patient will need caregiver with her due to toileting needs.    Limitations  Sitting;Lifting;Standing;Walking;House hold activities    How long can you sit comfortably?  recliner 5 hours, 30 minute sin wheelchair    How long can you stand comfortably?  4 minutes with UE support    How long can you walk comfortably?  15 ft    Patient Stated Goals  to walk better    Currently in Pain?  Yes    Pain Score  3     Pain Location  --   toes   Pain Onset  More than a month ago       Treatment  Transfer <>multi level surfaces <>nu-step withCGAassist x4transfers, Patients performance improves with practice and cues to lean fwd improves quality of transfer Patient needs cues to completely turn all the around and back up to her chair with max cues and sequencing  Transfers sit to standx 5with hemiwalker andCGAassist and Vc for safety and sequencing, patient improves with practice  PROM to left heel cord and left knee hamstring x 30 sec x 3,mod VCs for positioning to isolate calf strengthening  LAQ BLE with 2 lbs  x 10 x 2,Patient required min-moderate verbal/tactile cues for correct  exercise technique.  Hip flex with 2 lbs  x 10 x 2,Patient required min-moderate verbal/tactile cues for correct exercise technique.  Gait training with hemi walker 42fetx 4, wc following, CGA and VC for step length and posture,Patient is having B foot pain that is interfering with walking today.   Pt educated about posture and technique with exercises to improve exercise technique, movement at target joints, with min verbal, visual, tactile cues                        PT Education - 07/16/19 0809    Education Details  HEP    [name]  Patient    Methods  Explanation    Comprehension  Verbalized understanding;Returned demonstration;Verbal cues  required;Need further instruction       PT Short Term Goals - 05/21/19 1446      PT SHORT TERM GOAL #1   Title  Patient will perform one sit to stand independently from wheelchair to increase independence with transfers    Baseline  6/17: min/mod A    Time  2    Period  Weeks    Status  Partially Met    Target Date  04/29/19      PT SHORT TERM GOAL #2   Title  Patient will be independent in home exercise program to improve strength/mobility for better functional independence with ADLs.    Baseline  HEP to be given next session    Time  2    Status  Partially Met    Target Date  04/29/19        PT Long Term Goals - 07/02/19 0954      PT LONG TERM GOAL #1   Title  Patient will perform 5 STS < 40 seconds independently with hemi walker to demonstrate improved home mobility.    Baseline  6/17: unable to perform one time without Min/Mod A, 05/21/19 1.06 sec: 07/02/19=56 sec    Time  8    Period  Weeks    Status  Partially Met    Target Date  08/05/19      PT LONG TERM GOAL #2   Title  Patient will stand without UE support for 3-5 minutes for ADL performance without LOB to increase independence with dressing and toileting.    Baseline  6/17: unable to stand without UE support, 05/21/19 1 min 30 sec:07/02/19= 3 min    Time  8    Period  Weeks    Status  New    Target Date  08/05/19      PT LONG TERM GOAL #3   Title  Patient will ambulate 60 ft with hemi walker and CGA to improve home mobility and independence with gait.     Baseline  6/17: 10 ft, 05/21/19 17 feet: 07/02/19 = 50 feet    Time  8    Period  Weeks    Status  Partially Met    Target Date  08/05/19      PT LONG TERM GOAL #4   Title  Patient will increase BLE gross strength to 4+/5 as to improve functional strength for independent gait, increased standing tolerance and increased ADL ability    Baseline  6/17: hips grossly 2+/5 knees 3+/5:     07/02/19: R hip  flex -3/5, L hip flex 2/5 knees 3+/5    Time  8    Period   Weeks    Status  Partially Met  Target Date  08/05/19            Plan - 07/16/19 0809    Clinical Impression Statement  Patient has increased tone in her LLE foot and knee and pain in her foot from an ulcer that is causing difficulty with mobility in addition to her CVA. She was instructed in ambulation with hemiwalker for short distances, transfer training and safety training and therapeutic exercises to BLE. She will conntinue to benefit from skilled PT to improve mobility and safety.    Personal Factors and Comorbidities  Behavior Pattern;Comorbidity 3+;Fitness;Past/Current Experience;Social Background;Time since onset of injury/illness/exacerbation;Transportation    Comorbidities  anxiety, collagen vascular disease, depression, stroke, amputated digit    Examination-Activity Limitations  Bathing;Bed Mobility;Bend;Caring for Others;Carry;Continence;Dressing;Sleep;Sit;Reach Overhead;Locomotion Level;Lift;Hygiene/Grooming;Squat;Stairs;Stand;Toileting;Transfers    Examination-Participation Restrictions  Church;Cleaning;Community Activity;Driving;Interpersonal Relationship;Meal Prep;Medication Management;Laundry;Shop;Volunteer;Yard Work    [employer]  Evolving/Moderate complexity    Rehab Potential  Fair    PT Frequency  2x / week    PT Treatment/Interventions  ADLs/Self Care Home Management;Aquatic Therapy;Cryotherapy;Biofeedback;Electrical Stimulation;Iontophoresis 6m/ml Dexamethasone;Moist Heat;Traction;Ultrasound;Functional mobility training;Stair training;Gait training;DME Instruction;Therapeutic activities;Therapeutic exercise;Balance training;Neuromuscular re-education;Orthotic Fit/Training;Patient/family education;Cognitive remediation;Wheelchair mobility training;Manual techniques;Taping;Splinting;Energy conservation    PT Next Visit Plan  give HEP, practice SPT, ambulation, COM    PT Home Exercise Plan  Reviewed LAQs and alt hip flexion with pt today; pt able  to verbalize and demonstrate understanding       Patient will benefit from skilled therapeutic intervention in order to improve the following deficits and impairments:  Abnormal gait, Decreased activity tolerance, Decreased coordination, Decreased balance, Decreased endurance, Decreased knowledge of precautions, Decreased knowledge of use of DME, Decreased safety awareness, Decreased range of motion, Decreased mobility, Decreased strength, Difficulty walking, Hypomobility, Increased fascial restricitons, Impaired flexibility, Impaired perceived functional ability, Impaired sensation, Impaired tone, Impaired UE functional use, Postural dysfunction, Improper body mechanics, Pain, Obesity  Visit Diagnosis: Muscle weakness (generalized)  Other lack of coordination  Other abnormalities of gait and mobility     Problem List Patient Active Problem List   Diagnosis Date Noted  . [place] 05/21[place]  . Gangrene of finger of right hand ([facility] 05/21/2017    MAlanson Puls,PVirginiaDPT 07/16/2019, 8:39 AM  [place] [facility]Wis[facility] HospitalSERVICES [address]RF[address] [place] [address]Phone: [phone]  Fax:  [phone] Name: SCAY KATHMRN: [id number] of Birth: 1April 21, 1947

## 2019-07-16 NOTE — Therapy (Signed)
[facility] MAIN [facility] SERVICES [address] [place], [place], [address] Phone: [phone]   Fax:  [phone]  Occupational Therapy Treatment  Patient Details  Name: Ariel Alvarez MRN: [id number] Date of Birth: Mar 11, 1946 No data recorded  Encounter Date: 07/16/2019  OT End of Session - 07/16/19 0940    Visit Number  11    Number of Visits  24    Date for OT Re-Evaluation  08/18/19    Authorization Type  Progress report period starting 917/2020    OT Start Time  0845    OT Stop Time  0930    OT Time Calculation (min)  45 min    Equipment Utilized During Treatment  LUE ROM, self-ROM, RUE grip strength, and coordination    Activity Tolerance  Patient tolerated treatment well    Behavior During Therapy  [facility] for tasks assessed/performed;Anxious       Past Medical History:  Diagnosis Date  . Anxiety   . Collagen vascular disease ([facility])   . Depression   . Stroke [facility])     Past Surgical History:  Procedure Laterality Date  . CHOLECYSTECTOMY      There were no vitals filed for this visit.  Subjective Assessment - 07/16/19 0939    Subjective   Pt. hopes that she will be able to go to [facility].    Patient is accompanied by:  Family member    Pertinent History  Pt. is a 73 y.o. female who sustained multiple CVAs with left hemiplegia, Pt. PMHx includes: Depression, Collagen Vascular Disease, Anxiety. Pt. recieved therapy services in thre past. Pt. resides at home with her husband who is supportive, and assists with her care. Pt. is alone during the day, as her husband works.    Patient Stated Goals  To be stronger, be able to take care of myself while my husband works.    Currently in Pain?  No/denies      OT TREATMENT    Therapeutic Exercise:  Pt. tolerated PROM, with slow gentle stretching ofthe LUE for shoulder flexion, abduction, elbow flexion, extension, forearm supination, wrist extension, flexion, digit MP, PIP, and  DIP flexion, extension, thumb abduction, and thumb PIP extension. Pt. had limited tolerance for shoulder flexion, and abduction today. Pt. education was provided about LUE positioning. Pt. education was provided about clipping her finger nails to maintain good left hand palmar skin integrity.   Neuromuscular re-education:  Pt. worked on right hand [facility] skills grasping 1" resistive cubes from a board positioned flat at a tabletop. Pt. required verbal cues for thumb, and 2nd digit hand position.   Response to Treatment:  Pt. presents with increased flexor tone, tightness, and spasticity in the LUE, and hand. Pt. is responding well to ROM, and slow gentle prolonged stretching. Pt. requires visual demonstration, verbal cues, and hand-over-hand assist for self-ROM. Pt. continues to work improving LUE ROM, and RUE grip strength, pinch strength, and FMC skills in order to improve engagement in ADLs, and IADLs.                     OT Education - 07/16/19 0939    Education Details  HEP, strength, fine motor, ROM    [name]  Patient    Methods  Explanation;Demonstration;Verbal cues    Comprehension  Verbal cues required;Returned demonstration;Verbalized understanding          OT Long Term Goals - 07/14/19 9518      OT  LONG TERM GOAL #1   Title  Pt. will improve RUE strength to be be able to reach into cabinetry to put items away without compensating posteriorly with her trunk.    Baseline  Pt. continues to lean posteriorly when reaching with her RUE    Time  12    Period  Weeks    Status  On-going    Target Date  08/18/19      OT LONG TERM GOAL #2   Title  Pt. will independently complete light meal preparation in standing    Baseline  Pt. continues to have difficulty    Time  12    Period  Weeks    Status  On-going    Target Date  08/18/19      OT LONG TERM GOAL #3   Title  Pt. will improve right hand [facility] skills to be able to independently apply hook  earrings.    Baseline  Pt. continues to be unable to place earrings.    Time  12    Period  Weeks    Status  On-going    Target Date  08/18/19      OT LONG TERM GOAL #4   Title  Pt. will improve right grip strength to be able to hold a can at the can opener    Baseline  Pt. continues to be unable to hold, and use a can opener    Time  12    Period  Weeks    Status  New    Target Date  08/18/19      OT LONG TERM GOAL #5   Title  Pt. will require supervision to sweep the floor.    Baseline  Pt. is unable    Time  12    Period  Weeks    Status  On-going    Target Date  08/18/19            Plan - 07/16/19 0940    Clinical Impression Statement [place]. [place]. Pt. reports that she still needs to go into her attic to get a box that has her left resting hand splint. Pt. presents with increased flexor tone, tightness, and spasticity in the LUE, and hand. Pt. is responding well to ROM, and slow gentle prolonged stretching. Pt. requires visual demonstration, verbal cues, and hand-over-hand assist for self-ROM. Pt. continues to work improving LUE ROM, and RUE grip strength, pinch strength, and FMC skills in order to improve engagement in ADLs, and IADLs.    OT Occupational Profile and History  Comprehensive Assessment- Review of records and extensive additional review of physical, cognitive, psychosocial history related to current functional performance    Occupational performance deficits (Please refer to evaluation for details):  ADL's;IADL's    Body Structure / Function / Physical Skills  ADL;IADL;FMC;ROM;Pain;UE functional use;Strength;Dexterity    Rehab Potential  Good    Clinical Decision Making  Several treatment options, min-mod task modification necessary    Comorbidities Affecting Occupational Performance:  Presence of comorbidities impacting occupational performance    Modification or Assistance to Complete  Evaluation   Min-Moderate modification of tasks or assist with assess necessary to complete eval    OT Frequency  2x / week    OT Duration  12 weeks    OT Treatment/Interventions  Self-care/ADL training;Neuromuscular education;Manual Therapy;Therapeutic activities;Therapeutic exercise;Patient/family education    OT Home Exercise Plan  Self ROM exercises tabletop  Consulted and Agree with Plan of Care  Patient       Patient will benefit from skilled therapeutic intervention in order to improve the following deficits and impairments:   Body Structure / Function / Physical Skills: ADL, IADL, FMC, ROM, Pain, UE functional use, Strength, Dexterity       Visit Diagnosis: Muscle weakness (generalized)  Other lack of coordination    Problem List Patient Active Problem List   Diagnosis Date Noted  . Collagen vascular disease ([place]) 05/21/2017  . Gangrene of finger of right hand ([place]) 05/21/2017    Harrel Carina, MS, OTR/L 07/16/2019, 9:53 AM  [place] MAIN [facility] SERVICES [address] [place], [place], [address] Phone: [phone]   Fax:  [phone]  Name: Ariel Alvarez MRN: [id number] Date of Birth: May 24, 1946

## 2019-07-21 ENCOUNTER — Ambulatory Visit: Payer: Medicare Other | Admitting: Occupational Therapy

## 2019-07-21 ENCOUNTER — Ambulatory Visit: Payer: Medicare Other | Admitting: Physical Therapy

## 2019-07-23 ENCOUNTER — Ambulatory Visit: Payer: Medicare Other | Admitting: Physical Therapy

## 2019-07-23 ENCOUNTER — Ambulatory Visit: Payer: Medicare Other | Admitting: Occupational Therapy

## 2019-07-28 ENCOUNTER — Ambulatory Visit: Payer: Medicare Other | Admitting: Occupational Therapy

## 2019-07-28 ENCOUNTER — Encounter: Payer: Self-pay | Admitting: Occupational Therapy

## 2019-07-28 ENCOUNTER — Ambulatory Visit: Payer: Medicare Other | Admitting: Physical Therapy

## 2019-07-28 ENCOUNTER — Other Ambulatory Visit: Payer: Self-pay

## 2019-07-28 ENCOUNTER — Encounter: Payer: Self-pay | Admitting: Physical Therapy

## 2019-07-28 DIAGNOSIS — R2689 Other abnormalities of gait and mobility: Secondary | ICD-10-CM

## 2019-07-28 DIAGNOSIS — M6281 Muscle weakness (generalized): Secondary | ICD-10-CM

## 2019-07-28 DIAGNOSIS — R278 Other lack of coordination: Secondary | ICD-10-CM

## 2019-07-28 NOTE — Therapy (Signed)
[place] MAIN [facility] SERVICES [address] [place], [place], [address] Phone: [phone]   Fax:  [phone]  Physical Therapy Treatment  Patient Details  Name: Ariel Alvarez MRN: [id number] Date of Birth: 1946-10-05 Referring Provider (PT): Okey Regal   Encounter Date: 07/28/2019  PT End of Session - 07/28/19 0848    Visit Number  23    Number of Visits  33    Date for PT Re-Evaluation  08/05/19    PT Start Time  0800    PT Stop Time  0841    PT Time Calculation (min)  41 min    Equipment Utilized During Treatment  Gait belt;Other (comment)   Hemiwalker   Activity Tolerance  Patient limited by fatigue;Other (comment)    Behavior During Therapy  [facility] for tasks assessed/performed;Anxious       Past Medical History:  Diagnosis Date  . Anxiety   . Collagen vascular disease ([place])   . Depression   . Stroke [facility])     Past Surgical History:  Procedure Laterality Date  . CHOLECYSTECTOMY      There were no vitals filed for this visit.  Subjective Assessment - 07/28/19 0846    Subjective  Patient reports that her feet have develped some ulcers on her big toe bilaterally.She had MD appointment but MD doesnt want to do surgery on bunions.    Patient is accompained by:  Family member    Pertinent History  Patient presents for instability s/p CVA in 01/2017. Patient was recently diagnosed with osteoporosis in bilateral legs and feet. Is potentially getting surgery in 2 weeks on both feet. After discharge from outpatient physical therapy had one session with home health PT but no other therapy. Reports no falls. Reports walking from living room to kitchen and back to walkway however her LLE gives out frequently . Per husband and patient she was going to an adult day center prior to [place]. Patient has a flat/level entry. Trying to get a power chair at this time from [employer]. Has a manual chair at this time, walker, hemi  walker, quad cane. Doing bird baths but has a walk in tub she can't get in, does have a shower chair. Patient and husband aware that patient will need caregiver with her due to toileting needs.    Limitations  Sitting;Lifting;Standing;Walking;House hold activities    How long can you sit comfortably?  recliner 5 hours, 30 minute sin wheelchair    How long can you stand comfortably?  4 minutes with UE support    How long can you walk comfortably?  15 ft    Patient Stated Goals  to walk better    Currently in Pain?  Yes    Pain Score  3     Pain Location  Foot    Pain Orientation  Right;Left    Pain Descriptors / Indicators  Aching    Pain Type  Chronic pain    Pain Onset  More than a month ago    Pain Frequency  Constant    Aggravating Factors   standing and walking    Effect of Pain on Daily Activities  limits amount of walking    Multiple Pain Sites  No         Treatment  PROM to left ankle for calf stretch x 30 sec x 5 , no pain reported  Transfer <>multi level surfaces <>nu-step withCGAassist x4transfers, Patients performance improves with practice and  cues to lean fwd improves quality of transfer  Transfers sit to standx 5with hemiwalker andCGAassist and Vc for safety and sequencing, patient improves with practice  Gait training with hemiwalker 40 feet x 4 with wc following and cGA and VC for sequencing and safety                     PT Education - 07/28/19 0848    Education Details  HEP    [name]  Patient    Methods  Explanation;Demonstration;Tactile cues;Verbal cues    Comprehension  Returned demonstration;Need further instruction;Verbal cues required       PT Short Term Goals - 05/21/19 1446      PT SHORT TERM GOAL #1   Title  Patient will perform one sit to stand independently from wheelchair to increase independence with transfers    Baseline  6/17: min/mod A    Time  2    Period  Weeks    Status  Partially Met     Target Date  04/29/19      PT SHORT TERM GOAL #2   Title  Patient will be independent in home exercise program to improve strength/mobility for better functional independence with ADLs.    Baseline  HEP to be given next session    Time  2    Status  Partially Met    Target Date  04/29/19        PT Long Term Goals - 07/02/19 0954      PT LONG TERM GOAL #1   Title  Patient will perform 5 STS < 40 seconds independently with hemi walker to demonstrate improved home mobility.    Baseline  6/17: unable to perform one time without Min/Mod A, 05/21/19 1.06 sec: 07/02/19=56 sec    Time  8    Period  Weeks    Status  Partially Met    Target Date  08/05/19      PT LONG TERM GOAL #2   Title  Patient will stand without UE support for 3-5 minutes for ADL performance without LOB to increase independence with dressing and toileting.    Baseline  6/17: unable to stand without UE support, 05/21/19 1 min 30 sec:07/02/19= 3 min    Time  8    Period  Weeks    Status  New    Target Date  08/05/19      PT LONG TERM GOAL #3   Title  Patient will ambulate 60 ft with hemi walker and CGA to improve home mobility and independence with gait.     Baseline  6/17: 10 ft, 05/21/19 17 feet: 07/02/19 = 50 feet    Time  8    Period  Weeks    Status  Partially Met    Target Date  08/05/19      PT LONG TERM GOAL #4   Title  Patient will increase BLE gross strength to 4+/5 as to improve functional strength for independent gait, increased standing tolerance and increased ADL ability    Baseline  6/17: hips grossly 2+/5 knees 3+/5:     07/02/19: R hip  flex -3/5, L hip flex 2/5 knees 3+/5    Time  8    Period  Weeks    Status  Partially Met    Target Date  08/05/19            Plan - 07/28/19 0849    Clinical Impression Statement  Patient needs  instruction and cues for hand position during transfers for saftey; practiced sit to stand transfer from multiple levels and seating positions with min assist. She performs  gait wiht hemi walker for 40 feet x 4 and wc following. She responds to left heel cord stretch to allow better ankle motion during swing phase for LLE. She will continue to benefit from skilled PT to improve mobility and safety.    Personal Factors and Comorbidities  Behavior Pattern;Comorbidity 3+;Fitness;Past/Current Experience;Social Background;Time since onset of injury/illness/exacerbation;Transportation    Comorbidities  anxiety, collagen vascular disease, depression, stroke, amputated digit    Examination-Activity Limitations  Bathing;Bed Mobility;Bend;Caring for Others;Carry;Continence;Dressing;Sleep;Sit;Reach Overhead;Locomotion Level;Lift;Hygiene/Grooming;Squat;Stairs;Stand;Toileting;Transfers    Examination-Participation Restrictions  Church;Cleaning;Community Activity;Driving;Interpersonal Relationship;Meal Prep;Medication Management;Laundry;Shop;Volunteer;Yard Work    [employer]  Evolving/Moderate complexity    Rehab Potential  Fair    PT Frequency  2x / week    PT Treatment/Interventions  ADLs/Self Care Home Management;Aquatic Therapy;Cryotherapy;Biofeedback;Electrical Stimulation;Iontophoresis 56m/ml Dexamethasone;Moist Heat;Traction;Ultrasound;Functional mobility training;Stair training;Gait training;DME Instruction;Therapeutic activities;Therapeutic exercise;Balance training;Neuromuscular re-education;Orthotic Fit/Training;Patient/family education;Cognitive remediation;Wheelchair mobility training;Manual techniques;Taping;Splinting;Energy conservation    PT Next Visit Plan  give HEP, practice SPT, ambulation, COM    PT Home Exercise Plan  Reviewed LAQs and alt hip flexion with pt today; pt able to verbalize and demonstrate understanding       Patient will benefit from skilled therapeutic intervention in order to improve the following deficits and impairments:  Abnormal gait, Decreased activity tolerance, Decreased coordination, Decreased balance, Decreased  endurance, Decreased knowledge of precautions, Decreased knowledge of use of DME, Decreased safety awareness, Decreased range of motion, Decreased mobility, Decreased strength, Difficulty walking, Hypomobility, Increased fascial restricitons, Impaired flexibility, Impaired perceived functional ability, Impaired sensation, Impaired tone, Impaired UE functional use, Postural dysfunction, Improper body mechanics, Pain, Obesity  Visit Diagnosis: Muscle weakness (generalized)  Other lack of coordination  Other abnormalities of gait and mobility     Problem List Patient Active Problem List   Diagnosis Date Noted  . Collagen vascular disease ([place] 05/21/2017  . Gangrene of finger of right hand ([facility] 05/21/2017    MAlanson Puls, PVirginiaDPT 07/28/2019, 8:53 AM  [place] [facility] [address] [address]Phone: [phone]  Fax:  [phone] Name: SRISHIKA MCCOLLOMMRN: [id number] of Birth: 107-03-1946

## 2019-07-28 NOTE — Therapy (Addendum)
[place] MAIN [facility] SERVICES [address] [place], [place], [address] Phone: [phone]   Fax:  [phone]  Occupational Therapy Treatment  Patient Details  Name: Ariel Alvarez MRN: [id number] Date of Birth: 30-Nov-1945 No data recorded  Encounter Date: 07/28/2019  OT End of Session - 07/28/19 1030    Visit Number  12    Number of Visits  24    Date for OT Re-Evaluation  08/18/19    Authorization Type  Progress report period starting 917/2020    OT Start Time  0845    OT Stop Time  0930    OT Time Calculation (min)  45 min    Equipment Utilized During Treatment  LUE ROM, self-ROM, RUE grip strength, and coordination    Activity Tolerance  Patient tolerated treatment well    Behavior During Therapy  [facility] for tasks assessed/performed;Anxious       Past Medical History:  Diagnosis Date  . Anxiety   . Collagen vascular disease ([place])   . Depression   . Stroke [facility])     Past Surgical History:  Procedure Laterality Date  . CHOLECYSTECTOMY      There were no vitals filed for this visit.  Subjective Assessment - 07/28/19 1028    Subjective   Pt. hopes that she will be able to go to in a couple of weeks to [facility], as her first plans got rained out.    Patient is accompanied by:  Family member    Pertinent History  Pt. is a 73 y.o. female who sustained multiple CVAs with left hemiplegia, Pt. PMHx includes: Depression, Collagen Vascular Disease, Anxiety. Pt. recieved therapy services in thre past. Pt. resides at home with her husband who is supportive, and assists with her care. Pt. is alone during the day, as her husband works.    Currently in Pain?  No/denies       OT TREATMENT    Therapeutic Exercise:  Pt. tolerated slow gentle stretching of the LUE for shoulder flexion, abduction, elbow flexion, extension, forearm supination, pronation, wrist flexion, extension, digit MP, PIP, DIP flexion, and extension, thumb radial,  and palmar abduction, and thumb IP flexion, and extension. No complaints of pain.  Splint fitting was assessed. Pt.'s Current splints from home will not be appropriate for the pt. Secondary to increased digit MP, and PIP flexor tone, and digit DIP hyperextension placing the pads/tips of the digits at risk for skin breakdown.  Response to Treatment  Pt. brought in 2 different resting hand splints from home. Attempted splint fitting after prolonged gentle stretching of the Left UE shoulder, elbow, wrist, and digits. Pt. continues to present with increased flexor ton, tightness, and spasticity in the left hand, and digits. Pt. continues to work on improving UE functioning in preparation for ADLs, and IADL tasks. Pt. could benefit from a palm protector for the left hand to promote good skin integrity through the palmar surface of her hand.                        OT Education - 07/28/19 1030    Education Details  HEP, strength, fine motor, ROM    [name]  Patient    Methods  Explanation;Demonstration;Verbal cues    Comprehension  Verbal cues required;Returned demonstration;Verbalized understanding          OT Long Term Goals - 07/14/19 0952      OT LONG TERM GOAL #1  Title  Pt. will improve RUE strength to be be able to reach into cabinetry to put items away without compensating posteriorly with her trunk.    Baseline  Pt. continues to lean posteriorly when reaching with her RUE    Time  12    Period  Weeks    Status  On-going    Target Date  08/18/19      OT LONG TERM GOAL #2   Title  Pt. will independently complete light meal preparation in standing    Baseline  Pt. continues to have difficulty    Time  12    Period  Weeks    Status  On-going    Target Date  08/18/19      OT LONG TERM GOAL #3   Title  Pt. will improve right hand [facility] skills to be able to independently apply hook earrings.    Baseline  Pt. continues to be unable to place earrings.     Time  12    Period  Weeks    Status  On-going    Target Date  08/18/19      OT LONG TERM GOAL #4   Title  Pt. will improve right grip strength to be able to hold a can at the can opener    Baseline  Pt. continues to be unable to hold, and use a can opener    Time  12    Period  Weeks    Status  New    Target Date  08/18/19      OT LONG TERM GOAL #5   Title  Pt. will require supervision to sweep the floor.    Baseline  Pt. is unable    Time  12    Period  Weeks    Status  On-going    Target Date  08/18/19            Plan - 07/28/19 1030    Clinical Impression Statement  Pt. brought in 2 different resting hand splints from home. Attempted splint fitting after prolonged gentle stretching of the Left UE shoulder, elbow, wrist, and digits. Pt. continues to present with increased flexor ton, tightness, and spasticity in the left hand, and digits. Pt. continues to work on improving UE functioning in preparation for ADLs, and IADL tasks.    OT Occupational Profile and History  Comprehensive Assessment- Review of records and extensive additional review of physical, cognitive, psychosocial history related to current functional performance    Occupational performance deficits (Please refer to evaluation for details):  ADL's;IADL's    Body Structure / Function / Physical Skills  ADL;IADL;FMC;ROM;Pain;UE functional use;Strength;Dexterity    Rehab Potential  Good    Clinical Decision Making  Several treatment options, min-mod task modification necessary    Comorbidities Affecting Occupational Performance:  Presence of comorbidities impacting occupational performance    Modification or Assistance to Complete Evaluation   Min-Moderate modification of tasks or assist with assess necessary to complete eval    OT Frequency  2x / week    OT Duration  12 weeks    OT Treatment/Interventions  Self-care/ADL training;Neuromuscular education;Manual Therapy;Therapeutic activities;Therapeutic  exercise;Patient/family education    OT Home Exercise Plan  Self ROM exercises tabletop    Consulted and Agree with Plan of Care  Patient       Patient will benefit from skilled therapeutic intervention in order to improve the following deficits and impairments:   Body Structure / Function / Physical Skills: ADL,  IADL, FMC, ROM, Pain, UE functional use, Strength, Dexterity       Visit Diagnosis: Muscle weakness (generalized)  Other lack of coordination    Problem List Patient Active Problem List   Diagnosis Date Noted  . Collagen vascular disease ([facility]) 05/21/2017  . 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) 05/21/2017    Olegario MessierElaine Lindel Marcell, MS, OTR/L 07/28/2019, 10:42 AM  [place] [facility] [address] [place], [place], [address] Phone: [phone]   Fax:  [phone]  Name: Ariel Alvarez MRN: [id number] Date of Birth: 08/20/1946

## 2019-07-30 ENCOUNTER — Ambulatory Visit: Payer: Medicare Other | Attending: Internal Medicine | Admitting: Physical Therapy

## 2019-07-30 ENCOUNTER — Other Ambulatory Visit: Payer: Self-pay

## 2019-07-30 ENCOUNTER — Ambulatory Visit: Payer: Medicare Other | Admitting: Occupational Therapy

## 2019-07-30 ENCOUNTER — Encounter: Payer: Self-pay | Admitting: Occupational Therapy

## 2019-07-30 ENCOUNTER — Encounter: Payer: Self-pay | Admitting: Physical Therapy

## 2019-07-30 DIAGNOSIS — R278 Other lack of coordination: Secondary | ICD-10-CM | POA: Diagnosis present

## 2019-07-30 DIAGNOSIS — M6281 Muscle weakness (generalized): Secondary | ICD-10-CM | POA: Diagnosis not present

## 2019-07-30 DIAGNOSIS — R2689 Other abnormalities of gait and mobility: Secondary | ICD-10-CM | POA: Insufficient documentation

## 2019-07-30 NOTE — Therapy (Signed)
[place] [facility] MAIN REHAB SERVICES [address] [place], [place], [address] Phone: [phone]   Fax:  [phone]  Physical Therapy Treatment  Patient Details  Name: Ariel Alvarez MRN: [id number] Date of Birth: 09/29/1946 Referring Provider (PT): Jennifer McCauley   Encounter Date: 07/30/2019  PT End of Session - 07/30/19 0855    Visit Number  24    Number of Visits  33    Date for PT Re-Evaluation  08/05/19    PT Start Time  0800    PT Stop Time  0840    PT Time Calculation (min)  40 min    Equipment Utilized During Treatment  Gait belt;Other (comment)   Hemiwalker   Activity Tolerance  Patient limited by fatigue;Other (comment)    Behavior During Therapy  [facility] for tasks assessed/performed;Anxious       Past Medical History:  Diagnosis Date  . Anxiety   . Collagen vascular disease ([facility])   . Depression   . Stroke ([facility])     Past Surgical History:  Procedure Laterality Date  . CHOLECYSTECTOMY      There were no vitals filed for this visit.  Subjective Assessment - 07/30/19 0854    Subjective  Patient reports that her feet have develped some ulcers on her big toe bilaterally.She had MD appointment but MD doesnt want to do surgery on bunions.    Patient is accompained by:  Family member    Pertinent History  Patient presents for instability s/p CVA in 01/2017. Patient was recently diagnosed with osteoporosis in bilateral legs and feet. Is potentially getting surgery in 2 weeks on both feet. After discharge from outpatient physical therapy had one session with home health PT but no other therapy. Reports no falls. Reports walking from living room to kitchen and back to walkway however her LLE gives out frequently . Per husband and patient she was going to an adult day center prior to [place]. Patient has a flat/level entry. Trying to get a power chair at this time from [employer]. Has a manual chair at this time, walker, hemi  walker, quad cane. Doing bird baths but has a walk in tub she can't get in, does have a shower chair. Patient and husband aware that patient will need caregiver with her due to toileting needs.    Limitations  Sitting;Lifting;Standing;Walking;House hold activities    How long can you sit comfortably?  recliner 5 hours, 30 minute sin wheelchair    How long can you stand comfortably?  4 minutes with UE support    How long can you walk comfortably?  15 ft    Patient Stated Goals  to walk better    Currently in Pain?  No/denies    Pain Score  0-No pain    Pain Onset  More than a month ago          Treatment  PROM to left ankle for calf stretch x 30 sec x 5 , no pain reported  Transfer <>multi level surfaces <>nu-step withCGAassist x4transfers, Patients performance improves with practice and cues to lean fwd improves quality of transfer  Transfers sit to standx 5with hemiwalker andCGAassist and Vc for safety and sequencing, patient improves with practice  Gait training with hemiwalker 40 feet x 4 with wc following and cGA and VC for sequencing and safety  side stepping in parallel bars x 2 laps with vC to keep feet in correct positions.                       PT Education - 07/30/19 0855    Education Details  HEP    [name]  Patient    Methods  Explanation;Demonstration;Tactile cues;Verbal cues    Comprehension  Verbalized understanding;Returned demonstration       PT Short Term Goals - 05/21/19 1446      PT SHORT TERM GOAL #1   Title  Patient will perform one sit to stand independently from wheelchair to increase independence with transfers    Baseline  6/17: min/mod A    Time  2    Period  Weeks    Status  Partially Met    Target Date  04/29/19      PT SHORT TERM GOAL #2   Title  Patient will be independent in home exercise program to improve strength/mobility for better functional independence with ADLs.    Baseline  HEP to be given  next session    Time  2    Status  Partially Met    Target Date  04/29/19        PT Long Term Goals - 07/02/19 0954      PT LONG TERM GOAL #1   Title  Patient will perform 5 STS < 40 seconds independently with hemi walker to demonstrate improved home mobility.    Baseline  6/17: unable to perform one time without Min/Mod A, 05/21/19 1.06 sec: 07/02/19=56 sec    Time  8    Period  Weeks    Status  Partially Met    Target Date  08/05/19      PT LONG TERM GOAL #2   Title  Patient will stand without UE support for 3-5 minutes for ADL performance without LOB to increase independence with dressing and toileting.    Baseline  6/17: unable to stand without UE support, 05/21/19 1 min 30 sec:07/02/19= 3 min    Time  8    Period  Weeks    Status  New    Target Date  08/05/19      PT LONG TERM GOAL #3   Title  Patient will ambulate 60 ft with hemi walker and CGA to improve home mobility and independence with gait.     Baseline  6/17: 10 ft, 05/21/19 17 feet: 07/02/19 = 50 feet    Time  8    Period  Weeks    Status  Partially Met    Target Date  08/05/19      PT LONG TERM GOAL #4   Title  Patient will increase BLE gross strength to 4+/5 as to improve functional strength for independent gait, increased standing tolerance and increased ADL ability    Baseline  6/17: hips grossly 2+/5 knees 3+/5:     07/02/19: R hip  flex -3/5, L hip flex 2/5 knees 3+/5    Time  8    Period  Weeks    Status  Partially Met    Target Date  08/05/19            Plan - 07/30/19 1020    Clinical Impression Statement  Patient limited in session today due to  from pain and and weakness. Unable to perform standing exercises today due to pain and weakness, but was able to perform standing exercises with no increased pain. Patient will continue to benefit from skilled physical therapy to improve pain and mobility.    Personal Factors and Comorbidities  Behavior Pattern;Comorbidity 3+;Fitness;Past/Current  Experience;Social Background;Time since onset of injury/illness/exacerbation;Transportation    Comorbidities    anxiety, collagen vascular disease, depression, stroke, amputated digit    Examination-Activity Limitations  Bathing;Bed Mobility;Bend;Caring for Others;Carry;Continence;Dressing;Sleep;Sit;Reach Overhead;Locomotion Level;Lift;Hygiene/Grooming;Squat;Stairs;Stand;Toileting;Transfers    Examination-Participation Restrictions  Church;Cleaning;Community Activity;Driving;Interpersonal Relationship;Meal Prep;Medication Management;Laundry;Shop;Volunteer;Yard Work    Stability/Clinical Decision Making  Evolving/Moderate complexity    Rehab Potential  Fair    PT Frequency  2x / week    PT Treatment/Interventions  ADLs/Self Care Home Management;Aquatic Therapy;Cryotherapy;Biofeedback;Electrical Stimulation;Iontophoresis 4mg/ml Dexamethasone;Moist Heat;Traction;Ultrasound;Functional mobility training;Stair training;Gait training;DME Instruction;Therapeutic activities;Therapeutic exercise;Balance training;Neuromuscular re-education;Orthotic Fit/Training;Patient/family education;Cognitive remediation;Wheelchair mobility training;Manual techniques;Taping;Splinting;Energy conservation    PT Next Visit Plan  give HEP, practice SPT, ambulation, COM    PT Home Exercise Plan  Reviewed LAQs and alt hip flexion with pt today; pt able to verbalize and demonstrate understanding       Patient will benefit from skilled therapeutic intervention in order to improve the following deficits and impairments:  Abnormal gait, Decreased activity tolerance, Decreased coordination, Decreased balance, Decreased endurance, Decreased knowledge of precautions, Decreased knowledge of use of DME, Decreased safety awareness, Decreased range of motion, Decreased mobility, Decreased strength, Difficulty walking, Hypomobility, Increased fascial restricitons, Impaired flexibility, Impaired perceived functional ability, Impaired sensation,  Impaired tone, Impaired UE functional use, Postural dysfunction, Improper body mechanics, Pain, Obesity  Visit Diagnosis: Muscle weakness (generalized)  Other lack of coordination  Other abnormalities of gait and mobility     Problem List Patient Active Problem List   Diagnosis Date Noted  . Collagen vascular disease ([facility]) 05/21/2017  . 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) 05/21/2017    ,  S, PT DPT 07/30/2019, 10:21 AM  [place] [facility] MAIN REHAB SERVICES [address] [place], [place], [address] Phone: [phone]   Fax:  [phone]  Name: Ariel Alvarez MRN: [id number] Date of Birth: 01/23/1946   

## 2019-07-30 NOTE — Therapy (Signed)
[place] MAIN [facility] SERVICES [address] [place], [place], [address] Phone: [phone]   Fax:  [phone]  Occupational Therapy Treatment  Patient Details  Name: Ariel Alvarez MRN: [id number] Date of Birth: 1945/12/07 No data recorded  Encounter Date: 07/30/2019  OT End of Session - 07/30/19 0930    Visit Number  13    Number of Visits  24    Date for OT Re-Evaluation  08/18/19    Authorization Type  Progress report period starting 917/2020    OT Start Time  0840    OT Stop Time  0925    OT Time Calculation (min)  45 min    Equipment Utilized During Treatment  LUE ROM, self-ROM, RUE grip strength, and coordination    Activity Tolerance  Patient tolerated treatment well    Behavior During Therapy  [facility] for tasks assessed/performed;Anxious       Past Medical History:  Diagnosis Date  . Anxiety   . Collagen vascular disease ([place])   . Depression   . Stroke [facility])     Past Surgical History:  Procedure Laterality Date  . CHOLECYSTECTOMY      There were no vitals filed for this visit.  Subjective Assessment - 07/30/19 0930    Subjective   Pt. reports planning to have her hair, and nails done this weekend.    Patient is accompanied by:  Family member    Pertinent History  Pt. is a 73 y.o. female who sustained multiple CVAs with left hemiplegia, Pt. PMHx includes: Depression, Collagen Vascular Disease, Anxiety. Pt. recieved therapy services in thre past. Pt. resides at home with her husband who is supportive, and assists with her care. Pt. is alone during the day, as her husband works.    Currently in Pain?  No/denies   Reports Left shoulder discomfort initially with ROM, Not rated, improves with ROM.      OT TREATMENT    Neuro muscular re-education:  Pt. worked on [facility] skills with her right hand following design patterns. Pt. was able to to grasp the 1/2" pegs without dropping them, however required verbal cues to follow the  design pattern.   Therapeutic Exercise:  Pt. performed right gross gripping with grip strengthener. Pt. worked on sustaining grip while grasping pegs and reaching at various heights. Pt. worked on 2 reps. For the 1st rep the grip strengthener was placed at 11.2# resistive forces, followed by a rep of 17.9#. Pt. tolerated PROM in all joint ranges of the LUE, and hand. Pt. Tolerated PROM in all joint ranges of the LUE, and hand.                          OT Education - 07/30/19 0930    Education Details  HEP, strength, fine motor, ROM    [name]  Patient    Methods  Explanation;Demonstration;Verbal cues    Comprehension  Verbal cues required;Returned demonstration;Verbalized understanding          OT Long Term Goals - 07/14/19 0952      OT LONG TERM GOAL #1   Title  Pt. will improve RUE strength to be be able to reach into cabinetry to put items away without compensating posteriorly with her trunk.    Baseline  Pt. continues to lean posteriorly when reaching with her RUE    Time  12    Period  Weeks    Status  On-going  Target Date  08/18/19      OT LONG TERM GOAL #2   Title  Pt. will independently complete light meal preparation in standing    Baseline  Pt. continues to have difficulty    Time  12    Period  Weeks    Status  On-going    Target Date  08/18/19      OT LONG TERM GOAL #3   Title  Pt. will improve right hand [facility] skills to be able to independently apply hook earrings.    Baseline  Pt. continues to be unable to place earrings.    Time  12    Period  Weeks    Status  On-going    Target Date  08/18/19      OT LONG TERM GOAL #4   Title  Pt. will improve right grip strength to be able to hold a can at the can opener    Baseline  Pt. continues to be unable to hold, and use a can opener    Time  12    Period  Weeks    Status  New    Target Date  08/18/19      OT LONG TERM GOAL #5   Title  Pt. will require supervision to sweep  the floor.    Baseline  Pt. is unable    Time  12    Period  Weeks    Status  On-going    Target Date  08/18/19            Plan - 07/30/19 0931    Clinical Impression Statement  Pt. conitnues to present with increased flexor tone, tightness, and spasticity in her left UE, and hand. Pt. reports discomfort initially with shoulder flexion, and abduction today, reporting that she slept on it wrong last night. Disconfort improved as ROM progressed. Pt. continues to respond well to ROM with her LUE. Pt. continues to work on improving RUE grip strength, pinch strength, and [facility] skills for improved functional hand use during ADLs, and IADLs.    OT Occupational Profile and History  Comprehensive Assessment- Review of records and extensive additional review of physical, cognitive, psychosocial history related to current functional performance    Occupational performance deficits (Please refer to evaluation for details):  ADL's;IADL's    Body Structure / Function / Physical Skills  ADL;IADL;FMC;ROM;Pain;UE functional use;Strength;Dexterity    Rehab Potential  Good    Clinical Decision Making  Several treatment options, min-mod task modification necessary    Comorbidities Affecting Occupational Performance:  Presence of comorbidities impacting occupational performance    Comorbidities impacting occupational performance description:  histroy of strokes, obesity, decreased balance    Modification or Assistance to Complete Evaluation   Min-Moderate modification of tasks or assist with assess necessary to complete eval    OT Frequency  2x / week    OT Duration  12 weeks    OT Treatment/Interventions  Self-care/ADL training;Neuromuscular education;Manual Therapy;Therapeutic activities;Therapeutic exercise;Patient/family education    OT Home Exercise Plan  Self ROM exercises tabletop    Consulted and Agree with Plan of Care  Patient       Patient will benefit from skilled therapeutic intervention in  order to improve the following deficits and impairments:   Body Structure / Function / Physical Skills: ADL, IADL, FMC, ROM, Pain, UE functional use, Strength, Dexterity       Visit Diagnosis: Muscle weakness (generalized)  Other lack of coordination    Problem List  Patient Active Problem List   Diagnosis Date Noted  . Collagen vascular disease (HCC) 05/21/2017  . 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) 05/21/2017    Olegario Messier, MS, OTR/L 07/30/2019, 11:34 AM  [place] [facility] MAIN [facility] SERVICES [address] [place], [place], [address] Phone: [phone]   Fax:  [phone]  Name: ADLINE MILILLO MRN: [id number] Date of Birth: 12-Aug-1946

## 2019-08-04 ENCOUNTER — Encounter: Payer: Self-pay | Admitting: Physical Therapy

## 2019-08-04 ENCOUNTER — Ambulatory Visit: Payer: Medicare Other | Admitting: Physical Therapy

## 2019-08-04 ENCOUNTER — Other Ambulatory Visit: Payer: Self-pay

## 2019-08-04 ENCOUNTER — Ambulatory Visit: Payer: Medicare Other | Admitting: Occupational Therapy

## 2019-08-04 ENCOUNTER — Encounter: Payer: Self-pay | Admitting: Occupational Therapy

## 2019-08-04 DIAGNOSIS — M6281 Muscle weakness (generalized): Secondary | ICD-10-CM | POA: Diagnosis not present

## 2019-08-04 DIAGNOSIS — R278 Other lack of coordination: Secondary | ICD-10-CM

## 2019-08-04 DIAGNOSIS — R2689 Other abnormalities of gait and mobility: Secondary | ICD-10-CM

## 2019-08-04 NOTE — Therapy (Signed)
[place] MAIN [facility] SERVICES [address] [place], [place], [address] Phone: [phone]   Fax:  [phone]  Occupational Therapy Treatment  Patient Details  Name: Ariel Alvarez MRN: [id number] Date of Birth: 18-Nov-1945 No data recorded  Encounter Date: 08/04/2019  OT End of Session - 08/04/19 1157    Visit Number  14    Number of Visits  24    Date for OT Re-Evaluation  08/18/19    Authorization Type  Progress report period starting 917/2020    OT Start Time  0845    OT Stop Time  0930    OT Time Calculation (min)  45 min    Equipment Utilized During Treatment  LUE ROM, self-ROM, RUE grip strength, and coordination    Activity Tolerance  Patient tolerated treatment well    Behavior During Therapy  WFL for tasks assessed/performed       Past Medical History:  Diagnosis Date  . Anxiety   . Collagen vascular disease ([place])   . Depression   . Stroke [facility])     Past Surgical History:  Procedure Laterality Date  . CHOLECYSTECTOMY      There were no vitals filed for this visit.  Subjective Assessment - 08/04/19 1156    Subjective   Pt. reports planning to have her hair, and nails done this weekend.    Patient is accompanied by:  Family member    Pertinent History  Pt. is a 73 y.o. female who sustained multiple CVAs with left hemiplegia, Pt. PMHx includes: Depression, Collagen Vascular Disease, Anxiety. Pt. recieved therapy services in thre past. Pt. resides at home with her husband who is supportive, and assists with her care. Pt. is alone during the day, as her husband works.      OT TREATMENT    Neuro muscular re-education:  Pt. worked on right hand [facility] skills, grasping 2" pegs from a container, and and placing them onto a pegboard following a design pattern. Pt. Worked was able to complete a simple design independently, however had difficulty completing a more complex design pattern.  Therapeutic Exercise:  Pt. Tolerated  PROM in all joint ranges of the LUE, and hand with emphasis placed on slow gentle prolonged stretching of the hand, wrist, and digits.   Response to Treatment:  Pt. Reports sleeping on her right UE wrong last night, and now can feel it. Pt. could benefit from a palm protector for the left hand. Pt. continues to present with increased flexor tone, and tightness in the LUE, and hand. Pt. tolerated ROM well without complaints of pain. Pt. requires continued focus on improving ROM, improving RUE strength, and coordination skills, assessing splinting needs, and conitnued education about LUE ROM to decrease risk of contractures, and promote good skin integrity through the palmar aspect of the left hand.                         OT Education - 08/04/19 1157    Education Details  HEP, strength, fine motor, ROM    [name]  Patient    Methods  Explanation;Demonstration;Verbal cues    Comprehension  Verbal cues required;Returned demonstration;Verbalized understanding          OT Long Term Goals - 07/14/19 0952      OT LONG TERM GOAL #1   Title  Pt. will improve RUE strength to be be able to reach into cabinetry to put items away without  compensating posteriorly with her trunk.    Baseline  Pt. continues to lean posteriorly when reaching with her RUE    Time  12    Period  Weeks    Status  On-going    Target Date  08/18/19      OT LONG TERM GOAL #2   Title  Pt. will independently complete light meal preparation in standing    Baseline  Pt. continues to have difficulty    Time  12    Period  Weeks    Status  On-going    Target Date  08/18/19      OT LONG TERM GOAL #3   Title  Pt. will improve right hand [facility] skills to be able to independently apply hook earrings.    Baseline  Pt. continues to be unable to place earrings.    Time  12    Period  Weeks    Status  On-going    Target Date  08/18/19      OT LONG TERM GOAL #4   Title  Pt. will improve right  grip strength to be able to hold a can at the can opener    Baseline  Pt. continues to be unable to hold, and use a can opener    Time  12    Period  Weeks    Status  New    Target Date  08/18/19      OT LONG TERM GOAL #5   Title  Pt. will require supervision to sweep the floor.    Baseline  Pt. is unable    Time  12    Period  Weeks    Status  On-going    Target Date  08/18/19            Plan - 08/04/19 1157    Clinical Impression Statement Pt. reports that she is going to finally take her trip to the [facility] this Thursday, so she will not be able to come to therapy.Pt. Reports sleeping on her right UE wrong last night, and now can feel it. Pt. could benefit from a palm protector for the left hand. Pt. continues to present with increased flexor tone, and tightness in the LUE, and hand. Pt. tolerated ROM well without complaints of pain. Pt. requires continued focus on improving ROM, improving RUE strength, and coordination skills, assessing splinting needs, and conitnued education about LUE ROM to decrease risk of contractures, and promote good skin integrity through the palmar aspect of the left hand.Pt. Reports sleeping on her right UE wrong last night, and now can feel it. Pt. could benefit from a palm protector for the left hand. Pt. continues to present with increased flexor tone, and tightness in the LUE, and hand. Pt. tolerated ROM well without complaints of pain. Pt. requires continued focus on improving ROM, improving RUE strength, and coordination skills, assessing splinting needs, and conitnued education about LUE ROM to decrease risk of contractures, and promote good skin integrity through the palmar aspect of the left hand.   OT Occupational Profile and History  Comprehensive Assessment- Review of records and extensive additional review of physical, cognitive, psychosocial history related to current functional performance    Occupational performance deficits (Please refer  to evaluation for details):  ADL's;IADL's    Body Structure / Function / Physical Skills  ADL;IADL;FMC;ROM;Pain;UE functional use;Strength;Dexterity    Rehab Potential  Good    Clinical Decision Making  Several treatment options, min-mod task modification necessary  Comorbidities Affecting Occupational Performance:  Presence of comorbidities impacting occupational performance    Comorbidities impacting occupational performance description:  histroy of strokes, obesity, decreased balance    Modification or Assistance to Complete Evaluation   Min-Moderate modification of tasks or assist with assess necessary to complete eval    OT Frequency  2x / week    OT Duration  12 weeks    OT Treatment/Interventions  Self-care/ADL training;Neuromuscular education;Manual Therapy;Therapeutic activities;Therapeutic exercise;Patient/family education    OT Home Exercise Plan  Self ROM exercises tabletop    Consulted and Agree with Plan of Care  Patient       Patient will benefit from skilled therapeutic intervention in order to improve the following deficits and impairments:   Body Structure / Function / Physical Skills: ADL, IADL, FMC, ROM, Pain, UE functional use, Strength, Dexterity       Visit Diagnosis: Muscle weakness (generalized)  Other lack of coordination    Problem List Patient Active Problem List   Diagnosis Date Noted  . Collagen vascular disease (HCC) 05/21/2017  . 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) 05/21/2017    Olegario Messier, MS, OTR/L 08/04/2019, 12:00 PM  [facility] MAIN [facility] SERVICES [address] [place], [place], [address] Phone: [phone]   Fax:  [phone]  Name: Ariel Alvarez MRN: [id number] Date of Birth: 08/20/1946

## 2019-08-04 NOTE — Therapy (Signed)
[place] MAIN [facility] SERVICES [address] [place], [place], [address] Phone: [phone]   Fax:  [phone]  Physical Therapy Treatment  Patient Details  Name: Ariel Alvarez MRN: [id number] Date of Birth: Nov 09, 1945 Referring Provider (PT): Okey Regal   Encounter Date: 08/04/2019  PT End of Session - 08/04/19 0840    Visit Number  25    Number of Visits  33    Date for PT Re-Evaluation  08/05/19    PT Start Time  0800    PT Stop Time  0844    PT Time Calculation (min)  44 min    Activity Tolerance  Patient limited by fatigue;Patient limited by pain    Behavior During Therapy  [facility] for tasks assessed/performed       Past Medical History:  Diagnosis Date  . Anxiety   . Collagen vascular disease ([place])   . Depression   . Stroke [facility])     Past Surgical History:  Procedure Laterality Date  . CHOLECYSTECTOMY      There were no vitals filed for this visit.  Subjective Assessment - 08/04/19 0827    Subjective  Patient reports that her feet are sore today.Her right knee cap was painful yesterday.    Patient is accompained by:  Family member    Pertinent History  Patient presents for instability s/p CVA in 01/2017. Patient was recently diagnosed with osteoporosis in bilateral legs and feet. Is potentially getting surgery in 2 weeks on both feet. After discharge from outpatient physical therapy had one session with home health PT but no other therapy. Reports no falls. Reports walking from living room to kitchen and back to walkway however her LLE gives out frequently . Per husband and patient she was going to an adult day center prior to [place]. Patient has a flat/level entry. Trying to get a power chair at this time from [employer]. Has a manual chair at this time, walker, hemi walker, quad cane. Doing bird baths but has a walk in tub she can't get in, does have a shower chair. Patient and husband aware that patient will  need caregiver with her due to toileting needs.    Limitations  Sitting;Lifting;Standing;Walking;House hold activities    How long can you sit comfortably?  recliner 5 hours, 30 minute sin wheelchair    How long can you stand comfortably?  4 minutes with UE support    How long can you walk comfortably?  15 ft    Patient Stated Goals  to walk better    Currently in Pain?  Yes    Pain Score  8     Pain Location  Knee    Pain Orientation  Right    Pain Descriptors / Indicators  Aching    Pain Type  Chronic pain    Pain Radiating Towards  none    Pain Onset  More than a month ago    Aggravating Factors   standing and walking    Pain Relieving Factors  stretching, heat    Effect of Pain on Daily Activities  limits activity           Treatment  Transfer <>multi level surfaces <>nu-step withCGAassist x4transfers, Patients performance improves with practice and cues to lean fwd improves quality of transfer  Transfers sit to standx 5with hemiwalker andCGAassist and Vc for safety and sequencing, patient improves with practice  PROM to left heel cord and left knee hamstring x 30  sec x 3,mod VCs for positioning to isolate calf strengthening  LAQ BLE x 10 x 2,Patient required min-moderate verbal/tactile cues for correct exercise technique.  Hip flex x 10 x 2,Patient required min-moderate verbal/tactile cues for correct exercise technique.  Gait training with hemi walker 32fetx4, wc following, CGA and VC for step length and posture,cues to avoid hip flexion but isolate knee extension for better stretch in hamstring;     Pt educated about posture and technique with exercises to improve exercise technique, movement at target joints, with min verbal, visual, tactile cues                       PT Education - 08/04/19 0829    Education Details  HEP    [name]  Patient    Methods  Explanation;Demonstration;Tactile cues     Comprehension  Returned demonstration;Verbal cues required;Need further instruction       PT Short Term Goals - 05/21/19 1446      PT SHORT TERM GOAL #1   Title  Patient will perform one sit to stand independently from wheelchair to increase independence with transfers    Baseline  6/17: min/mod A    Time  2    Period  Weeks    Status  Partially Met    Target Date  04/29/19      PT SHORT TERM GOAL #2   Title  Patient will be independent in home exercise program to improve strength/mobility for better functional independence with ADLs.    Baseline  HEP to be given next session    Time  2    Status  Partially Met    Target Date  04/29/19        PT Long Term Goals - 07/02/19 0954      PT LONG TERM GOAL #1   Title  Patient will perform 5 STS < 40 seconds independently with hemi walker to demonstrate improved home mobility.    Baseline  6/17: unable to perform one time without Min/Mod A, 05/21/19 1.06 sec: 07/02/19=56 sec    Time  8    Period  Weeks    Status  Partially Met    Target Date  08/05/19      PT LONG TERM GOAL #2   Title  Patient will stand without UE support for 3-5 minutes for ADL performance without LOB to increase independence with dressing and toileting.    Baseline  6/17: unable to stand without UE support, 05/21/19 1 min 30 sec:07/02/19= 3 min    Time  8    Period  Weeks    Status  New    Target Date  08/05/19      PT LONG TERM GOAL #3   Title  Patient will ambulate 60 ft with hemi walker and CGA to improve home mobility and independence with gait.     Baseline  6/17: 10 ft, 05/21/19 17 feet: 07/02/19 = 50 feet    Time  8    Period  Weeks    Status  Partially Met    Target Date  08/05/19      PT LONG TERM GOAL #4   Title  Patient will increase BLE gross strength to 4+/5 as to improve functional strength for independent gait, increased standing tolerance and increased ADL ability    Baseline  6/17: hips grossly 2+/5 knees 3+/5:     07/02/19: R hip  flex -3/5, L  hip flex 2/5  knees 3+/5    Time  8    Period  Weeks    Status  Partially Met    Target Date  08/05/19            Plan - 08/04/19 0840    Clinical Impression Statement  Patient has some right knee quad pain today that limits her ambulation due to increased pain during walking. She performs therapeutic exercise in open chain with 2 lbs without pain behaviors.She is instructed in mobiltiy and safety training with hemiwalker. She will continue to benefit from skilled PT to improve mobility and safety.    Personal Factors and Comorbidities  Behavior Pattern;Comorbidity 3+;Fitness;Past/Current Experience;Social Background;Time since onset of injury/illness/exacerbation;Transportation    Comorbidities  anxiety, collagen vascular disease, depression, stroke, amputated digit    Examination-Activity Limitations  Bathing;Bed Mobility;Bend;Caring for Others;Carry;Continence;Dressing;Sleep;Sit;Reach Overhead;Locomotion Level;Lift;Hygiene/Grooming;Squat;Stairs;Stand;Toileting;Transfers    Examination-Participation Restrictions  Church;Cleaning;Community Activity;Driving;Interpersonal Relationship;Meal Prep;Medication Management;Laundry;Shop;Volunteer;Yard Work    [employer]  Evolving/Moderate complexity    Rehab Potential  Fair    PT Frequency  2x / week    PT Treatment/Interventions  ADLs/Self Care Home Management;Aquatic Therapy;Cryotherapy;Biofeedback;Electrical Stimulation;Iontophoresis 58m/ml Dexamethasone;Moist Heat;Traction;Ultrasound;Functional mobility training;Stair training;Gait training;DME Instruction;Therapeutic activities;Therapeutic exercise;Balance training;Neuromuscular re-education;Orthotic Fit/Training;Patient/family education;Cognitive remediation;Wheelchair mobility training;Manual techniques;Taping;Splinting;Energy conservation    PT Next Visit Plan  give HEP, practice SPT, ambulation, COM    PT Home Exercise Plan  Reviewed LAQs and alt hip flexion with pt  today; pt able to verbalize and demonstrate understanding       Patient will benefit from skilled therapeutic intervention in order to improve the following deficits and impairments:  Abnormal gait, Decreased activity tolerance, Decreased coordination, Decreased balance, Decreased endurance, Decreased knowledge of precautions, Decreased knowledge of use of DME, Decreased safety awareness, Decreased range of motion, Decreased mobility, Decreased strength, Difficulty walking, Hypomobility, Increased fascial restricitons, Impaired flexibility, Impaired perceived functional ability, Impaired sensation, Impaired tone, Impaired UE functional use, Postural dysfunction, Improper body mechanics, Pain, Obesity  Visit Diagnosis: Muscle weakness (generalized)  Other lack of coordination  Other abnormalities of gait and mobility     Problem List Patient Active Problem List   Diagnosis Date Noted  . [place] 05/21/2017  . Gangrene of finger of right hand ([place] 05/21/2017    MAlanson Puls, PT DPT 08/04/2019, 9:34 AM  [place] [facility] [address] [place] [address]Phone: [phone]  Fax:  [phone] Name: SMIRENDA BALTAZARMRN: [id number] of Birth: [birth year]

## 2019-08-06 ENCOUNTER — Encounter: Payer: Medicare Other | Admitting: Occupational Therapy

## 2019-08-06 ENCOUNTER — Ambulatory Visit: Payer: Medicare Other | Admitting: Physical Therapy

## 2019-08-11 ENCOUNTER — Ambulatory Visit: Payer: Medicare Other | Admitting: Physical Therapy

## 2019-08-11 ENCOUNTER — Ambulatory Visit: Payer: Medicare Other | Admitting: Occupational Therapy

## 2019-08-13 ENCOUNTER — Ambulatory Visit: Payer: Medicare Other | Admitting: Physical Therapy

## 2019-08-13 ENCOUNTER — Ambulatory Visit: Payer: Medicare Other | Admitting: Occupational Therapy

## 2019-08-18 ENCOUNTER — Other Ambulatory Visit: Payer: Self-pay

## 2019-08-18 ENCOUNTER — Encounter: Payer: Self-pay | Admitting: Physical Therapy

## 2019-08-18 ENCOUNTER — Ambulatory Visit: Payer: Medicare Other | Admitting: Physical Therapy

## 2019-08-18 ENCOUNTER — Ambulatory Visit: Payer: Medicare Other | Admitting: Occupational Therapy

## 2019-08-18 DIAGNOSIS — M6281 Muscle weakness (generalized): Secondary | ICD-10-CM

## 2019-08-18 DIAGNOSIS — R278 Other lack of coordination: Secondary | ICD-10-CM

## 2019-08-18 DIAGNOSIS — R2689 Other abnormalities of gait and mobility: Secondary | ICD-10-CM

## 2019-08-18 NOTE — Therapy (Signed)
[place] MAIN [facility] SERVICES [address] [place], [place], [address] Phone: [phone]   Fax:  [phone]  Physical Therapy Treatment  Patient Details  Name: Ariel Alvarez MRN: [id number] Date of Birth: 1945-12-17 Referring Provider (PT): Okey Regal   Encounter Date: 08/18/2019  PT End of Session - 08/18/19 0842    Visit Number  26    Number of Visits  33    Date for PT Re-Evaluation  08/05/19    PT Start Time  0800    PT Stop Time  0825    PT Time Calculation (min)  25 min    Activity Tolerance  Patient limited by fatigue;Patient limited by pain    Behavior During Therapy  [facility] for tasks assessed/performed       Past Medical History:  Diagnosis Date  . Anxiety   . Collagen vascular disease ([place])   . Depression   . Stroke [facility])     Past Surgical History:  Procedure Laterality Date  . CHOLECYSTECTOMY      There were no vitals filed for this visit.  Subjective Assessment - 08/18/19 0839    Subjective  Patient reports that she is getting over being sick and is not at 100% yet.    Patient is accompained by:  Family member    Pertinent History  Patient presents for instability s/p CVA in 01/2017. Patient was recently diagnosed with osteoporosis in bilateral legs and feet. Is potentially getting surgery in 2 weeks on both feet. After discharge from outpatient physical therapy had one session with home health PT but no other therapy. Reports no falls. Reports walking from living room to kitchen and back to walkway however her LLE gives out frequently . Per husband and patient she was going to an adult day center prior to [facility]. Patient has a flat/level entry. Trying to get a power chair at this time from [employer]. Has a manual chair at this time, walker, hemi walker, quad cane. Doing bird baths but has a walk in tub she can't get in, does have a shower chair. Patient and husband aware that patient will need  caregiver with her due to toileting needs.    Limitations  Sitting;Lifting;Standing;Walking;House hold activities    How long can you sit comfortably?  recliner 5 hours, 30 minute sin wheelchair    How long can you stand comfortably?  4 minutes with UE support    How long can you walk comfortably?  15 ft    Patient Stated Goals  to walk better    Pain Onset  More than a month ago       Treatment: Transfer training sit to stand with CGA and several attempts with RW x 5  Gait training with hemi walker 25 feet x 1, CGA   Patient is feeling unwell and treatment is stopped due to patient not feeling well.                        PT Education - 08/18/19 0839    Education Details  HEP    [name]  Patient    Methods  Explanation;Demonstration;Verbal cues    Comprehension  Verbalized understanding;Returned demonstration       PT Short Term Goals - 05/21/19 1446      PT SHORT TERM GOAL #1   Title  Patient will perform one sit to stand independently from wheelchair to increase independence with transfers  Baseline  6/17: min/mod A    Time  2    Period  Weeks    Status  Partially Met    Target Date  04/29/19      PT SHORT TERM GOAL #2   Title  Patient will be independent in home exercise program to improve strength/mobility for better functional independence with ADLs.    Baseline  HEP to be given next session    Time  2    Status  Partially Met    Target Date  04/29/19        PT Long Term Goals - 07/02/19 0954      PT LONG TERM GOAL #1   Title  Patient will perform 5 STS < 40 seconds independently with hemi walker to demonstrate improved home mobility.    Baseline  6/17: unable to perform one time without Min/Mod A, 05/21/19 1.06 sec: 07/02/19=56 sec    Time  8    Period  Weeks    Status  Partially Met    Target Date  08/05/19      PT LONG TERM GOAL #2   Title  Patient will stand without UE support for 3-5 minutes for ADL performance without  LOB to increase independence with dressing and toileting.    Baseline  6/17: unable to stand without UE support, 05/21/19 1 min 30 sec:07/02/19= 3 min    Time  8    Period  Weeks    Status  New    Target Date  08/05/19      PT LONG TERM GOAL #3   Title  Patient will ambulate 60 ft with hemi walker and CGA to improve home mobility and independence with gait.     Baseline  6/17: 10 ft, 05/21/19 17 feet: 07/02/19 = 50 feet    Time  8    Period  Weeks    Status  Partially Met    Target Date  08/05/19      PT LONG TERM GOAL #4   Title  Patient will increase BLE gross strength to 4+/5 as to improve functional strength for independent gait, increased standing tolerance and increased ADL ability    Baseline  6/17: hips grossly 2+/5 knees 3+/5:     07/02/19: R hip  flex -3/5, L hip flex 2/5 knees 3+/5    Time  8    Period  Weeks    Status  Partially Met    Target Date  08/05/19            Plan - 08/18/19 0842    Clinical Impression Statement  Patient performs mobiity transfers for sit to stand with hemiwalker. She needs instruction for safety and sequencing. She performs gait for 25 feet and becomes feeling unwell and nauseaed. Treatment stopped and will resume when she is feeling better. Patient will continue to benefit from skilled PTot improve mobility.    Personal Factors and Comorbidities  Behavior Pattern;Comorbidity 3+;Fitness;Past/Current Experience;Social Background;Time since onset of injury/illness/exacerbation;Transportation    Comorbidities  anxiety, collagen vascular disease, depression, stroke, amputated digit    Examination-Activity Limitations  Bathing;Bed Mobility;Bend;Caring for Others;Carry;Continence;Dressing;Sleep;Sit;Reach Overhead;Locomotion Level;Lift;Hygiene/Grooming;Squat;Stairs;Stand;Toileting;Transfers    Examination-Participation Restrictions  Church;Cleaning;Community Activity;Driving;Interpersonal Relationship;Meal Prep;Medication  Management;Laundry;Shop;Volunteer;Yard Work    [employer]  Evolving/Moderate complexity    Rehab Potential  Fair    PT Frequency  2x / week    PT Treatment/Interventions  ADLs/Self Care Home Management;Aquatic Therapy;Cryotherapy;Biofeedback;Electrical Stimulation;Iontophoresis 74m/ml Dexamethasone;Moist Heat;Traction;Ultrasound;Functional mobility training;Stair training;Gait training;DME Instruction;Therapeutic activities;Therapeutic exercise;Balance  training;Neuromuscular re-education;Orthotic Fit/Training;Patient/family education;Cognitive remediation;Wheelchair mobility training;Manual techniques;Taping;Splinting;Energy conservation    PT Next Visit Plan  give HEP, practice SPT, ambulation, COM    PT Home Exercise Plan  Reviewed LAQs and alt hip flexion with pt today; pt able to verbalize and demonstrate understanding       Patient will benefit from skilled therapeutic intervention in order to improve the following deficits and impairments:  Abnormal gait, Decreased activity tolerance, Decreased coordination, Decreased balance, Decreased endurance, Decreased knowledge of precautions, Decreased knowledge of use of DME, Decreased safety awareness, Decreased range of motion, Decreased mobility, Decreased strength, Difficulty walking, Hypomobility, Increased fascial restricitons, Impaired flexibility, Impaired perceived functional ability, Impaired sensation, Impaired tone, Impaired UE functional use, Postural dysfunction, Improper body mechanics, Pain, Obesity  Visit Diagnosis: Muscle weakness (generalized)  Other lack of coordination  Other abnormalities of gait and mobility     Problem List Patient Active Problem List   Diagnosis Date Noted  . Collagen vascular disease ([place]) 05/21/2017  . Gangrene of finger of right hand ([place]) 05/21/2017    Alanson Puls, PT DPT 08/18/2019, 11:46 AM  [place] MAIN [facility]  SERVICES [address] [place], [place], [address] Phone: [phone]   Fax:  [phone]  Name: Ariel Alvarez MRN: [id number] Date of Birth: May 26, 1946

## 2019-08-20 ENCOUNTER — Ambulatory Visit: Payer: Medicare Other | Admitting: Physical Therapy

## 2019-08-20 ENCOUNTER — Ambulatory Visit: Payer: Medicare Other | Admitting: Occupational Therapy

## 2019-08-25 ENCOUNTER — Ambulatory Visit: Payer: Medicare Other | Admitting: Occupational Therapy

## 2019-08-25 ENCOUNTER — Encounter: Payer: Self-pay | Admitting: Occupational Therapy

## 2019-08-25 ENCOUNTER — Other Ambulatory Visit: Payer: Self-pay

## 2019-08-25 ENCOUNTER — Ambulatory Visit: Payer: Medicare Other | Admitting: Physical Therapy

## 2019-08-25 ENCOUNTER — Encounter: Payer: Self-pay | Admitting: Physical Therapy

## 2019-08-25 DIAGNOSIS — M6281 Muscle weakness (generalized): Secondary | ICD-10-CM

## 2019-08-25 DIAGNOSIS — R278 Other lack of coordination: Secondary | ICD-10-CM

## 2019-08-25 DIAGNOSIS — R2689 Other abnormalities of gait and mobility: Secondary | ICD-10-CM

## 2019-08-25 NOTE — Therapy (Addendum)
[place] MAIN [facility] SERVICES [address] [place], [place], [address] Phone: [phone]   Fax:  [phone]  OT Treatment/Recertification Note  Patient Details  Name: Ariel Alvarez MRN: [id number] Date of Birth: [birth year] No data recorded  Encounter Date: 08/25/2019  OT End of Session - 08/25/19 0949    Visit Number  15    Number of Visits  24    Date for OT Re-Evaluation  11/17/2019   Authorization Type  Progress report period starting 917/2020    OT Start Time  0845    OT Stop Time  0930    OT Time Calculation (min)  45 min    Equipment Utilized During Treatment  LUE ROM, self-ROM, RUE grip strength, and coordination    Activity Tolerance  Patient tolerated treatment well    Behavior During Therapy  WFL for tasks assessed/performed       Past Medical History:  Diagnosis Date  . Anxiety   . Collagen vascular disease ([place])   . Depression   . Stroke [facility])     Past Surgical History:  Procedure Laterality Date  . CHOLECYSTECTOMY      There were no vitals filed for this visit.  Subjective Assessment - 08/25/19 0946    Subjective   Pt. reports having eye allergies.    Patient is accompanied by:  Family member    Pertinent History  Pt. is a 73 y.o. female who sustained multiple CVAs with left hemiplegia, Pt. PMHx includes: Depression, Collagen Vascular Disease, Anxiety. Pt. recieved therapy services in thre past. Pt. resides at home with her husband who is supportive, and assists with her care. Pt. is alone during the day, as her husband works.    Currently in Pain?  No/denies       OT TREATMENT  Therapeutic Exercise:  Pt. Tolerated PROM in all joint ranges of the LUE, and hand with emphasis placed on slow gentle prolonged stretching of the hand, wrist, and digits.  Response to Treatment   Pt. has returned to therapy after being out for vacation, followed by a stomach illness. Pt. reports that she felt her LUE curl her  hair over the weekend, however when she looked at it, she noticed it was tight in flexion. Pt.s SO2 92%.  Pt. continues to present with increased tone and tightness in her LUE, and keeps her hand positioned in tight flexion. Pt. continues to work on normalizing tone, and preparing the LUE for ROM.                    OT Education - 08/25/19 0949    Education Details  ROM    [name]  Patient    Methods  Explanation;Demonstration;Verbal cues    Comprehension  Verbal cues required;Returned demonstration;Verbalized understanding          OT Long Term Goals      OT LONG TERM GOAL #1   Title  Pt. will improve RUE strength to be be able to reach into cabinetry to put items away without compensating posteriorly with her trunk.    Baseline  Pt. continues to lean posteriorly when reaching with her RUE    Time  12    Period  Weeks    Status  On-going    Target Date  11/17/19      OT LONG TERM GOAL #2   Title  Pt. will independently complete light meal preparation in standing    Baseline  Pt. continues to have difficulty    Time  12    Period  Weeks    Status  On-going    Target Date  11/17/2019     OT LONG TERM GOAL #3   Title  Pt. will improve right hand [facility] skills to be able to independently apply hook earrings.    Baseline  Pt. continues to be unable to place earrings.    Time  12    Period  Weeks    Status  On-going    Target Date  11/17/2019     OT LONG TERM GOAL #4   Title  Pt. will improve right grip strength to be able to hold a can at the can opener    Baseline  Pt. continues to be unable to hold, and use a can opener    Time  12    Period  Weeks    Status  New    Target Date  11/17/2019     OT LONG TERM GOAL #5   Title  Pt. will require supervision to sweep the floor.    Baseline  Pt. is unable    Time  12    Period  Weeks    Status  On-going    Target Date  11/17/2019           Plan - 08/25/19 0950    Clinical Impression Statement   Pt. has returned to therapy after being out for vacation, followed by a stomach illness. Pt. reports that she felt her LUE curl her hair over the weekend, however when she looked at it, she noticed it was tight in flexion. Pt.s SO2 92%.  Pt. continues to present with increased tone and tightness in her LUE, and keeps her hand positioned in tight flexion. Pt. continues to work on normalizing tone, and preparing the LUE for ROM.    OT Occupational Profile and History  Comprehensive Assessment- Review of records and extensive additional review of physical, cognitive, psychosocial history related to current functional performance    Occupational performance deficits (Please refer to evaluation for details):  ADL's;IADL's    Body Structure / Function / Physical Skills  ADL;IADL;FMC;ROM;Pain;UE functional use;Strength;Dexterity    Clinical Decision Making  Several treatment options, min-mod task modification necessary    Comorbidities Affecting Occupational Performance:  Presence of comorbidities impacting occupational performance    Modification or Assistance to Complete Evaluation   Min-Moderate modification of tasks or assist with assess necessary to complete eval    OT Frequency  2x / week    OT Duration  12 weeks    OT Treatment/Interventions  Self-care/ADL training;Neuromuscular education;Manual Therapy;Therapeutic activities;Therapeutic exercise;Patient/family education    Consulted and Agree with Plan of Care  Patient       Patient will benefit from skilled therapeutic intervention in order to improve the following deficits and impairments:   Body Structure / Function / Physical Skills: ADL, IADL, FMC, ROM, Pain, UE functional use, Strength, Dexterity       Visit Diagnosis: Muscle weakness (generalized)    Problem List Patient Active Problem List   Diagnosis Date Noted  . Collagen vascular disease ([facility]) 05/21/2017  . 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) 05/21/2017    Olegario Messier, MS, OTR/L 08/25/2019, 10:01 AM  [place] [facility] MAIN [facility] SERVICES [address] [place], [place], [address] Phone: [phone]   Fax:  [phone]  Name: Ariel Alvarez MRN: [id number] Date of Birth: 1945-12-21

## 2019-08-25 NOTE — Therapy (Signed)
[place] MAIN [facility] SERVICES [address] [place], [place], [address] Phone: [phone]   Fax:  [phone]  Physical Therapy Treatment  Patient Details  Name: Ariel Alvarez MRN: [id number] Date of Birth: 02-07-1946 Referring Provider (PT): Okey Regal   Encounter Date: 08/25/2019  PT End of Session - 08/25/19 0758    Visit Number  27    Number of Visits  33    Date for PT Re-Evaluation  08/05/19    PT Start Time  0800    PT Stop Time  0840    PT Time Calculation (min)  40 min    Activity Tolerance  Patient limited by fatigue;Patient limited by pain    Behavior During Therapy  [facility] for tasks assessed/performed       Past Medical History:  Diagnosis Date  . Anxiety   . Collagen vascular disease ([place])   . Depression   . Stroke [facility])     Past Surgical History:  Procedure Laterality Date  . CHOLECYSTECTOMY      There were no vitals filed for this visit.  Subjective Assessment - 08/25/19 0757    Subjective  Patient reports that she is getting over being sick and is not at 100% yet.    Patient is accompained by:  Family member    Pertinent History  Patient presents for instability s/p CVA in 01/2017. Patient was recently diagnosed with osteoporosis in bilateral legs and feet. Is potentially getting surgery in 2 weeks on both feet. After discharge from outpatient physical therapy had one session with home health PT but no other therapy. Reports no falls. Reports walking from living room to kitchen and back to walkway however her LLE gives out frequently . Per husband and patient she was going to an adult day center prior to [employer]. Patient has a flat/level entry. Trying to get a power chair at this time from [employer]. Has a manual chair at this time, walker, hemi walker, quad cane. Doing bird baths but has a walk in tub she can't get in, does have a shower chair. Patient and husband aware that patient will need  caregiver with her due to toileting needs.    Limitations  Sitting;Lifting;Standing;Walking;House hold activities    How long can you sit comfortably?  recliner 5 hours, 30 minute sin wheelchair    How long can you stand comfortably?  4 minutes with UE support    How long can you walk comfortably?  15 ft    Patient Stated Goals  to walk better    Pain Onset  More than a month ago       Treatment Transfer training with hemi walker sit to stand with VC for posture and safety  Therapeutic exercise: Nu-step x 5 mins L 4  Supine: AAROM;  SLR x 15 BLE Hookling marching x 15  Hooklying abd/ER x 15  Bridging x 15 PROM; L Ankle heel cord stretch  Gait training with hemi walker 40 feet x 3 with cGA and WC following and cues for posture and sequencing  Patient performed with instruction, verbal cues, tactile cues of therapist: goal: increase tissue extensibility, promote proper posture, improve mobility                         PT Education - 08/25/19 0757    Education Details  hep    [name]  Patient    Methods  Explanation;Demonstration;Tactile cues;Verbal  cues    Comprehension  Verbalized understanding;Returned demonstration       PT Short Term Goals - 05/21/19 1446      PT SHORT TERM GOAL #1   Title  Patient will perform one sit to stand independently from wheelchair to increase independence with transfers    Baseline  6/17: min/mod A    Time  2    Period  Weeks    Status  Partially Met    Target Date  04/29/19      PT SHORT TERM GOAL #2   Title  Patient will be independent in home exercise program to improve strength/mobility for better functional independence with ADLs.    Baseline  HEP to be given next session    Time  2    Status  Partially Met    Target Date  04/29/19        PT Long Term Goals - 07/02/19 0954      PT LONG TERM GOAL #1   Title  Patient will perform 5 STS < 40 seconds independently with hemi walker to demonstrate  improved home mobility.    Baseline  6/17: unable to perform one time without Min/Mod A, 05/21/19 1.06 sec: 07/02/19=56 sec    Time  8    Period  Weeks    Status  Partially Met    Target Date  08/05/19      PT LONG TERM GOAL #2   Title  Patient will stand without UE support for 3-5 minutes for ADL performance without LOB to increase independence with dressing and toileting.    Baseline  6/17: unable to stand without UE support, 05/21/19 1 min 30 sec:07/02/19= 3 min    Time  8    Period  Weeks    Status  New    Target Date  08/05/19      PT LONG TERM GOAL #3   Title  Patient will ambulate 60 ft with hemi walker and CGA to improve home mobility and independence with gait.     Baseline  6/17: 10 ft, 05/21/19 17 feet: 07/02/19 = 50 feet    Time  8    Period  Weeks    Status  Partially Met    Target Date  08/05/19      PT LONG TERM GOAL #4   Title  Patient will increase BLE gross strength to 4+/5 as to improve functional strength for independent gait, increased standing tolerance and increased ADL ability    Baseline  6/17: hips grossly 2+/5 knees 3+/5:     07/02/19: R hip  flex -3/5, L hip flex 2/5 knees 3+/5    Time  8    Period  Weeks    Status  Partially Met    Target Date  08/05/19            Plan - 08/25/19 0758    Clinical Impression Statement Patient   instructed in transfers with rest periods standing. Patient performs intermediate LE exercises in supine and sitting open and closed chain. Patient will continue to benefit from skilled PT to improve strength, balance and gait. Patient continues to have LE weakness and decreased static and dynamic standing balance.   Personal Factors and Comorbidities  Behavior Pattern;Comorbidity 3+;Fitness;Past/Current Experience;Social Background;Time since onset of injury/illness/exacerbation;Transportation    Comorbidities  anxiety, collagen vascular disease, depression, stroke, amputated digit    Examination-Activity Limitations  Bathing;Bed  Mobility;Bend;Caring for Others;Carry;Continence;Dressing;Sleep;Sit;Reach Overhead;Locomotion Level;Lift;Hygiene/Grooming;Squat;Stairs;Stand;Toileting;Transfers    Examination-Participation Restrictions  Church;Cleaning;Community Activity;Driving;Interpersonal Relationship;Meal Prep;Medication Management;Laundry;Shop;Volunteer;Yard Work    [employer]chant [employer] officer  Evolving/Moderate complexity    Rehab Potential  Fair    PT Frequency  2x / week    PT Treatment/Interventions  ADLs/Self Care Home Management;Aquatic Therapy;Cryotherapy;Biofeedback;Electrical Stimulation;Iontophoresis 8m/ml Dexamethasone;Moist Heat;Traction;Ultrasound;Functional mobility training;Stair training;Gait training;DME Instruction;Therapeutic activities;Therapeutic exercise;Balance training;Neuromuscular re-education;Orthotic Fit/Training;Patient/family education;Cognitive remediation;Wheelchair mobility training;Manual techniques;Taping;Splinting;Energy conservation    PT Next Visit Plan  give HEP, practice SPT, ambulation, COM    PT Home Exercise Plan  Reviewed LAQs and alt hip flexion with pt today; pt able to verbalize and demonstrate understanding       Patient will benefit from skilled therapeutic intervention in order to improve the following deficits and impairments:  Abnormal gait, Decreased activity tolerance, Decreased coordination, Decreased balance, Decreased endurance, Decreased knowledge of precautions, Decreased knowledge of use of DME, Decreased safety awareness, Decreased range of motion, Decreased mobility, Decreased strength, Difficulty walking, Hypomobility, Increased fascial restricitons, Impaired flexibility, Impaired perceived functional ability, Impaired sensation, Impaired tone, Impaired UE functional use, Postural dysfunction, Improper body mechanics, Pain, Obesity  Visit Diagnosis: Muscle weakness (generalized)  Other lack of coordination  Other abnormalities of gait and  mobility     Problem List Patient Active Problem List   Diagnosis Date Noted  . [place] 05/21[place]2017  . Gang[place] of finger of right hand ([place] 05/21/2017    MAlanson Puls PT DPT 08/25/2019, 7:59 AM  [place] [facility] [address] [address]Phone: [phone]  Fax:  [phone] Name: SSTUTI SANDINMRN: [id number] of Birth: 1April 29, 1947

## 2019-08-27 ENCOUNTER — Ambulatory Visit: Payer: Medicare Other | Admitting: Occupational Therapy

## 2019-08-27 ENCOUNTER — Ambulatory Visit: Payer: Medicare Other | Admitting: Physical Therapy

## 2019-09-01 ENCOUNTER — Other Ambulatory Visit: Payer: Self-pay

## 2019-09-01 ENCOUNTER — Encounter: Payer: Self-pay | Admitting: Physical Therapy

## 2019-09-01 ENCOUNTER — Ambulatory Visit: Payer: Medicare Other | Admitting: Occupational Therapy

## 2019-09-01 ENCOUNTER — Ambulatory Visit: Payer: Medicare Other | Attending: Internal Medicine | Admitting: Physical Therapy

## 2019-09-01 ENCOUNTER — Encounter: Payer: Self-pay | Admitting: Occupational Therapy

## 2019-09-01 DIAGNOSIS — R2689 Other abnormalities of gait and mobility: Secondary | ICD-10-CM | POA: Insufficient documentation

## 2019-09-01 DIAGNOSIS — R278 Other lack of coordination: Secondary | ICD-10-CM | POA: Insufficient documentation

## 2019-09-01 DIAGNOSIS — R29810 Facial weakness: Secondary | ICD-10-CM | POA: Diagnosis present

## 2019-09-01 DIAGNOSIS — M6281 Muscle weakness (generalized): Secondary | ICD-10-CM | POA: Diagnosis not present

## 2019-09-01 NOTE — Therapy (Signed)
[place] MAIN [facility] SERVICES [address] [place], [place], [address] Phone: [phone]   Fax:  [phone]  Occupational Therapy Treatment  Patient Details  Name: Ariel Alvarez MRN: [id number] Date of Birth: Mar 20, 1946 No data recorded  Encounter Date: 09/01/2019  OT End of Session - 09/01/19 1133    Visit Number  16    Number of Visits  24    Date for OT Re-Evaluation  11/17/19    Authorization Type  Progress report period starting 917/2020    OT Start Time  0845    OT Stop Time  0930    OT Time Calculation (min)  45 min    Equipment Utilized During Treatment  LUE ROM, self-ROM, RUE grip strength, and coordination    Activity Tolerance  Patient tolerated treatment well    Behavior During Therapy  WFL for tasks assessed/performed       Past Medical History:  Diagnosis Date  . Anxiety   . Collagen vascular disease ([place])   . Depression   . Stroke [facility])     Past Surgical History:  Procedure Laterality Date  . CHOLECYSTECTOMY      There were no vitals filed for this visit.  Subjective Assessment - 09/01/19 1132    Subjective   Pt. reports feeling much better than last week.    Patient is accompanied by:  Family member    Pertinent History  Pt. is a 73 y.o. female who sustained multiple CVAs with left hemiplegia, Pt. PMHx includes: Depression, Collagen Vascular Disease, Anxiety. Pt. recieved therapy services in thre past. Pt. resides at home with her husband who is supportive, and assists with her care. Pt. is alone during the day, as her husband works.    Currently in Pain?  No/denies      OT TREATMENT    Neuro muscular re-education:  Pt. worked on Right hand [facility] skills grasping 1" sticks, 1/4" collars, and 1/4" washers. Pt. was able to grasp the items requiring one rest break.  Therapeutic Exercise:  Pt. performed RUE gross gripping with grip strengthener. Pt. worked on sustaining grip while grasping pegs and reaching  at various heights. The gripper was set at 11.2# of grip strength force. Pt. Tolerated PROM in all joint ranges of the LUE, and handwith emphasis placed on slow gentle prolonged stretching of the hand, wrist, and digits. Pt. Education was provided about self-ROM  Response to Treatment:  Pt. has returned to therapy, and reports feeling better following stomach issues last week. Pt. continues to present with increased flexor tone, and tightness in her left UE,and hand. Pt. is tolerating ROM, and requires cues for self-ROM using her right hand to range her left hand. Pt. continues to work on improving, and increasing engagement of the LUE during ADLs, and IADL tasks.                    OT Education - 09/01/19 1132    Education Details  ROM    [name]  Patient    Methods  Explanation;Demonstration;Verbal cues    Comprehension  Verbal cues required;Returned demonstration;Verbalized understanding          Plan - 09/01/19 1133    Clinical Impression Statement  Pt. has returned to therapy, and reports feeling better following stomach issues last week. Pt. continues to present with increased flexor tone, and tightness in her left UE,and hand. Pt. is tolerating ROM, and requires cues for self-ROM using her  right hand to range her left hand. Pt. continues to work on improving, and increasing engagement of the LUE during ADLs, and IADL tasks.    OT Occupational Profile and History  Comprehensive Assessment- Review of records and extensive additional review of physical, cognitive, psychosocial history related to current functional performance    Occupational performance deficits (Please refer to evaluation for details):  ADL's;IADL's    Body Structure / Function / Physical Skills  ADL;IADL;FMC;ROM;Pain;UE functional use;Strength;Dexterity    Rehab Potential  Good    Clinical Decision Making  Several treatment options, min-mod task modification necessary    Comorbidities  Affecting Occupational Performance:  Presence of comorbidities impacting occupational performance    Comorbidities impacting occupational performance description:  histroy of strokes, obesity, decreased balance    Modification or Assistance to Complete Evaluation   Min-Moderate modification of tasks or assist with assess necessary to complete eval    OT Frequency  2x / week    OT Duration  12 weeks    OT Treatment/Interventions  Self-care/ADL training;Neuromuscular education;Manual Therapy;Therapeutic activities;Therapeutic exercise;Patient/family education    OT Home Exercise Plan  Self ROM exercises tabletop    Consulted and Agree with Plan of Care  Patient       Patient will benefit from skilled therapeutic intervention in order to improve the following deficits and impairments:   Body Structure / Function / Physical Skills: ADL, IADL, FMC, ROM, Pain, UE functional use, Strength, Dexterity       Visit Diagnosis: Muscle weakness (generalized)  Other lack of coordination    Problem List Patient Active Problem List   Diagnosis Date Noted  . Collagen vascular disease (HCC) 05/21/2017  . 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 [place], [place], [address] Phone: [phone]   Fax:  [phone]  Name: Ariel Alvarez MRN: [id number] Date of Birth: [birth year]

## 2019-09-01 NOTE — Addendum Note (Signed)
Addended by: Lucia Bitter on: 09/01/2019 11:29 AM   Modules accepted: Orders

## 2019-09-01 NOTE — Therapy (Signed)
[place] MAIN [facility] SERVICES [address] [place], [place], [address] Phone: [phone]   Fax:  [phone]  Physical Therapy Treatment  Patient Details  Name: Ariel Alvarez MRN: [id number] Date of Birth: 12/07/1945 Referring Provider (PT): Okey Regal   Encounter Date: 09/01/2019  PT End of Session - 09/01/19 0905    Visit Number  28    Number of Visits  33    Date for PT Re-Evaluation  08/05/19    PT Start Time  0805    PT Stop Time  0845    PT Time Calculation (min)  40 min    Activity Tolerance  Patient limited by fatigue;Patient limited by pain    Behavior During Therapy  [facility] for tasks assessed/performed       Past Medical History:  Diagnosis Date  . Anxiety   . Collagen vascular disease ([place])   . Depression   . Stroke [facility])     Past Surgical History:  Procedure Laterality Date  . CHOLECYSTECTOMY      There were no vitals filed for this visit.  Subjective Assessment - 09/01/19 0904    Subjective  Patient reports that she is better. no new concerns.    Patient is accompained by:  Family member    Pertinent History  Patient presents for instability s/p CVA in 01/2017. Patient was recently diagnosed with osteoporosis in bilateral legs and feet. Is potentially getting surgery in 2 weeks on both feet. After discharge from outpatient physical therapy had one session with home health PT but no other therapy. Reports no falls. Reports walking from living room to kitchen and back to walkway however her LLE gives out frequently . Per husband and patient she was going to an adult day center prior to [place]. Patient has a flat/level entry. Trying to get a power chair at this time from [employer]. Has a manual chair at this time, walker, hemi walker, quad cane. Doing bird baths but has a walk in tub she can't get in, does have a shower chair. Patient and husband aware that patient will need caregiver with her due to  toileting needs.    Limitations  Sitting;Lifting;Standing;Walking;House hold activities    How long can you sit comfortably?  recliner 5 hours, 30 minute sin wheelchair    How long can you stand comfortably?  4 minutes with UE support    How long can you walk comfortably?  15 ft    Patient Stated Goals  to walk better    Pain Onset  More than a month ago          Treatment  PROM to left ankle for calf stretch x 30 sec x 5 , no pain reported Transfer <>multi level surfaces <>nu-step withCGAassist x4transfers, Patients performance improves with practice and cues to lean fwd improves quality of transfer              Transfers sit to standx 5with hemiwalker andCGAassist and Vc for safety and sequencing, patient improves with practice              Gait training with hemiwalker 40 feet x 4 with wc following and cGA and VC for sequencing and safety             Gait training with hemiwalker and side stepping x 5 feet left and right, backwards stepping with cGA and VC for sequencing. Posture and safety  side stepping in parallel bars x 2 laps with vC to keep feet in correct positions.   Pt educated throughout session about proper posture and technique with exercises. Improved exercise technique, movement at target joints, use of target muscles after min to mod verbal, visual, tactile cues.                     PT Education - 09/01/19 0905    Education Details  hep    [name]  Patient    Methods  Explanation    Comprehension  Verbalized understanding       PT Short Term Goals - 05/21/19 1446      PT SHORT TERM GOAL #1   Title  Patient will perform one sit to stand independently from wheelchair to increase independence with transfers    Baseline  6/17: min/mod A    Time  2    Period  Weeks    Status  Partially Met    Target Date  04/29/19      PT SHORT TERM GOAL #2   Title  Patient will be independent in home exercise program to  improve strength/mobility for better functional independence with ADLs.    Baseline  HEP to be given next session    Time  2    Status  Partially Met    Target Date  04/29/19        PT Long Term Goals - 07/02/19 0954      PT LONG TERM GOAL #1   Title  Patient will perform 5 STS < 40 seconds independently with hemi walker to demonstrate improved home mobility.    Baseline  6/17: unable to perform one time without Min/Mod A, 05/21/19 1.06 sec: 07/02/19=56 sec    Time  8    Period  Weeks    Status  Partially Met    Target Date  08/05/19      PT LONG TERM GOAL #2   Title  Patient will stand without UE support for 3-5 minutes for ADL performance without LOB to increase independence with dressing and toileting.    Baseline  6/17: unable to stand without UE support, 05/21/19 1 min 30 sec:07/02/19= 3 min    Time  8    Period  Weeks    Status  New    Target Date  08/05/19      PT LONG TERM GOAL #3   Title  Patient will ambulate 60 ft with hemi walker and CGA to improve home mobility and independence with gait.     Baseline  6/17: 10 ft, 05/21/19 17 feet: 07/02/19 = 50 feet    Time  8    Period  Weeks    Status  Partially Met    Target Date  08/05/19      PT LONG TERM GOAL #4   Title  Patient will increase BLE gross strength to 4+/5 as to improve functional strength for independent gait, increased standing tolerance and increased ADL ability    Baseline  6/17: hips grossly 2+/5 knees 3+/5:     07/02/19: R hip  flex -3/5, L hip flex 2/5 knees 3+/5    Time  8    Period  Weeks    Status  Partially Met    Target Date  08/05/19            Plan - 09/01/19 3419    Clinical Impression Statement Patient   instructed in transfers with  rest periods standing. Patient performs intermediate LE exercises in  sitting open and closed chain. Patient will continue to benefit from skilled PT to improve strength, balance and gait. Patient continues to have LE weakness and decreased static and dynamic  standing balance.   Personal Factors and Comorbidities  Behavior Pattern;Comorbidity 3+;Fitness;Past/Current Experience;Social Background;Time since onset of injury/illness/exacerbation;Transportation    Comorbidities  anxiety, collagen vascular disease, depression, stroke, amputated digit    Examination-Activity Limitations  Bathing;Bed Mobility;Bend;Caring for Others;Carry;Continence;Dressing;Sleep;Sit;Reach Overhead;Locomotion Level;Lift;Hygiene/Grooming;Squat;Stairs;Stand;Toileting;Transfers    Examination-Participation Restrictions  Church;Cleaning;Community Activity;Driving;Interpersonal Relationship;Meal Prep;Medication Management;Laundry;Shop;Volunteer;Yard Work    [employer]  Evolving/Moderate complexity    Rehab Potential  Fair    PT Frequency  2x / week    PT Treatment/Interventions  ADLs/Self Care Home Management;Aquatic Therapy;Cryotherapy;Biofeedback;Electrical Stimulation;Iontophoresis 70m/ml Dexamethasone;Moist Heat;Traction;Ultrasound;Functional mobility training;Stair training;Gait training;DME Instruction;Therapeutic activities;Therapeutic exercise;Balance training;Neuromuscular re-education;Orthotic Fit/Training;Patient/family education;Cognitive remediation;Wheelchair mobility training;Manual techniques;Taping;Splinting;Energy conservation    PT Next Visit Plan  give HEP, practice SPT, ambulation, COM    PT Home Exercise Plan  Reviewed LAQs and alt hip flexion with pt today; pt able to verbalize and demonstrate understanding       Patient will benefit from skilled therapeutic intervention in order to improve the following deficits and impairments:  Abnormal gait, Decreased activity tolerance, Decreased coordination, Decreased balance, Decreased endurance, Decreased knowledge of precautions, Decreased knowledge of use of DME, Decreased safety awareness, Decreased range of motion, Decreased mobility, Decreased strength, Difficulty walking, Hypomobility,  Increased fascial restricitons, Impaired flexibility, Impaired perceived functional ability, Impaired sensation, Impaired tone, Impaired UE functional use, Postural dysfunction, Improper body mechanics, Pain, Obesity  Visit Diagnosis: Muscle weakness (generalized)  Other lack of coordination  Other abnormalities of gait and mobility     Problem List Patient Active Problem List   Diagnosis Date Noted  . [place] vascular disease ([place] 05/21/2017  . Gangrene of finger of right hand ([place] 05/21/2017    MAlanson Puls PT DPT 09/01/2019, 9:12 AM  [place] [facility] [address] [place] [address]Phone: [phone]  Fax:  [phone] Name: Ariel KLOCMRN: [id number] of Birth: [birth year]

## 2019-09-03 ENCOUNTER — Other Ambulatory Visit: Payer: Self-pay

## 2019-09-03 ENCOUNTER — Ambulatory Visit: Payer: Medicare Other | Admitting: Physical Therapy

## 2019-09-03 ENCOUNTER — Encounter: Payer: Self-pay | Admitting: Occupational Therapy

## 2019-09-03 ENCOUNTER — Encounter: Payer: Self-pay | Admitting: Physical Therapy

## 2019-09-03 ENCOUNTER — Ambulatory Visit: Payer: Medicare Other | Admitting: Occupational Therapy

## 2019-09-03 DIAGNOSIS — R2689 Other abnormalities of gait and mobility: Secondary | ICD-10-CM

## 2019-09-03 DIAGNOSIS — M6281 Muscle weakness (generalized): Secondary | ICD-10-CM | POA: Diagnosis not present

## 2019-09-03 DIAGNOSIS — R278 Other lack of coordination: Secondary | ICD-10-CM

## 2019-09-03 DIAGNOSIS — R29810 Facial weakness: Secondary | ICD-10-CM

## 2019-09-03 NOTE — Therapy (Signed)
[place] [facility] MAIN [facility] SERVICES [address] [place], [place], [address] Phone: [phone]   Fax:  [phone]  Occupational Therapy Treatment  Patient Details  Name: Ariel Alvarez MRN: [id number] Date of Birth: Oct 03, 1946 No data recorded  Encounter Date: 09/03/2019  OT End of Session - 09/03/19 1220    Visit Number  17    Number of Visits  24    Date for OT Re-Evaluation  11/17/19    Authorization Type  Progress report period starting 917/2020    OT Start Time  0840    OT Stop Time  0925    OT Time Calculation (min)  45 min    Equipment Utilized During Treatment  LUE ROM, self-ROM, RUE grip strength, and coordination    Activity Tolerance  Patient tolerated treatment well    Behavior During Therapy  WFL for tasks assessed/performed       Past Medical History:  Diagnosis Date  . Anxiety   . Collagen vascular disease (HCC)   . Depression   . Stroke [facility])     Past Surgical History:  Procedure Laterality Date  . CHOLECYSTECTOMY      There were no vitals filed for this visit.  Subjective Assessment - 09/03/19 1219    Subjective   Pt. reports no pain today    Patient is accompanied by:  Family member    Pertinent History  Pt. is a 73 y.o. female who sustained multiple CVAs with left hemiplegia, Pt. PMHx includes: Depression, Collagen Vascular Disease, Anxiety. Pt. recieved therapy services in thre past. Pt. resides at home with her husband who is supportive, and assists with her care. Pt. is alone during the day, as her husband works.    Currently in Pain?  No/denies       OT TREATMENT    Neuro muscular re-education:  Pt. worked on right hand grasping, and manipulating 1/2", 3/4/", and 1" washers from a magnetic dish using point grasp pattern. Pt. worked on reaching up, stabilizing, and sustaining shoulder elevation while placing the washer over a small precise target on vertical dowels positioned at various angles. Pt.  performed [facility] tasks using the Grooved pegboard. Pt. worked on grasping the grooved pegs from a horizontal position, and moving the pegs to a vertical position in the hand to prepare for placing them in the grooved slot.   Therapeutic Exercise:  Pt. Tolerated PROM in all joint ranges of the LUE, and handwith emphasis placed on slow gentle prolonged stretching of the hand, wrist, and digits. Pt. Education was provided about self-ROM  Response to Treatment:  Pt. continues to present with increased flexor tone, and tightness in the left UE, and hand. Pt. requires visual cues, and hand-over hand assistance for self-ROM to the left wrist, and fingers. Pt. is tolerating ROM welll. Pt. continues to work on improving right hand [facility] skills in order to improve, and increase engagaement in ADLs, and IADL tasks.                    OT Education - 09/03/19 1220    Education Details  ROM    [name]  Patient    Methods  Explanation;Demonstration;Verbal cues    Comprehension  Verbal cues required;Returned demonstration;Verbalized understanding          OT Long Term Goals - 09/01/19 0939      OT LONG TERM GOAL #1   Title  Pt. will improve RUE strength to be  be able to reach into cabinetry to put items away without compensating posteriorly with her trunk.    Baseline  Pt. continues to lean posteriorly when reaching with her RUE    Time  12    Period  Weeks    Status  On-going    Target Date  11/17/19      OT LONG TERM GOAL #2   Title  Pt. will independently complete light meal preparation in standing    Baseline  Pt. continues to have difficulty    Time  12    Period  Weeks    Status  On-going    Target Date  11/17/19      OT LONG TERM GOAL #3   Title  Pt. will improve right hand [facility] skills to be able to independently apply hook earrings.    Baseline  Pt. continues to be unable to place earrings.    Time  12    Period  Weeks    Status  On-going    Target Date   11/17/19      OT LONG TERM GOAL #4   Title  Pt. will improve right grip strength to be able to hold a can at the can opener    Baseline  Pt. continues to be unable to hold, and use a can opener    Time  12    Period  Weeks    Status  New    Target Date  11/17/19      OT LONG TERM GOAL #5   Title  Pt. will require supervision to sweep the floor.    Baseline  Pt. is unable    Time  12    Period  Weeks    Status  On-going    Target Date  11/17/19            Plan - 09/03/19 1221    Clinical Impression Statement Pt. continues to present with increased flexor tone, and tightness in the left UE, and hand. Pt. requires visual cues, and hand-over hand assistance for self-ROM to the left wrist, and fingers. Pt. is tolerating ROM welll. Pt. continues to work on improving right hand [facility] skills in order to improve, and increase engagaement in ADLs, and IADL tasks.    Occupational performance deficits (Please refer to evaluation for details):  ADL's;IADL's    Body Structure / Function / Physical Skills  ADL;IADL;FMC;ROM;Pain;UE functional use;Strength;Dexterity    Rehab Potential  Good    Clinical Decision Making  Several treatment options, min-mod task modification necessary    Comorbidities Affecting Occupational Performance:  Presence of comorbidities impacting occupational performance    Comorbidities impacting occupational performance description:  histroy of strokes, obesity, decreased balance    Modification or Assistance to Complete Evaluation   Min-Moderate modification of tasks or assist with assess necessary to complete eval    OT Frequency  2x / week    OT Duration  12 weeks    OT Treatment/Interventions  Self-care/ADL training;Neuromuscular education;Manual Therapy;Therapeutic activities;Therapeutic exercise;Patient/family education    Consulted and Agree with Plan of Care  Patient       Patient will benefit from skilled therapeutic intervention in order to improve the  following deficits and impairments:   Body Structure / Function / Physical Skills: ADL, IADL, FMC, ROM, Pain, UE functional use, Strength, Dexterity       Visit Diagnosis: Facial weakness  Other lack of coordination    Problem List Patient Active Problem List  Diagnosis Date Noted  . Collagen vascular disease (HCC) 05/21/2017  . 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

## 2019-09-03 NOTE — Therapy (Signed)
[place] MAIN [facility] SERVICES [address] [place], [place], [address] Phone: [phone]   Fax:  [phone]  Physical Therapy Treatment  Patient Details  Name: Ariel Alvarez MRN: [id number] Date of Birth: 16-Apr-1946 Referring Provider (PT): Okey Regal   Encounter Date: 09/03/2019  PT End of Session - 09/03/19 0812    Visit Number  29    Number of Visits  33    Date for PT Re-Evaluation  08/05/19    PT Start Time  0801    PT Stop Time  0840    PT Time Calculation (min)  39 min    Equipment Utilized During Treatment  Gait belt    Activity Tolerance  Patient limited by fatigue;Patient limited by pain    Behavior During Therapy  [facility] for tasks assessed/performed       Past Medical History:  Diagnosis Date  . Anxiety   . Collagen vascular disease ([place])   . Depression   . Stroke [facility])     Past Surgical History:  Procedure Laterality Date  . CHOLECYSTECTOMY      There were no vitals filed for this visit.  Subjective Assessment - 09/03/19 0810    Subjective  Patient reports that she is better. no new concerns.    Patient is accompained by:  Family member    Pertinent History  Patient presents for instability s/p CVA in 01/2017. Patient was recently diagnosed with osteoporosis in bilateral legs and feet. Is potentially getting surgery in 2 weeks on both feet. After discharge from outpatient physical therapy had one session with home health PT but no other therapy. Reports no falls. Reports walking from living room to kitchen and back to walkway however her LLE gives out frequently . Per husband and patient she was going to an adult day center prior to [place]. Patient has a flat/level entry. Trying to get a power chair at this time from [employer]. Has a manual chair at this time, walker, hemi walker, quad cane. Doing bird baths but has a walk in tub she can't get in, does have a shower chair. Patient and husband aware  that patient will need caregiver with her due to toileting needs.    Limitations  Sitting;Lifting;Standing;Walking;House hold activities    How long can you sit comfortably?  recliner 5 hours, 30 minute sin wheelchair    How long can you stand comfortably?  4 minutes with UE support    How long can you walk comfortably?  15 ft    Patient Stated Goals  to walk better    Currently in Pain?  Yes    Pain Score  3     Pain Location  Calf    Pain Orientation  Left;Right    Pain Descriptors / Indicators  Aching    Pain Type  Acute pain    Pain Onset  Yesterday    Pain Frequency  Intermittent    Aggravating Factors   standing    Pain Relieving Factors  stretching    Effect of Pain on Daily Activities  difficult to walk    Multiple Pain Sites  No       Treatment: Nu-step x 5 mins L 2 with assist of LLE hip from abduction PROM to left ankle with stretches to heel cord x 30 sec x 3  Manual therapy to left calf and left hamstring with rolling stick to decrease spasticity and tone with min/mod resistance Gait training  with hemi walker and cGA and wc following with Vc for cues for posture and sequencing 50 feet x 1, 40 feet x 2 Gait training with hemiwalker and side stepping left and backwards stepping with mod cues and direction of hemiwalker placement and foot placement    Pt educated throughout session about proper posture and technique with exercises. Improved exercise technique, movement at target joints, use of target muscles after min to mod verbal, visual, tactile cues.                      PT Education - 09/03/19 0811    Education Details  HEP    [name]  Patient    Methods  Explanation;Tactile cues;Verbal cues    Comprehension  Returned demonstration;Verbal cues required;Tactile cues required;Need further instruction       PT Short Term Goals - 05/21/19 1446      PT SHORT TERM GOAL #1   Title  Patient will perform one sit to stand independently  from wheelchair to increase independence with transfers    Baseline  6/17: min/mod A    Time  2    Period  Weeks    Status  Partially Met    Target Date  04/29/19      PT SHORT TERM GOAL #2   Title  Patient will be independent in home exercise program to improve strength/mobility for better functional independence with ADLs.    Baseline  HEP to be given next session    Time  2    Status  Partially Met    Target Date  04/29/19        PT Long Term Goals - 09/03/19 0947      PT LONG TERM GOAL #1   Title  Patient will perform 5 STS < 40 seconds independently with hemi walker to demonstrate improved home mobility.    Baseline  6/17: unable to perform one time without Min/Mod A, 05/21/19 1.06 sec: 07/02/19=56 sec,    Time  12    Period  Weeks    Status  Partially Met    Target Date  10/27/19      PT LONG TERM GOAL #2   Title  Patient will stand without UE support for 3-5 minutes for ADL performance without LOB to increase independence with dressing and toileting.    Baseline  6/17: unable to stand without UE support, 05/21/19 1 min 30 sec:07/02/19= 3 min    Time  12    Period  Weeks    Status  Partially Met    Target Date  10/27/19      PT LONG TERM GOAL #3   Title  Patient will ambulate 60 ft with hemi walker and CGA to improve home mobility and independence with gait.     Baseline  6/17: 10 ft, 05/21/19 17 feet: 07/02/19 = 50 feet    Time  12    Period  Weeks    Status  Partially Met    Target Date  10/27/19      PT LONG TERM GOAL #4   Title  Patient will increase BLE gross strength to 4+/5 as to improve functional strength for independent gait, increased standing tolerance and increased ADL ability    Baseline  6/17: hips grossly 2+/5 knees 3+/5:     07/02/19: R hip  flex -3/5, L hip flex 2/5 knees 3+/5    Time  8    Period  Weeks  Status  Partially Met    Target Date  10/27/19            Plan - 09/03/19 0936    Clinical Impression Statement  Patient instructed in  intermediate gait challenges. Patient required min-mod VCs for correct positioning; Patient had increased difficulty with dynamic steps and dual task movements especially unsupported. Patient would benefit from additional skilled PT intervention to improve strength, balance.   Personal Factors and Comorbidities  Behavior Pattern;Comorbidity 3+;Fitness;Past/Current Experience;Social Background;Time since onset of injury/illness/exacerbation;Transportation    Comorbidities  anxiety, collagen vascular disease, depression, stroke, amputated digit    Examination-Activity Limitations  Bathing;Bed Mobility;Bend;Caring for Others;Carry;Continence;Dressing;Sleep;Sit;Reach Overhead;Locomotion Level;Lift;Hygiene/Grooming;Squat;Stairs;Stand;Toileting;Transfers    Examination-Participation Restrictions  Church;Cleaning;Community Activity;Driving;Interpersonal Relationship;Meal Prep;Medication Management;Laundry;Shop;Volunteer;Yard Work    [employer] officer  Evolving/Moderate complexity    Rehab Potential  Fair    PT Frequency  2x / week    PT Treatment/Interventions  ADLs/Self Care Home Management;Aquatic Therapy;Cryotherapy;Biofeedback;Electrical Stimulation;Iontophoresis 56m/ml Dexamethasone;Moist Heat;Traction;Ultrasound;Functional mobility training;Stair training;Gait training;DME Instruction;Therapeutic activities;Therapeutic exercise;Balance training;Neuromuscular re-education;Orthotic Fit/Training;Patient/family education;Cognitive remediation;Wheelchair mobility training;Manual techniques;Taping;Splinting;Energy conservation    PT Next Visit Plan  give HEP, practice SPT, ambulation, COM    PT Home Exercise Plan  Reviewed LAQs and alt hip flexion with pt today; pt able to verbalize and demonstrate understanding       Patient will benefit from skilled therapeutic intervention in order to improve the following deficits and impairments:  Abnormal gait, Decreased activity tolerance, Decreased  coordination, Decreased balance, Decreased endurance, Decreased knowledge of precautions, Decreased knowledge of use of DME, Decreased safety awareness, Decreased range of motion, Decreased mobility, Decreased strength, Difficulty walking, Hypomobility, Increased fascial restricitons, Impaired flexibility, Impaired perceived functional ability, Impaired sensation, Impaired tone, Impaired UE functional use, Postural dysfunction, Improper body mechanics, Pain, Obesity  Visit Diagnosis: Muscle weakness (generalized)  Other lack of coordination  Other abnormalities of gait and mobility     Problem List Patient Active Problem List   Diagnosis Date Noted  . Collagen vascular disease ([place] 05/21/2017  . Gangrene of finger of right hand ([place] 05/21/2017    MAlanson Puls PT DPT 09/03/2019, 9:50 AM  [place] [facility] [address] [address]Phone: [phone]  Fax:  [phone] Name: Ariel WARBINGTONMRN: [id number] of Birth: [birth year]

## 2019-09-08 ENCOUNTER — Encounter: Payer: Self-pay | Admitting: Occupational Therapy

## 2019-09-08 ENCOUNTER — Ambulatory Visit: Payer: Medicare Other | Admitting: Occupational Therapy

## 2019-09-08 ENCOUNTER — Encounter: Payer: Self-pay | Admitting: Physical Therapy

## 2019-09-08 ENCOUNTER — Ambulatory Visit: Payer: Medicare Other | Admitting: Physical Therapy

## 2019-09-08 ENCOUNTER — Other Ambulatory Visit: Payer: Self-pay

## 2019-09-08 DIAGNOSIS — M6281 Muscle weakness (generalized): Secondary | ICD-10-CM | POA: Diagnosis not present

## 2019-09-08 DIAGNOSIS — R278 Other lack of coordination: Secondary | ICD-10-CM

## 2019-09-08 DIAGNOSIS — R2689 Other abnormalities of gait and mobility: Secondary | ICD-10-CM

## 2019-09-08 NOTE — Therapy (Signed)
[place] MAIN [facility] [place] SERVICES [address] [place], [place], [address] Phone: [phone]   Fax:  [phone]  Physical Therapy Treatment  Physical Therapy Progress Note   Dates of reporting period 07/02/19   to  09/08/19  Patient Details  Name: Ariel Alvarez MRN: [id number] Date of Birth: [birth year] Referring Provider (PT): Okey Regal   Encounter Date: 09/08/2019  PT End of Session - 09/08/19 0953    Visit Number  30    Number of Visits  33    Date for PT Re-Evaluation  08/05/19    PT Start Time  0800    PT Stop Time  0840    PT Time Calculation (min)  40 min    Equipment Utilized During Treatment  Gait belt    Activity Tolerance  Patient limited by fatigue;Patient limited by pain    Behavior During Therapy  WFL for tasks assessed/performed       Past Medical History:  Diagnosis Date  . Anxiety   . Collagen vascular disease ([place])   . Depression   . Stroke [facility])     Past Surgical History:  Procedure Laterality Date  . CHOLECYSTECTOMY      There were no vitals filed for this visit.  Subjective Assessment - 09/08/19 0952    Subjective  Patient reports that she is better. no new concerns.    Patient is accompained by:  Family member    Pertinent History  Patient presents for instability s/p CVA in 01/2017. Patient was recently diagnosed with osteoporosis in bilateral legs and feet. Is potentially getting surgery in 2 weeks on both feet. After discharge from outpatient physical therapy had one session with home health PT but no other therapy. Reports no falls. Reports walking from living room to kitchen and back to walkway however her LLE gives out frequently . Per husband and patient she was going to an adult day center prior to [place]. Patient has a flat/level entry. Trying to get a power chair at this time from [employer]. Has a manual chair at this time, walker, hemi walker, quad cane. Doing bird baths but  has a walk in tub she can't get in, does have a shower chair. Patient and husband aware that patient will need caregiver with her due to toileting needs.    Limitations  Sitting;Lifting;Standing;Walking;House hold activities    How long can you sit comfortably?  recliner 5 hours, 30 minute sin wheelchair    How long can you stand comfortably?  4 minutes with UE support    How long can you walk comfortably?  15 ft    Patient Stated Goals  to walk better    Currently in Pain?  No/denies    Pain Score  0-No pain    Pain Onset  Yesterday           Treatment Transfer training with hemi walker sit to stand with VC for posture and safety  Therapeutic exercise: Nu-step x 5 mins L 4  PROM; L Ankle heel cord stretch  Gait training with hemi walker 40 feet x 3 with cGA and WC following and cues for posture and sequencing   Outcome measures performed to address goals today with progress made towards goals Patient performed with instruction, verbal cues, tactile cues of therapist: goal:increase tissue extensibility, promote proper posture, improve mobility                     PT  Education - 09/08/19 [phone]    Education Details  HEP    [name]  Patient    Methods  Explanation    Comprehension  Returned demonstration;Tactile cues required;Verbal cues required;Need further instruction       PT Short Term Goals - 05/21/19 1446      PT SHORT TERM GOAL #1   Title  Patient will perform one sit to stand independently from wheelchair to increase independence with transfers    Baseline  6/17: min/mod A    Time  2    Period  Weeks    Status  Partially Met    Target Date  04/29/19      PT SHORT TERM GOAL #2   Title  Patient will be independent in home exercise program to improve strength/mobility for better functional independence with ADLs.    Baseline  HEP to be given next session    Time  2    Status  Partially Met    Target Date  04/29/19        PT  Long Term Goals - 09/08/19 0954      PT LONG TERM GOAL #1   Title  Patient will perform 5 STS < 40 seconds independently with hemi walker to demonstrate improved home mobility.    Baseline  6/17: unable to perform one time without Min/Mod A, 05/21/19 1.06 sec: 07/02/19=56 sec,09/08/19= 52 sec    Time  12    Period  Weeks    Status  On-going    Target Date  10/27/19      PT LONG TERM GOAL #2   Title  Patient will stand without UE support for 3-5 minutes for ADL performance without LOB to increase independence with dressing and toileting.    Baseline  6/17: unable to stand without UE support, 05/21/19 1 min 30 sec:07/02/19= 3 min, 09/08/19 3 1/2 mins    Time  12    Period  Weeks    Status  On-going    Target Date  10/27/19      PT LONG TERM GOAL #3   Title  Patient will ambulate 60 ft with hemi walker and CGA to improve home mobility and independence with gait.     Baseline  6/17: 10 ft, 05/21/19 17 feet: 07/02/19 = 50 feet, 09/08/19 50 feet    Time  12    Period  Weeks    Status  On-going    Target Date  10/27/19      PT LONG TERM GOAL #4   Title  Patient will increase BLE gross strength to 4+/5 as to improve functional strength for independent gait, increased standing tolerance and increased ADL ability    Baseline  6/17: hips grossly 2+/5 knees 3+/5:     07/02/19: R hip  flex -3/5, L hip flex 2/5 knees 3+/5,     11/10//20: R hip  flex -3/5, L hip flex 2/5 knees 3+/5    Time  12    Period  Weeks    Status  On-going    Target Date  10/27/19      PT LONG TERM GOAL #5   Title  Patient will increase lower extremity functional scale to >30/80 to demonstrate improved functional mobility and increased tolerance with ADLs.     Baseline  09/08/19 = 28/80    Time  12    Period  Weeks    Status  New    Target Date  10/27/19  Plan - 09/08/19 0953    Clinical Impression Statement  Patient's condition has the potential to improve in response to therapy. Maximum improvement is yet  to be obtained. The anticipated improvement is attainable and reasonable in a generally predictable time.  Patient reports that she feels she can stand and walk longer and more safely. She is making progress towards her goals of standing and ambulating household distances.  Patient instructed in intermediate mobility challenges, transfer training and safety training. Patient required min-mod VCs for correct positioning; Patient had increased difficulty with dynamic balance challenges and dule task movements especially in standing.  Patient would benefit from additional skilled PT intervention to improve strength, balance.   Personal Factors and Comorbidities  Behavior Pattern;Comorbidity 3+;Fitness;Past/Current Experience;Social Background;Time since onset of injury/illness/exacerbation;Transportation    Comorbidities  anxiety, collagen vascular disease, depression, stroke, amputated digit    Examination-Activity Limitations  Bathing;Bed Mobility;Bend;Caring for Others;Carry;Continence;Dressing;Sleep;Sit;Reach Overhead;Locomotion Level;Lift;Hygiene/Grooming;Squat;Stairs;Stand;Toileting;Transfers    Examination-Participation Restrictions  Church;Cleaning;Community Activity;Driving;Interpersonal Relationship;Meal Prep;Medication Management;Laundry;Shop;Volunteer;Yard Work    [employer]chant [employer] officer  Evolving/Moderate complexity    Rehab Potential  Fair    PT Frequency  2x / week    PT Treatment/Interventions  ADLs/Self Care Home Management;Aquatic Therapy;Cryotherapy;Biofeedback;Electrical Stimulation;Iontophoresis 63m/ml Dexamethasone;Moist Heat;Traction;Ultrasound;Functional mobility training;Stair training;Gait training;DME Instruction;Therapeutic activities;Therapeutic exercise;Balance training;Neuromuscular re-education;Orthotic Fit/Training;Patient/family education;Cognitive remediation;Wheelchair mobility training;Manual techniques;Taping;Splinting;Energy conservation    PT Next Visit Plan   give HEP, practice SPT, ambulation, COM    PT Home Exercise Plan  Reviewed LAQs and alt hip flexion with pt today; pt able to verbalize and demonstrate understanding       Patient will benefit from skilled therapeutic intervention in order to improve the following deficits and impairments:  Abnormal gait, Decreased activity tolerance, Decreased coordination, Decreased balance, Decreased endurance, Decreased knowledge of precautions, Decreased knowledge of use of DME, Decreased safety awareness, Decreased range of motion, Decreased mobility, Decreased strength, Difficulty walking, Hypomobility, Increased fascial restricitons, Impaired flexibility, Impaired perceived functional ability, Impaired sensation, Impaired tone, Impaired UE functional use, Postural dysfunction, Improper body mechanics, Pain, Obesity  Visit Diagnosis: Muscle weakness (generalized)  Other abnormalities of gait and mobility  Other lack of coordination     Problem List Patient Active Problem List   Diagnosis Date Noted  . Collagen vascular disease ([place] 05/21/2017  . Gangrene of finger of right hand ([place] 05/21/2017    MAlanson Puls PT DPT 09/08/2019, 10:01 AM  [place] [facility] [address] [address]Phone: [phone]  Fax:  [phone] Name: SCARIANNA LAGUEMRN: [id number] of Birth: 104-May-1947

## 2019-09-08 NOTE — Therapy (Signed)
[facility] MAIN [facility] SERVICES [address] [place], [place], [address] Phone: [phone]   Fax:  [phone]  Occupational Therapy Treatment  Patient Details  Name: Ariel Alvarez MRN: [id number] Date of Birth: 1946-02-15 No data recorded  Encounter Date: 09/08/2019  OT End of Session - 09/08/19 1157    Visit Number  18    Number of Visits  24    Date for OT Re-Evaluation  11/17/19    Authorization Type  Progress report period starting 917/2020    OT Start Time  0845    OT Stop Time  0930    OT Time Calculation (min)  45 min    Equipment Utilized During Treatment  LUE ROM, self-ROM, RUE grip strength, and coordination    Activity Tolerance  Patient tolerated treatment well    Behavior During Therapy  WFL for tasks assessed/performed       Past Medical History:  Diagnosis Date  . Anxiety   . Collagen vascular disease (HCC)   . Depression   . Stroke [facility])     Past Surgical History:  Procedure Laterality Date  . CHOLECYSTECTOMY      There were no vitals filed for this visit.  Subjective Assessment - 09/08/19 1157    Subjective   Pt. reports no pain today    Patient is accompanied by:  Family member    Pertinent History  Pt. is a 73 y.o. female who sustained multiple CVAs with left hemiplegia, Pt. PMHx includes: Depression, Collagen Vascular Disease, Anxiety. Pt. recieved therapy services in thre past. Pt. resides at home with her husband who is supportive, and assists with her care. Pt. is alone during the day, as her husband works.    Currently in Pain?  No/denies       OT TREATMENT    Neuro muscular re-education:  Pt. worked on on using her left hand for grasping 2" large pegs for the [employer], and copying [employer] patterns. Pt.   Therapeutic Exercise:  Pt. Tolerated PROM in all joint ranges of the LUE, and handwith emphasis placed on slow gentle prolonged stretching of the hand, wrist, and  digits.Pt. education was provided about self-ROM. Pt. Reports having difficulty with digit extension secondary to flexor tightness. Pt. education was provided LUE positioning during ROM.  Response to Treatment:  Pt. refers to the LUE using pronouns she, and her. Pt. continues to present with increased flexor tone, and tightness in the LUE, and hand. Pt. continues to require hand-over-hand assist for self-ROM to the left wrist, and fingers. Pt. required verbal, and visual cues for accuracy when completing the [employer] board copying [employer]. Pt. continues to work on improving LUE functioning during ADLs, and IADL tasks.                        OT Education - 09/08/19 1157    Education Details  ROM    [name]  Patient    Methods  Explanation;Demonstration;Verbal cues    Comprehension  Verbal cues required;Returned demonstration;Verbalized understanding          OT Long Term Goals - 09/01/19 0939      OT LONG TERM GOAL #1   Title  Pt. will improve RUE strength to be be able to reach into cabinetry to put items away without compensating posteriorly with her trunk.    Baseline  Pt. continues to lean posteriorly when reaching with her  RUE    Time  12    Period  Weeks    Status  On-going    Target Date  11/17/19      OT LONG TERM GOAL #2   Title  Pt. will independently complete light meal preparation in standing    Baseline  Pt. continues to have difficulty    Time  12    Period  Weeks    Status  On-going    Target Date  11/17/19      OT LONG TERM GOAL #3   Title  Pt. will improve right hand [facility] skills to be able to independently apply hook earrings.    Baseline  Pt. continues to be unable to place earrings.    Time  12    Period  Weeks    Status  On-going    Target Date  11/17/19      OT LONG TERM GOAL #4   Title  Pt. will improve right grip strength to be able to hold a can at the can opener    Baseline  Pt. continues to be unable to  hold, and use a can opener    Time  12    Period  Weeks    Status  New    Target Date  11/17/19      OT LONG TERM GOAL #5   Title  Pt. will require supervision to sweep the floor.    Baseline  Pt. is unable    Time  12    Period  Weeks    Status  On-going    Target Date  11/17/19            Plan - 09/08/19 1158    Clinical Impression Statement Pt. refers to the LUE using pronouns she, and her. Pt. continues to present with increased flexor tone, and tightness in the LUE, and hand. Pt. continues to require hand-over-hand assist for self-ROM to the left wrist, and fingers. Pt. required verbal, and visual cues for accuracy when completing the [employer] board copying [employer]. Pt. continues to work on improving LUE functioning during ADLs, and IADL tasks.    OT Occupational Profile and History  Comprehensive Assessment- Review of records and extensive additional review of physical, cognitive, psychosocial history related to current functional performance    Occupational performance deficits (Please refer to evaluation for details):  ADL's;IADL's    Body Structure / Function / Physical Skills  ADL;IADL;FMC;ROM;Pain;UE functional use;Strength;Dexterity    Rehab Potential  Good    Clinical Decision Making  Several treatment options, min-mod task modification necessary    Comorbidities Affecting Occupational Performance:  Presence of comorbidities impacting occupational performance    Comorbidities impacting occupational performance description:  histroy of strokes, obesity, decreased balance    Modification or Assistance to Complete Evaluation   Min-Moderate modification of tasks or assist with assess necessary to complete eval    OT Frequency  2x / week    OT Duration  12 weeks    OT Treatment/Interventions  Self-care/ADL training;Neuromuscular education;Manual Therapy;Therapeutic activities;Therapeutic exercise;Patient/family education    Consulted and Agree with Plan of Care   Patient       Patient will benefit from skilled therapeutic intervention in order to improve the following deficits and impairments:   Body Structure / Function / Physical Skills: ADL, IADL, FMC, ROM, Pain, UE functional use, Strength, Dexterity       Visit Diagnosis: Muscle weakness (generalized)  Other lack of coordination  Problem List Patient Active Problem List   Diagnosis Date Noted  . Collagen vascular disease (HCC) 05/21/2017  . 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

## 2019-09-10 ENCOUNTER — Ambulatory Visit: Payer: Medicare Other | Admitting: Occupational Therapy

## 2019-09-10 ENCOUNTER — Encounter: Payer: Self-pay | Admitting: Physical Therapy

## 2019-09-10 ENCOUNTER — Encounter: Payer: Self-pay | Admitting: Occupational Therapy

## 2019-09-10 ENCOUNTER — Other Ambulatory Visit: Payer: Self-pay

## 2019-09-10 ENCOUNTER — Ambulatory Visit: Payer: Medicare Other | Admitting: Physical Therapy

## 2019-09-10 DIAGNOSIS — M6281 Muscle weakness (generalized): Secondary | ICD-10-CM

## 2019-09-10 DIAGNOSIS — R2689 Other abnormalities of gait and mobility: Secondary | ICD-10-CM

## 2019-09-10 DIAGNOSIS — R278 Other lack of coordination: Secondary | ICD-10-CM

## 2019-09-10 DIAGNOSIS — R29810 Facial weakness: Secondary | ICD-10-CM

## 2019-09-10 NOTE — Therapy (Signed)
[place] MAIN [facility] SERVICES [address] [place], [place], [address] Phone: [phone]   Fax:  [phone]  Physical Therapy Treatment  Patient Details  Name: Ariel Alvarez MRN: [id number] Date of Birth: 11/13/1945 Referring Provider (PT): Okey Regal   Encounter Date: 09/10/2019  PT End of Session - 09/10/19 0827    Visit Number  31    Number of Visits  33    Date for PT Re-Evaluation  10/27/19    Authorization Type  09/08/19 beginning of progress report    PT Start Time  0802    PT Stop Time  0845    PT Time Calculation (min)  43 min    Equipment Utilized During Treatment  Gait belt    Activity Tolerance  Patient limited by fatigue;Patient limited by pain    Behavior During Therapy  [facility] for tasks assessed/performed       Past Medical History:  Diagnosis Date  . Anxiety   . Collagen vascular disease ([place])   . Depression   . Stroke [facility])     Past Surgical History:  Procedure Laterality Date  . CHOLECYSTECTOMY      There were no vitals filed for this visit.  Subjective Assessment - 09/10/19 0826    Subjective  Patient reports that she is better. no new concerns.    Patient is accompained by:  Family member    Pertinent History  Patient presents for instability s/p CVA in 01/2017. Patient was recently diagnosed with osteoporosis in bilateral legs and feet. Is potentially getting surgery in 2 weeks on both feet. After discharge from outpatient physical therapy had one session with home health PT but no other therapy. Reports no falls. Reports walking from living room to kitchen and back to walkway however her LLE gives out frequently . Per husband and patient she was going to an adult day center prior to [facility]. Patient has a flat/level entry. Trying to get a power chair at this time from [employer]. Has a manual chair at this time, walker, hemi walker, quad cane. Doing bird baths but has a walk in tub she  can't get in, does have a shower chair. Patient and husband aware that patient will need caregiver with her due to toileting needs.    Limitations  Sitting;Lifting;Standing;Walking;House hold activities    How long can you sit comfortably?  recliner 5 hours, 30 minute sin wheelchair    How long can you stand comfortably?  4 minutes with UE support    How long can you walk comfortably?  15 ft    Patient Stated Goals  to walk better    Currently in Pain?  No/denies    Pain Score  0-No pain    Pain Onset  Yesterday         Therapeutic exercise: Nu-step x 5 mins L 4  Supine: SLR x 15 BLE, assist for LLE Hookling marching x 15 , assist for correct technique LLE Hooklying abd/ER x 15  Bridging x 15, LLE is depressed SAQ x 15 BLE Hip abd/add x 15, BLE, Assisted LLE Heel slides x 15, BLE, assisted LLE  Gait training with hemiwalker 50 feet x 2 with wc following  Patient performed with instruction, verbal cues, tactile cues of therapist: goal: increase tissue extensibility, promote proper posture, improve mobility                         PT  Education - 09/10/19 0826    Education Details  HEP    [name]  Patient    Methods  Explanation;Tactile cues;Verbal cues    Comprehension  Returned demonstration;Verbal cues required;Tactile cues required;Need further instruction       PT Short Term Goals - 05/21/19 1446      PT SHORT TERM GOAL #1   Title  Patient will perform one sit to stand independently from wheelchair to increase independence with transfers    Baseline  6/17: min/mod A    Time  2    Period  Weeks    Status  Partially Met    Target Date  04/29/19      PT SHORT TERM GOAL #2   Title  Patient will be independent in home exercise program to improve strength/mobility for better functional independence with ADLs.    Baseline  HEP to be given next session    Time  2    Status  Partially Met    Target Date  04/29/19        PT Long Term  Goals - 09/08/19 0954      PT LONG TERM GOAL #1   Title  Patient will perform 5 STS < 40 seconds independently with hemi walker to demonstrate improved home mobility.    Baseline  6/17: unable to perform one time without Min/Mod A, 05/21/19 1.06 sec: 07/02/19=56 sec,09/08/19= 52 sec    Time  12    Period  Weeks    Status  On-going    Target Date  10/27/19      PT LONG TERM GOAL #2   Title  Patient will stand without UE support for 3-5 minutes for ADL performance without LOB to increase independence with dressing and toileting.    Baseline  6/17: unable to stand without UE support, 05/21/19 1 min 30 sec:07/02/19= 3 min, 09/08/19 3 1/2 mins    Time  12    Period  Weeks    Status  On-going    Target Date  10/27/19      PT LONG TERM GOAL #3   Title  Patient will ambulate 60 ft with hemi walker and CGA to improve home mobility and independence with gait.     Baseline  6/17: 10 ft, 05/21/19 17 feet: 07/02/19 = 50 feet, 09/08/19 50 feet    Time  12    Period  Weeks    Status  On-going    Target Date  10/27/19      PT LONG TERM GOAL #4   Title  Patient will increase BLE gross strength to 4+/5 as to improve functional strength for independent gait, increased standing tolerance and increased ADL ability    Baseline  6/17: hips grossly 2+/5 knees 3+/5:     07/02/19: R hip  flex -3/5, L hip flex 2/5 knees 3+/5,     11/10//20: R hip  flex -3/5, L hip flex 2/5 knees 3+/5    Time  12    Period  Weeks    Status  On-going    Target Date  10/27/19      PT LONG TERM GOAL #5   Title  Patient will increase lower extremity functional scale to >30/80 to demonstrate improved functional mobility and increased tolerance with ADLs.     Baseline  09/08/19 = 28/80    Time  12    Period  Weeks    Status  New    Target Date  10/27/19  Plan - 09/10/19 0827    Clinical Impression Statement  Patient   instructed in transfers with rest periods standing. Patient performs intermediate LE exercises in  supine and sitting open and closed chain. Patient will continue to benefit from skilled PT to improve strength, balance and gait. Patient continues to have LE weakness and decreased static and dynamic standing balance.   Personal Factors and Comorbidities  Behavior Pattern;Comorbidity 3+;Fitness;Past/Current Experience;Social Background;Time since onset of injury/illness/exacerbation;Transportation    Comorbidities  anxiety, collagen vascular disease, depression, stroke, amputated digit    Examination-Activity Limitations  Bathing;Bed Mobility;Bend;Caring for Others;Carry;Continence;Dressing;Sleep;Sit;Reach Overhead;Locomotion Level;Lift;Hygiene/Grooming;Squat;Stairs;Stand;Toileting;Transfers    Examination-Participation Restrictions  Church;Cleaning;Community Activity;Driving;Interpersonal Relationship;Meal Prep;Medication Management;Laundry;Shop;Volunteer;Yard Work    Merchant navy officer  Evolving/Moderate complexity    Rehab Potential  Fair    PT Frequency  2x / week    PT Treatment/Interventions  ADLs/Self Care Home Management;Aquatic Therapy;Cryotherapy;Biofeedback;Electrical Stimulation;Iontophoresis 43m/ml Dexamethasone;Moist Heat;Traction;Ultrasound;Functional mobility training;Stair training;Gait training;DME Instruction;Therapeutic activities;Therapeutic exercise;Balance training;Neuromuscular re-education;Orthotic Fit/Training;Patient/family education;Cognitive remediation;Wheelchair mobility training;Manual techniques;Taping;Splinting;Energy conservation    PT Next Visit Plan  give HEP, practice SPT, ambulation, COM    PT Home Exercise Plan  Reviewed LAQs and alt hip flexion with pt today; pt able to verbalize and demonstrate understanding       Patient will benefit from skilled therapeutic intervention in order to improve the following deficits and impairments:  Abnormal gait, Decreased activity tolerance, Decreased coordination, Decreased balance, Decreased endurance,  Decreased knowledge of precautions, Decreased knowledge of use of DME, Decreased safety awareness, Decreased range of motion, Decreased mobility, Decreased strength, Difficulty walking, Hypomobility, Increased fascial restricitons, Impaired flexibility, Impaired perceived functional ability, Impaired sensation, Impaired tone, Impaired UE functional use, Postural dysfunction, Improper body mechanics, Pain, Obesity  Visit Diagnosis: Muscle weakness (generalized)  Other lack of coordination  Other abnormalities of gait and mobility  Facial weakness     Problem List Patient Active Problem List   Diagnosis Date Noted  . Collagen vascular disease ([place] 05/21/2017  . Gangrene of finger of right hand ([place] 05/21/2017    MAlanson Puls PVirginiaDPT 09/10/2019, 8:36 AM  [place] [facility] [address] [address]Phone: [phone]  Fax:  [phone] Name: SELENNA SPRATLINGMRN: [id number] of Birth: [birth year]

## 2019-09-10 NOTE — Therapy (Signed)
[facility] MAIN [facility] SERVICES [address] [place], [place], [address] Phone: [phone]   Fax:  [phone]  Occupational Therapy Treatment  Patient Details  Name: Ariel Alvarez MRN: [id number] Date of Birth: May 03, 1946 No data recorded  Encounter Date: 09/10/2019  OT End of Session - 09/10/19 0938    Visit Number  19    Number of Visits  24    Date for OT Re-Evaluation  11/17/19    Authorization Type  Progress report period starting 917/2020    OT Start Time  0845    OT Stop Time  0930    OT Time Calculation (min)  45 min    Equipment Utilized During Treatment  LUE ROM, self-ROM, RUE grip strength, and coordination    Activity Tolerance  Patient tolerated treatment well    Behavior During Therapy  WFL for tasks assessed/performed       Past Medical History:  Diagnosis Date  . Anxiety   . Collagen vascular disease (HCC)   . Depression   . Stroke [facility])     Past Surgical History:  Procedure Laterality Date  . CHOLECYSTECTOMY      There were no vitals filed for this visit.  Subjective Assessment - 09/10/19 0937    Subjective   Pt. reports no pain today    Patient is accompanied by:  Family member    Pertinent History  Pt. is a 73 y.o. female who sustained multiple CVAs with left hemiplegia, Pt. PMHx includes: Depression, Collagen Vascular Disease, Anxiety. Pt. recieved therapy services in thre past. Pt. resides at home with her husband who is supportive, and assists with her care. Pt. is alone during the day, as her husband works.    Patient Stated Goals  To be stronger, be able to take care of myself while my husband works.    Currently in Pain?  No/denies      OT TREATMENT    Neuro muscular re-education:  Pt. worked on Right [facility] skills grasping, and manipulating items needed to make a greeting card. Pt. required assist to fold, and crease the card, unstick the stickers from the package surface, and place the stickers.  Pt. worked on Diplomatic Services operational officer in the greeting card using pen with a large built-up handle. Pt. had difficulty with spacing when writing in the small space of the notecard.   Therapeutic Exercise:  Pt. Tolerated PROM in all joint ranges of the LUE, and handwith emphasis placed on slow gentle prolonged stretching of the hand, wrist, and digits.Pt. education was provided about self-ROM. Pt. Reports having difficulty with digit extension secondary to flexor tightness. Pt. education was provided  About LUE positioning during ROM.  Response to Treatment  Pt. continues to refer to her LUE as "she", and "her". pt. conitnues to present with increased flexor tone, and tightness in the LUE, and hand. Pt. requires increased cues, and hand over hand assist for self-ROM. Pt. continues to work on improving RUE strength, and [facility] skills, and increasing PROM throughout the LUE, and hand in order to improve, and increase engagement during ADls, and IADL tasks.                     OT Education - 09/10/19 [id number]    Education Details  ROM    [name]  Patient    Methods  Explanation;Demonstration;Verbal cues    Comprehension  Verbal cues required;Returned demonstration;Verbalized understanding  OT Long Term Goals - 09/01/19 0939      OT LONG TERM GOAL #1   Title  Pt. will improve RUE strength to be be able to reach into cabinetry to put items away without compensating posteriorly with her trunk.    Baseline  Pt. continues to lean posteriorly when reaching with her RUE    Time  12    Period  Weeks    Status  On-going    Target Date  11/17/19      OT LONG TERM GOAL #2   Title  Pt. will independently complete light meal preparation in standing    Baseline  Pt. continues to have difficulty    Time  12    Period  Weeks    Status  On-going    Target Date  11/17/19      OT LONG TERM GOAL #3   Title  Pt. will improve right hand [facility] skills to be able to independently apply  hook earrings.    Baseline  Pt. continues to be unable to place earrings.    Time  12    Period  Weeks    Status  On-going    Target Date  11/17/19      OT LONG TERM GOAL #4   Title  Pt. will improve right grip strength to be able to hold a can at the can opener    Baseline  Pt. continues to be unable to hold, and use a can opener    Time  12    Period  Weeks    Status  New    Target Date  11/17/19      OT LONG TERM GOAL #5   Title  Pt. will require supervision to sweep the floor.    Baseline  Pt. is unable    Time  12    Period  Weeks    Status  On-going    Target Date  11/17/19            Plan - 09/10/19 6568    Clinical Impression Statement  Pt. continues to refer to her LUE as "she", and "her". pt. conitnues to present with increased flexor tone, and tightness in the LUE, and hand. Pt. requires increased cues, and hand over hand assist for self-ROM. Pt. continues to work on improving RUE strength, and [facility] skills, and increasing PROM throughout the LUE, and hand in order to improve, and increase engagement during ADls, and IADL tasks.    OT Occupational Profile and History  Comprehensive Assessment- Review of records and extensive additional review of physical, cognitive, psychosocial history related to current functional performance    Occupational performance deficits (Please refer to evaluation for details):  ADL's;IADL's    Body Structure / Function / Physical Skills  ADL;IADL;FMC;ROM;Pain;UE functional use;Strength;Dexterity    Rehab Potential  Good    Clinical Decision Making  Several treatment options, min-mod task modification necessary    Comorbidities Affecting Occupational Performance:  Presence of comorbidities impacting occupational performance    Comorbidities impacting occupational performance description:  histroy of strokes, obesity, decreased balance    Modification or Assistance to Complete Evaluation   Min-Moderate modification of tasks or assist with  assess necessary to complete eval    OT Frequency  2x / week    OT Duration  12 weeks    OT Treatment/Interventions  Self-care/ADL training;Neuromuscular education;Manual Therapy;Therapeutic activities;Therapeutic exercise;Patient/family education    Consulted and Agree with Plan of Care  Patient  Patient will benefit from skilled therapeutic intervention in order to improve the following deficits and impairments:   Body Structure / Function / Physical Skills: ADL, IADL, FMC, ROM, Pain, UE functional use, Strength, Dexterity       Visit Diagnosis: Muscle weakness (generalized)  Other lack of coordination    Problem List Patient Active Problem List   Diagnosis Date Noted  . Collagen vascular disease (HCC) 05/21/2017  . 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 [place], [place], [address] Phone: [phone]   Fax:  [phone]  Name: Ariel Alvarez MRN: [id number] Date of Birth: [birth year]

## 2019-09-15 ENCOUNTER — Ambulatory Visit: Payer: Medicare Other | Admitting: Occupational Therapy

## 2019-09-15 ENCOUNTER — Encounter: Payer: Self-pay | Admitting: Occupational Therapy

## 2019-09-15 ENCOUNTER — Ambulatory Visit: Payer: Medicare Other | Admitting: Physical Therapy

## 2019-09-15 ENCOUNTER — Other Ambulatory Visit: Payer: Self-pay

## 2019-09-15 ENCOUNTER — Encounter: Payer: Self-pay | Admitting: Physical Therapy

## 2019-09-15 DIAGNOSIS — R2689 Other abnormalities of gait and mobility: Secondary | ICD-10-CM

## 2019-09-15 DIAGNOSIS — M6281 Muscle weakness (generalized): Secondary | ICD-10-CM

## 2019-09-15 DIAGNOSIS — R278 Other lack of coordination: Secondary | ICD-10-CM

## 2019-09-15 NOTE — Therapy (Signed)
[facility] MAIN [facility] SERVICES [address] [place], [place], [address] Phone: [phone]   Fax:  [phone]  Occupational Therapy Treatment  Patient Details  Name: Ariel Alvarez MRN: [id number] Date of Birth: Oct 02, 1946 No data recorded  Encounter Date: 09/15/2019  OT End of Session - 09/15/19 1209    Visit Number  20    Number of Visits  24    Date for OT Re-Evaluation  11/17/19    Authorization Type  Progress report period starting 917/2020    OT Start Time  0830    OT Stop Time  0915    OT Time Calculation (min)  45 min    Equipment Utilized During Treatment  LUE ROM, self-ROM, RUE grip strength, and coordination    Activity Tolerance  Patient tolerated treatment well    Behavior During Therapy  WFL for tasks assessed/performed       Past Medical History:  Diagnosis Date  . Anxiety   . Collagen vascular disease (HCC)   . Depression   . Stroke [facility])     Past Surgical History:  Procedure Laterality Date  . CHOLECYSTECTOMY      There were no vitals filed for this visit.  Subjective Assessment - 09/15/19 1208    Subjective   Pt. reports no pain today    Patient is accompanied by:  Family member    Pertinent History  Pt. is a 72 y.o. female who sustained multiple CVAs with left hemiplegia, Pt. PMHx includes: Depression, Collagen Vascular Disease, Anxiety. Pt. recieved therapy services in thre past. Pt. resides at home with her husband who is supportive, and assists with her care. Pt. is alone during the day, as her husband works.    Currently in Pain?  No/denies      Therapeutic Exercise:  Pt. Tolerated PROM in all joint ranges of the LUE, and handwith emphasis placed on slow gentle prolonged stretching of the hand, wrist, and digits.[email or web address] was provided about self-ROM. Pt. Reports having difficulty with digit extension secondary to flexor tightness. Pt. education was provided  About LUE positioning during  ROM. Pt. Requires additional practice for self-ROM to reinforce carry-over.  Neuromuscular re-ed:  Pt. worked on grasping, and manipulating 1", 3/4", and 1/2"  washers from a magnetic dish using point grasp pattern. Pt. worked on reaching up, stabilizing, and sustaining shoulder elevation while placing the washer over a small precise target on vertical dowels positioned at various angles.    Response to Treatment:  Pt. continues to present with increased flexor tone, and tightness in the LUE, and hand. Pt. requires increased cues, and hand over hand assist for self-ROM. Pt. continues to have difficulty with opening containers, applying earrings, and reaching into cabinetry.  Pt. continues to work on improving RUE strength, and [facility] skills, and increasing PROM throughout the LUE, and hand in order to improve, and increase engagement during ADLs, and IADL tasks.                   OT Education - 09/15/19 1209    Education Details  ROM    [name]  Patient    Methods  Explanation;Demonstration;Verbal cues    Comprehension  Verbal cues required;Returned demonstration;Verbalized understanding          OT Long Term Goals - 09/15/19 1211      OT LONG TERM GOAL #1   Title  Pt. will improve RUE strength to be be able to reach into cabinetry  to put items away without compensating posteriorly with her trunk.    Baseline  Pt. continues to lean posteriorly when reaching with her RUE    Time  12    Period  Weeks    Status  On-going    Target Date  11/17/19      OT LONG TERM GOAL #2   Title  Pt. will independently complete light meal preparation in standing    Baseline  Pt. continues to have difficulty    Time  12    Period  Weeks    Status  On-going    Target Date  11/17/19      OT LONG TERM GOAL #3   Title  Pt. will improve right hand [place] skills to be able to independently apply hook earrings.    Baseline  Pt is progressing, however continues to be unable to  place earrings.    Time  12    Period  Weeks    Status  On-going    Target Date  11/17/19      OT LONG TERM GOAL #4   Title  Pt. will improve right grip strength to be able to hold a can at the can opener    Baseline  Pt. continues to be unable to hold, and use a can opener    Time  12    Period  Weeks    Status  On-going    Target Date  11/17/19      OT LONG TERM GOAL #5   Title  Pt. will require supervision to sweep the floor.    Baseline  Pt. continues to have difficulty    Time  12    Period  Weeks    Status  On-going    Target Date  11/17/19            Plan - 09/15/19 1209    Clinical Impression Statement  Pt. continues to present with increased flexor tone, and tightness in the LUE, and hand. Pt. requires increased cues, and hand over hand assist for self-ROM. Pt. continues to have difficulty with opening containers, applying earrings, and reaching into cabinetry.  Pt. continues to work on improving RUE strength, and [facility] skills, and increasing PROM throughout the LUE, and hand in order to improve, and increase engagement during ADLs, and IADL tasks.    OT Occupational Profile and History  Comprehensive Assessment- Review of records and extensive additional review of physical, cognitive, psychosocial history related to current functional performance    Occupational performance deficits (Please refer to evaluation for details):  ADL's;IADL's    Body Structure / Function / Physical Skills  ADL;IADL;FMC;ROM;Pain;UE functional use;Strength;Dexterity    Rehab Potential  Good    Clinical Decision Making  Several treatment options, min-mod task modification necessary    Comorbidities Affecting Occupational Performance:  Presence of comorbidities impacting occupational performance    Modification or Assistance to Complete Evaluation   Min-Moderate modification of tasks or assist with assess necessary to complete eval    OT Frequency  2x / week    OT Duration  12 weeks    OT  Treatment/Interventions  Self-care/ADL training;Neuromuscular education;Manual Therapy;Therapeutic activities;Therapeutic exercise;Patient/family education    Consulted and Agree with Plan of Care  Patient       Patient will benefit from skilled therapeutic intervention in order to improve the following deficits and impairments:   Body Structure / Function / Physical Skills: ADL, IADL, FMC, ROM, Pain, UE functional use, Strength,  Dexterity       Visit Diagnosis: Muscle weakness (generalized)  Other lack of coordination    Problem List Patient Active Problem List   Diagnosis Date Noted  . Collagen vascular disease (HCC) 05/21/2017  . 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

## 2019-09-15 NOTE — Therapy (Signed)
[place] MAIN [facility] SERVICES [address] [place], [place], [address] Phone: [phone]   Fax:  [phone]  Physical Therapy Treatment  Patient Details  Name: Ariel Alvarez MRN: [id number] Date of Birth: 1946/03/19 Referring Provider (PT): Okey Regal   Encounter Date: 09/15/2019  PT End of Session - 09/15/19 0943    Visit Number  32    Number of Visits  49    Date for PT Re-Evaluation  10/27/19    Authorization Type  09/08/19 beginning of progress report    PT Start Time  0915    PT Stop Time  1000    PT Time Calculation (min)  45 min    Equipment Utilized During Treatment  Gait belt    Activity Tolerance  Patient limited by fatigue;Patient limited by pain    Behavior During Therapy  [facility] for tasks assessed/performed       Past Medical History:  Diagnosis Date  . Anxiety   . Collagen vascular disease ([place])   . Depression   . Stroke [facility])     Past Surgical History:  Procedure Laterality Date  . CHOLECYSTECTOMY      There were no vitals filed for this visit.  Subjective Assessment - 09/15/19 0912    Subjective  Patient reports that she is better. no new concerns.she reports that after the stretching and rolling treatment last treatment, she felt so much better.    Patient is accompained by:  Family member    Pertinent History  Patient presents for instability s/p CVA in 01/2017. Patient was recently diagnosed with osteoporosis in bilateral legs and feet. Is potentially getting surgery in 2 weeks on both feet. After discharge from outpatient physical therapy had one session with home health PT but no other therapy. Reports no falls. Reports walking from living room to kitchen and back to walkway however her LLE gives out frequently . Per husband and patient she was going to an adult day center prior to [place]. Patient has a flat/level entry. Trying to get a power chair at this time from [employer]. Has a  manual chair at this time, walker, hemi walker, quad cane. Doing bird baths but has a walk in tub she can't get in, does have a shower chair. Patient and husband aware that patient will need caregiver with her due to toileting needs.    Limitations  Sitting;Lifting;Standing;Walking;House hold activities    How long can you sit comfortably?  recliner 5 hours, 30 minute sin wheelchair    How long can you stand comfortably?  4 minutes with UE support    How long can you walk comfortably?  15 ft    Patient Stated Goals  to walk better    Pain Onset  Yesterday         Therapeutic exercise: Nu-step x 5 mins L 4  Supine: SLR x 15 BLE, assist for LLE Hookling marching x 15 , assist for correct technique LLE Hooklying abd/ER x 15  Bridging x 15, LLE is depressed SAQ x 15 BLE Hip abd/add x 15, BLE, Assisted LLE Heel slides x 15, BLE, assisted LLE PROM to RLE ankle with heel cord stretch Roller stick to R calf x 5 mins to reduce tone and tightness  Gait training with hemiwalker 50 feet, CGA  x 4 with wc following - improved step length and height  Patient performed with instruction, verbal cues, tactile cues of therapist: goal:increase tissue extensibility, promote proper posture,  improve mobility                       PT Education - 09/15/19 0942    Education Details  HEP    [name]  Patient    Methods  Explanation    Comprehension  Verbalized understanding;Returned demonstration;Need further instruction       PT Short Term Goals - 05/21/19 1446      PT SHORT TERM GOAL #1   Title  Patient will perform one sit to stand independently from wheelchair to increase independence with transfers    Baseline  6/17: min/mod A    Time  2    Period  Weeks    Status  Partially Met    Target Date  04/29/19      PT SHORT TERM GOAL #2   Title  Patient will be independent in home exercise program to improve strength/mobility for better functional independence with  ADLs.    Baseline  HEP to be given next session    Time  2    Status  Partially Met    Target Date  04/29/19        PT Long Term Goals - 09/08/19 0954      PT LONG TERM GOAL #1   Title  Patient will perform 5 STS < 40 seconds independently with hemi walker to demonstrate improved home mobility.    Baseline  6/17: unable to perform one time without Min/Mod A, 05/21/19 1.06 sec: 07/02/19=56 sec,09/08/19= 52 sec    Time  12    Period  Weeks    Status  On-going    Target Date  10/27/19      PT LONG TERM GOAL #2   Title  Patient will stand without UE support for 3-5 minutes for ADL performance without LOB to increase independence with dressing and toileting.    Baseline  6/17: unable to stand without UE support, 05/21/19 1 min 30 sec:07/02/19= 3 min, 09/08/19 3 1/2 mins    Time  12    Period  Weeks    Status  On-going    Target Date  10/27/19      PT LONG TERM GOAL #3   Title  Patient will ambulate 60 ft with hemi walker and CGA to improve home mobility and independence with gait.     Baseline  6/17: 10 ft, 05/21/19 17 feet: 07/02/19 = 50 feet, 09/08/19 50 feet    Time  12    Period  Weeks    Status  On-going    Target Date  10/27/19      PT LONG TERM GOAL #4   Title  Patient will increase BLE gross strength to 4+/5 as to improve functional strength for independent gait, increased standing tolerance and increased ADL ability    Baseline  6/17: hips grossly 2+/5 knees 3+/5:     07/02/19: R hip  flex -3/5, L hip flex 2/5 knees 3+/5,     11/10//20: R hip  flex -3/5, L hip flex 2/5 knees 3+/5    Time  12    Period  Weeks    Status  On-going    Target Date  10/27/19      PT LONG TERM GOAL #5   Title  Patient will increase lower extremity functional scale to >30/80 to demonstrate improved functional mobility and increased tolerance with ADLs.     Baseline  09/08/19 = 28/80    Time  12    Period  Weeks    Status  New    Target Date  10/27/19            Plan - 09/15/19 0943     Clinical Impression Statement  Patient   instructed in transfers with rest periods standing. Patient performs intermediate LE exercises in supine and sitting open and closed chain. Patient will continue to benefit from skilled PT to improve strength, balance and gait. Patient continues to have LE weakness and decreased static and dynamic standing balance.   Personal Factors and Comorbidities  Behavior Pattern;Comorbidity 3+;Fitness;Past/Current Experience;Social Background;Time since onset of injury/illness/exacerbation;Transportation    Comorbidities  anxiety, collagen vascular disease, depression, stroke, amputated digit    Examination-Activity Limitations  Bathing;Bed Mobility;Bend;Caring for Others;Carry;Continence;Dressing;Sleep;Sit;Reach Overhead;Locomotion Level;Lift;Hygiene/Grooming;Squat;Stairs;Stand;Toileting;Transfers    Examination-Participation Restrictions  Church;Cleaning;Community Activity;Driving;Interpersonal Relationship;Meal Prep;Medication Management;Laundry;Shop;Volunteer;Yard Work    [employer]  Evolving/Moderate complexity    Rehab Potential  Fair    PT Frequency  2x / week    PT Treatment/Interventions  ADLs/Self Care Home Management;Aquatic Therapy;Cryotherapy;Biofeedback;Electrical Stimulation;Iontophoresis 28m/ml Dexamethasone;Moist Heat;Traction;Ultrasound;Functional mobility training;Stair training;Gait training;DME Instruction;Therapeutic activities;Therapeutic exercise;Balance training;Neuromuscular re-education;Orthotic Fit/Training;Patient/family education;Cognitive remediation;Wheelchair mobility training;Manual techniques;Taping;Splinting;Energy conservation    PT Next Visit Plan  give HEP, practice SPT, ambulation, COM    PT Home Exercise Plan  Reviewed LAQs and alt hip flexion with pt today; pt able to verbalize and demonstrate understanding       Patient will benefit from skilled therapeutic intervention in order to improve the following  deficits and impairments:  Abnormal gait, Decreased activity tolerance, Decreased coordination, Decreased balance, Decreased endurance, Decreased knowledge of precautions, Decreased knowledge of use of DME, Decreased safety awareness, Decreased range of motion, Decreased mobility, Decreased strength, Difficulty walking, Hypomobility, Increased fascial restricitons, Impaired flexibility, Impaired perceived functional ability, Impaired sensation, Impaired tone, Impaired UE functional use, Postural dysfunction, Improper body mechanics, Pain, Obesity  Visit Diagnosis: Muscle weakness (generalized)  Other lack of coordination  Other abnormalities of gait and mobility     Problem List Patient Active Problem List   Diagnosis Date Noted  . Collagen vascular disease ([place] 05/21/2017  . [place] of right [place] ([place] 05/21/2017    MAlanson Puls PT DPT 09/15/2019, 10:18 AM  [place] [facility]SERV[facility] [address]RP[address] [place] [address]Phone: [phone]  Fax:  [phone] Name: Ariel MIGUEZMRN: [id number] of Birth: [birth year]

## 2019-09-17 ENCOUNTER — Other Ambulatory Visit: Payer: Self-pay

## 2019-09-17 ENCOUNTER — Ambulatory Visit: Payer: Medicare Other | Admitting: Physical Therapy

## 2019-09-17 ENCOUNTER — Ambulatory Visit: Payer: Medicare Other | Admitting: Occupational Therapy

## 2019-09-17 ENCOUNTER — Encounter: Payer: Self-pay | Admitting: Physical Therapy

## 2019-09-17 ENCOUNTER — Encounter: Payer: Self-pay | Admitting: Occupational Therapy

## 2019-09-17 DIAGNOSIS — R29810 Facial weakness: Secondary | ICD-10-CM

## 2019-09-17 DIAGNOSIS — R278 Other lack of coordination: Secondary | ICD-10-CM

## 2019-09-17 DIAGNOSIS — R2689 Other abnormalities of gait and mobility: Secondary | ICD-10-CM

## 2019-09-17 DIAGNOSIS — M6281 Muscle weakness (generalized): Secondary | ICD-10-CM

## 2019-09-17 NOTE — Therapy (Signed)
[place] MAIN [facility] SERVICES [address] [place], [place], [address] Phone: [phone]   Fax:  [phone]  Physical Therapy Treatment  Patient Details  Name: Ariel Alvarez MRN: [id number] Date of Birth: Mar 01, 1946 Referring Provider (PT): Okey Regal   Encounter Date: 09/17/2019  PT End of Session - 09/17/19 0834    Visit Number  33    Number of Visits  66    Date for PT Re-Evaluation  10/27/19    Authorization Type  09/08/19 beginning of progress report    PT Start Time  0800    PT Stop Time  0840    PT Time Calculation (min)  40 min    Equipment Utilized During Treatment  Gait belt    Activity Tolerance  Patient limited by fatigue;Patient limited by pain    Behavior During Therapy  [facility] for tasks assessed/performed       Past Medical History:  Diagnosis Date  . Anxiety   . Collagen vascular disease ([place])   . Depression   . Stroke [facility])     Past Surgical History:  Procedure Laterality Date  . CHOLECYSTECTOMY      There were no vitals filed for this visit.  Subjective Assessment - 09/17/19 0833    Subjective  Patient reports that she is better. no new concerns.she reports that after the stretching and rolling treatment last treatment, she felt so much better.    Patient is accompained by:  Family member    Pertinent History  Patient presents for instability s/p CVA in 01/2017. Patient was recently diagnosed with osteoporosis in bilateral legs and feet. Is potentially getting surgery in 2 weeks on both feet. After discharge from outpatient physical therapy had one session with home health PT but no other therapy. Reports no falls. Reports walking from living room to kitchen and back to walkway however her LLE gives out frequently . Per husband and patient she was going to an adult day center prior to [place]. Patient has a flat/level entry. Trying to get a power chair at this time from [employer]. Has a  manual chair at this time, walker, hemi walker, quad cane. Doing bird baths but has a walk in tub she can't get in, does have a shower chair. Patient and husband aware that patient will need caregiver with her due to toileting needs.    Limitations  Sitting;Lifting;Standing;Walking;House hold activities    How long can you sit comfortably?  recliner 5 hours, 30 minute sin wheelchair    How long can you stand comfortably?  4 minutes with UE support    How long can you walk comfortably?  15 ft    Patient Stated Goals  to walk better    Currently in Pain?  Yes    Pain Score  3     Pain Location  Hip    Pain Orientation  Left    Pain Descriptors / Indicators  Aching    Pain Type  Acute pain    Pain Onset  Yesterday    Pain Frequency  Intermittent    Aggravating Factors   exercise    Pain Relieving Factors  rest    Effect of Pain on Daily Activities  difficult to walk       Therapeutic exercise: Nu-stepx 5 mins L 4  Supine: SLR x 15 BLE, assist for LLE Hookling marching x 15, assist for correct technique LLE Hooklying abd/ER x 15  Bridging x 15,  LLE is depressed SAQ x 15 BLE Hip abd/add x 15, BLE, Assisted LLE Heel slides x 15, BLE, assisted LLE PROM to RLE ankle with heel cord stretch Roller stick to R calf x 5 mins to reduce tone and tightness  Gait training with hemiwalker 50 feet, CGA  x 4 with wc following- improved step length and height  Patient performed with instruction, verbal cues, tactile cues of therapist: goal:increase tissue extensibility, promote proper posture, improve mobility                        PT Education - 09/17/19 0834    Education Details  HEP    [name]  Patient    Methods  Explanation    Comprehension  Verbalized understanding       PT Short Term Goals - 05/21/19 1446      PT SHORT TERM GOAL #1   Title  Patient will perform one sit to stand independently from wheelchair to increase independence with  transfers    Baseline  6/17: min/mod A    Time  2    Period  Weeks    Status  Partially Met    Target Date  04/29/19      PT SHORT TERM GOAL #2   Title  Patient will be independent in home exercise program to improve strength/mobility for better functional independence with ADLs.    Baseline  HEP to be given next session    Time  2    Status  Partially Met    Target Date  04/29/19        PT Long Term Goals - 09/08/19 0954      PT LONG TERM GOAL #1   Title  Patient will perform 5 STS < 40 seconds independently with hemi walker to demonstrate improved home mobility.    Baseline  6/17: unable to perform one time without Min/Mod A, 05/21/19 1.06 sec: 07/02/19=56 sec,09/08/19= 52 sec    Time  12    Period  Weeks    Status  On-going    Target Date  10/27/19      PT LONG TERM GOAL #2   Title  Patient will stand without UE support for 3-5 minutes for ADL performance without LOB to increase independence with dressing and toileting.    Baseline  6/17: unable to stand without UE support, 05/21/19 1 min 30 sec:07/02/19= 3 min, 09/08/19 3 1/2 mins    Time  12    Period  Weeks    Status  On-going    Target Date  10/27/19      PT LONG TERM GOAL #3   Title  Patient will ambulate 60 ft with hemi walker and CGA to improve home mobility and independence with gait.     Baseline  6/17: 10 ft, 05/21/19 17 feet: 07/02/19 = 50 feet, 09/08/19 50 feet    Time  12    Period  Weeks    Status  On-going    Target Date  10/27/19      PT LONG TERM GOAL #4   Title  Patient will increase BLE gross strength to 4+/5 as to improve functional strength for independent gait, increased standing tolerance and increased ADL ability    Baseline  6/17: hips grossly 2+/5 knees 3+/5:     07/02/19: R hip  flex -3/5, L hip flex 2/5 knees 3+/5,     11/10//20: R hip  flex -3/5, L hip  flex 2/5 knees 3+/5    Time  12    Period  Weeks    Status  On-going    Target Date  10/27/19      PT LONG TERM GOAL #5   Title  Patient will  increase lower extremity functional scale to >30/80 to demonstrate improved functional mobility and increased tolerance with ADLs.     Baseline  09/08/19 = 28/80    Time  12    Period  Weeks    Status  New    Target Date  10/27/19            Plan - 09/17/19 4827    Clinical Impression Statement  Patient   instructed in transfers with rest periods standing and gait training with hemi walker. Patient performs intermediate LE exercises in supine and sitting open and closed chain. Patient will continue to benefit from skilled PT to improve strength, balance and gait. Patient continues to have LE weakness and decreased static and dynamic standing balance.   Personal Factors and Comorbidities  Behavior Pattern;Comorbidity 3+;Fitness;Past/Current Experience;Social Background;Time since onset of injury/illness/exacerbation;Transportation    Comorbidities  anxiety, collagen vascular disease, depression, stroke, amputated digit    Examination-Activity Limitations  Bathing;Bed Mobility;Bend;Caring for Others;Carry;Continence;Dressing;Sleep;Sit;Reach Overhead;Locomotion Level;Lift;Hygiene/Grooming;Squat;Stairs;Stand;Toileting;Transfers    Examination-Participation Restrictions  Church;Cleaning;Community Activity;Driving;Interpersonal Relationship;Meal Prep;Medication Management;Laundry;Shop;Volunteer;Yard Work    [employer]  Evolving/Moderate complexity    Rehab Potential  Fair    PT Frequency  2x / week    PT Treatment/Interventions  ADLs/Self Care Home Management;Aquatic Therapy;Cryotherapy;Biofeedback;Electrical Stimulation;Iontophoresis 54m/ml Dexamethasone;Moist Heat;Traction;Ultrasound;Functional mobility training;Stair training;Gait training;DME Instruction;Therapeutic activities;Therapeutic exercise;Balance training;Neuromuscular re-education;Orthotic Fit/Training;Patient/family education;Cognitive remediation;Wheelchair mobility training;Manual  techniques;Taping;Splinting;Energy conservation    PT Next Visit Plan  give HEP, practice SPT, ambulation, COM    PT Home Exercise Plan  Reviewed LAQs and alt hip flexion with pt today; pt able to verbalize and demonstrate understanding       Patient will benefit from skilled therapeutic intervention in order to improve the following deficits and impairments:  Abnormal gait, Decreased activity tolerance, Decreased coordination, Decreased balance, Decreased endurance, Decreased knowledge of precautions, Decreased knowledge of use of DME, Decreased safety awareness, Decreased range of motion, Decreased mobility, Decreased strength, Difficulty walking, Hypomobility, Increased fascial restricitons, Impaired flexibility, Impaired perceived functional ability, Impaired sensation, Impaired tone, Impaired UE functional use, Postural dysfunction, Improper body mechanics, Pain, Obesity  Visit Diagnosis: Muscle weakness (generalized)  Other lack of coordination  Other abnormalities of gait and mobility  Facial weakness     Problem List Patient Active Problem List   Diagnosis Date Noted  . [place] 05/[place]  . Gangrene of finger of right hand ([place] 05/21/2017    MAlanson Puls PT DPT 09/17/2019, 9:01 AM  [place] [address] [place] [place] [address]Phone: [phone]  Fax:  [phone] Name: Ariel SKARDAMRN: [id number] of Birth: [birth year]

## 2019-09-17 NOTE — Therapy (Signed)
[place] MAIN [facility] SERVICES [address] [place], [place], [address] Phone: [phone]   Fax:  [phone]  Occupational Therapy Treatment  Patient Details  Name: Ariel Alvarez MRN: [id number] Date of Birth: [birth year] No data recorded  Encounter Date: 09/17/2019  OT End of Session - 09/17/19 1443    Visit Number  21    Number of Visits  24    Date for OT Re-Evaluation  11/17/19    Authorization Type  Progress report period starting 09/17/2019    OT Start Time  0845    OT Stop Time  0915    OT Time Calculation (min)  30 min    Equipment Utilized During Treatment  LUE ROM, self-ROM, RUE grip strength, and coordination    Activity Tolerance  Patient tolerated treatment well    Behavior During Therapy  WFL for tasks assessed/performed       Past Medical History:  Diagnosis Date  . Anxiety   . Collagen vascular disease ([place])   . Depression   . Stroke [facility])     Past Surgical History:  Procedure Laterality Date  . CHOLECYSTECTOMY      There were no vitals filed for this visit.  Subjective Assessment - 09/17/19 1443    Subjective   Pt. reports no pain today    Patient is accompanied by:  Family member    Pertinent History  Pt. is a 73 y.o. female who sustained multiple CVAs with left hemiplegia, Pt. PMHx includes: Depression, Collagen Vascular Disease, Anxiety. Pt. recieved therapy services in thre past. Pt. resides at home with her husband who is supportive, and assists with her care. Pt. is alone during the day, as her husband works.    Currently in Pain?  No/denies      OT TREATMENT    Neuro muscular re-education:  Pt. worked on tasks to sustain lateral pinch on resistive tweezers while grasping and moving 2" toothpick sticks from a horizontal flat position to a vertical position in order to place it in the holder. Pt. Requires consistent verbal cues,visual cues, and cues for the grasp pattern, tweezer position, and  overall movement pattern. The tweezers were exchanged to a red resistive clip with improved results.  Therapeutic Exercise:  Pt. Tolerated PROM in all joint ranges of the LUE, and handwith emphasis placed on slow gentle prolonged stretching of the hand, wrist, and digits.[email or web address] was provided about self-ROM. Pt. Reports having difficulty with digit extension secondary to flexor tightness. Pt. education was providedAboutLUE positioning during ROM. Pt. Requires additional practice for self-ROM to reinforce carry-over.  Response to Treatment:  Pt. initially did not have a scheduled OT session today, however was seen per pt. request secondary to having a PT session. Pt. continues to present with flexor tone, tightness, and slpasticity in the left wrist, and digits. Pt. presented with less tone tone after ROM, during self-ROM. Pt. requires verbal cues, tactile cues, and cues for visual demonstration for proprer self-ROM technique. Pt. continues to work on improving LUE ROM, and right hand function in order to increase bilateral UE engagement in daily ADL, and IADL task. Pt. Continues to work on these skills to improve, and promote independence in ADLs, and IADL tasks.                         OT Education - 09/17/19 1443    Education Details  ROM    [name]  Patient  Methods  Explanation;Demonstration;Verbal cues    Comprehension  Verbal cues required;Returned demonstration;Verbalized understanding          OT Long Term Goals - 09/15/19 1211      OT LONG TERM GOAL #1   Title  Pt. will improve RUE strength to be be able to reach into cabinetry to put items away without compensating posteriorly with her trunk.    Baseline  Pt. continues to lean posteriorly when reaching with her RUE    Time  12    Period  Weeks    Status  On-going    Target Date  11/17/19      OT LONG TERM GOAL #2   Title  Pt. will independently complete light meal preparation  in standing    Baseline  Pt. continues to have difficulty    Time  12    Period  Weeks    Status  On-going    Target Date  11/17/19      OT LONG TERM GOAL #3   Title  Pt. will improve right hand [facility] skills to be able to independently apply hook earrings.    Baseline  Pt is progressing, however continues to be unable to place earrings.    Time  12    Period  Weeks    Status  On-going    Target Date  11/17/19      OT LONG TERM GOAL #4   Title  Pt. will improve right grip strength to be able to hold a can at the can opener    Baseline  Pt. continues to be unable to hold, and use a can opener    Time  12    Period  Weeks    Status  On-going    Target Date  11/17/19      OT LONG TERM GOAL #5   Title  Pt. will require supervision to sweep the floor.    Baseline  Pt. continues to have difficulty    Time  12    Period  Weeks    Status  On-going    Target Date  11/17/19            Plan - 09/17/19 1444    Clinical Impression Statement Pt. initially did not have a scheduled OT session today, however was seen per pt. request secondary to having a PT session. Pt. continues to present with flexor tone, tightness, and slpasticity in the left wrist, and digits. Pt. presented with less tone tone after ROM, during self-ROM. Pt. requires verbal cues, tactile cues, and cues for visual demonstration for proprer self-ROM technique. Pt. continues to work on improving LUE ROm, and right hand function in order to increase engagement in daily ADL, and IADL tasks.    OT Occupational Profile and History  Comprehensive Assessment- Review of records and extensive additional review of physical, cognitive, psychosocial history related to current functional performance    Occupational performance deficits (Please refer to evaluation for details):  ADL's;IADL's    Body Structure / Function / Physical Skills  ADL;IADL;FMC;ROM;Pain;UE functional use;Strength;Dexterity    Rehab Potential  Good    Clinical  Decision Making  Several treatment options, min-mod task modification necessary    Comorbidities Affecting Occupational Performance:  Presence of comorbidities impacting occupational performance    Comorbidities impacting occupational performance description:  histroy of strokes, obesity, decreased balance    Modification or Assistance to Complete Evaluation   Min-Moderate modification of tasks or assist with assess necessary  to complete eval    OT Frequency  2x / week    OT Duration  12 weeks    OT Treatment/Interventions  Self-care/ADL training;Neuromuscular education;Manual Therapy;Therapeutic activities;Therapeutic exercise;Patient/family education    OT Home Exercise Plan  Self ROM exercises tabletop    Consulted and Agree with Plan of Care  Patient       Patient will benefit from skilled therapeutic intervention in order to improve the following deficits and impairments:   Body Structure / Function / Physical Skills: ADL, IADL, FMC, ROM, Pain, UE functional use, Strength, Dexterity       Visit Diagnosis: Muscle weakness (generalized)  Other lack of coordination    Problem List Patient Active Problem List   Diagnosis Date Noted  . Collagen vascular disease ([facility]) 05/21/2017  . 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) 05/21/2017    Olegario Messier, MS, OTR/L 09/17/2019, 2:51 PM  [facility] MAIN [facility] SERVICES [address] [place], [place], [address] Phone: [phone]   Fax:  [phone]  Name: Ariel Alvarez MRN: [id number] Date of Birth: 14-Sep-1946

## 2019-09-22 ENCOUNTER — Ambulatory Visit: Payer: Medicare Other | Admitting: Occupational Therapy

## 2019-09-22 ENCOUNTER — Ambulatory Visit: Payer: Medicare Other | Admitting: Physical Therapy

## 2019-09-28 DIAGNOSIS — K5909 Other constipation: Secondary | ICD-10-CM | POA: Insufficient documentation

## 2019-09-28 DIAGNOSIS — R1310 Dysphagia, unspecified: Secondary | ICD-10-CM | POA: Insufficient documentation

## 2019-09-29 ENCOUNTER — Ambulatory Visit: Payer: Medicare PPO | Admitting: Occupational Therapy

## 2019-09-29 ENCOUNTER — Ambulatory Visit: Payer: Medicare PPO | Admitting: Physical Therapy

## 2019-10-01 ENCOUNTER — Ambulatory Visit: Payer: Medicare PPO | Admitting: Occupational Therapy

## 2019-10-01 ENCOUNTER — Ambulatory Visit: Payer: Medicare PPO | Admitting: Physical Therapy

## 2019-10-06 ENCOUNTER — Ambulatory Visit: Payer: Medicare PPO | Admitting: Occupational Therapy

## 2019-10-06 ENCOUNTER — Ambulatory Visit: Payer: Medicare PPO | Admitting: Physical Therapy

## 2019-10-08 ENCOUNTER — Ambulatory Visit: Payer: Medicare PPO | Admitting: Physical Therapy

## 2019-10-08 ENCOUNTER — Ambulatory Visit: Payer: Medicare PPO | Admitting: Occupational Therapy

## 2019-10-13 ENCOUNTER — Ambulatory Visit: Payer: Medicare PPO | Admitting: Physical Therapy

## 2019-10-13 ENCOUNTER — Ambulatory Visit: Payer: Medicare PPO | Admitting: Occupational Therapy

## 2019-10-15 ENCOUNTER — Ambulatory Visit: Payer: Medicare PPO | Admitting: Physical Therapy

## 2019-10-15 ENCOUNTER — Ambulatory Visit: Payer: Medicare PPO | Admitting: Occupational Therapy

## 2019-10-20 ENCOUNTER — Ambulatory Visit: Payer: Medicare PPO | Admitting: Occupational Therapy

## 2019-10-20 ENCOUNTER — Ambulatory Visit: Payer: Medicare PPO | Admitting: Physical Therapy

## 2019-10-22 ENCOUNTER — Ambulatory Visit: Payer: Medicare PPO | Admitting: Occupational Therapy

## 2019-10-22 ENCOUNTER — Ambulatory Visit: Payer: Medicare PPO | Admitting: Physical Therapy

## 2019-10-27 ENCOUNTER — Ambulatory Visit: Payer: Medicare PPO | Admitting: Occupational Therapy

## 2019-10-27 ENCOUNTER — Ambulatory Visit: Payer: Medicare PPO | Admitting: Physical Therapy

## 2019-10-29 ENCOUNTER — Ambulatory Visit: Payer: Medicare PPO | Admitting: Physical Therapy

## 2019-10-29 ENCOUNTER — Ambulatory Visit: Payer: Medicare PPO | Admitting: Occupational Therapy

## 2019-11-03 ENCOUNTER — Ambulatory Visit: Payer: Medicare PPO | Admitting: Physical Therapy

## 2019-11-03 ENCOUNTER — Ambulatory Visit: Payer: Medicare PPO | Admitting: Occupational Therapy

## 2019-11-05 ENCOUNTER — Ambulatory Visit: Payer: Medicare PPO

## 2019-11-05 ENCOUNTER — Ambulatory Visit: Payer: Medicare PPO | Admitting: Occupational Therapy

## 2019-11-10 ENCOUNTER — Ambulatory Visit: Payer: Medicare PPO | Admitting: Physical Therapy

## 2019-11-10 ENCOUNTER — Ambulatory Visit: Payer: Medicare PPO | Admitting: Occupational Therapy

## 2019-11-12 ENCOUNTER — Ambulatory Visit: Payer: Medicare PPO | Admitting: Physical Therapy

## 2019-11-12 ENCOUNTER — Ambulatory Visit: Payer: Medicare PPO | Admitting: Occupational Therapy

## 2019-11-17 ENCOUNTER — Ambulatory Visit: Payer: Medicare PPO | Admitting: Physical Therapy

## 2019-11-17 ENCOUNTER — Ambulatory Visit: Payer: Medicare PPO | Admitting: Occupational Therapy

## 2019-11-19 ENCOUNTER — Ambulatory Visit: Payer: Medicare PPO | Admitting: Physical Therapy

## 2019-11-19 ENCOUNTER — Ambulatory Visit: Payer: Medicare PPO | Admitting: Occupational Therapy

## 2019-11-24 ENCOUNTER — Ambulatory Visit: Payer: Medicare PPO | Admitting: Physical Therapy

## 2019-11-24 ENCOUNTER — Ambulatory Visit: Payer: Medicare PPO | Admitting: Occupational Therapy

## 2019-11-26 ENCOUNTER — Ambulatory Visit: Payer: Medicare PPO | Admitting: Physical Therapy

## 2019-11-26 ENCOUNTER — Ambulatory Visit: Payer: Medicare PPO | Admitting: Occupational Therapy

## 2019-12-07 ENCOUNTER — Other Ambulatory Visit (INDEPENDENT_AMBULATORY_CARE_PROVIDER_SITE_OTHER): Payer: Self-pay | Admitting: Vascular Surgery

## 2019-12-07 DIAGNOSIS — L97909 Non-pressure chronic ulcer of unspecified part of unspecified lower leg with unspecified severity: Secondary | ICD-10-CM

## 2019-12-08 ENCOUNTER — Encounter (INDEPENDENT_AMBULATORY_CARE_PROVIDER_SITE_OTHER): Payer: Self-pay | Admitting: Vascular Surgery

## 2019-12-08 ENCOUNTER — Other Ambulatory Visit: Payer: Self-pay

## 2019-12-08 ENCOUNTER — Ambulatory Visit (INDEPENDENT_AMBULATORY_CARE_PROVIDER_SITE_OTHER): Payer: Medicare PPO

## 2019-12-08 ENCOUNTER — Ambulatory Visit (INDEPENDENT_AMBULATORY_CARE_PROVIDER_SITE_OTHER): Payer: Medicare PPO | Admitting: Vascular Surgery

## 2019-12-08 VITALS — BP 142/84 | Resp 12 | Ht 63.0 in

## 2019-12-08 DIAGNOSIS — E785 Hyperlipidemia, unspecified: Secondary | ICD-10-CM | POA: Diagnosis not present

## 2019-12-08 DIAGNOSIS — L97909 Non-pressure chronic ulcer of unspecified part of unspecified lower leg with unspecified severity: Secondary | ICD-10-CM | POA: Diagnosis not present

## 2019-12-08 DIAGNOSIS — I96 Gangrene, not elsewhere classified: Secondary | ICD-10-CM

## 2019-12-08 DIAGNOSIS — L97501 Non-pressure chronic ulcer of other part of unspecified foot limited to breakdown of skin: Secondary | ICD-10-CM

## 2019-12-08 DIAGNOSIS — I639 Cerebral infarction, unspecified: Secondary | ICD-10-CM | POA: Diagnosis not present

## 2019-12-08 DIAGNOSIS — M359 Systemic involvement of connective tissue, unspecified: Secondary | ICD-10-CM

## 2019-12-08 NOTE — Assessment & Plan Note (Signed)
Could explain nonhealing ulcerations and wound issues.

## 2019-12-08 NOTE — Progress Notes (Signed)
Patient ID: Ariel Alvarez, female   DOB: 1946/10/16, 74 y.o.   MRN: [id number]  Chief Complaint  Patient presents with   Follow-up    U/S F/U    HPI Ariel Alvarez is a 74 y.o. female.  I am asked to see the patient by Dr. Alberteen Spindle for evaluation of LE ulcerations and to assess her perfusion.  The patient has left hemiplegia after stroke and developed lower extremity ulcerations over the past several weeks.  These are healing since she has seen Dr. Graciela Husbands.  She does not have any pain in the area.  She denies any fever, chills, or signs of systemic infection.  There was no obvious trauma or injury.  I had seen her almost 3 years ago for ulcerations on her hand which ultimately went on to heal.  She does have a previous history of collagen vascular disease in addition to her stroke. Noninvasive studies were performed today to evaluate her adequacy for wound healing.  These demonstrated normal triphasic waveforms and normal ABIs of 1.17 bilaterally as well as normal digital pressures.     Past Medical History:  Diagnosis Date   Anxiety    Collagen vascular disease (HCC)    Depression    Stroke [facility])     Past Surgical History:  Procedure Laterality Date   CHOLECYSTECTOMY       Family History  Problem Relation Age of Onset   Diabetes Son    Cancer Paternal Aunt    Stroke Paternal Grandmother   No bleeding or clotting disorders   Social History   Tobacco Use   Smoking status: Former Smoker    Quit date: 10/29/1974    Years since quitting: 45.1   Smokeless tobacco: Never Used  Substance Use Topics   Alcohol use: Yes   Drug use: No     Allergies  Allergen Reactions   Latex Hives   Prednisone Nausea And Vomiting    Pt states causes N/V even when taken with food.    Current Outpatient Medications  Medication Sig Dispense Refill   acetaminophen (TYLENOL) 325 MG tablet Take 325 mg by mouth.     albuterol (VENTOLIN HFA) 108 (90 Base) MCG/ACT inhaler  Inhale into the lungs.     apixaban (ELIQUIS) 5 MG TABS tablet TAKE 1 TABLET BY MOUTH TWICE A DAY     aspirin 81 MG EC tablet Take by mouth.     atorvastatin (LIPITOR) 80 MG tablet atorvastatin calcium 80 mg tabs     cetirizine (ZYRTEC) 10 MG tablet Take 10 mg by mouth daily.     cetirizine (ZYRTEC) 10 MG tablet TAKE 1 TABLET EVERY DAY     clonazePAM (KLONOPIN) 1 MG tablet clonazepam 1 mg tabs     gabapentin (NEURONTIN) 300 MG capsule TAKE 2 CAPSULES TWICE A DAY AND TAKE 3 CAPSULES AT BEDTIME  0   levothyroxine (SYNTHROID, LEVOTHROID) 88 MCG tablet Take 88 mcg by mouth daily before breakfast.     Lifitegrast (XIIDRA) 5 % SOLN Xiidra 5 % eye drops in a dropperette  PLACE 1 DROP IN OU BID     lubiprostone (AMITIZA) 24 MCG capsule Take 24 mcg by mouth 2 (two) times daily with a meal.     omeprazole (PRILOSEC) 20 MG capsule Take 20 mg by mouth daily.     venlafaxine (EFFEXOR) 50 MG tablet Take 50 mg by mouth 2 (two) times daily.     venlafaxine XR (EFFEXOR-XR) 150 MG 24 hr  capsule venlafaxine hcl er 150 mg cp24     clonazePAM (KLONOPIN) 1 MG tablet Take 2 mg by mouth at bedtime.     eszopiclone (LUNESTA) 2 MG TABS tablet Take 2 mg by mouth at bedtime as needed for sleep. Take immediately before bedtime     levothyroxine (SYNTHROID, LEVOTHROID) 100 MCG tablet Take by mouth.     nystatin (MYCOSTATIN/NYSTOP) 100000 UNIT/GM POWD Apply to the affected areas 2 to 3 times daily until healing is complete (Patient not taking: Reported on 02/25/2018) 1 Bottle 0   nystatin (NYSTATIN) powder Nystop 100,000 unit/gram topical powder  APPLY TO AFFECTED AREA 3 TIMES DAILY     oxyCODONE (OXY IR/ROXICODONE) 5 MG immediate release tablet Take 5 mg by mouth.     phentermine 30 MG capsule Take 30 mg by mouth every morning.     pravastatin (PRAVACHOL) 20 MG tablet Take 20 mg by mouth daily.     PRAVASTATIN SODIUM PO Take by mouth.     tiZANidine (ZANAFLEX) 4 MG capsule Take 4 mg by mouth 3  (three) times daily.     venlafaxine (EFFEXOR) 75 MG tablet Take 75 mg by mouth 2 (two) times daily with a meal.     warfarin (COUMADIN) 2.5 MG tablet Take 2.5 mg by mouth.     warfarin (COUMADIN) 3 MG tablet Take 3 mg by mouth daily.     No current facility-administered medications for this visit.      REVIEW OF SYSTEMS (Negative unless checked)  Constitutional: [] Weight loss  [] Fever  [] Chills Cardiac: [] Chest pain   [] Chest pressure   [] Palpitations   [] Shortness of breath when laying flat   [] Shortness of breath at rest   [] Shortness of breath with exertion. Vascular:  [] Pain in legs with walking   [] Pain in legs at rest   [] Pain in legs when laying flat   [] Claudication   [] Pain in feet when walking  [] Pain in feet at rest  [] Pain in feet when laying flat   [] History of DVT   [] Phlebitis   [] Swelling in legs   [] Varicose veins   [] Non-healing ulcers Pulmonary:   [] Uses home oxygen   [] Productive cough   [] Hemoptysis   [] Wheeze  [] COPD   [] Asthma Neurologic:  [] Dizziness  [] Blackouts   [] Seizures   [] History of stroke   [] History of TIA  [] Aphasia   [] Temporary blindness   [] Dysphagia   [] Weakness or numbness in arms   [] Weakness or numbness in legs Musculoskeletal:  [] Arthritis   [] Joint swelling   [] Joint pain   [] Low back pain Hematologic:  [] Easy bruising  [] Easy bleeding   [] Hypercoagulable state   [] Anemic  [] Hepatitis Gastrointestinal:  [] Blood in stool   [] Vomiting blood  [] Gastroesophageal reflux/heartburn   [] Abdominal pain Genitourinary:  [] Chronic kidney disease   [] Difficult urination  [] Frequent urination  [] Burning with urination   [] Hematuria Skin:  [] Rashes   [x] Ulcers   [x] Wounds Psychological:  [] History of anxiety   []  History of major depression.    Physical Exam BP (!) 142/84 (BP Location: Left Arm)    Resp 12    Ht 5\' 3"  (1.6 m)    BMI 32.06 kg/m  Gen:  WD/WN, NAD Head: [place]/AT, No temporalis wasting.  Ear/Nose/Throat: Hearing grossly intact, nares w/o  erythema or drainage, oropharynx w/o Erythema/Exudate Eyes: Conjunctiva clear, sclera non-icteric  Neck: trachea midline.  No JVD.  Pulmonary:  Good air movement, respirations not labored, no use of accessory muscles  Cardiac: RRR, no  JVD Vascular:  Vessel Right Left  Radial Palpable Palpable                          DP  palpable  palpable  PT  palpable  palpable   Gastrointestinal:. No masses, surgical incisions, or scars. Musculoskeletal: In a wheelchair.  Left hemiplegia extremities without ischemic changes.  No deformity or atrophy.  Mild lower extremity edema. Neurologic: Left hemiplegia.  Speech is fluent.  Psychiatric: Judgment intact, Mood & affect appropriate for pt's clinical situation. Dermatologic: Foot wounds dressed today.    Radiology No results found.  Labs No results found for this or any previous visit (from the past 2160 hour(s)).  Assessment/Plan:  Hyperlipidemia lipid control important in reducing the progression of atherosclerotic disease. Continue statin therapy   Collagen vascular disease ([place]) Could explain nonhealing ulcerations and wound issues.  Stroke [facility]) With resultant left hemiplegia making her more likely to get pressure ulcerations and other skin issues.  Ulcerated, foot, unspecified laterality, limited to breakdown of skin ([place]) Noninvasive studies were performed today to evaluate her adequacy for wound healing.  These demonstrated normal triphasic waveforms and normal ABIs of 1.17 bilaterally as well as normal digital pressures.  She certainly has adequate perfusion for wound healing.  No further vascular evaluation or treatment will be required at this time.  I will see the patient back as needed.      Leotis Pain 12/08/2019, 5:10 PM   This note was created with Dragon medical transcription system.  Any errors from dictation are unintentional.

## 2019-12-08 NOTE — Assessment & Plan Note (Signed)
Noninvasive studies were performed today to evaluate her adequacy for wound healing.  These demonstrated normal triphasic waveforms and normal ABIs of 1.17 bilaterally as well as normal digital pressures.  She certainly has adequate perfusion for wound healing.  No further vascular evaluation or treatment will be required at this time.  I will see the patient back as needed.

## 2019-12-08 NOTE — Assessment & Plan Note (Signed)
lipid control important in reducing the progression of atherosclerotic disease. Continue statin therapy  

## 2019-12-08 NOTE — Assessment & Plan Note (Signed)
With resultant left hemiplegia making her more likely to get pressure ulcerations and other skin issues.

## 2020-01-21 ENCOUNTER — Other Ambulatory Visit: Payer: Self-pay | Admitting: Podiatry

## 2020-01-25 ENCOUNTER — Encounter
Admission: RE | Admit: 2020-01-25 | Discharge: 2020-01-25 | Disposition: A | Discharge status: Home / Self Care | Payer: Medicare PPO | Source: Ambulatory Visit | Attending: Podiatry | Admitting: Podiatry

## 2020-01-25 ENCOUNTER — Other Ambulatory Visit: Payer: Self-pay

## 2020-01-25 DIAGNOSIS — Z01818 Encounter for other preprocedural examination: Secondary | ICD-10-CM | POA: Insufficient documentation

## 2020-01-25 HISTORY — DX: Other complications of anesthesia, initial encounter: T88.59XA

## 2020-01-25 HISTORY — DX: Gastro-esophageal reflux disease without esophagitis: K21.9

## 2020-01-25 HISTORY — DX: Hypothyroidism, unspecified: E03.9

## 2020-01-25 HISTORY — DX: Unspecified osteoarthritis, unspecified site: M19.90

## 2020-01-25 HISTORY — DX: Dyspnea, unspecified: R06.00

## 2020-01-25 HISTORY — DX: Personal history of urinary calculi: Z87.442

## 2020-01-25 NOTE — Patient Instructions (Addendum)
Your procedure is scheduled on: 02-05-20 FRIDAY Report to Same Day Surgery 2nd floor medical mall [facility] Entrance-take elevator on left to 2nd floor.  Check in with surgery information desk.) To find out your arrival time please call [phone] between 1PM - 3PM on 02-04-20 THURSDAY  Remember: Instructions that are not followed completely may result in serious medical risk, up to and including death, or upon the discretion of your surgeon and anesthesiologist your surgery may need to be rescheduled.    _x___ 1. Do not eat food after midnight the night before your procedure. NO GUM OR CANDY AFTER MIDNIGHT. You may drink clear liquids up to 2 hours before you are scheduled to arrive at the hospital for your procedure.  Do not drink clear liquids within 2 hours of your scheduled arrival to the hospital.  Clear liquids include  --Water or Apple juice without pulp  --Gatorade  --Black Coffee or Clear Tea (No milk, no creamers, do not add anything to the coffee or Tea   ____Ensure clear carbohydrate drink on the way to the hospital for bariatric patients  _X___Ensure clear carbohydrate drink -FINISH DRINK 2 HOURS PRIOR TO ARRIVAL [place]    __x__ 2. No Alcohol for 24 hours before or after surgery.   __x__3. No Smoking or e-cigarettes for 24 prior to surgery.  Do not use any chewable tobacco products for at least 6 hour prior to surgery   ____  4. Bring all medications with you on the day of surgery if instructed.    __x__ 5. Notify your doctor if there is any change in your medical condition     (cold, fever, infections).    x___6. On the morning of surgery brush your teeth with toothpaste and water.  You may rinse your mouth with mouth wash if you wish.  Do not swallow any toothpaste or mouthwash.   Do not wear jewelry, make-up, hairpins, clips or nail polish.  Do not wear lotions, powders, or perfumes.  Do not shave 48 hours prior to surgery. Men may shave face and  neck.  Do not bring valuables to the hospital.    [facility] is not responsible for any belongings or valuables.               Contacts, dentures or bridgework may not be worn into surgery.  Leave your suitcase in the car. After surgery it may be brought to your room.  For patients admitted to the hospital, discharge time is determined by your  treatment team.  _  Patients discharged the day of surgery will not be allowed to drive home.  You will need someone to drive you home and stay with you the night of your procedure.    Please read over the following fact sheets that you were given:   [facility] Preparing for Surgery/INCENTIVE SPIROMETER INSTRUCTIONS-THE ACTUAL INCENTIVE SPIROMETER WILL BE GIVEN TO YOU THE DAY OF SURGERY  _x___ TAKE THE FOLLOWING MEDICATION THE MORNING OF SURGERY WITH A SMALL SIP OF WATER. These include:  1. LIPITOR (ATORVASTATIN)  2. ZYRTEC (CETIRIZINE)  3. SYNTHROID (LEVOTHYROXINE)  4. EFFEXOR (VENLAFAXINE)  5. PRILOSEC (OMEPRAZOLE)  6. TAKE AN EXTRA PRILOSEC THE NIGHT BEFORE SURGERY  ____Fleets enema or Magnesium Citrate as directed.   _x___ Use CHG wipes as directed on instruction sheet   _X___ Use inhalers on the day of surgery and bring to hospital day of surgery-USE ALBUTEROL [place]  ____ Stop Metformin and Janumet 2 days prior to surgery.    ____ Take 1/2 of usual insulin dose the night before surgery and none on the morning surgery.   _x___ Follow recommendations from Cardiologist, Pulmonologist or PCP regarding stopping Aspirin, Coumadin, Plavix ,Eliquis, Effient, or Pradaxa, and Pletal-INSTRUCTED BY DR CLINES OFFICE TO STOP ELIQUIS AND ASPIRIN ON 02-03-20  X____Stop Anti-inflammatories such as Advil, Aleve, Ibuprofen, Motrin, Naproxen, Naprosyn, Goodies powders or aspirin products 7 DAYS PRIOR TO SURGERY-OK to take Tylenol    ____ Stop supplements until after surgery.    ____ Bring C-Pap to the  hospital.

## 2020-01-25 NOTE — Pre-Procedure Instructions (Signed)
ECG 12 lead3/09/2020 [facility] Result Impression  NSR, QT 400 - no ST/T wave changes.   Result Narrative  This result has an attachment that is not available.  Status Results Details

## 2020-01-28 NOTE — Pre-Procedure Instructions (Signed)
MEDICAL CLEARANCE ON CHART 

## 2020-02-03 ENCOUNTER — Other Ambulatory Visit: Payer: Medicare PPO

## 2020-02-05 ENCOUNTER — Ambulatory Visit: Admission: RE | Admit: 2020-02-05 | Payer: Medicare PPO | Source: Home / Self Care | Admitting: Podiatry

## 2020-02-05 ENCOUNTER — Encounter: Admission: RE | Payer: Self-pay | Source: Home / Self Care

## 2020-02-05 SURGERY — BUNIONECTOMY
Anesthesia: Choice | Site: Toe | Laterality: Bilateral

## 2020-03-16 ENCOUNTER — Other Ambulatory Visit: Payer: Self-pay

## 2020-03-16 ENCOUNTER — Emergency Department: Payer: Medicare PPO

## 2020-03-16 ENCOUNTER — Emergency Department
Admission: EM | Admit: 2020-03-16 | Discharge: 2020-03-16 | Disposition: A | Discharge status: Home / Self Care | Payer: Medicare PPO | Attending: Student | Admitting: Student

## 2020-03-16 DIAGNOSIS — W19XXXA Unspecified fall, initial encounter: Secondary | ICD-10-CM

## 2020-03-16 DIAGNOSIS — T07XXXA Unspecified multiple injuries, initial encounter: Secondary | ICD-10-CM

## 2020-03-16 DIAGNOSIS — S8992XA Unspecified injury of left lower leg, initial encounter: Secondary | ICD-10-CM | POA: Insufficient documentation

## 2020-03-16 DIAGNOSIS — S5012XA Contusion of left forearm, initial encounter: Secondary | ICD-10-CM | POA: Insufficient documentation

## 2020-03-16 DIAGNOSIS — Y929 Unspecified place or not applicable: Secondary | ICD-10-CM | POA: Insufficient documentation

## 2020-03-16 DIAGNOSIS — Y999 Unspecified external cause status: Secondary | ICD-10-CM | POA: Diagnosis not present

## 2020-03-16 DIAGNOSIS — W1839XA Other fall on same level, initial encounter: Secondary | ICD-10-CM | POA: Insufficient documentation

## 2020-03-16 DIAGNOSIS — Y9389 Activity, other specified: Secondary | ICD-10-CM | POA: Diagnosis not present

## 2020-03-16 DIAGNOSIS — S59912A Unspecified injury of left forearm, initial encounter: Secondary | ICD-10-CM | POA: Diagnosis present

## 2020-03-16 DIAGNOSIS — I69354 Hemiplegia and hemiparesis following cerebral infarction affecting left non-dominant side: Secondary | ICD-10-CM | POA: Insufficient documentation

## 2020-03-16 DIAGNOSIS — S60212A Contusion of left wrist, initial encounter: Secondary | ICD-10-CM | POA: Diagnosis not present

## 2020-03-16 DIAGNOSIS — S4992XA Unspecified injury of left shoulder and upper arm, initial encounter: Secondary | ICD-10-CM | POA: Diagnosis not present

## 2020-03-16 DIAGNOSIS — S40022A Contusion of left upper arm, initial encounter: Secondary | ICD-10-CM | POA: Insufficient documentation

## 2020-03-16 DIAGNOSIS — S8002XA Contusion of left knee, initial encounter: Secondary | ICD-10-CM | POA: Insufficient documentation

## 2020-03-16 DIAGNOSIS — S6992XA Unspecified injury of left wrist, hand and finger(s), initial encounter: Secondary | ICD-10-CM | POA: Diagnosis not present

## 2020-03-16 MED ORDER — HYDROCODONE-ACETAMINOPHEN 5-325 MG PO TABS
1.0000 | ORAL_TABLET | Freq: Once | ORAL | Status: AC
  Administered 2020-03-16: 1 via ORAL
  Filled 2020-03-16: qty 1

## 2020-03-16 NOTE — ED Triage Notes (Signed)
Pt reports she sat on her bedside toilet the wrong way and landed on the floor. PT reports left wrist and arm pain. 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 NAD noted at this time.

## 2020-03-16 NOTE — ED Provider Notes (Signed)
[facility] Emergency Department Provider Note  ____________________________________________  Time seen: Approximately 3:06 PM  I have reviewed the triage vital signs and the nursing notes.   HISTORY  Chief Complaint Fall    HPI Ariel Alvarez is a 74 y.o. female who presents the emergency department complaining of left arm injury, left knee pain after a fall.  Patient has a history of CVA with chronic left side weakness.  Patient states that she was transitioning from the bed to her bedside commode when she believes that her foot gave way underneath her causing her to fall.  Patient states that she awkwardly landed on the bedside commode, then this tipped over and fell landing on her left side.  She is complaining of left arm pain, left knee pain.  She is diffusely hurting from the shoulder down to the hands on the left upper arm and specifically to the left knee.  She did not hit her head or lose consciousness.  No headache, visual changes, neck pain.  No chest pain, shortness of breath, abdominal pain.  Patient's primary pain complaint is the left wrist and she endorses swelling and a possible deformity in this area.  Other regions hurt, though the wrist is the most painful.  Patient has significant weakness on the left side and states that her hand is in constant traction.         Past Medical History:  Diagnosis Date  . Anxiety   . Arthritis    RIGHT HAND  . Collagen vascular disease (HCC)   . Complication of anesthesia    HARD TO WAKE UP AFTER  ONE SURGERY-PT STAETS SHE WAS GIVEN TOO MUCH ANESTHESIA  . Depression   . Dyspnea    VERY RARE WITH EXERTION  . GERD (gastroesophageal reflux disease)   . History of kidney stones    H/O  . Hypothyroidism   . Stroke [facility]) [id number]   X2-LEFT HAD PARALYZED     Patient Active Problem List   Diagnosis Date Noted  . Hyperlipidemia 12/08/2019  . Stroke (HCC) 12/08/2019  . Ulcerated, foot, unspecified  laterality, limited to breakdown of skin (HCC) 12/08/2019  . Collagen vascular disease (HCC) 05/21/2017  . 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 CHOLECYSTECTOMY    . KIDNEY STONE SURGERY      Prior to Admission medications   Medication Sig Start Date End Date Taking? Authorizing Provider  acetaminophen (TYLENOL) 325 MG tablet Take 325 mg by mouth every 6 (six) hours as needed.  12/22/08   [provider]  albuterol (VENTOLIN HFA) 108 (90 Base) MCG/ACT inhaler Inhale 1-2 puffs into the lungs every 6 (six) hours as needed.  09/30/18   [provider]  apixaban (ELIQUIS) 5 MG TABS tablet TAKE 1 TABLET BY MOUTH TWICE A DAY 11/26/19   [provider]  aspirin 81 MG EC tablet Take 81 mg by mouth in the morning and at bedtime.     [provider]  atorvastatin (LIPITOR) 40 MG tablet Take 40 mg by mouth daily with lunch.    [provider]  atorvastatin (LIPITOR) 80 MG tablet atorvastatin calcium 80 mg tabs    [provider]  cetirizine (ZYRTEC) 10 MG tablet Take 10 mg by mouth daily after lunch.     [provider]  cetirizine (ZYRTEC) 10 MG tablet TAKE 1 TABLET EVERY DAY 03/07/15   [provider]  clonazePAM (KLONOPIN) 1 MG tablet Take 2 mg by mouth at bedtime.    [provider]  clonazePAM (KLONOPIN) 1 MG tablet clonazepam 1 mg tabs    [provider]  diphenhydramine-acetaminophen (TYLENOL PM) 25-500 MG TABS tablet Take 1 tablet by mouth at bedtime.    [provider]  eszopiclone (LUNESTA) 2 MG TABS tablet Take 2 mg by mouth at bedtime as needed for sleep. Take immediately before bedtime    [provider]  gabapentin (NEURONTIN) 300 MG capsule TAKE 2 CAPSULES TWICE A DAY AND TAKE 3 CAPSULES AT BEDTIME 03/12/17   [provider]  hydroxychloroquine (PLAQUENIL) 200 MG tablet Take 200 mg by mouth 2 (two) times daily.     [provider]  levothyroxine (SYNTHROID, LEVOTHROID) 100 MCG tablet Take by mouth. 04/19/17 04/19/18  [provider]  levothyroxine (SYNTHROID, LEVOTHROID) 88 MCG tablet Take 88 mcg by mouth daily before breakfast.    [provider]  Lifitegrast (XIIDRA) 5 % SOLN Xiidra 5 % eye drops in a dropperette  PLACE 1 DROP IN OU BID    [provider]  lubiprostone (AMITIZA) 24 MCG capsule Take 24 mcg by mouth 2 (two) times daily with a meal.    [provider]  nystatin (MYCOSTATIN/NYSTOP) 100000 UNIT/GM POWD Apply to the affected areas 2 to 3 times daily until healing is complete Patient not taking: Reported on 02/25/2018 08/02/15   Marylene Land, NP  nystatin (NYSTATIN) powder Nystop 100,000 unit/gram topical powder  APPLY TO AFFECTED AREA 3 TIMES DAILY    [provider]  omeprazole (PRILOSEC) 20 MG capsule Take 20 mg by mouth daily with lunch.     [provider]  oxyCODONE (OXY IR/ROXICODONE) 5 MG immediate release tablet Take 5 mg by mouth. 04/19/17   [provider]  phentermine 30 MG capsule Take 30 mg by mouth every morning.    [provider]  pravastatin (PRAVACHOL) 20 MG tablet Take 20 mg by mouth daily.    [provider]  PRAVASTATIN SODIUM PO Take by mouth.    [provider]  tiZANidine (ZANAFLEX) 4 MG capsule Take 4 mg by mouth 3 (three) times daily.    [provider]  venlafaxine (EFFEXOR) 50 MG tablet Take 50 mg by mouth 2 (two) times daily.    [provider]  venlafaxine (EFFEXOR) 75 MG tablet Take 75 mg by mouth daily.     [provider]  venlafaxine XR (EFFEXOR-XR) 150 MG 24 hr capsule venlafaxine hcl er 150 mg cp24    [provider]  warfarin (COUMADIN) 2.5 MG tablet Take 2.5 mg by mouth.    [provider]  warfarin (COUMADIN) 3 MG tablet Take 3 mg by mouth daily.    [provider]    Allergies Latex and  Prednisone  Family History  Problem Relation Age of Onset  . Diabetes Son   . Cancer Paternal Aunt   . Stroke Paternal Grandmother     Social History Social History   Tobacco Use  . Smoking status: Former Smoker    Packs/day: 1.00    Years: 20.00    Pack years: 20.00    Types: Cigarettes    Quit date: 10/29/1974    Years since quitting: 45.4  . Smokeless tobacco: Never Used  Substance Use Topics  . Alcohol use: Not Currently  . Drug use: No     Review of Systems  Constitutional: No fever/chills Eyes: No visual changes.  No discharge ENT: No upper respiratory complaints. Cardiovascular: no chest pain. Respiratory: no cough. No SOB. Gastrointestinal: No abdominal pain.  No nausea, no vomiting.   Musculoskeletal: Positive for left upper extremity pain, left knee pain after a fall Skin: Negative for rash, abrasions, lacerations, ecchymosis. Neurological: Negative for headaches, focal weakness or numbness. 10-point ROS otherwise negative.  ____________________________________________   PHYSICAL EXAM:  VITAL SIGNS: ED Triage Vitals  Enc Vitals Group     BP 03/16/20 1349 121/68     Pulse Rate 03/16/20 1349 84     Resp 03/16/20 1349 18     Temp 03/16/20 1349 98.3 F (36.8 C)     Temp Source 03/16/20 1349 Oral     SpO2 03/16/20 1349 95 %     Weight 03/16/20 1350 250 lb (113.4 kg)     Height 03/16/20 1350 5\' 3"  (1.6 m)     Head Circumference --      Peak Flow --      Pain Score 03/16/20 1350 5     Pain Loc --      Pain Edu? --      Excl. in GC? --      Constitutional: Alert and oriented. Well appearing and in no acute distress. Eyes: Conjunctivae are normal. PERRL. EOMI. Head: Atraumatic. ENT:      Ears:       Nose: No congestion/rhinnorhea.      Mouth/Throat: Mucous membranes are moist.  Neck: No stridor.  No cervical spine tenderness to palpation.  Cardiovascular: Normal rate, regular rhythm. Normal S1 and S2.  Good peripheral circulation. Respiratory:  Normal respiratory effort without tachypnea or retractions. Lungs CTAB. Good air entry to the bases with no decreased or absent breath sounds. Gastrointestinal: Bowel sounds 4 quadrants. Soft and nontender to palpation. No guarding or rigidity. No palpable masses. No distention.  Musculoskeletal: Full range of motion to all extremities. No gross deformities appreciated.  Visualization of the left upper extremity reveals no abrasions, lacerations, ecchymosis.  Patient does have edema/possible mild deformity about the left wrist.  Patient has limited range of motion to the left upper extremity which is baseline.  Patient is tender to palpation diffusely over the left upper extremity with point specific tenderness over the distal forearm.  Possible palpable abnormality versus soft tissue edema along the medial aspect of the forearm.  Patient's hand is in constant contracture.  Sensation intact to the left upper extremity.  Examination of the left knee reveals no visible signs of trauma.  Good range of motion.  Mild tenderness to palpation along the lateral aspect and over the patella.  No palpable abnormalities or deficits.  Dorsalis pedis pulse and sensation intact distally. Neurologic:  Normal speech and language. No gross focal neurologic deficits are appreciated.  Skin:  Skin is warm, dry and intact. No rash noted. Psychiatric: Mood and affect are normal. Speech and behavior are normal. Patient exhibits appropriate insight and judgement.   ____________________________________________   LABS (all labs ordered are listed, but only abnormal results are displayed)  Labs Reviewed - No data to display ____________________________________________  EKG   ____________________________________________  RADIOLOGY I personally viewed and evaluated these images as part of my medical decision making, as well as reviewing the written report by the radiologist.  DG Forearm Left  Result Date:  03/16/2020 CLINICAL DATA:  Fell onto left arm, pain EXAM: LEFT FOREARM - 2 VIEW COMPARISON:  None. FINDINGS: Frontal and lateral views of the left forearm are obtained. There  is a prior healed mid radial diaphyseal fracture. No acute displaced fracture. The wrist and elbow are well aligned. Dorsal soft tissue swelling of the distal forearm is noted. IMPRESSION: 1. Prior healed mid right radial diaphyseal fracture. 2. No acute bony abnormality. Electronically Signed   By: Sharlet Salina M.D.   On: 03/16/2020 16:32   DG Wrist Complete Left  Result Date: 03/16/2020 CLINICAL DATA:  Larey Seat, left arm pain EXAM: LEFT WRIST - COMPLETE 3+ VIEW COMPARISON:  None. FINDINGS: Frontal, oblique, and lateral views of the left wrist are obtained. The bones are osteopenic. There is volar angulation distal margin fifth metacarpal consistent with fracture, which may be chronic. No acute displaced fracture. There is dorsal soft tissue swelling of the distal forearm. IMPRESSION: 1. No acute displaced fracture of the left wrist. 2. Likely chronic healed fracture of the fifth metacarpal. Please correlate with physical exam. 3. Diffuse osteopenia. Electronically Signed   By: Sharlet Salina M.D.   On: 03/16/2020 16:30   DG Knee Complete 4 Views Left  Result Date: 03/16/2020 CLINICAL DATA:  Larey Seat, left knee injury EXAM: LEFT KNEE - COMPLETE 4+ VIEW COMPARISON:  06/01/2018 FINDINGS: Frontal, bilateral oblique, lateral views of the left knee are obtained. No fracture, subluxation, or dislocation. Mild medial and lateral compartmental joint space narrowing and osteophyte formation. No joint effusion. IMPRESSION: 1. Mild osteoarthritis, no acute fracture. Electronically Signed   By: Sharlet Salina M.D.   On: 03/16/2020 16:34   DG Humerus Left  Result Date: 03/16/2020 CLINICAL DATA:  Larey Seat onto left arm, pain EXAM: LEFT HUMERUS - 2+ VIEW COMPARISON:  03/02/2017 FINDINGS: Frontal and lateral views of the left humerus demonstrate a prior  healed left humeral neck fracture. No acute displaced fracture. Left shoulder and elbow appear well aligned. IMPRESSION: 1. Prior healed left humeral neck fracture. No acute bony abnormality. Electronically Signed   By: Sharlet Salina M.D.   On: 03/16/2020 16:31    ____________________________________________    PROCEDURES  Procedure(s) performed:    Procedures    Medications  HYDROcodone-acetaminophen (NORCO/VICODIN) 5-325 MG per tablet 1 tablet (1 tablet Oral Given 03/16/20 1522)     ____________________________________________   INITIAL IMPRESSION / ASSESSMENT AND PLAN / ED COURSE  Pertinent labs & imaging results that were available during my care of the patient were reviewed by me and considered in my medical decision making (see chart for details).  Review of the [place] CSRS was performed in accordance of the [facility] prior to dispensing any controlled drugs.           Patient's diagnosis is consistent with multiple contusions following a fall.  Patient states that she was attempting to transfer from the bed to bedside commode when she believes her foot gave out from underneath her causing her to fall.  She briefly landed on the bedside commode causing this to fall over and her landed on her left side.  She not hit her head or lose consciousness.  Patient was complaining of left upper extremity pain and left knee pain.  Imaging is reassuring with no acute fractures.  Patient has an old fracture to the left proximal humerus/shoulder.  No evidence of new injury to the osseous structures of the shoulder.  At this time, patient is stable for discharge.  No indication for further work-up..  Patient is given ED precautions to return to the ED for any worsening or new symptoms.     ____________________________________________  FINAL CLINICAL IMPRESSION(S) / ED DIAGNOSES  Final  diagnoses:  Fall, initial encounter  Multiple contusions      NEW MEDICATIONS STARTED DURING THIS  VISIT:  ED Discharge Orders    None          This chart was dictated using voice recognition software/Dragon. Despite best efforts to proofread, errors can occur which can change the meaning. Any change was purely unintentional.    Racheal Patches, PA-C 03/16/20 2245    Miguel Aschoff., MD 03/17/20 0157

## 2020-03-16 NOTE — ED Notes (Signed)
Pt states she stepped wrong getting onto the bedside commode at home today and fell onto her left side, c/o pain to the whole left arm and left knee. Pt has a hx of previous stroke and has weakness on the left side.

## 2020-03-16 NOTE — ED Triage Notes (Signed)
Pt in via EMS from home. EMS reports pt fell off of her BSC and thinks she broke her left wrist. Pt with hx of 2 CVA's and has no use of left side. Denies LOC. Per husband, pt left wrist is swollen and bruised. Pt also reports some pain to left knee and an abrasion  140/60, HR 65, 94-95 % on RA

## 2020-04-07 DIAGNOSIS — S62647D Nondisplaced fracture of proximal phalanx of left little finger, subsequent encounter for fracture with routine healing: Secondary | ICD-10-CM | POA: Insufficient documentation

## 2020-04-11 ENCOUNTER — Other Ambulatory Visit: Payer: Self-pay | Admitting: Podiatry

## 2020-04-13 ENCOUNTER — Encounter
Admission: RE | Admit: 2020-04-13 | Discharge: 2020-04-13 | Disposition: A | Discharge status: Home / Self Care | Payer: Medicare PPO | Source: Ambulatory Visit | Attending: Podiatry | Admitting: Podiatry

## 2020-04-13 ENCOUNTER — Other Ambulatory Visit: Payer: Self-pay

## 2020-04-13 DIAGNOSIS — Z01818 Encounter for other preprocedural examination: Secondary | ICD-10-CM | POA: Insufficient documentation

## 2020-04-13 NOTE — Patient Instructions (Addendum)
Your procedure is scheduled on: 04-15-20 FRIDAY Report to Same Day Surgery 2nd floor medical mall [facility] Entrance-take elevator on left to 2nd floor.  Check in with surgery information desk.) To find out your arrival time please call [phone] between 1PM - 3PM on 04-14-20 THURSDAY  Remember: Instructions that are not followed completely may result in serious medical risk, up to and including death, or upon the discretion of your surgeon and anesthesiologist your surgery may need to be rescheduled.    _x___ 1. Do not eat food after midnight the night before your procedure. NO GUM OR CANDY AFTER MIDNIGHT. You may drink clear liquids up to 2 hours before you are scheduled to arrive at the hospital for your procedure.  Do not drink clear liquids within 2 hours of your scheduled arrival to the hospital.  Clear liquids include  --Water or Apple juice without pulp  --Gatorade  --Black Coffee or Clear Tea (No milk, no creamers, do not add anything to the coffee or Tea-ok to add sugar)   ____Ensure clear carbohydrate drink on the way to the hospital for bariatric patients  _X___Ensure clear carbohydrate drink-FINISH DRINK 2 HOURS PRIOR TO ARRIVAL [place]    __x__ 2. No Alcohol for 24 hours before or after surgery.   __x__3. No Smoking or e-cigarettes for 24 prior to surgery.  Do not use any chewable tobacco products for at least 6 hour prior to surgery   ____  4. Bring all medications with you on the day of surgery if instructed.    __x__ 5. Notify your doctor if there is any change in your medical condition     (cold, fever, infections).    x___6. On the morning of surgery brush your teeth with toothpaste and water.  You may rinse your mouth with mouth wash if you wish.  Do not swallow any toothpaste or mouthwash.   Do not wear jewelry, make-up, hairpins, clips or nail polish.  Do not wear lotions, powders, or perfumes.   Do not shave 48 hours prior to  surgery. Men may shave face and neck.  Do not bring valuables to the hospital.    [facility] is not responsible for any belongings or valuables.               Contacts, dentures or bridgework may not be worn into surgery.  Leave your suitcase in the car. After surgery it may be brought to your room.  For patients admitted to the hospital, discharge time is determined by your treatment team.  _  Patients discharged the day of surgery will not be allowed to drive home.  You will need someone to drive you home and stay with you the night of your procedure.    Please read over the following fact sheets that you were given:   [facility] Preparing for Surgery   _x___ TAKE THE FOLLOWING MEDICATION THE MORNING OF SURGERY WITH SMALL SIP OF WATER. These include:  1. CETIRIZINE  2. LEVOTHYROXINE  3. OMEPRAZOLE  4. VENLAFAXINE  5. GABAPENTIN   6. ATORVASTATIN  ____Fleets enema or Magnesium Citrate as directed.   _x___ Use CHG WIPES as directed on instruction sheet   ____ Use inhalers on the day of surgery and bring to hospital day of surgery  ____ Stop Metformin and Janumet 2 days prior to surgery.    ____ Take 1/2 of usual insulin dose the night before surgery and none on the morning  surgery.   _x___ Follow recommendations from Cardiologist, Pulmonologist or PCP regarding stopping Aspirin, Coumadin, Plavix ,Eliquis, Effient, or Pradaxa, and Pletal-LAST DOSE OF ELIQUIS AND ASPIRIN WAS ON 04-12-20 PER ROBERT (PTS SIGNIFICANT OTHER)  X____Stop Anti-inflammatories such as Advil, Aleve, Ibuprofen, Motrin, Naproxen, Naprosyn, Goodies powders or aspirin products NOW-OK to take Tylenol    ____ Stop supplements until after surgery.   ____ Bring C-Pap to the hospital.

## 2020-04-14 ENCOUNTER — Other Ambulatory Visit
Admission: RE | Admit: 2020-04-14 | Discharge: 2020-04-14 | Disposition: A | Discharge status: Home / Self Care | Payer: Medicare PPO | Source: Ambulatory Visit | Attending: Podiatry | Admitting: Podiatry

## 2020-04-14 DIAGNOSIS — Z01812 Encounter for preprocedural laboratory examination: Secondary | ICD-10-CM | POA: Insufficient documentation

## 2020-04-14 DIAGNOSIS — Z20822 Contact with and (suspected) exposure to covid-19: Secondary | ICD-10-CM | POA: Insufficient documentation

## 2020-04-14 LAB — SARS CORONAVIRUS 2 (TAT 6-24 HRS): SARS Coronavirus 2: NEGATIVE

## 2020-04-15 ENCOUNTER — Encounter
Admission: RE | Disposition: A | Discharge status: Home / Self Care | Payer: Self-pay | Source: Home / Self Care | Attending: Podiatry

## 2020-04-15 ENCOUNTER — Ambulatory Visit: Payer: Medicare PPO | Admitting: Certified Registered Nurse Anesthetist

## 2020-04-15 ENCOUNTER — Ambulatory Visit
Admission: RE | Admit: 2020-04-15 | Discharge: 2020-04-15 | Disposition: A | Discharge status: Home / Self Care | Payer: Medicare PPO | Attending: Podiatry | Admitting: Podiatry

## 2020-04-15 ENCOUNTER — Encounter: Payer: Self-pay | Admitting: Podiatry

## 2020-04-15 ENCOUNTER — Other Ambulatory Visit: Payer: Self-pay

## 2020-04-15 DIAGNOSIS — Z87891 Personal history of nicotine dependence: Secondary | ICD-10-CM | POA: Insufficient documentation

## 2020-04-15 DIAGNOSIS — G4730 Sleep apnea, unspecified: Secondary | ICD-10-CM | POA: Insufficient documentation

## 2020-04-15 DIAGNOSIS — J449 Chronic obstructive pulmonary disease, unspecified: Secondary | ICD-10-CM | POA: Diagnosis not present

## 2020-04-15 DIAGNOSIS — M899 Disorder of bone, unspecified: Secondary | ICD-10-CM | POA: Insufficient documentation

## 2020-04-15 DIAGNOSIS — Z79899 Other long term (current) drug therapy: Secondary | ICD-10-CM | POA: Diagnosis not present

## 2020-04-15 DIAGNOSIS — M2012 Hallux valgus (acquired), left foot: Secondary | ICD-10-CM | POA: Insufficient documentation

## 2020-04-15 DIAGNOSIS — K219 Gastro-esophageal reflux disease without esophagitis: Secondary | ICD-10-CM | POA: Diagnosis not present

## 2020-04-15 DIAGNOSIS — M2011 Hallux valgus (acquired), right foot: Secondary | ICD-10-CM | POA: Diagnosis not present

## 2020-04-15 DIAGNOSIS — F419 Anxiety disorder, unspecified: Secondary | ICD-10-CM | POA: Diagnosis not present

## 2020-04-15 HISTORY — PX: METATARSAL HEAD EXCISION: SHX5027

## 2020-04-15 SURGERY — EXCISION, METATARSAL BONE, HEAD
Anesthesia: General | Site: Toe | Laterality: Bilateral

## 2020-04-15 MED ORDER — LACTATED RINGERS IV SOLN: INTRAVENOUS | Status: DC | PRN

## 2020-04-15 MED ORDER — CHLORHEXIDINE GLUCONATE 0.12 % MT SOLN
OROMUCOSAL | Status: AC
  Filled 2020-04-15: qty 15

## 2020-04-15 MED ORDER — PROPOFOL 500 MG/50ML IV EMUL
INTRAVENOUS | Status: DC | PRN
Start: 2020-04-15 — End: 2020-04-15
  Administered 2020-04-15: 5 ug/kg/min via INTRAVENOUS

## 2020-04-15 MED ORDER — CEFAZOLIN SODIUM-DEXTROSE 2-4 GM/100ML-% IV SOLN
2.0000 g | INTRAVENOUS | Status: AC
  Administered 2020-04-15: 2 g via INTRAVENOUS

## 2020-04-15 MED ORDER — OXYCODONE HCL 5 MG PO TABS: 5.0000 mg | ORAL_TABLET | ORAL | 0 refills | Status: DC | PRN

## 2020-04-15 MED ORDER — LIDOCAINE HCL (PF) 2 % IJ SOLN
INTRAMUSCULAR | Status: AC
  Filled 2020-04-15: qty 5

## 2020-04-15 MED ORDER — CEFAZOLIN SODIUM-DEXTROSE 2-4 GM/100ML-% IV SOLN
INTRAVENOUS | Status: AC
  Filled 2020-04-15: qty 100

## 2020-04-15 MED ORDER — PROPOFOL 10 MG/ML IV BOLUS
INTRAVENOUS | Status: DC | PRN
  Administered 2020-04-15: 200 mg via INTRAVENOUS

## 2020-04-15 MED ORDER — FAMOTIDINE 20 MG PO TABS: 20.0000 mg | ORAL_TABLET | Freq: Once | ORAL | Status: AC

## 2020-04-15 MED ORDER — BUPIVACAINE HCL (PF) 0.5 % IJ SOLN
INTRAMUSCULAR | Status: DC | PRN
  Administered 2020-04-15: 20 mL

## 2020-04-15 MED ORDER — LACTATED RINGERS IV SOLN
INTRAVENOUS | Status: DC
  Administered 2020-04-15: 50 mL/h via INTRAVENOUS

## 2020-04-15 MED ORDER — LIDOCAINE HCL (CARDIAC) PF 100 MG/5ML IV SOSY
PREFILLED_SYRINGE | INTRAVENOUS | Status: DC | PRN
  Administered 2020-04-15: 100 mg via INTRAVENOUS

## 2020-04-15 MED ORDER — FAMOTIDINE 20 MG PO TABS
ORAL_TABLET | ORAL | Status: AC
  Administered 2020-04-15: 20 mg via ORAL
  Filled 2020-04-15: qty 1

## 2020-04-15 MED ORDER — POVIDONE-IODINE 7.5 % EX SOLN
Freq: Once | CUTANEOUS | Status: DC
  Filled 2020-04-15: qty 118

## 2020-04-15 MED ORDER — ORAL CARE MOUTH RINSE: 15.0000 mL | Freq: Once | OROMUCOSAL | Status: AC

## 2020-04-15 MED ORDER — NEOMYCIN-POLYMYXIN B GU 40-200000 IR SOLN
Status: DC | PRN
  Administered 2020-04-15: 2 mL

## 2020-04-15 MED ORDER — ONDANSETRON HCL 4 MG/2ML IJ SOLN: 4.0000 mg | Freq: Once | INTRAMUSCULAR | Status: DC | PRN

## 2020-04-15 MED ORDER — ONDANSETRON HCL 4 MG/2ML IJ SOLN
INTRAMUSCULAR | Status: DC | PRN
  Administered 2020-04-15: 4 mg via INTRAVENOUS

## 2020-04-15 MED ORDER — ONDANSETRON HCL 4 MG/2ML IJ SOLN
INTRAMUSCULAR | Status: AC
  Filled 2020-04-15: qty 2

## 2020-04-15 MED ORDER — CHLORHEXIDINE GLUCONATE 0.12 % MT SOLN
15.0000 mL | Freq: Once | OROMUCOSAL | Status: AC
  Administered 2020-04-15: 15 mL via OROMUCOSAL

## 2020-04-15 MED ORDER — FENTANYL CITRATE (PF) 100 MCG/2ML IJ SOLN: 25.0000 ug | INTRAMUSCULAR | Status: DC | PRN

## 2020-04-15 SURGICAL SUPPLY — 28 items
BLADE MED AGGRESSIVE (BLADE) ×2 IMPLANT
BLADE SURG 15 STRL LF DISP TIS (BLADE) ×2 IMPLANT
BLADE SURG 15 STRL SS (BLADE) ×2
BLADE SURG MINI STRL (BLADE) ×2 IMPLANT
BNDG CONFORM 3 STRL LF (GAUZE/BANDAGES/DRESSINGS) ×4 IMPLANT
BNDG ESMARK 4X12 TAN STRL LF (GAUZE/BANDAGES/DRESSINGS) ×2 IMPLANT
CANISTER SUCT 1200ML W/VALVE (MISCELLANEOUS) ×2 IMPLANT
CUFF TOURN SGL QUICK 18X4 (TOURNIQUET CUFF) ×4 IMPLANT
DURAPREP 26ML APPLICATOR (WOUND CARE) ×4 IMPLANT
ELECT REM PT RETURN 9FT ADLT (ELECTROSURGICAL) ×2
ELECTRODE REM PT RTRN 9FT ADLT (ELECTROSURGICAL) ×1 IMPLANT
GAUZE SPONGE 4X4 12PLY STRL (GAUZE/BANDAGES/DRESSINGS) ×2 IMPLANT
GAUZE XEROFORM 1X8 LF (GAUZE/BANDAGES/DRESSINGS) ×2 IMPLANT
GLOVE BIO SURGEON STRL SZ7.5 (GLOVE) ×2 IMPLANT
GLOVE INDICATOR 8.0 STRL GRN (GLOVE) ×2 IMPLANT
GOWN STRL REUS W/ TWL LRG LVL3 (GOWN DISPOSABLE) ×3 IMPLANT
GOWN STRL REUS W/TWL LRG LVL3 (GOWN DISPOSABLE) ×3
KIT TURNOVER KIT A (KITS) ×2 IMPLANT
NDL SAFETY ECLIPSE 18X1.5 (NEEDLE) ×1 IMPLANT
NEEDLE HYPO 18GX1.5 SHARP (NEEDLE) ×1
NEEDLE HYPO 25X1 1.5 SAFETY (NEEDLE) ×4 IMPLANT
NS IRRIG 500ML POUR BTL (IV SOLUTION) ×2 IMPLANT
PACK EXTREMITY (MISCELLANEOUS) ×2 IMPLANT
PAD PREP 24X41 OB/GYN DISP (PERSONAL CARE ITEMS) ×2 IMPLANT
RASP SM TEAR CROSS CUT (RASP) ×2 IMPLANT
STRIP CLOSURE SKIN 1/4X4 (GAUZE/BANDAGES/DRESSINGS) ×2 IMPLANT
SUT VIC AB 4-0 FS2 27 (SUTURE) ×4 IMPLANT
SYR 10ML LL (SYRINGE) ×4 IMPLANT

## 2020-04-15 NOTE — Anesthesia Preprocedure Evaluation (Addendum)
Anesthesia Evaluation  Patient identified by MRN, date of birth, ID band Patient awake    Reviewed: Allergy & Precautions, NPO status , Patient's Chart, lab work & pertinent test results  History of Anesthesia Complications (+) PROLONGED EMERGENCE and history of anesthetic complications  Airway Mallampati: III       Dental  (+) Partial Upper, Chipped, Poor Dentition   Pulmonary sleep apnea (not using CPAP) , COPD,  COPD inhaler, Not current smoker, former smoker,           Cardiovascular (-) hypertension(-) Past MI and (-) CHF (-) dysrhythmias (-) Valvular Problems/Murmurs     Neuro/Psych neg Seizures Anxiety Depression CVA (L sided weakness, speech difficulties), Residual Symptoms    GI/Hepatic Neg liver ROS, GERD  Medicated and Controlled,  Endo/Other  neg diabetesHypothyroidism   Renal/GU negative Renal ROS     Musculoskeletal   Abdominal   Peds  Hematology   Anesthesia Other Findings   Reproductive/Obstetrics                             Anesthesia Physical Anesthesia Plan  ASA: III  Anesthesia Plan: General   Post-op Pain Management:    Induction: Intravenous  PONV Risk Score and Plan: 3 and Propofol infusion, TIVA and Treatment may vary due to age or medical condition  Airway Management Planned: LMA  Additional Equipment:   Intra-op Plan:   Post-operative Plan:   Informed Consent: I have reviewed the patients History and Physical, chart, labs and discussed the procedure including the risks, benefits and alternatives for the proposed anesthesia with the patient or authorized representative who has indicated his/her understanding and acceptance.       Plan Discussed with:   Anesthesia Plan Comments:        Anesthesia Quick Evaluation

## 2020-04-15 NOTE — Op Note (Signed)
Date of operation: 04/15/2020.  Surgeon: Ricci Barker D.P.M.  Preoperative diagnosis: Hallux valgus deformity with exostosis bilateral.  Postoperative diagnosis: Same.  Procedures: 1.  Exostectomy right first metatarsal. 2.  Exostectomy left first metatarsal.  Anesthesia: LMA with local.  Hemostasis: Pneumatic tourniquet bilateral ankles, 250 mmHg.  Estimated blood loss: Less than 5 cc.  Pathology: Exostosis left first metatarsal head.  Complications: None apparent.  Operative indications: This is a 74 year old female with a chronic history of hallux valgus deformity causing recurrent ulcerations on both of her great toe joints.  Even though the patient is nonambulatory she has decided to pursue surgery to remove the bony prominence so that she can get back into shoes without ulcerating.  Operative procedure: Patient was taken to the operating room and placed on the table in the supine position.  Following satisfactory LMA anesthesia the bilateral feet were injected with 10 cc of 0.5% Marcaine plain around the first metatarsal.  Pneumatic tourniquets applied at the level of the ankle bilateral and the feet were prepped and draped in the usual sterile fashion.  The right foot was exsanguinated and the tourniquet inflated to 250 mmHg. Attention was then directed to the dorsal aspect of the right foot where an approximate 4 cm linear incision was made coursing proximal to distal over the first metatarsal and metatarsophalangeal joint.  Incision was deepened via sharp and blunt dissection down to the level of the joint where a linear capsulotomy was performed and the capsular and periosteal tissues reflected off of the medial head of the first metatarsal.  Using a sagittal saw the medial eminence was removed and the edges were rasped smooth.  The wound was flushed with copious amounts of sterile saline and closed using 4-0 Vicryl suture in a running fashion for periosteal closure as well as deep  and superficial subcutaneous as well as skin closure.  Tincture of benzoin Steri-Strips Xeroform 4 x 4's and con form applied to the right foot.  Tourniquet was released and blood flow noted to return immediately to the right foot and all digits. Attention was then directed to the dorsal aspect of the left foot following exsanguination of the left foot and inflation of the tourniquet.  An approximate 4 cm linear incision was made coursing proximal to distal over the first metatarsal and metatarsophalangeal joint.  Incision was deepened via sharp and blunt dissection down to the level of the joint where a linear capsulotomy was performed and the capsular and periosteal tissues reflected off of the medial head of the first metatarsal.  Some degenerative and discolored changes were noted in the metatarsal head.  This was then resected using a sagittal saw and the edges were rasped smooth.  The bone was passed off the table to be sent for pathology.  The wound was then flushed with copious amounts of sterile saline and closed with 4-0 Vicryl running suture for all layers from periosteal through deep and superficial subcutaneous and skin closure.  Tincture of benzoin Steri-Strips Xeroform 4 x 4's and con form applied to the left foot.  Tourniquet was released and blood flow noted return immediately to the left foot and all digits.  Patient was awakened and transported to the PACU having tolerated the anesthesia and procedures well.

## 2020-04-15 NOTE — Anesthesia Procedure Notes (Signed)
Procedure Name: LMA Insertion Date/Time: 04/15/2020 9:03 AM Performed by: Chelsea Aus, CRNA Pre-anesthesia Checklist: Patient identified, Emergency Drugs available, Suction available and Patient being monitored Patient Re-evaluated:Patient Re-evaluated prior to induction Oxygen Delivery Method: Circle system utilized Preoxygenation: Pre-oxygenation with 100% oxygen Induction Type: IV induction LMA: LMA inserted LMA Size: 4.0 Number of attempts: 1 Placement Confirmation: positive ETCO2 and breath sounds checked- equal and bilateral Tube secured with: Tape Dental Injury: Teeth and Oropharynx as per pre-operative assessment  Comments: ambu aura straight LMA

## 2020-04-15 NOTE — Interval H&P Note (Signed)
History and Physical Interval Note:  04/15/2020 8:48 AM  Ariel Alvarez  has presented today for surgery, with the diagnosis of M89.8X9 EXOSTOSIS M20.11,M20.12  VALGUS DEFORMITY BILATERAL GREAT TOES.  The various methods of treatment have been discussed with the patient and family. After consideration of risks, benefits and other options for treatment, the patient has consented to  Procedure(s): METATARSAL HEAD PARTIAL EXCISION - BILATERAL (Bilateral) as a surgical intervention.  The patient's history has been reviewed, patient examined, no change in status, stable for surgery.  I have reviewed the patient's chart and labs.  Questions were answered to the patient's satisfaction.     Ricci Barker

## 2020-04-15 NOTE — OR Nursing (Signed)
Per Dr. Alberteen Spindle secure chat, pt may resume eliquis and aspirin tomorrow 04/16/20; also notified him pt states only uses pharmacy in [place] and not [place].  States he has cancelled the one sent to [facility].  Added all to discharge instructions.

## 2020-04-15 NOTE — H&P (Signed)
Subjective: Patient presents today for bilateral exostectomy of both great toe joints.  Objective: Hallux valgus formula bilateral with palpable bony prominence at the medial first metatarsal head bilateral.  Neurovascular status is grossly intact.  No open ulcerations or sign of any infection.  Assessment: Hallux valgus deformity with exostosis first metatarsal.  Plan: Medical history and physical in the chart reviewed.  Patient stable for surgery for bilateral exostectomy.

## 2020-04-15 NOTE — Discharge Instructions (Addendum)
1.  Elevate both lower extremities on pillows.  2.  Keep bandages on both feet clean, dry, and do not remove.  3.  Sponge bathe only both lower extremities.  4.  Wear surgical shoes whenever standing or transferring.  5.  Take 1 pill, oxycodone, every 4 hours only if needed for pain.  PRESCRIPTION SENT TO [place] HAS BEEN CANCELLED PER DR. CLINE.  YOU CAN PICK UP YOUR PAIN MED SCRIPT AT YOUR PHARMACY IN [place].

## 2020-04-15 NOTE — Anesthesia Postprocedure Evaluation (Signed)
Anesthesia Post Note  Patient: Ariel Alvarez  Procedure(s) Performed: METATARSAL HEAD PARTIAL EXCISION - BILATERAL (Bilateral Toe)  Patient location during evaluation: PACU Anesthesia Type: General Level of consciousness: awake and alert Pain management: pain level controlled Vital Signs Assessment: post-procedure vital signs reviewed and stable Respiratory status: spontaneous breathing and respiratory function stable Cardiovascular status: stable Anesthetic complications: no   No complications documented.   Last Vitals:  Vitals:   04/15/20 1043 04/15/20 1058  BP: (!) 126/55 125/68  Pulse: 72 72  Resp: 17 19  Temp:  (!) 36.1 C  SpO2: 98% 94%    Last Pain:  Vitals:   04/15/20 1058  TempSrc:   PainSc: 0-No pain                 Laurisa Sahakian K

## 2020-04-15 NOTE — Transfer of Care (Signed)
Immediate Anesthesia Transfer of Care Note  Patient: Ariel Alvarez  Procedure(s) Performed: METATARSAL HEAD PARTIAL EXCISION - BILATERAL (Bilateral Toe)  Patient Location: PACU  Anesthesia Type:General  Level of Consciousness: awake, alert , oriented and patient cooperative  Airway & Oxygen Therapy: Patient Spontanous Breathing  Post-op Assessment: Report given to RN and Post -op Vital signs reviewed and stable  Post vital signs: Reviewed and stable  Last Vitals:  Vitals Value Taken Time  BP 135/80 04/15/20 1028  Temp    Pulse 73 04/15/20 1029  Resp 11 04/15/20 1029  SpO2 95 % 04/15/20 1029  Vitals shown include unvalidated device data.  Last Pain:  Vitals:   04/15/20 0740  TempSrc: Tympanic  PainSc: 0-No pain         Complications: No complications documented.

## 2020-04-16 ENCOUNTER — Encounter: Payer: Self-pay | Admitting: Podiatry

## 2020-04-19 LAB — SURGICAL PATHOLOGY

## 2020-07-25 DIAGNOSIS — R21 Rash and other nonspecific skin eruption: Secondary | ICD-10-CM | POA: Insufficient documentation

## 2020-07-25 DIAGNOSIS — R350 Frequency of micturition: Secondary | ICD-10-CM | POA: Insufficient documentation

## 2020-09-08 ENCOUNTER — Other Ambulatory Visit: Payer: Self-pay

## 2020-09-08 ENCOUNTER — Ambulatory Visit
Admission: EM | Admit: 2020-09-08 | Discharge: 2020-09-08 | Disposition: A | Discharge status: Home / Self Care | Payer: Medicare PPO | Attending: Family Medicine | Admitting: Family Medicine

## 2020-09-08 DIAGNOSIS — H9202 Otalgia, left ear: Secondary | ICD-10-CM | POA: Diagnosis not present

## 2020-09-08 NOTE — ED Provider Notes (Signed)
[facility]    CSN: [id number] Arrival date & time: 09/08/20  0941  History   Chief Complaint Chief Complaint  Patient presents with   Otalgia    left   HPI 74 year old female presents with the above complaint.  Patient reports left ear pain which started on Monday.  She states that she noticed some "bleeding from the ear".  Patient reports that she feels like there is an area of swelling at the angle of the mandible.  No respiratory symptoms.  No relieving factors.  No other associated symptoms.  No other complaints.  Past Medical History:  Diagnosis Date   Anxiety    Arthritis    RIGHT HAND   Collagen vascular disease (HCC)    Complication of anesthesia    HARD TO WAKE UP AFTER  ONE SURGERY-PT STAETS SHE WAS GIVEN TOO MUCH ANESTHESIA   Depression    Dyspnea    VERY RARE WITH EXERTION   GERD (gastroesophageal reflux disease)    History of kidney stones    H/O   Hypothyroidism    Stroke (HCC) 4098,1191   X2-LEFT HAD PARALYZED     Patient Active Problem List   Diagnosis Date Noted   Hyperlipidemia 12/08/2019   Stroke (HCC) 12/08/2019   Ulcerated, foot, unspecified laterality, limited to breakdown of skin (HCC) 12/08/2019   Collagen vascular disease (HCC) 05/21/2017   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      Home Medications    Prior to Admission medications   Medication Sig Start Date End Date Taking? Authorizing Provider  acetaminophen (TYLENOL) 325 MG tablet Take 325 mg by mouth every 6 (six) hours as needed.  12/22/08  Yes [provider]  albuterol (VENTOLIN  HFA) 108 (90 Base) MCG/ACT inhaler Inhale 1-2 puffs into the lungs every 6 (six) hours as needed.  09/30/18  Yes [provider]  apixaban (ELIQUIS) 5 MG TABS tablet Take 5 mg by mouth 2 (two) times daily.  11/26/19  Yes [provider]  aspirin 81 MG EC tablet Take 81 mg by mouth daily.    Yes [provider]  atorvastatin (LIPITOR) 40 MG tablet Take 40 mg by mouth every morning.    Yes [provider]  cetirizine (ZYRTEC) 10 MG tablet Take 10 mg by mouth daily after lunch.    Yes [provider]  clonazePAM (KLONOPIN) 1 MG tablet Take 2 mg by mouth at bedtime.    Yes [provider]  diphenhydramine-acetaminophen (TYLENOL PM) 25-500 MG TABS tablet Take 1 tablet by mouth at bedtime.    Yes [provider]  gabapentin (NEURONTIN) 300 MG capsule Take 300 mg by mouth 2 (two) times daily. 2 TABS IN AM AND 2 TABS IN PM 03/12/17  Yes [provider]  hydroxychloroquine (PLAQUENIL) 200 MG tablet Take 400 mg by mouth daily.    Yes [provider]  levothyroxine (SYNTHROID, LEVOTHROID) 100 MCG tablet Take by mouth. 04/19/17 09/08/20 Yes [provider]  Lifitegrast (XIIDRA) 5 % SOLN Xiidra 5 % eye drops in a dropperette  PLACE 1 DROP IN OU  BID   Yes [provider]  lubiprostone (AMITIZA) 24 MCG capsule Take 24 mcg by mouth 2 (two) times daily with a meal.    Yes [provider]  nystatin (MYCOSTATIN/NYSTOP) 100000 UNIT/GM POWD Apply to the affected areas 2 to 3 times daily until healing is complete 08/02/15  Yes Renford Dills, NP  nystatin (NYSTATIN) powder Nystop 100,000 unit/gram topical powder  APPLY TO AFFECTED AREA 3 TIMES DAILY   Yes [provider]  omeprazole (PRILOSEC) 20 MG capsule Take 20 mg by mouth every morning.    Yes [provider]  pravastatin (PRAVACHOL) 20 MG tablet Take 20 mg by mouth daily.   Yes [provider]  PRAVASTATIN SODIUM PO Take by mouth.     Yes [provider]  tiZANidine (ZANAFLEX) 4 MG capsule Take 4 mg by mouth 3 (three) times daily.    Yes [provider]  venlafaxine XR (EFFEXOR-XR) 150 MG 24 hr capsule venlafaxine hcl er 150 mg cp24   Yes [provider]  atorvastatin (LIPITOR) 80 MG tablet atorvastatin calcium 80 mg tabs Patient not taking: Reported on 04/14/2020    [provider]  cetirizine (ZYRTEC) 10 MG tablet TAKE 1 TABLET EVERY DAY Patient not taking: Reported on 04/14/2020 03/07/15   [provider]  clonazePAM (KLONOPIN) 1 MG tablet clonazepam 1 mg tabs Patient not taking: Reported on 04/14/2020    [provider]  eszopiclone (LUNESTA) 2 MG TABS tablet Take 2 mg by mouth at bedtime as needed for sleep. Take immediately before bedtime Patient not taking: Reported on 04/14/2020    [provider]  levothyroxine (SYNTHROID, LEVOTHROID) 88 MCG tablet Take 88 mcg by mouth daily before breakfast.    [provider]  oxyCODONE (ROXICODONE) 5 MG immediate release tablet Take 1 tablet (5 mg total) by mouth every 4 (four) hours as needed. 04/15/20 04/15/21  Linus Galas, DPM  oxyCODONE (ROXICODONE) 5 MG immediate release tablet Take 1 tablet (5 mg total) by mouth every 4 (four) hours as needed. 04/15/20 04/15/21  Linus Galas, DPM  phentermine 30 MG capsule Take 30 mg by mouth every morning.    [provider]  venlafaxine (EFFEXOR) 50 MG tablet Take 50 mg by mouth 2 (two) times daily. Patient not taking: Reported on 04/14/2020    [provider]  venlafaxine (EFFEXOR) 75 MG tablet Take 225 mg by mouth every morning.     [provider]  warfarin (COUMADIN) 2.5 MG tablet Take 2.5 mg by mouth. Patient not taking: Reported on 04/14/2020    [provider]  warfarin (COUMADIN) 3 MG tablet Take 3 mg by mouth daily. Patient not taking: Reported on 04/14/2020    [provider]    Family History Family History  Problem  Relation Age of Onset   Diabetes Son    Cancer Paternal Aunt    Stroke Paternal Grandmother     Social History Social History   Tobacco Use   Smoking status: Former Smoker    Packs/day: 1.00    Years: 20.00    Pack years: 20.00    Types: Cigarettes    Quit date: 10/29/1974    Years since quitting: 45.8   Smokeless tobacco: Never Used  Vaping Use   Vaping Use: Never used  Substance Use Topics   Alcohol use: Yes    Comment: WINE OCC   Drug use: No     Allergies   Latex and Prednisone   Review of Systems  Review of Systems  Constitutional: Negative.   HENT: Positive for ear pain.    Physical Exam Triage Vital Signs ED Triage Vitals  Enc Vitals Group     BP 09/08/20 1004 128/69     Pulse Rate 09/08/20 1004 83     Resp 09/08/20 1004 18     Temp 09/08/20 1004 98.9 F (37.2 C)     Temp Source 09/08/20 1004 Oral     SpO2 09/08/20 1004 93 %     Weight 09/08/20 0959 259 lb 14.8 oz (117.9 kg)     Height 09/08/20 0959 5\' 3"  (1.6 m)     Head Circumference --      Peak Flow --      Pain Score 09/08/20 0959 2     Pain Loc --      Pain Edu? --      Excl. in GC? --    Updated Vital Signs BP 128/69 (BP Location: Left Arm)    Pulse 83    Temp 98.9 F (37.2 C) (Oral)    Resp 18    Ht 5\' 3"  (1.6 m)    Wt 117.9 kg    SpO2 93%    BMI 46.04 kg/m   Visual Acuity Right Eye Distance:   Left Eye Distance:   Bilateral Distance:    Right Eye Near:   Left Eye Near:    Bilateral Near:     Physical Exam Vitals and nursing note reviewed.  Constitutional:      General: She is not in acute distress.    Appearance: Normal appearance. She is not ill-appearing.  HENT:     Head: Normocephalic and atraumatic.     Right Ear: Tympanic membrane and ear canal normal.     Left Ear: Tympanic membrane and ear canal normal.  Cardiovascular:     Rate and Rhythm: Normal rate.  Pulmonary:     Effort: Pulmonary effort is normal. No respiratory distress.  Neurological:     Mental  Status: She is alert.  Psychiatric:        Mood and Affect: Mood normal.        Behavior: Behavior normal.    UC Treatments / Results  Labs (all labs ordered are listed, but only abnormal results are displayed) Labs Reviewed - No data to display  EKG   Radiology No results found.  Procedures Procedures (including critical care time)  Medications Ordered in UC Medications - No data to display  Initial Impression / Assessment and Plan / UC Course  I have reviewed the triage vital signs and the nursing notes.  Pertinent labs & imaging results that were available during my care of the patient were reviewed by me and considered in my medical decision making (see chart for details).    74 year old female presents with left ear pain.  Normal exam today.  Advised supportive care.  ENT if needed.  Final Clinical Impressions(s) / UC Diagnoses   Final diagnoses:  Otalgia of left ear   Discharge Instructions   None    ED Prescriptions    None     PDMP not reviewed this encounter.   , 62 09/08/20 1041

## 2020-09-08 NOTE — ED Triage Notes (Signed)
Patient states that she has been having left ear pain that started on Monday and she noticed bleeding from her ear. States that she has been having pain radiate down her neck as well.

## 2020-11-10 ENCOUNTER — Other Ambulatory Visit: Payer: Self-pay | Admitting: Internal Medicine

## 2020-11-10 DIAGNOSIS — I69354 Hemiplegia and hemiparesis following cerebral infarction affecting left non-dominant side: Secondary | ICD-10-CM

## 2020-11-10 DIAGNOSIS — R1031 Right lower quadrant pain: Secondary | ICD-10-CM

## 2020-12-19 DIAGNOSIS — R269 Unspecified abnormalities of gait and mobility: Secondary | ICD-10-CM | POA: Insufficient documentation

## 2021-03-28 ENCOUNTER — Ambulatory Visit
Admission: EM | Admit: 2021-03-28 | Discharge: 2021-03-28 | Disposition: A | Discharge status: Home / Self Care | Payer: Medicare PPO

## 2021-03-28 ENCOUNTER — Other Ambulatory Visit: Payer: Self-pay

## 2021-03-28 DIAGNOSIS — B379 Candidiasis, unspecified: Secondary | ICD-10-CM

## 2021-03-28 DIAGNOSIS — L0390 Cellulitis, unspecified: Secondary | ICD-10-CM

## 2021-03-28 MED ORDER — DOXYCYCLINE HYCLATE 100 MG PO CAPS
100.0000 mg | ORAL_CAPSULE | Freq: Two times a day (BID) | ORAL | 0 refills | Status: DC
Start: 2021-03-28 — End: 2021-05-16

## 2021-03-28 NOTE — ED Triage Notes (Signed)
Pt c/o rash to her lower abdomen area. Pt states the rash has been present for years off and on. Pt states the area has flared up since yesterday and is very painful to the touch. Pt has been applying nystatin and diclofenac creams to the area with no improvement. Pt has not seem dermatology, she states her PCP has given her antibiotics in the past. Pt denies f/n/v/d or other symptoms.

## 2021-03-28 NOTE — Discharge Instructions (Addendum)
Keep the area clean and dry and after applying the Nystatin powder and 100% cotton cloth under abdominal flap to keep area dry

## 2021-05-16 ENCOUNTER — Ambulatory Visit
Admission: EM | Admit: 2021-05-16 | Discharge: 2021-05-16 | Disposition: A | Discharge status: Home / Self Care | Payer: Medicare PPO | Attending: Family Medicine | Admitting: Family Medicine

## 2021-05-16 ENCOUNTER — Encounter: Payer: Self-pay | Admitting: Emergency Medicine

## 2021-05-16 ENCOUNTER — Other Ambulatory Visit: Payer: Self-pay

## 2021-05-16 DIAGNOSIS — N760 Acute vaginitis: Secondary | ICD-10-CM | POA: Insufficient documentation

## 2021-05-16 DIAGNOSIS — R300 Dysuria: Secondary | ICD-10-CM | POA: Diagnosis present

## 2021-05-16 DIAGNOSIS — R350 Frequency of micturition: Secondary | ICD-10-CM | POA: Diagnosis present

## 2021-05-16 LAB — URINALYSIS, COMPLETE (UACMP) WITH MICROSCOPIC
Bilirubin Urine: NEGATIVE
Glucose, UA: NEGATIVE mg/dL
Hgb urine dipstick: NEGATIVE
Ketones, ur: NEGATIVE mg/dL
Leukocytes,Ua: NEGATIVE
Nitrite: NEGATIVE
Protein, ur: NEGATIVE mg/dL
Specific Gravity, Urine: 1.02 (ref 1.005–1.030)
pH: 6.5 (ref 5.0–8.0)

## 2021-05-16 LAB — GLUCOSE, CAPILLARY: Glucose-Capillary: 88 mg/dL (ref 70–99)

## 2021-05-16 MED ORDER — FLUCONAZOLE 150 MG PO TABS: ORAL_TABLET | ORAL | 0 refills | Status: DC

## 2021-05-16 MED ORDER — CLOTRIMAZOLE 1 % EX CREA: TOPICAL_CREAM | CUTANEOUS | 1 refills | Status: DC

## 2021-05-16 NOTE — Discharge Instructions (Addendum)
Your urine test is normal.  I will send the urine for culture and you will be contacted if it does grow bacteria and we will treat you for UTI but does not appear to be consistent with a UTI at this time.  Also, you have recently had antibiotics for suspected UTI anyway.  I suspect that you have a vaginal yeast infection causing your symptoms.  I have sent a cream and also pills for this.  Increase your rest and fluids.  Follow-up with your primary care provider if you are not feeling better in the next few days.

## 2021-05-16 NOTE — ED Provider Notes (Signed)
[facility]    CSN: [id number] Arrival date & time: 05/16/21  1613      History   Chief Complaint Chief Complaint  Patient presents with   Urinary Frequency    HPI Ariel Alvarez is a 75 y.o. female presenting with approximately 3-week history of dysuria and increased urinary frequency.  Patient also admits to red rash in her groin area and under her pannus.  Additionally she says she has some itching in these areas.  Patient did contact her PCPs office 2 weeks ago and was prescribed Macrobid and Diflucan.  She states she feels like that helped her symptoms but now they have gotten a little worse.  She denies any fever, fatigue, lower abdominal pain or back pain.  No vaginal discharge or hematuria.  Patient does have a history of diabetes. 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  No other complaints or concerns.  HPI  Past Medical History:  Diagnosis Date   Anxiety    Arthritis    RIGHT HAND   Collagen vascular disease (HCC)    Complication of anesthesia    HARD TO WAKE UP AFTER  ONE SURGERY-PT STAETS SHE WAS GIVEN TOO MUCH ANESTHESIA   Depression    Dyspnea    VERY RARE WITH EXERTION   GERD (gastroesophageal reflux disease)    History of kidney stones    H/O   Hypothyroidism    Stroke (HCC) 5784,6962   X2-LEFT HAD PARALYZED     Patient Active Problem List   Diagnosis Date Noted   Hyperlipidemia 12/08/2019   Stroke (HCC) 12/08/2019   Ulcerated, foot, unspecified laterality, limited to breakdown of skin (HCC) 12/08/2019   Collagen vascular disease (HCC) 05/21/2017   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      Home Medications    Prior to Admission medications   Medication Sig Start Date End Date Taking? Authorizing Provider  acetaminophen (TYLENOL) 325 MG tablet Take 325 mg by mouth every 6 (six) hours as needed.  12/22/08  Yes [provider]  albuterol (VENTOLIN HFA) 108 (90 Base) MCG/ACT inhaler Inhale 1-2 puffs into the lungs every 6 (six) hours as needed.  09/30/18  Yes [provider]  apixaban (ELIQUIS) 5 MG TABS tablet Take 5 mg by mouth 2 (two) times daily.  11/26/19  Yes [provider]  aspirin 81 MG EC tablet Take 81 mg by mouth daily.    Yes [provider]  cetirizine (ZYRTEC) 10 MG tablet Take 10 mg by mouth daily after lunch.    Yes [provider]  clonazePAM (KLONOPIN) 1 MG tablet Take 2 mg by mouth at bedtime.    Yes [provider]  clotrimazole (LOTRIMIN) 1 % cream Apply to affected area 2 times daily 05/16/21  Yes Shirlee Latch, PA-C  fluconazole (DIFLUCAN) 150 MG tablet Take 1 tab PO q72h PRN yeast infection 05/16/21  Yes Shirlee Latch, PA-C  folic acid (FOLVITE) 1 MG tablet Take by mouth. 08/23/20  Yes [provider]  gabapentin (NEURONTIN) 300 MG capsule  Take 300 mg by mouth 2 (two) times daily. 2 TABS IN AM AND 2 TABS IN PM 03/12/17  Yes [provider]  hydroxychloroquine (PLAQUENIL) 200 MG tablet Take 400 mg by mouth daily.    Yes [provider]  levothyroxine (SYNTHROID, LEVOTHROID) 100 MCG tablet Take by mouth. 04/19/17 05/16/21 Yes [provider]  metFORMIN (GLUCOPHAGE) 500 MG tablet Take 1 tablet by mouth 2 (two) times daily. 02/13/21  Yes [provider]  methotrexate (RHEUMATREX) 2.5 MG tablet Take by mouth. 03/13/21  Yes [provider]  omeprazole (PRILOSEC) 20 MG capsule Take 20 mg by mouth every morning.    Yes [provider]  pravastatin (PRAVACHOL) 20 MG tablet Take 20 mg by mouth daily.   Yes [provider]  traZODone (DESYREL) 50 MG tablet Take 25 mg by mouth at bedtime as needed. 03/23/21  Yes [provider]  venlafaxine XR (EFFEXOR-XR) 150 MG 24 hr capsule venlafaxine hcl er 150 mg cp24   Yes [provider]  atorvastatin (LIPITOR) 40 MG tablet Take 40 mg by mouth every morning.     [provider]  diphenhydramine-acetaminophen (TYLENOL PM) 25-500 MG TABS tablet Take 1 tablet by mouth at bedtime.     [provider]  Lifitegrast 5 % SOLN Xiidra 5 % eye drops in a dropperette  PLACE 1 DROP IN OU BID    [provider]  lubiprostone (AMITIZA) 24 MCG capsule Take 24 mcg by mouth 2 (two) times daily with a meal.     [provider]  nystatin (MYCOSTATIN/NYSTOP) 100000 UNIT/GM POWD Apply to the affected areas 2 to 3 times daily until healing is complete 08/02/15   Renford Dills, NP  nystatin (MYCOSTATIN/NYSTOP) powder Nystop 100,000 unit/gram topical powder  APPLY TO AFFECTED AREA 3 TIMES DAILY    [provider]  pantoprazole (PROTONIX) 40 MG tablet Take 1 tablet by mouth daily. 12/26/20   [provider]  PRAVASTATIN SODIUM PO Take by mouth.     [provider]  tiZANidine (ZANAFLEX) 4 MG capsule Take 4 mg by mouth 3 (three) times daily.     [provider]  eszopiclone (LUNESTA) 2 MG TABS tablet Take 2 mg by mouth at bedtime as needed for sleep. Take immediately before bedtime Patient not taking: Reported on 04/14/2020  09/08/20  [provider]  phentermine 30 MG capsule Take 30 mg by mouth every morning.  09/08/20  [provider]  warfarin (COUMADIN) 2.5 MG tablet Take 2.5 mg by mouth. Patient not taking: Reported on 04/14/2020  09/08/20  [provider]    Family History Family History  Problem Relation Age of Onset   Diabetes Son    Cancer Paternal Aunt    Stroke Paternal Grandmother     Social History Social History   Tobacco Use   Smoking status:  Former    Packs/day: 1.00    Years: 20.00    Pack years: 20.00    Types: Cigarettes    Quit date: 10/29/1974    Years since quitting: 46.5   Smokeless tobacco: Never  Vaping Use   Vaping Use: Never used  Substance Use Topics   Alcohol use: Yes    Comment: WINE OCC   Drug use: No     Allergies   Latex and Prednisone   Review of Systems Review of Systems  Constitutional:  Negative for fatigue and fever.  HENT:  Negative for sore throat.   Respiratory:  Negative  for cough and shortness of breath.   Cardiovascular:  Negative for chest pain.  Gastrointestinal:  Negative for abdominal pain, nausea and vomiting.  Genitourinary:  Positive for dysuria. Negative for flank pain, frequency, hematuria, urgency, vaginal bleeding, vaginal discharge and vaginal pain.  Musculoskeletal:  Negative for back pain.  Skin:  Positive for rash.    Physical Exam Triage Vital Signs ED Triage Vitals  Enc Vitals Group     BP 05/16/21 1709 134/64     Pulse Rate 05/16/21 1709 83     Resp 05/16/21 1709 18     Temp 05/16/21 1709 98.6 F (37 C)     Temp Source 05/16/21 1709 Oral     SpO2 05/16/21 1709 93 %     Weight 05/16/21 1705 268 lb 1.3 oz (121.6 kg)     Height 05/16/21 1705 5\' 3"  (1.6 m)     Head Circumference --      Peak Flow --      Pain Score 05/16/21 1704 5     Pain Loc --      Pain Edu? --      Excl. in GC? --    No data found.  Updated Vital Signs BP 134/64 (BP Location: Right Arm)   Pulse 83   Temp 98.6 F (37 C) (Oral)   Resp 18   Ht 5\' 3"  (1.6 m)   Wt 268 lb 1.3 oz (121.6 kg)   SpO2 93%   BMI 47.49 kg/m      Physical Exam Vitals and nursing note reviewed.  Constitutional:      General: She is not in acute distress.    Appearance: Normal appearance. She is obese. She is not ill-appearing or toxic-appearing.     Comments: Wheelchair bound, chronic left sided weakness  HENT:     Head: Normocephalic and atraumatic.  Eyes:     General: No scleral icterus.        Right eye: No discharge.        Left eye: No discharge.     Conjunctiva/sclera: Conjunctivae normal.  Cardiovascular:     Rate and Rhythm: Normal rate and regular rhythm.     Heart sounds: Normal heart sounds.  Pulmonary:     Effort: Pulmonary effort is normal. No respiratory distress.     Breath sounds: Normal breath sounds. No wheezing, rhonchi or rales.  Musculoskeletal:     Cervical back: Neck supple.  Skin:    General: Skin is dry.  Neurological:     Mental Status: She is alert. Mental status is at baseline.  Psychiatric:        Mood and Affect: Mood normal.        Behavior: Behavior normal.        Thought Content: Thought content normal.     UC Treatments / Results  Labs (all labs ordered are listed, but only abnormal results are displayed) Labs Reviewed  URINALYSIS, COMPLETE (UACMP) WITH MICROSCOPIC - Abnormal; Notable for the following components:      Result Value   Bacteria, UA FEW (*)    All other components within normal limits  URINE CULTURE  GLUCOSE, CAPILLARY  CBG MONITORING, ED    EKG   Radiology No results found.  Procedures Procedures (including critical care time)  Medications Ordered in UC Medications - No data to display  Initial Impression / Assessment and Plan / UC Course  I have reviewed the triage vital signs and the nursing notes.  Pertinent labs &  imaging results that were available during my care of the patient were reviewed by me and considered in my medical decision making (see chart for details).  75 year old female presenting with her husband today for dysuria, rash of the groin region and urinary frequency over the past 3 weeks.  Vitals are stable but oxygen is decreased at 93%.  Oxygen was checked on nails that have a gel.  Patient's husband states that she always has low oxygen.  Patient denies any chest pain or shortness of breath.  Her lungs are clear to auscultation on exam.  They declined further work-up for the decreased  oxygen saturation.  No abdominal tenderness or CVA tenderness.  I did advise a GU exam but patient declined stating she was not comfortable with this.  Urinalysis today is within normal limits.  We will send for culture.  Patient does not want to perform vaginal swab.  Glucose is 88.  Treating patient at this time for suspected vaginal and skin yeast infection with Diflucan and clotrimazole.  Also advised increasing rest and fluids.  We will treat for UTI if her culture is positive.  Advised patient to call 911 or have her husband take her to the ED if she develops any chest pain or shortness of breath.  Follow-up with PCP regarding the low oxygen saturation.  Final Clinical Impressions(s) / UC Diagnoses   Final diagnoses:  Acute vaginitis  Dysuria  Urinary frequency     Discharge Instructions      Your urine test is normal.  I will send the urine for culture and you will be contacted if it does grow bacteria and we will treat you for UTI but does not appear to be consistent with a UTI at this time.  Also, you have recently had antibiotics for suspected UTI anyway.  I suspect that you have a vaginal yeast infection causing your symptoms.  I have sent a cream and also pills for this.  Increase your rest and fluids.  Follow-up with your primary care provider if you are not feeling better in the next few days.   ED Prescriptions     Medication Sig Dispense Auth. Provider   fluconazole (DIFLUCAN) 150 MG tablet Take 1 tab PO q72h PRN yeast infection 2 tablet Eusebio Friendly B, PA-C   clotrimazole (LOTRIMIN) 1 % cream Apply to affected area 2 times daily 28 g Shirlee Latch, PA-C      PDMP not reviewed this encounter.   Shirlee Latch, PA-C 05/16/21 1843

## 2021-05-16 NOTE — ED Triage Notes (Signed)
Pt c/o urinary frequency and dysuria. She states it has been going on for a while but got worse yesterday. She states she felt like something hit her bladder like a rock and has worsening symptoms since.

## 2021-05-18 LAB — URINE CULTURE: Culture: 30000 — AB

## 2021-05-23 ENCOUNTER — Other Ambulatory Visit: Payer: Self-pay | Admitting: Internal Medicine

## 2021-05-23 DIAGNOSIS — Z1231 Encounter for screening mammogram for malignant neoplasm of breast: Secondary | ICD-10-CM

## 2021-05-28 IMAGING — DX DG FOREARM 2V*L*
2 series · 3 of 3 positions shown · non-contrast
Comparison: None.

CLINICAL DATA: Fell onto left arm, pain

EXAM:
LEFT FOREARM - 2 VIEW

[Series 1: forearm ap · 0.14mm/px · 2 of 2 slices shown]
[im 1/2]
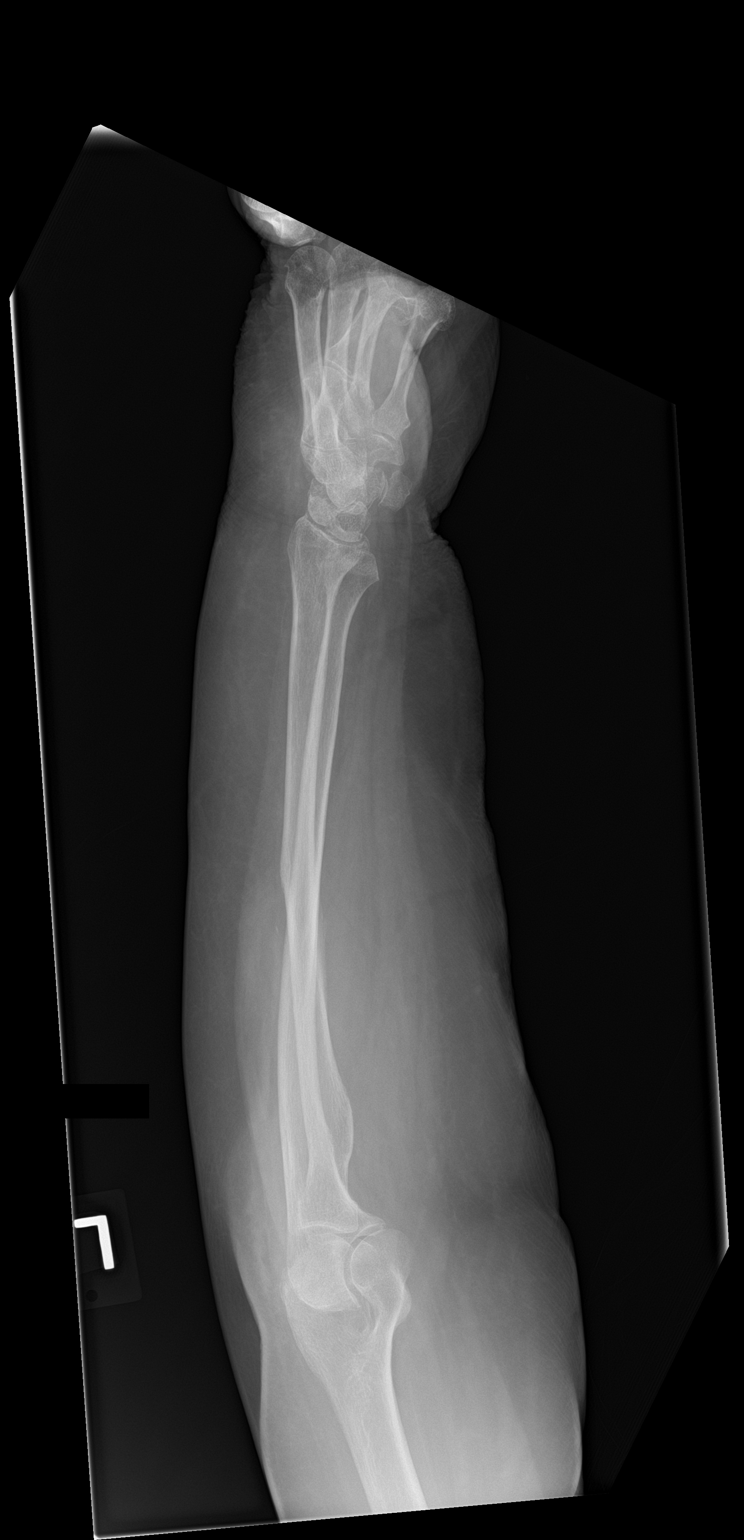
[im 2/2]
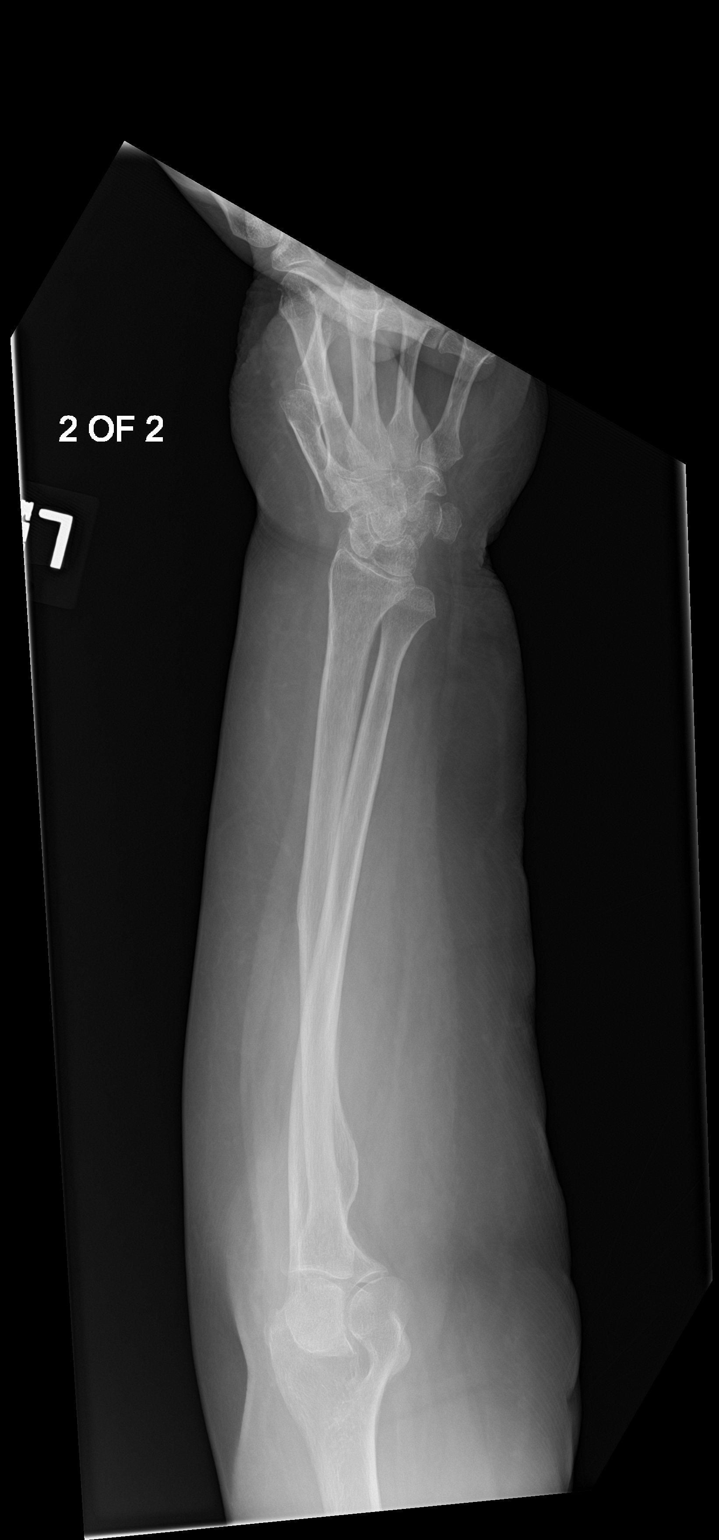

[forearm lat]
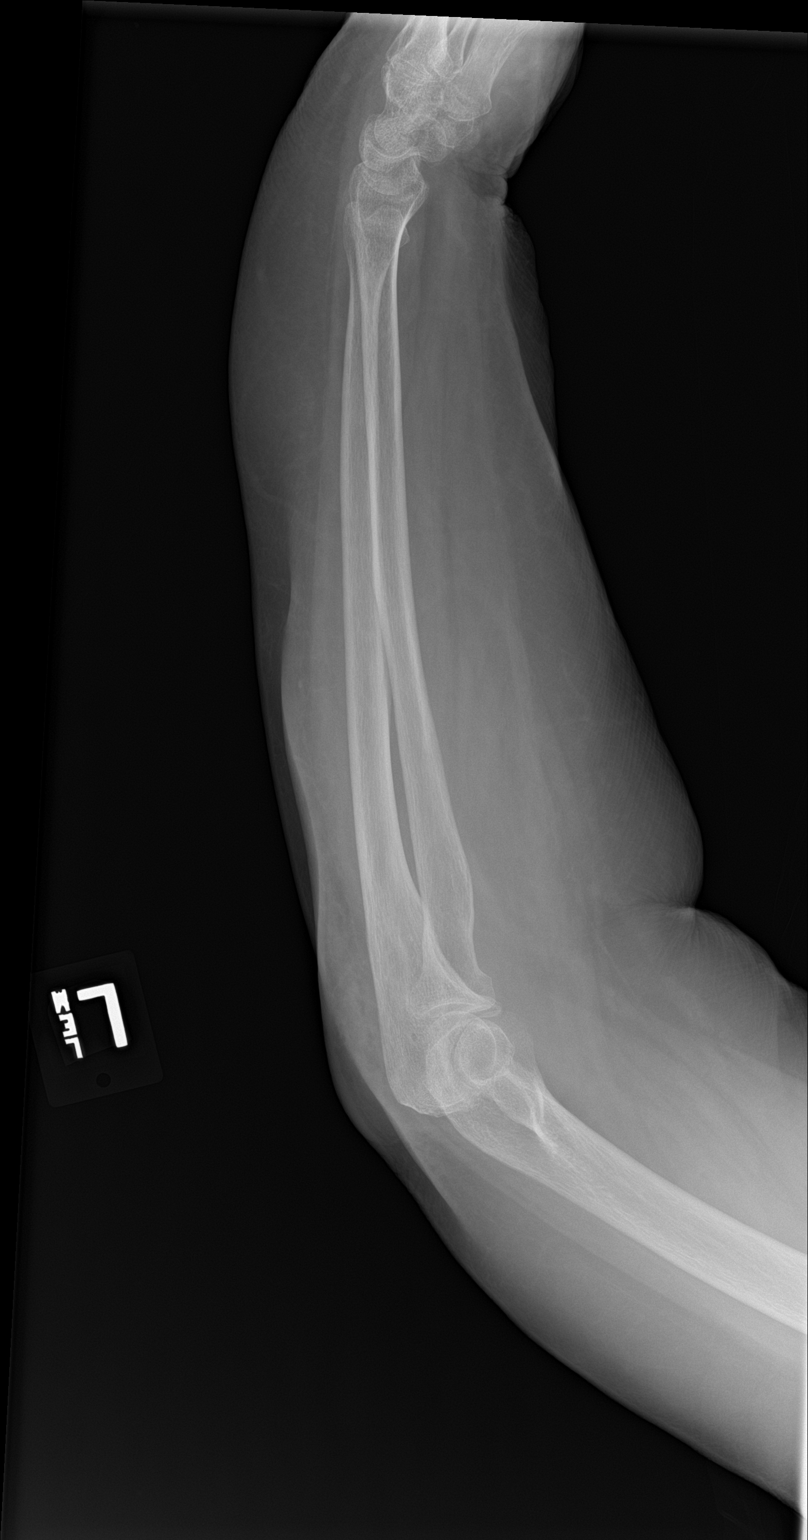

[3 of 3 positions shown; findings below may reference images not displayed]

FINDINGS: Frontal and lateral views of the left forearm are obtained. There is
a prior healed mid radial diaphyseal fracture. No acute displaced
fracture. The wrist and elbow are well aligned. Dorsal soft tissue
swelling of the distal forearm is noted.
IMPRESSION: 1. Prior healed mid right radial diaphyseal fracture.
2. No acute bony abnormality.

## 2021-05-28 IMAGING — DX DG HUMERUS 2V *L*
2 series · 2 of 2 positions shown · non-contrast
Comparison: 03/02/2017

CLINICAL DATA: Fell onto left arm, pain

EXAM:
LEFT HUMERUS - 2+ VIEW

[humerus ap]
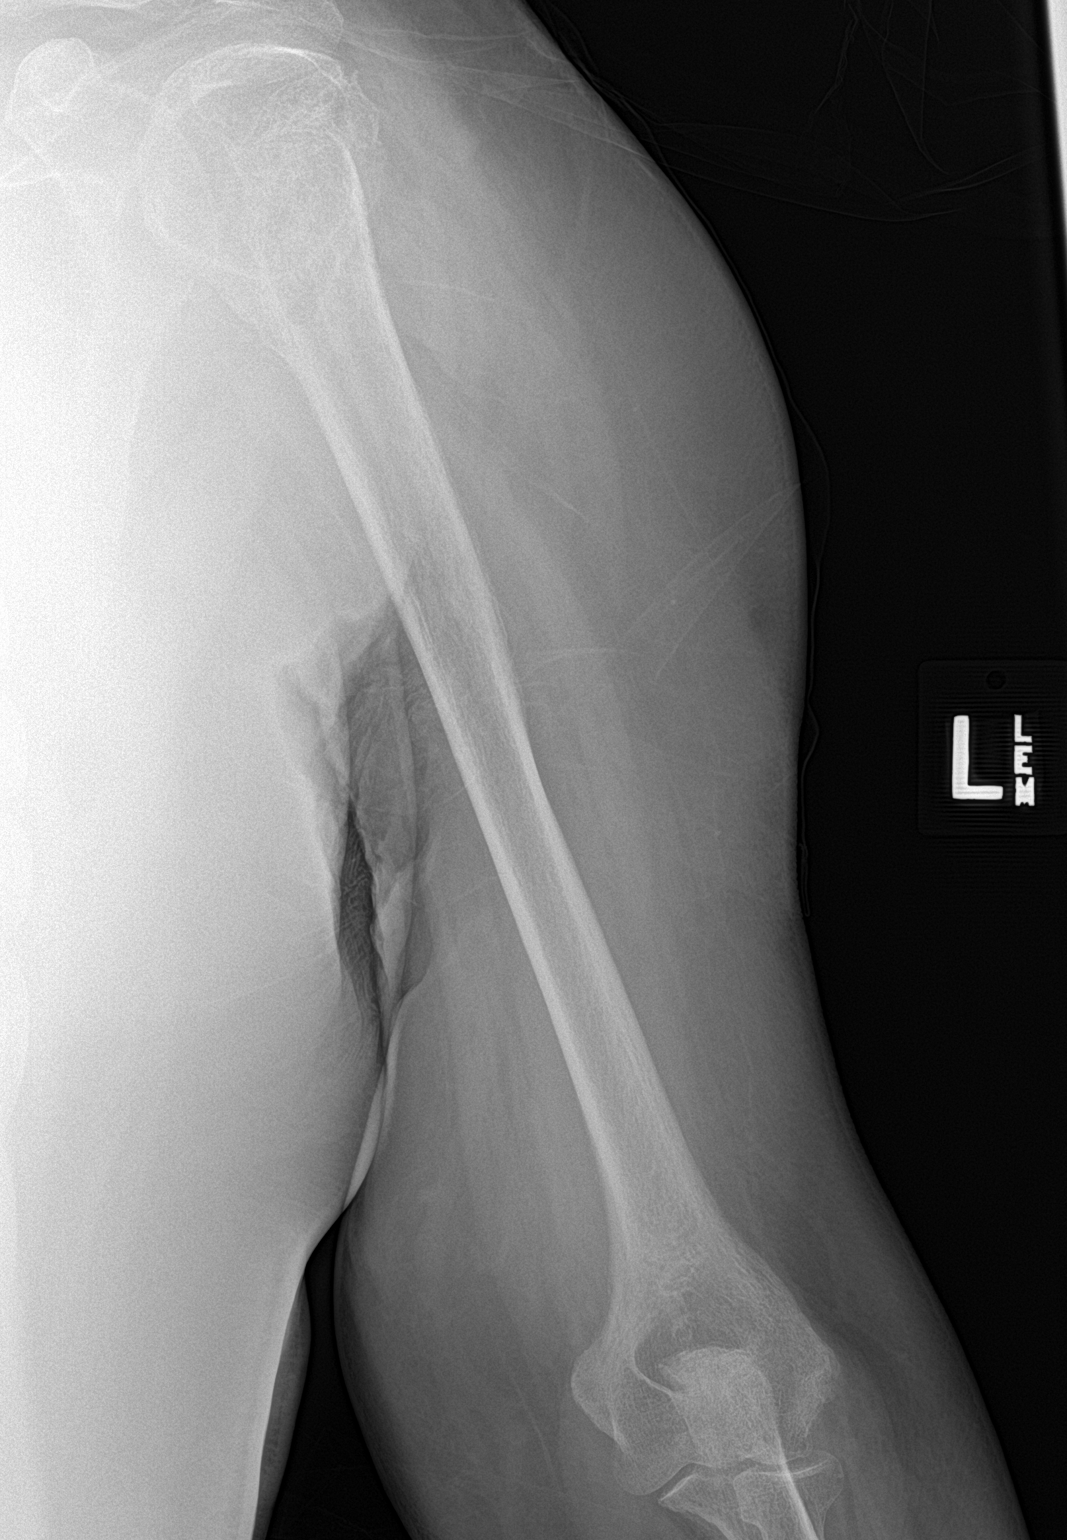

[humerus lat]
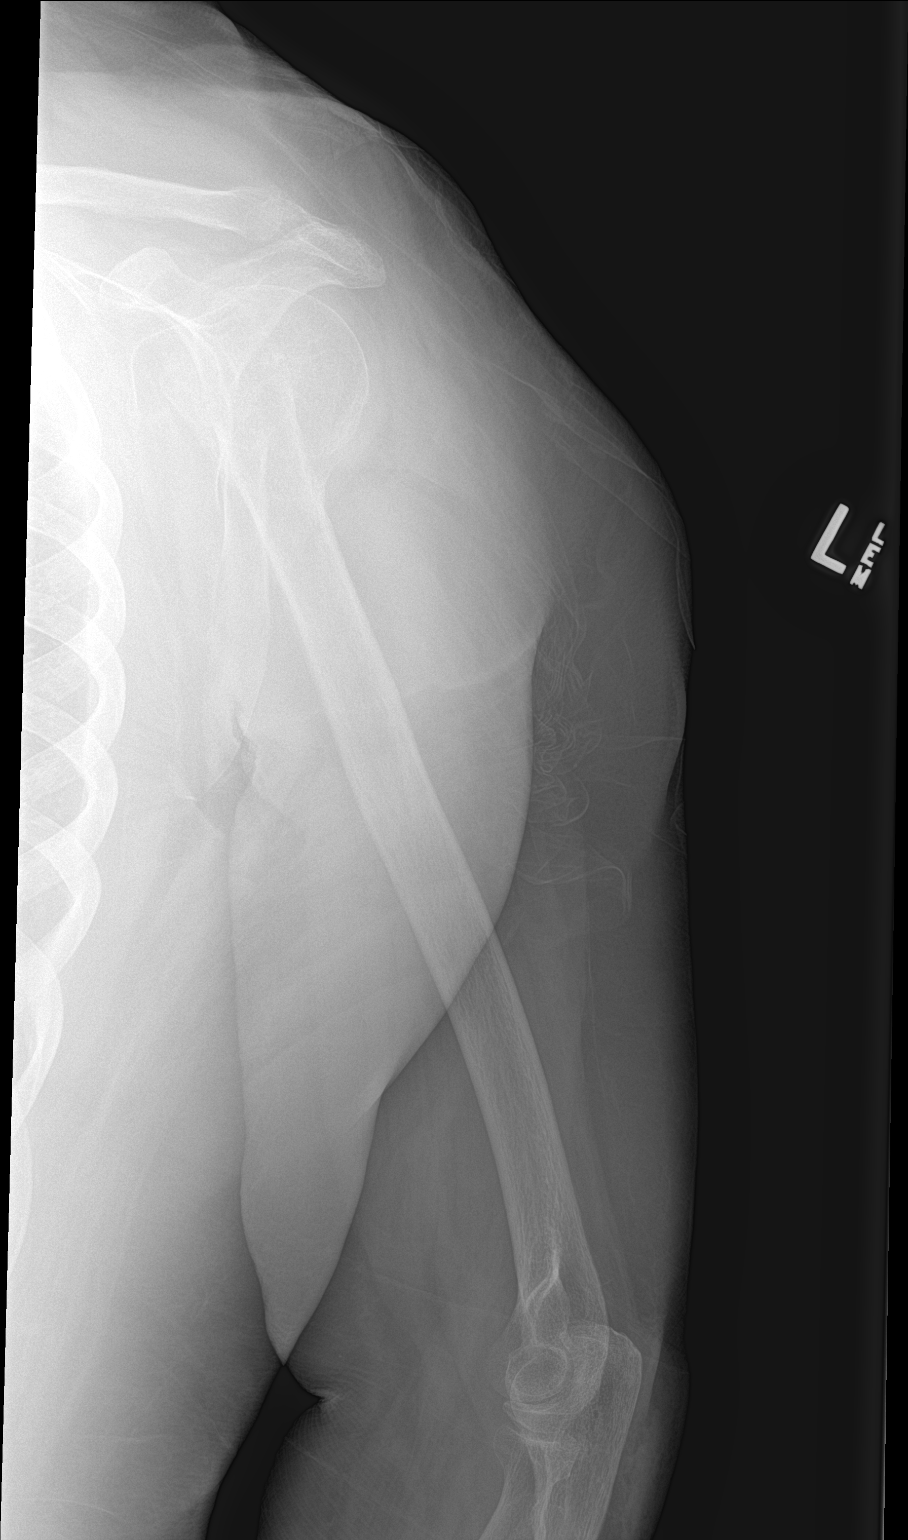

[2 of 2 positions shown; findings below may reference images not displayed]

FINDINGS: Frontal and lateral views of the left humerus demonstrate a prior
healed left humeral neck fracture. No acute displaced fracture. Left
shoulder and elbow appear well aligned.
IMPRESSION: 1. Prior healed left humeral neck fracture. No acute bony
abnormality.

## 2021-06-06 ENCOUNTER — Emergency Department
Admission: EM | Admit: 2021-06-06 | Discharge: 2021-06-06 | Disposition: A | Discharge status: Home / Self Care | Payer: Medicare PPO | Attending: Emergency Medicine | Admitting: Emergency Medicine

## 2021-06-06 ENCOUNTER — Other Ambulatory Visit: Payer: Self-pay

## 2021-06-06 ENCOUNTER — Encounter: Payer: Self-pay | Admitting: *Deleted

## 2021-06-06 DIAGNOSIS — R1031 Right lower quadrant pain: Secondary | ICD-10-CM | POA: Insufficient documentation

## 2021-06-06 DIAGNOSIS — Z5321 Procedure and treatment not carried out due to patient leaving prior to being seen by health care provider: Secondary | ICD-10-CM | POA: Diagnosis not present

## 2021-06-06 DIAGNOSIS — R110 Nausea: Secondary | ICD-10-CM | POA: Diagnosis not present

## 2021-06-06 LAB — URINALYSIS, COMPLETE (UACMP) WITH MICROSCOPIC
Bacteria, UA: NONE SEEN
Bilirubin Urine: NEGATIVE
Glucose, UA: NEGATIVE mg/dL
Hgb urine dipstick: NEGATIVE
Ketones, ur: NEGATIVE mg/dL
Nitrite: NEGATIVE
Protein, ur: NEGATIVE mg/dL
Specific Gravity, Urine: 1.026 (ref 1.005–1.030)
pH: 5 (ref 5.0–8.0)

## 2021-06-06 LAB — CBC
HCT: 40.6 % (ref 36.0–46.0)
Hemoglobin: 13.2 g/dL (ref 12.0–15.0)
MCH: 31.8 pg (ref 26.0–34.0)
MCHC: 32.5 g/dL (ref 30.0–36.0)
MCV: 97.8 fL (ref 80.0–100.0)
Platelets: 281 10*3/uL (ref 150–400)
RBC: 4.15 MIL/uL (ref 3.87–5.11)
RDW: 15.4 % (ref 11.5–15.5)
WBC: 10.7 10*3/uL — ABNORMAL HIGH (ref 4.0–10.5)
nRBC: 0 % (ref 0.0–0.2)

## 2021-06-06 LAB — COMPREHENSIVE METABOLIC PANEL
ALT: 19 U/L (ref 0–44)
AST: 29 U/L (ref 15–41)
Albumin: 3.9 g/dL (ref 3.5–5.0)
Alkaline Phosphatase: 81 U/L (ref 38–126)
Anion gap: 6 (ref 5–15)
BUN: 8 mg/dL (ref 8–23)
CO2: 28 mmol/L (ref 22–32)
Calcium: 8.8 mg/dL — ABNORMAL LOW (ref 8.9–10.3)
Chloride: 107 mmol/L (ref 98–111)
Creatinine, Ser: 0.67 mg/dL (ref 0.44–1.00)
GFR, Estimated: >60 mL/min (ref >60)
Glucose, Bld: 147 mg/dL — ABNORMAL HIGH (ref 70–99)
Potassium: 4 mmol/L (ref 3.5–5.1)
Sodium: 141 mmol/L (ref 135–145)
Total Bilirubin: 0.6 mg/dL (ref 0.3–1.2)
Total Protein: 7.1 g/dL (ref 6.5–8.1)

## 2021-06-06 LAB — LIPASE, BLOOD: Lipase: 37 U/L (ref 11–51)

## 2021-06-06 NOTE — ED Triage Notes (Signed)
Pt stating right lower abdominal pain that radiates to the mid lower abdomen. Pain has been intermittent for about 2 months, constant today. Nausea. Pt says she feels like she has to urinate, but only going small amounts.

## 2021-06-06 NOTE — ED Triage Notes (Signed)
Pt arrives from home via [facility], per medic report, the pt is having sharp abdominal pain for 2 months, generalized. Hx of kidney stones. 138/82, 95% ra, hr 98

## 2021-06-06 NOTE — ED Notes (Signed)
Notified by lobby staff [facility] that pt has left

## 2021-06-16 ENCOUNTER — Other Ambulatory Visit (HOSPITAL_COMMUNITY): Payer: Self-pay | Admitting: Internal Medicine

## 2021-06-16 ENCOUNTER — Other Ambulatory Visit: Payer: Self-pay | Admitting: Internal Medicine

## 2021-06-16 DIAGNOSIS — R404 Transient alteration of awareness: Secondary | ICD-10-CM

## 2021-07-04 ENCOUNTER — Ambulatory Visit: Admission: RE | Admit: 2021-07-04 | Payer: Medicare PPO | Source: Ambulatory Visit

## 2021-07-10 ENCOUNTER — Emergency Department: Payer: Medicare PPO

## 2021-07-10 ENCOUNTER — Emergency Department
Admission: EM | Admit: 2021-07-10 | Discharge: 2021-07-10 | Disposition: A | Discharge status: Home / Self Care | Payer: Medicare PPO | Attending: Emergency Medicine | Admitting: Emergency Medicine

## 2021-07-10 ENCOUNTER — Other Ambulatory Visit: Payer: Self-pay

## 2021-07-10 DIAGNOSIS — E039 Hypothyroidism, unspecified: Secondary | ICD-10-CM | POA: Insufficient documentation

## 2021-07-10 DIAGNOSIS — Z7982 Long term (current) use of aspirin: Secondary | ICD-10-CM | POA: Insufficient documentation

## 2021-07-10 DIAGNOSIS — N200 Calculus of kidney: Secondary | ICD-10-CM | POA: Insufficient documentation

## 2021-07-10 DIAGNOSIS — Z79899 Other long term (current) drug therapy: Secondary | ICD-10-CM | POA: Insufficient documentation

## 2021-07-10 DIAGNOSIS — Z91040 Latex allergy status: Secondary | ICD-10-CM | POA: Insufficient documentation

## 2021-07-10 DIAGNOSIS — M25512 Pain in left shoulder: Secondary | ICD-10-CM | POA: Insufficient documentation

## 2021-07-10 DIAGNOSIS — Z20822 Contact with and (suspected) exposure to covid-19: Secondary | ICD-10-CM | POA: Diagnosis not present

## 2021-07-10 DIAGNOSIS — S79912A Unspecified injury of left hip, initial encounter: Secondary | ICD-10-CM | POA: Diagnosis present

## 2021-07-10 DIAGNOSIS — W1839XA Other fall on same level, initial encounter: Secondary | ICD-10-CM | POA: Insufficient documentation

## 2021-07-10 DIAGNOSIS — S7002XA Contusion of left hip, initial encounter: Secondary | ICD-10-CM | POA: Insufficient documentation

## 2021-07-10 DIAGNOSIS — M79632 Pain in left forearm: Secondary | ICD-10-CM | POA: Insufficient documentation

## 2021-07-10 DIAGNOSIS — Z87891 Personal history of nicotine dependence: Secondary | ICD-10-CM | POA: Insufficient documentation

## 2021-07-10 DIAGNOSIS — Y9389 Activity, other specified: Secondary | ICD-10-CM | POA: Insufficient documentation

## 2021-07-10 DIAGNOSIS — Z7901 Long term (current) use of anticoagulants: Secondary | ICD-10-CM | POA: Insufficient documentation

## 2021-07-10 DIAGNOSIS — M79652 Pain in left thigh: Secondary | ICD-10-CM | POA: Insufficient documentation

## 2021-07-10 DIAGNOSIS — W19XXXA Unspecified fall, initial encounter: Secondary | ICD-10-CM

## 2021-07-10 DIAGNOSIS — W010XXA Fall on same level from slipping, tripping and stumbling without subsequent striking against object, initial encounter: Secondary | ICD-10-CM | POA: Diagnosis not present

## 2021-07-10 LAB — CBC WITH DIFFERENTIAL/PLATELET
Abs Immature Granulocytes: 0.03 10*3/uL (ref 0.00–0.07)
Basophils Absolute: 0 10*3/uL (ref 0.0–0.1)
Basophils Relative: 0 %
Eosinophils Absolute: 0.4 10*3/uL (ref 0.0–0.5)
Eosinophils Relative: 4 %
HCT: 38.3 % (ref 36.0–46.0)
Hemoglobin: 12.2 g/dL (ref 12.0–15.0)
Immature Granulocytes: 0 %
Lymphocytes Relative: 15 %
Lymphs Abs: 1.3 10*3/uL (ref 0.7–4.0)
MCH: 31.4 pg (ref 26.0–34.0)
MCHC: 31.9 g/dL (ref 30.0–36.0)
MCV: 98.5 fL (ref 80.0–100.0)
Monocytes Absolute: 0.5 10*3/uL (ref 0.1–1.0)
Monocytes Relative: 6 %
Neutro Abs: 6.9 10*3/uL (ref 1.7–7.7)
Neutrophils Relative %: 75 %
Platelets: 274 10*3/uL (ref 150–400)
RBC: 3.89 MIL/uL (ref 3.87–5.11)
RDW: 15.6 % — ABNORMAL HIGH (ref 11.5–15.5)
WBC: 9.2 10*3/uL (ref 4.0–10.5)
nRBC: 0 % (ref 0.0–0.2)

## 2021-07-10 LAB — URINALYSIS, COMPLETE (UACMP) WITH MICROSCOPIC
Bilirubin Urine: NEGATIVE
Glucose, UA: NEGATIVE mg/dL
Hgb urine dipstick: NEGATIVE
Ketones, ur: NEGATIVE mg/dL
Leukocytes,Ua: NEGATIVE
Nitrite: NEGATIVE
Protein, ur: NEGATIVE mg/dL
Specific Gravity, Urine: 1.025 (ref 1.005–1.030)
pH: 5.5 (ref 5.0–8.0)

## 2021-07-10 LAB — COMPREHENSIVE METABOLIC PANEL
ALT: 17 U/L (ref 0–44)
AST: 29 U/L (ref 15–41)
Albumin: 3.5 g/dL (ref 3.5–5.0)
Alkaline Phosphatase: 68 U/L (ref 38–126)
Anion gap: 7 (ref 5–15)
BUN: 8 mg/dL (ref 8–23)
CO2: 28 mmol/L (ref 22–32)
Calcium: 8.6 mg/dL — ABNORMAL LOW (ref 8.9–10.3)
Chloride: 104 mmol/L (ref 98–111)
Creatinine, Ser: 0.76 mg/dL (ref 0.44–1.00)
GFR, Estimated: >60 mL/min (ref >60)
Glucose, Bld: 113 mg/dL — ABNORMAL HIGH (ref 70–99)
Potassium: 4.1 mmol/L (ref 3.5–5.1)
Sodium: 139 mmol/L (ref 135–145)
Total Bilirubin: 0.9 mg/dL (ref 0.3–1.2)
Total Protein: 6.3 g/dL — ABNORMAL LOW (ref 6.5–8.1)

## 2021-07-10 LAB — TROPONIN I (HIGH SENSITIVITY): Troponin I (High Sensitivity): 5 ng/L (ref <18)

## 2021-07-10 LAB — RESP PANEL BY RT-PCR (FLU A&B, COVID) ARPGX2
Influenza A by PCR: NEGATIVE
Influenza B by PCR: NEGATIVE
SARS Coronavirus 2 by RT PCR: NEGATIVE

## 2021-07-10 MED ORDER — QUETIAPINE FUMARATE 25 MG PO TABS: 25.0000 mg | ORAL_TABLET | Freq: Every day | ORAL | Status: DC

## 2021-07-10 MED ORDER — ROPINIROLE HCL 0.25 MG PO TABS
0.2500 mg | ORAL_TABLET | Freq: Two times a day (BID) | ORAL | Status: DC
  Filled 2021-07-10: qty 1

## 2021-07-10 MED ORDER — LIFITEGRAST 5 % OP SOLN: 1.0000 [drp] | Freq: Two times a day (BID) | OPHTHALMIC | Status: DC

## 2021-07-10 MED ORDER — APIXABAN 5 MG PO TABS: 5.0000 mg | ORAL_TABLET | Freq: Two times a day (BID) | ORAL | Status: DC

## 2021-07-10 MED ORDER — LEVOTHYROXINE SODIUM 50 MCG PO TABS: 100.0000 ug | ORAL_TABLET | Freq: Every day | ORAL | Status: DC

## 2021-07-10 MED ORDER — PANTOPRAZOLE SODIUM 40 MG PO TBEC: 40.0000 mg | DELAYED_RELEASE_TABLET | Freq: Every day | ORAL | Status: DC

## 2021-07-10 MED ORDER — LORATADINE 10 MG PO TABS: 10.0000 mg | ORAL_TABLET | Freq: Every day | ORAL | Status: DC

## 2021-07-10 MED ORDER — OXYCODONE HCL 5 MG PO TABS
5.0000 mg | ORAL_TABLET | Freq: Once | ORAL | Status: AC
Start: 2021-07-10 — End: 2021-07-10
  Administered 2021-07-10: 5 mg via ORAL
  Filled 2021-07-10: qty 1

## 2021-07-10 MED ORDER — ASPIRIN 81 MG PO TBEC: 81.0000 mg | DELAYED_RELEASE_TABLET | Freq: Every day | ORAL | Status: DC

## 2021-07-10 MED ORDER — VENLAFAXINE HCL ER 150 MG PO CP24
150.0000 mg | ORAL_CAPSULE | Freq: Every day | ORAL | Status: DC
  Filled 2021-07-10: qty 1

## 2021-07-10 MED ORDER — ATORVASTATIN CALCIUM 20 MG PO TABS
40.0000 mg | ORAL_TABLET | ORAL | Status: DC
  Administered 2021-07-10: 40 mg via ORAL
  Filled 2021-07-10: qty 2

## 2021-07-10 MED ORDER — ACETAMINOPHEN 325 MG PO TABS: 650.0000 mg | ORAL_TABLET | Freq: Four times a day (QID) | ORAL | Status: DC | PRN

## 2021-07-10 MED ORDER — METFORMIN HCL 500 MG PO TABS
500.0000 mg | ORAL_TABLET | Freq: Two times a day (BID) | ORAL | Status: DC
  Administered 2021-07-10: 500 mg via ORAL
  Filled 2021-07-10: qty 1

## 2021-07-10 MED ORDER — METHOTREXATE 2.5 MG PO TABS: 17.5000 mg | ORAL_TABLET | ORAL | Status: DC

## 2021-07-10 MED ORDER — ACETAMINOPHEN 500 MG PO TABS
1000.0000 mg | ORAL_TABLET | Freq: Once | ORAL | Status: AC
  Administered 2021-07-10: 1000 mg via ORAL
  Filled 2021-07-10: qty 2

## 2021-07-10 MED ORDER — TAMSULOSIN HCL 0.4 MG PO CAPS
0.4000 mg | ORAL_CAPSULE | Freq: Every day | ORAL | Status: DC
  Administered 2021-07-10: 0.4 mg via ORAL
  Filled 2021-07-10: qty 1

## 2021-07-10 MED ORDER — METFORMIN HCL 500 MG PO TABS: 500.0000 mg | ORAL_TABLET | Freq: Two times a day (BID) | ORAL | Status: DC

## 2021-07-10 MED ORDER — LUBIPROSTONE 24 MCG PO CAPS
24.0000 ug | ORAL_CAPSULE | Freq: Two times a day (BID) | ORAL | Status: DC
  Filled 2021-07-10 (×2): qty 1

## 2021-07-10 MED ORDER — HYDROXYCHLOROQUINE SULFATE 200 MG PO TABS
400.0000 mg | ORAL_TABLET | Freq: Every day | ORAL | Status: DC
  Administered 2021-07-10: 400 mg via ORAL
  Filled 2021-07-10: qty 2

## 2021-07-10 MED ORDER — CLONAZEPAM 0.5 MG PO TABS: 1.0000 mg | ORAL_TABLET | Freq: Every day | ORAL | Status: DC

## 2021-07-10 MED ORDER — GABAPENTIN 300 MG PO CAPS
600.0000 mg | ORAL_CAPSULE | Freq: Two times a day (BID) | ORAL | Status: DC
  Administered 2021-07-10: 600 mg via ORAL
  Filled 2021-07-10: qty 2

## 2021-07-10 MED ORDER — TRAZODONE HCL 50 MG PO TABS: 25.0000 mg | ORAL_TABLET | Freq: Every evening | ORAL | Status: DC | PRN

## 2021-07-10 NOTE — ED Triage Notes (Signed)
Pt brought in by [facility] from home, pt fell last night going to bathroom. States felt dizzy and fell. Was seen here this am for the same, but here for persistent left thigh pain. Pt did have imaging of thigh done which was negative. Pt now having left arm pain, states from shoulder to hand. Pain worse on movement on palpation. Pt has had a stroke and is unable to move left arm.

## 2021-07-10 NOTE — ED Notes (Signed)
Pin pad in room not working. Patient verbalized understanding. No questions for RN.

## 2021-07-10 NOTE — ED Notes (Signed)
Port x-ray performed 

## 2021-07-10 NOTE — ED Notes (Signed)
PT at bedside. Patient given TV remote.

## 2021-07-10 NOTE — ED Notes (Signed)
Patient reports that Molly Maduro is not her husband but her ex boyfriend. He was full time caretaker and has decided to leave patient; therefore, why she is here needing placement. At this time she would no longer like information to be given to [facility].

## 2021-07-10 NOTE — ED Notes (Signed)
Patient repositioned in bed  at this time. No signs of distress. No needs verbalized by patient at this time.

## 2021-07-10 NOTE — TOC Initial Note (Addendum)
Transition of Care [facility]) - Progression Note    Patient Details  Name: Ariel Alvarez MRN: [id number] Date of Birth: 1945-12-14  Transition of Care [facility]) CM/SW Contact  Marina Goodell Phone Number: [phone] 07/10/2021, 3:23 PM  Clinical Narrative:     CSW and patient discussed the difference between SNF and Home Health.  Patient prefers home health.  Patient states she is active with home health but cannot remember which agency. CSW reached out the different home health agencies, to find out who she is active with. Patient stated her fiance Lennice Sites [phone] will provide transportation home.       Expected Discharge Plan and Services                                                 Social Determinants of Health (SDOH) Interventions    Readmission Risk Interventions No flowsheet data found.

## 2021-07-10 NOTE — ED Notes (Signed)
Patient assisted to bathroom by RN and Joyice Faster NT. Patient had small BM. Patient cleaned, bed liens changed, and new purewick applied.

## 2021-07-10 NOTE — ED Triage Notes (Signed)
Pt with multiple falls tonight. Pt lives alone. Pt with left hip pain post fall. Pt with previous cva with left arm and leg affected. Pt uses a walker. Pt denies hitting head when she fell.

## 2021-07-10 NOTE — ED Notes (Signed)
Patient desat to 85-86% on room air. O2/2L via nasal cannula applied. Sats maintained at 96%

## 2021-07-10 NOTE — ED Notes (Signed)
Patient given lunch tray at this time.

## 2021-07-10 NOTE — ED Notes (Signed)
Pharmacy tech at bedside 

## 2021-07-10 NOTE — ED Notes (Signed)
Patient repositioned in bed at this time.

## 2021-07-10 NOTE — ED Notes (Signed)
MD at the bedside  

## 2021-07-10 NOTE — ED Notes (Signed)
Update given to patient's husband, Molly Maduro at this time. Husband's phone number is [phone].

## 2021-07-10 NOTE — ED Triage Notes (Signed)
EMS brings pt in from home for c/o left leg pain; seen here this morning for same and was d/c

## 2021-07-10 NOTE — ED Provider Notes (Addendum)
[facility] Emergency Department Provider Note  ____________________________________________  Time seen: Approximately 5:15 AM  I have reviewed the triage vital signs and the nursing notes.   HISTORY  Chief Complaint Fall   HPI Ariel Alvarez is a 75 y.o. female 75 y.o. female with a history of CVA with left hemiparesis, difficulty ambulation requiring assistance and walker, collagen vascular disease, hypothyroidism who presents for evaluation of a fall.  Patient reports that she lives with her fianc.  Today he broke up with her and left with her car.  Patient reports that she was really upset as she is dependent on him for ambulation, meals, and assistance with dressing.  She was laying on the couch and try to ambulate to her commode when she lost her balance and fell.  She reports that she fell onto her left side.  She is complaining of left thigh/hip pain.  She denies head trauma or LOC.  No neck pain or back pain, no headache, no chest pain.  Patient does seem slightly short of breath which she tells me is chronic for her.  She denies generalized weakness, changes in her chronic left-sided weakness, fever or chills, vomiting or diarrhea, dysuria or hematuria.  Patient feels that otherwise she is at baseline.    Past Medical History:  Diagnosis Date   Anxiety    Arthritis    RIGHT HAND   Collagen vascular disease (HCC)    Complication of anesthesia    HARD TO WAKE UP AFTER  ONE SURGERY-PT STAETS SHE WAS GIVEN TOO MUCH ANESTHESIA   Depression    Dyspnea    VERY RARE WITH EXERTION   GERD (gastroesophageal reflux disease)    History of kidney stones    H/O   Hypothyroidism    Stroke (HCC) 1610,9604   X2-LEFT HAD PARALYZED     Patient Active Problem List   Diagnosis Date Noted   Hyperlipidemia 12/08/2019   Stroke (HCC) 12/08/2019   Ulcerated, foot, unspecified laterality, limited to breakdown of skin (HCC) 12/08/2019   Collagen vascular disease  (HCC) 05/21/2017   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 Authorizing Provider  acetaminophen (TYLENOL) 325 MG tablet Take 325 mg by mouth every 6 (six) hours as needed.  12/22/08   [provider]  albuterol (VENTOLIN HFA) 108 (90 Base) MCG/ACT inhaler Inhale 1-2 puffs into the lungs every 6 (six) hours as needed.  09/30/18   [provider]  apixaban (ELIQUIS) 5 MG TABS tablet Take 5 mg by mouth 2 (two) times daily.  11/26/19   [provider]  aspirin 81 MG EC tablet Take 81 mg by mouth daily.     [provider]  atorvastatin (LIPITOR) 40 MG tablet Take 40 mg by mouth every morning.     [provider]  cetirizine (ZYRTEC) 10 MG tablet Take 10 mg by mouth daily after lunch.     [provider]  clonazePAM (KLONOPIN) 1 MG tablet Take 2 mg by mouth at bedtime.     [provider]  clotrimazole (LOTRIMIN) 1 %  cream Apply to affected area 2 times daily 05/16/21   Eusebio Friendly B, PA-C  diphenhydramine-acetaminophen (TYLENOL PM) 25-500 MG TABS tablet Take 1 tablet by mouth at bedtime.     [provider]  fluconazole (DIFLUCAN) 150 MG tablet Take 1 tab PO q72h PRN yeast infection 05/16/21   Eusebio Friendly B, PA-C  folic acid (FOLVITE) 1 MG tablet Take by mouth. 08/23/20   [provider]  gabapentin (NEURONTIN) 300 MG capsule Take 300 mg by mouth 2 (two) times daily. 2 TABS IN AM AND 2 TABS IN PM 03/12/17   [provider]  hydroxychloroquine (PLAQUENIL) 200 MG tablet Take 400 mg by mouth daily.     [provider]   levothyroxine (SYNTHROID, LEVOTHROID) 100 MCG tablet Take by mouth. 04/19/17 05/16/21  [provider]  Lifitegrast 5 % SOLN Xiidra 5 % eye drops in a dropperette  PLACE 1 DROP IN OU BID    [provider]  lubiprostone (AMITIZA) 24 MCG capsule Take 24 mcg by mouth 2 (two) times daily with a meal.     [provider]  metFORMIN (GLUCOPHAGE) 500 MG tablet Take 1 tablet by mouth 2 (two) times daily. 02/13/21   [provider]  methotrexate (RHEUMATREX) 2.5 MG tablet Take by mouth. 03/13/21   [provider]  nystatin (MYCOSTATIN/NYSTOP) 100000 UNIT/GM POWD Apply to the affected areas 2 to 3 times daily until healing is complete 08/02/15   Renford Dills, NP  nystatin (MYCOSTATIN/NYSTOP) powder Nystop 100,000 unit/gram topical powder  APPLY TO AFFECTED AREA 3 TIMES DAILY    [provider]  omeprazole (PRILOSEC) 20 MG capsule Take 20 mg by mouth every morning.     [provider]  pantoprazole (PROTONIX) 40 MG tablet Take 1 tablet by mouth daily. 12/26/20   [provider]  pravastatin (PRAVACHOL) 20 MG tablet Take 20 mg by mouth daily.    [provider]  PRAVASTATIN SODIUM PO Take by mouth.     [provider]  tiZANidine (ZANAFLEX) 4 MG capsule Take 4 mg by mouth 3 (three) times daily.     [provider]  traZODone (DESYREL) 50 MG tablet Take 25 mg by mouth at bedtime as needed. 03/23/21   [provider]  venlafaxine XR (EFFEXOR-XR) 150 MG 24 hr capsule venlafaxine hcl er 150 mg cp24    [provider]  eszopiclone (LUNESTA) 2 MG TABS tablet Take 2 mg by mouth at bedtime as needed for sleep. Take immediately before bedtime Patient not taking: Reported on 04/14/2020  09/08/20  [provider]  phentermine 30 MG capsule Take 30 mg by mouth every morning.  09/08/20  [provider]  warfarin (COUMADIN) 2.5 MG tablet Take 2.5 mg by mouth. Patient not taking: Reported on  04/14/2020  09/08/20  [provider]    Allergies Latex and Prednisone  Family History  Problem Relation Age of Onset   Diabetes Son    Cancer Paternal Aunt    Stroke Paternal Grandmother     Social History Social History   Tobacco Use   Smoking status: Former    Packs/day: 1.00    Years: 20.00    Pack years: 20.00    Types: Cigarettes    Quit date: 10/29/1974    Years since quitting: 46.7   Smokeless tobacco: Never  Vaping Use   Vaping Use: Never used  Substance Use Topics   Alcohol use: Yes    Comment: WINE OCC  Drug use: No    Review of Systems  Constitutional: Negative for fever. Eyes: Negative for visual changes. ENT: Negative for facial injury or neck injury Cardiovascular: Negative for chest injury. Respiratory: Negative for shortness of breath. Negative for chest wall injury. Gastrointestinal: Negative for abdominal pain or injury. Genitourinary: Negative for dysuria. Musculoskeletal: Negative for back injury, + L hip and thigh pain Skin: Negative for laceration/abrasions. Neurological: Negative for head injury.   ____________________________________________   PHYSICAL EXAM:  VITAL SIGNS: ED Triage Vitals  Enc Vitals Group     BP 07/10/21 0328 130/73     Pulse Rate 07/10/21 0328 89     Resp 07/10/21 0328 16     Temp 07/10/21 0328 97.7 F (36.5 C)     Temp Source 07/10/21 0328 Oral     SpO2 07/10/21 0328 93 %     Weight 07/10/21 0329 267 lb (121.1 kg)     Height 07/10/21 0329 5\' 3"  (1.6 m)     Head Circumference --      Peak Flow --      Pain Score 07/10/21 0328 6     Pain Loc --      Pain Edu? --      Excl. in GC? --     Full spinal precautions maintained throughout the trauma exam. Constitutional: Alert and oriented. No acute distress. Does not appear intoxicated. HEENT Head: Normocephalic and atraumatic. Face: No facial bony tenderness. Stable midface Ears: No hemotympanum bilaterally. No Battle sign Eyes: No eye injury.  PERRL. No raccoon eyes Nose: Nontender. No epistaxis. No rhinorrhea Mouth/Throat: Mucous membranes are moist. No oropharyngeal blood. No dental injury. Airway patent without stridor. Normal voice. Neck: no C-collar. No midline c-spine tenderness.  Cardiovascular: Normal rate, regular rhythm. Normal and symmetric distal pulses are present in all extremities. Pulmonary/Chest: Chest wall is stable and nontender to palpation/compression. Normal respiratory effort. Breath sounds are normal. No crepitus.  Abdominal: Soft, nontender, non distended. Musculoskeletal: Nontender with normal full range of motion in all extremities. No deformities. No thoracic or lumbar midline spinal tenderness. Pelvis is stable. Skin: Skin is warm, dry and intact. No abrasions or contutions. Psychiatric: Speech and behavior are appropriate. Neurological: Normal speech and language. L hemiparesis with muscular contractures.  Glascow Coma Score: 4 - Opens eyes on own 6 - Follows simple motor commands 5 - Alert and oriented GCS: 15   ____________________________________________   LABS (all labs ordered are listed, but only abnormal results are displayed)  Labs Reviewed  CBC WITH DIFFERENTIAL/PLATELET - Abnormal; Notable for the following components:      Result Value   RDW 15.6 (*)    All other components within normal limits  COMPREHENSIVE METABOLIC PANEL - Abnormal; Notable for the following components:   Glucose, Bld 113 (*)    Calcium 8.6 (*)    Total Protein 6.3 (*)    All other components within normal limits  URINALYSIS, COMPLETE (UACMP) WITH MICROSCOPIC - Abnormal; Notable for the following components:   APPearance CLEAR (*)    Bacteria, UA RARE (*)    All other components within normal limits  RESP PANEL BY RT-PCR (FLU A&B, COVID) [id number]  TROPONIN I (HIGH SENSITIVITY)  TROPONIN I (HIGH SENSITIVITY)   ____________________________________________  EKG  ED ECG REPORT I, 09/09/21, the  attending physician, personally viewed and interpreted this ECG.  Sinus rhythm with rate of 94, normal intervals, normal axis, no ST elevations or depressions, low voltage QRS.  No prior for comparison  ____________________________________________  RADIOLOGY  I have personally reviewed the images performed during this visit and I agree with the Radiologist's read.   Interpretation by Radiologist:  DG Chest Portable 1 View  Result Date: 07/10/2021 CLINICAL DATA:  Shortness of breath for 2 days. EXAM: PORTABLE CHEST 1 VIEW COMPARISON:  None. FINDINGS: 0434 hours. Low volume film. Cardiopericardial silhouette is at upper limits of normal for size. There is pulmonary vascular congestion without overt pulmonary edema. Degenerative changes, potentially posttraumatic noted in the left shoulder. Telemetry leads overlie the chest. IMPRESSION: Low volume film with pulmonary vascular congestion. Electronically Signed   By: Kennith Center M.D.   On: 07/10/2021 05:01   DG Hip Unilat With Pelvis 2-3 Views Left  Result Date: 07/10/2021 CLINICAL DATA:  75 year old female status post fall. EXAM: DG HIP (WITH OR WITHOUT PELVIS) 2-3V LEFT COMPARISON:  CT Abdomen and Pelvis 02/16/2017. FINDINGS: Femoral heads are normally located. Pelvis appears stable and intact. SI joints and pubic symphysis remain within normal limits. AP and frogleg lateral views of the left hip. The proximal left femur appears intact. Grossly intact proximal right femur. Negative visible bowel gas pattern. IMPRESSION: No acute fracture or dislocation identified about the left hip or pelvis. Electronically Signed   By: Odessa Fleming M.D.   On: 07/10/2021 04:13     ____________________________________________   PROCEDURES  Procedure(s) performed: yes .1-3 Lead EKG Interpretation Performed by: Nita Sickle, MD Authorized by: Nita Sickle, MD     Interpretation: non-specific     ECG rate assessment: normal     Rhythm: sinus rhythm      Ectopy: none     Conduction: normal    Critical Care performed:  None ____________________________________________   INITIAL IMPRESSION / ASSESSMENT AND PLAN / ED COURSE  75 y.o. female 75 y.o. female with a history of CVA with left hemiparesis, difficulty ambulation requiring assistance and walker, collagen vascular disease, hypothyroidism who presents for evaluation of a fall.  Patient requires assistance at home for ambulation, to dress herself, and for meals since she has left-sided hemiparesis.  She usually relies on her fianc who broke up with her today and left patient.  Patient does not have anyone else to rely on.  She feels very uncomfortable going home since she is not able to take care of herself.  She reports that her sister was trying to get her place in a nursing home but has not been successful.  She had a mechanical fall this evening and is complaining of left thigh and hip pain.  On exam there is no bruising, she has full range of motion with no significant tenderness.  X-ray visualized by me with no fracture or dislocation.  We will give 1000 g of Tylenol and 5 mg of oxycodone for pain.  EKG with no signs of dysrhythmias.  Patient placed on telemetry for close monitoring with no signs of dysrhythmias as well.  UA with no signs of UTI.  Blood work with no signs of sepsis, dehydration, anemia, significant electrolyte derangements, AKI, cardiac ischemia.  Chest x-ray with no signs of pneumonia.  Patient with normal sats and clear lungs on auscultation.  Vitals are within normal limits otherwise.  We will consult social work to help with rehab placement since patient is unsafe to be discharged home without help.  Old medical records reviewed.    _________________________ 6:19 AM on 07/10/2021 ----------------------------------------- Called by RN as patient destaed to 85%. Walked in the room and patient sleeping, very groggy  from oxycodone. Normal WOB. Plan to re-eval once patient  is more awake and no longer under influence of narcotics. Patient denies feeling SOB,  ____________________________________________  Please note:  Patient was evaluated in Emergency Department today for the symptoms described in the history of present illness. Patient was evaluated in the context of the global COVID-19 pandemic, which necessitated consideration that the patient might be at risk for infection with the SARS-CoV-2 virus that causes COVID-19. Institutional protocols and algorithms that pertain to the evaluation of patients at risk for COVID-19 are in a state of rapid change based on information released by regulatory bodies including the CDC and federal and state organizations. These policies and algorithms were followed during the patient's care in the ED.  Some ED evaluations and interventions may be delayed as a result of limited staffing during the pandemic.   ____________________________________________   FINAL CLINICAL IMPRESSION(S) / ED DIAGNOSES   Final diagnoses:  Fall, initial encounter  Contusion of left hip, initial encounter      NEW MEDICATIONS STARTED DURING THIS VISIT:  ED Discharge Orders     None        Note:  This document was prepared using Dragon voice recognition software and may include unintentional dictation errors.    Don Perking, [place], MD 07/10/21 2080    Nita Sickle, MD 07/10/21 [phone]

## 2021-07-10 NOTE — ED Provider Notes (Signed)
Personally saw and evaluated the patient.  Discussed with our social work team and also with the patient.  Patient is amenable with the plan to return home, I have ordered social work home health physical therapy for her.  Her fianc is picking her up she does have a wheelchair Zenaida Niece and also uses a Engineer, maintenance (IT) at home and the patient is comfortable with the plan.  She is resting comfortably at this time awake alert oriented no distress.   Sharyn Creamer, MD 07/10/21 1622

## 2021-07-10 NOTE — Progress Notes (Signed)
Physical Therapy Evaluation Patient Details Name: Ariel Alvarez MRN: [id number] DOB: Nov 15, 1945 Today's Date: 07/10/2021  History of Present Illness  Ariel Alvarez is a 75 y.o. female 75 y.o. female with a history of CVA with left hemiparesis, difficulty ambulation requiring assistance and walker, collagen vascular disease, hypothyroidism who presents for evaluation of a fall.  Patient reports that she lives with her fianc.  Today he broke up with her and left with her car.  Patient reports that she was really upset as she is dependent on him for ambulation, meals, and assistance with dressing.  She was laying on the couch and try to ambulate to her commode when she lost her balance and fell.  She reports that she fell onto her left side.  She is complaining of left thigh/hip pain. Follow up imaging reports no fracture   Clinical Impression  Pt is a pleasant 75 year old female who presents to ED s/p fall in home. Pt required maxA with attempting to sit EOB due to previous R CVA with resultant L hemiparesis. Pt reporting that her caregiver no longer will be able to assist her with ADLs and IADLs. Pt demonstrated impaired bed mobility, decreased strength, impaired balance, and decreased activity tolerance. Pt required 1L of oxygen during bed mobility due to SpO2 dropping to 80% on room air. With oxygen, SpO2 improved to >93%. PT recommending SNF at discharge to improve overall functional mobility and independence with ADLs. Pt inquiring regarding other long term solutions for her care following SNF placement.     Recommendations for follow up therapy are one component of a multi-disciplinary discharge planning process, led by the attending physician.  Recommendations may be updated based on patient status, additional functional criteria and insurance authorization.  Follow Up Recommendations SNF;Supervision/Assistance - 24 hour    Equipment Recommendations  None recommended by PT     Recommendations for Other Services       Precautions / Restrictions Precautions Precautions: Fall Restrictions Weight Bearing Restrictions: No      Mobility  Bed Mobility Overal bed mobility: Needs Assistance Bed Mobility: Supine to Sit           General bed mobility comments: Unable to achieve full sitting at EOB with maxA for management of B LEs and trunk. Pt also reporting fatigue    Transfers Overall transfer level: Needs assistance          General transfer comment: Unable to attempt transfer due to patient fatigue  Ambulation/Gait        General Gait Details: Deferred due to patient fatigue and assistance required with bed mobility  Stairs            Wheelchair Mobility    Modified Rankin (Stroke Patients Only)       Balance Overall balance assessment: Needs assistance;History of Falls Sitting-balance support: Feet unsupported Sitting balance-Leahy Scale: Poor Sitting balance - Comments: Poor trunk control with attempting to sit EOB             Pertinent Vitals/Pain Pain Assessment: Faces Faces Pain Scale: Hurts little more Pain Location: L hip Pain Descriptors / Indicators: Dull;Aching    Home Living Family/patient expects to be discharged to:: Private residence Living Arrangements: Alone Available Help at Discharge: Other (Comment) (None) Type of Home: House Home Access: Ramped entrance     Home Layout: One level Home Equipment: Walker - 2 wheels;Shower seat;Other (comment) (Standup walker, Power WC pending) Additional Comments: Pt reports she was living with her boyfriend  however, he broke up with her today so she will not have any additional help. No family near by willing to assist    Prior Function Level of Independence: Needs assistance   Gait / Transfers Assistance Needed: Assistance with ambulation and transfers  ADL's / Homemaking Assistance Needed: Dependent        Hand Dominance   Dominant Hand: Right     Extremity/Trunk Assessment   Upper Extremity Assessment Upper Extremity Assessment: LUE deficits/detail LUE Deficits / Details: Pt with L hemiparetic arm positioned with washcloth in L hand    Lower Extremity Assessment Lower Extremity Assessment: RLE deficits/detail;LLE deficits/detail RLE Deficits / Details: Grossly 5/5 MMT in supine LLE Deficits / Details: Grossly 3/5 MMT in supine       Communication   Communication: No difficulties  Cognition Arousal/Alertness: Awake/alert Behavior During Therapy: WFL for tasks assessed/performed Overall Cognitive Status: Within Functional Limits for tasks assessed                                        General Comments      Exercises     Assessment/Plan    PT Assessment Patient needs continued PT services  PT Problem List Decreased strength;Decreased range of motion;Decreased activity tolerance;Decreased balance;Decreased mobility;Decreased safety awareness;Impaired tone       PT Treatment Interventions Gait training;DME instruction;Therapeutic activities;Therapeutic exercise;Functional mobility training;Balance training;Neuromuscular re-education;Wheelchair mobility training    PT Goals (Current goals can be found in the Care Plan section)  Acute Rehab PT Goals Patient Stated Goal: to be able to return home and have less falls PT Goal Formulation: With patient Time For Goal Achievement: 07/24/21 Potential to Achieve Goals: Fair    Frequency Min 2X/week   Barriers to discharge Decreased caregiver support Pt requiring 24/7 assistance/supervision for safety    Co-evaluation               AM-PAC PT "6 Clicks" Mobility  Outcome Measure Help needed turning from your back to your side while in a flat bed without using bedrails?: Total Help needed moving from lying on your back to sitting on the side of a flat bed without using bedrails?: Total Help needed moving to and from a bed to a chair (including a  wheelchair)?: Total Help needed standing up from a chair using your arms (e.g., wheelchair or bedside chair)?: Total Help needed to walk in hospital room?: Total Help needed climbing 3-5 steps with a railing? : Total 6 Click Score: 6    End of Session Equipment Utilized During Treatment: Oxygen Activity Tolerance: Patient limited by fatigue;Patient limited by pain Patient left: in bed;with call bell/phone within reach Nurse Communication: Mobility status PT Visit Diagnosis: Unsteadiness on feet (R26.81);Repeated falls (R29.6);Muscle weakness (generalized) (M62.81)    Time: 2683-4196 PT Time Calculation (min) (ACUTE ONLY): 36 min   Charges:   PT Evaluation $PT Eval Low Complexity: 1 Low PT Treatments $Therapeutic Activity: 8-22 mins        Verl Blalock, SPT   Verl Blalock 07/10/2021, 4:37 PM

## 2021-07-10 NOTE — ED Notes (Signed)
Patient given breakfast tray and ginger ale. Patient on phone at this time. Call light within reach.

## 2021-07-11 ENCOUNTER — Emergency Department: Payer: Medicare PPO

## 2021-07-11 ENCOUNTER — Emergency Department
Admission: EM | Admit: 2021-07-11 | Discharge: 2021-07-11 | Disposition: A | Discharge status: Home / Self Care | Payer: Medicare PPO | Source: Home / Self Care | Attending: Emergency Medicine | Admitting: Emergency Medicine

## 2021-07-11 DIAGNOSIS — W19XXXD Unspecified fall, subsequent encounter: Secondary | ICD-10-CM

## 2021-07-11 DIAGNOSIS — S7002XA Contusion of left hip, initial encounter: Secondary | ICD-10-CM

## 2021-07-11 MED ORDER — ACETAMINOPHEN 325 MG PO TABS
650.0000 mg | ORAL_TABLET | Freq: Once | ORAL | Status: AC | PRN
  Administered 2021-07-11: 650 mg via ORAL
  Filled 2021-07-11: qty 2

## 2021-07-11 MED ORDER — OXYCODONE HCL 5 MG PO TABS
5.0000 mg | ORAL_TABLET | Freq: Once | ORAL | Status: AC
Start: 2021-07-11 — End: 2021-07-11
  Administered 2021-07-11: 5 mg via ORAL
  Filled 2021-07-11: qty 1

## 2021-07-11 MED ORDER — FENTANYL CITRATE PF 50 MCG/ML IJ SOSY
25.0000 ug | PREFILLED_SYRINGE | Freq: Once | INTRAMUSCULAR | Status: AC
Start: 2021-07-11 — End: 2021-07-11
  Administered 2021-07-11: 25 ug via INTRAVENOUS
  Filled 2021-07-11: qty 1

## 2021-07-11 MED ORDER — ACETAMINOPHEN 500 MG PO TABS: 1000.0000 mg | ORAL_TABLET | Freq: Once | ORAL | Status: DC

## 2021-07-11 MED ORDER — OXYCODONE HCL 5 MG PO TABS: 5.0000 mg | ORAL_TABLET | Freq: Three times a day (TID) | ORAL | 0 refills | Status: DC | PRN

## 2021-07-11 MED ORDER — IBUPROFEN 400 MG PO TABS
400.0000 mg | ORAL_TABLET | Freq: Once | ORAL | Status: DC | PRN
  Filled 2021-07-11: qty 1

## 2021-07-11 NOTE — ED Notes (Signed)
Pt here with c/o fall, c/o L hip pain s/p fall. Pain tender to palpation. Pt seen yesterday for the same pain.  Pt states she is taking a muscle relaxer.

## 2021-07-11 NOTE — ED Notes (Signed)
Repositioned pt in recliner, purewick adjusted. Lights dimmed.

## 2021-07-11 NOTE — ED Provider Notes (Signed)
[facility] Emergency Department Provider Note  ____________________________________________  Time seen: Approximately 2:46 AM  I have reviewed the triage vital signs and the nursing notes.   HISTORY  Chief Complaint Fall   HPI Ariel Alvarez is a 75 y.o. female with a history of stroke and left-sided hemiparesis who was seen here yesterday by me after mechanical fall who presents complaining of worsening left forearm, left shoulder, and left hip pain after sustaining a mechanical fall yesterday.  Patient reports that she has been taking gabapentin and Tylenol at home without significant relief.  She is complaining of severe left-sided hip pain that is worse with any movement of the left leg.  She is also complaining of pain in her left forearm and left shoulder which has been present since yesterday.  Her pain is worse in the hip.  No fever or chills.  No other falls.   Past Medical History:  Diagnosis Date   Anxiety    Arthritis    RIGHT HAND   Collagen vascular disease (HCC)    Complication of anesthesia    HARD TO WAKE UP AFTER  ONE SURGERY-PT STAETS SHE WAS GIVEN TOO MUCH ANESTHESIA   Depression    Dyspnea    VERY RARE WITH EXERTION   GERD (gastroesophageal reflux disease)    History of kidney stones    H/O   Hypothyroidism    Stroke (HCC) 4010,2725   X2-LEFT HAD PARALYZED     Patient Active Problem List   Diagnosis Date Noted   Hyperlipidemia 12/08/2019   Stroke (HCC) 12/08/2019   Ulcerated, foot, unspecified laterality, limited to breakdown of skin (HCC) 12/08/2019   Collagen vascular disease (HCC) 05/21/2017   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 Authorizing Provider  oxyCODONE (ROXICODONE) 5 MG immediate release tablet Take 1 tablet (5 mg total) by mouth every 8 (eight) hours as needed. 07/11/21 07/11/22 Yes Mackie Goon, [place], [place]  acetaminophen (TYLENOL) 325 MG tablet Take 325 mg by mouth every 6 (six) hours as needed.  12/22/08   [provider]  albuterol (VENTOLIN HFA) 108 (90 Base) MCG/ACT inhaler Inhale 1-2 puffs into the lungs every 6 (six) hours as needed.  09/30/18   [provider]  apixaban (ELIQUIS) 5 MG TABS tablet Take 5 mg by mouth 2 (two) times daily.  11/26/19   [provider]  aspirin 81 MG EC tablet Take 81 mg by mouth daily.     [provider]  atorvastatin (LIPITOR) 40 MG tablet Take 40 mg by mouth every morning.     [provider]  cetirizine (ZYRTEC) 10 MG tablet Take 10 mg by mouth daily after lunch.     [provider]  Cholecalciferol 1.25 MG (50000 UT) capsule Take 50,000 Units by mouth daily.    [provider]  clonazePAM (KLONOPIN) 1 MG tablet Take 2 mg by mouth at bedtime.     [provider]  clotrimazole (LOTRIMIN) 1 % cream Apply to affected area 2 times daily 05/16/21   Eusebio Friendly B, PA-C  desvenlafaxine (PRISTIQ)  50 MG 24 hr tablet Take 50 mg by mouth daily. 06/15/21   [provider]  diphenhydramine-acetaminophen (TYLENOL PM) 25-500 MG TABS tablet Take 1 tablet by mouth at bedtime.     [provider]  fluconazole (DIFLUCAN) 150 MG tablet Take 1 tab PO q72h PRN yeast infection 05/16/21   Eusebio Friendly B, PA-C  fluticasone (FLONASE) 50 MCG/ACT nasal spray Place into the nose. 12/31/19   [provider]  folic acid (FOLVITE) 1 MG tablet Take by mouth. 08/23/20   [provider]  gabapentin (NEURONTIN) 300 MG capsule Take 300 mg by mouth 2 (two) times daily. 2 TABS IN AM AND 2 TABS IN PM 03/12/17    [provider]  hydroxychloroquine (PLAQUENIL) 200 MG tablet Take 400 mg by mouth daily.     [provider]  levothyroxine (SYNTHROID) 100 MCG tablet Take 100 mcg by mouth daily before breakfast. 08/05/20   [provider]  levothyroxine (SYNTHROID, LEVOTHROID) 100 MCG tablet Take by mouth. 04/19/17 05/16/21  [provider]  Lifitegrast 5 % SOLN Xiidra 5 % eye drops in a dropperette  PLACE 1 DROP IN OU BID    [provider]  lubiprostone (AMITIZA) 24 MCG capsule Take 24 mcg by mouth 2 (two) times daily with a meal.     [provider]  metFORMIN (GLUCOPHAGE) 500 MG tablet Take 1 tablet by mouth 2 (two) times daily. 02/13/21   [provider]  methocarbamol (ROBAXIN) 500 MG tablet Take 500 mg by mouth. 06/27/21   [provider]  methotrexate (RHEUMATREX) 2.5 MG tablet Take by mouth. 03/13/21   [provider]  nystatin (MYCOSTATIN/NYSTOP) 100000 UNIT/GM POWD Apply to the affected areas 2 to 3 times daily until healing is complete 08/02/15   Renford Dills, NP  nystatin (MYCOSTATIN/NYSTOP) powder Nystop 100,000 unit/gram topical powder  APPLY TO AFFECTED AREA 3 TIMES DAILY    [provider]  omeprazole (PRILOSEC) 20 MG capsule Take 20 mg by mouth every morning.     [provider]  pantoprazole (PROTONIX) 40 MG tablet Take 1 tablet by mouth daily. 12/26/20   [provider]  polyethylene glycol powder (GLYCOLAX/MIRALAX) 17 GM/SCOOP powder Take by mouth. 04/07/20   [provider]  pravastatin (PRAVACHOL) 20 MG tablet Take 20 mg by mouth daily.    [provider]  PRAVASTATIN SODIUM PO Take by mouth.     [provider]  pregabalin (LYRICA) 75 MG capsule Take by mouth. 07/06/20   [provider]  rOPINIRole (REQUIP) 0.25 MG tablet Take by mouth. 06/25/21   [provider]  sertraline (ZOLOFT) 50 MG tablet Take by mouth. 05/23/21   [provider]   tamsulosin (FLOMAX) 0.4 MG CAPS capsule Take 0.4 mg by mouth daily. 07/05/21   [provider]  tiZANidine (ZANAFLEX) 4 MG capsule Take 4 mg by mouth 3 (three) times daily.     [provider]  traZODone (DESYREL) 50 MG tablet Take 25 mg by mouth at bedtime as needed. 03/23/21   [provider]  venlafaxine XR (EFFEXOR-XR) 150 MG 24 hr capsule venlafaxine hcl er 150 mg cp24    [provider]  eszopiclone (LUNESTA) 2 MG TABS tablet Take 2 mg by mouth at bedtime as needed for sleep. Take immediately before bedtime Patient not taking: Reported on 04/14/2020  09/08/20  [provider]  phentermine 30 MG capsule Take 30 mg by mouth every morning.  09/08/20  [provider]  warfarin (COUMADIN) 2.5 MG tablet Take 2.5 mg by mouth. Patient not taking: Reported on 04/14/2020  09/08/20  [provider]    Allergies Latex and Prednisone  Family History  Problem Relation Age of Onset   Diabetes Son    Cancer Paternal Aunt    Stroke Paternal Grandmother     Social History Social History   Tobacco Use   Smoking status: Former    Packs/day: 1.00    Years: 20.00    Pack years: 20.00    Types: Cigarettes    Quit date: 10/29/1974    Years since quitting: 46.7   Smokeless tobacco: Never  Vaping Use   Vaping Use: Never used  Substance Use Topics   Alcohol use: Yes    Comment: WINE OCC   Drug use: No    Review of Systems  Constitutional: Negative for fever. Eyes: Negative for visual changes. ENT: Negative for facial injury or neck injury Cardiovascular: Negative for chest injury. Respiratory: Negative for shortness of breath. Negative for chest wall injury. Gastrointestinal: Negative for abdominal pain or injury. Genitourinary: Negative for dysuria. Musculoskeletal: Negative for back injury, + L hip, L forearm and L shoulder pain. Skin: Negative for laceration/abrasions. Neurological: Negative for head  injury.   ____________________________________________   PHYSICAL EXAM:  VITAL SIGNS: ED Triage Vitals  Enc Vitals Group     BP 07/10/21 2147 (!) 128/55     Pulse Rate 07/10/21 2147 92     Resp 07/10/21 2147 20     Temp 07/10/21 2147 98.3 F (36.8 C)     Temp Source 07/10/21 2147 Oral     SpO2 07/10/21 2147 99 %     Weight 07/10/21 2149 268 lb (121.6 kg)     Height 07/10/21 2149 5\' 3"  (1.6 m)     Head Circumference --      Peak Flow --      Pain Score 07/10/21 2149 7     Pain Loc --      Pain Edu? --      Excl. in GC? --     Full spinal precautions maintained throughout the trauma exam. Constitutional: Alert and oriented. No acute distress. Does not appear intoxicated. HEENT Head: Normocephalic and atraumatic. Face: No facial bony tenderness. Stable midface Ears: No hemotympanum bilaterally. No Battle sign Eyes: No eye injury. PERRL. No raccoon eyes Nose: Nontender. No epistaxis. No rhinorrhea Mouth/Throat: Mucous membranes are moist. No oropharyngeal blood. No dental injury. Airway patent without stridor. Normal voice. Neck: no C-collar. No midline c-spine tenderness.  Cardiovascular: Normal rate, regular rhythm. Normal and symmetric distal pulses are present in all extremities. Pulmonary/Chest: Chest wall is stable and nontender to palpation/compression. Normal respiratory effort. Breath sounds are normal. No crepitus.  Abdominal: Soft, nontender, non distended. Musculoskeletal: Left-sided contractures.  Patient is tender with rotation of the left hip, no significant tenderness or deformities of the left upper extremity.  Nontender with normal full range of motion in all other extremities. No deformities. No thoracic or lumbar midline spinal tenderness. Pelvis is stable. Skin: Skin is warm, dry and intact. No abrasions or contutions. Psychiatric: Speech and behavior are appropriate. Neurological: Normal speech and language. L hemiparesis  Glascow Coma Score: 4 - Opens  eyes on own 6 - Follows simple motor commands 5 - Alert and oriented GCS: 15   ____________________________________________   LABS (all labs ordered are listed, but only abnormal results are displayed)  Labs Reviewed - No data to display ____________________________________________  EKG  none  ____________________________________________  RADIOLOGY  I have personally reviewed the images performed during this visit and I agree with the Radiologist's read.   Interpretation by Radiologist:  DG Elbow Complete Left  Result Date: 07/10/2021 CLINICAL DATA:  Injury.  Fall EXAM: LEFT SHOULDER - 2+ VIEW; LEFT WRIST - COMPLETE 3+ VIEW; LEFT ELBOW - COMPLETE 3+ VIEW COMPARISON:  X-ray left humerus 03/16/2020 FINDINGS: Left shoulder: There is no evidence of acute displaced fracture or dislocation. Old healed proximal left humeral fracture noted. There is no evidence of arthropathy or other focal bone abnormality. Subcutaneus soft tissue edema. Left elbow: No evidence of fracture, dislocation. Limited evaluation for joint effusion due to nonstandard view on lateral. No evidence of severe arthropathy. No aggressive appearing focal bone abnormality. Soft tissues are unremarkable. Left wrist: No evidence of fracture, dislocation, or joint effusion. No evidence of severe arthropathy. No aggressive appearing focal bone abnormality. Soft tissues are unremarkable. IMPRESSION: No acute displaced fracture or dislocation of the bones of the left wrist, elbow, shoulder. Electronically Signed   By: Tish Frederickson M.D.   On: 07/10/2021 22:41   DG Wrist Complete Left  Result Date: 07/10/2021 CLINICAL DATA:  Injury.  Fall EXAM: LEFT SHOULDER - 2+ VIEW; LEFT WRIST - COMPLETE 3+ VIEW; LEFT ELBOW - COMPLETE 3+ VIEW COMPARISON:  X-ray left humerus 03/16/2020 FINDINGS: Left shoulder: There is no evidence of acute displaced fracture or dislocation. Old healed proximal left humeral fracture noted. There is no evidence  of arthropathy or other focal bone abnormality. Subcutaneus soft tissue edema. Left elbow: No evidence of fracture, dislocation. Limited evaluation for joint effusion due to nonstandard view on lateral. No evidence of severe arthropathy. No aggressive appearing focal bone abnormality. Soft tissues are unremarkable. Left wrist: No evidence of fracture, dislocation, or joint effusion. No evidence of severe arthropathy. No aggressive appearing focal bone abnormality. Soft tissues are unremarkable. IMPRESSION: No acute displaced fracture or dislocation of the bones of the left wrist, elbow, shoulder. Electronically Signed   By: Tish Frederickson M.D.   On: 07/10/2021 22:41   CT PELVIS WO CONTRAST  Result Date: 07/11/2021 CLINICAL DATA:  Left hip pain after fall EXAM: CT PELVIS WITHOUT CONTRAST TECHNIQUE: Multidetector CT imaging of the pelvis was performed following the standard protocol without intravenous contrast. COMPARISON:  None. FINDINGS: Urinary Tract:  Minimally distended bladder. Bowel:  Sigmoid diverticula Vascular/Lymphatic: Atheromatous plaque affecting the aorta and iliacs Reproductive:  Hysterectomy.  No adnexal mass Other:  No visible ascites or pneumoperitoneum Musculoskeletal: No fracture or hip dislocation. No evidence of musculotendinous disruption. Generalized osteopenia. IMPRESSION: Negative for fracture or malalignment. Electronically Signed   By: Tiburcio Pea M.D.   On: 07/11/2021 04:03   DG Shoulder Left  Result Date: 07/10/2021 CLINICAL DATA:  Injury.  Fall EXAM: LEFT SHOULDER - 2+ VIEW; LEFT WRIST - COMPLETE 3+ VIEW; LEFT ELBOW - COMPLETE 3+ VIEW COMPARISON:  X-ray left humerus 03/16/2020 FINDINGS: Left shoulder: There is no evidence of acute displaced fracture or dislocation. Old healed proximal left humeral fracture noted. There is no evidence of arthropathy or other focal bone abnormality. Subcutaneus soft tissue edema. Left elbow: No evidence of fracture, dislocation. Limited  evaluation for joint effusion due to nonstandard view on lateral. No evidence of severe arthropathy. No aggressive appearing focal bone abnormality. Soft tissues are unremarkable. Left wrist: No evidence of fracture, dislocation, or joint effusion. No evidence of severe arthropathy. No aggressive appearing focal bone abnormality. Soft tissues are unremarkable. IMPRESSION: No acute displaced fracture  or dislocation of the bones of the left wrist, elbow, shoulder. Electronically Signed   By: Tish Frederickson M.D.   On: 07/10/2021 22:41   DG Femur Min 2 Views Left  Result Date: 07/10/2021 CLINICAL DATA:  Fall with pain. EXAM: LEFT FEMUR 2 VIEWS COMPARISON:  None. FINDINGS: There is no evidence of fracture or other focal bone lesions. Soft tissues are unremarkable. IMPRESSION: Negative. Electronically Signed   By: Kennith Center M.D.   On: 07/10/2021 05:43     ____________________________________________   PROCEDURES  Procedure(s) performed: None Procedures Critical Care performed:  None ____________________________________________   INITIAL IMPRESSION / ASSESSMENT AND PLAN / ED COURSE  75 y.o. female with a history of stroke and left-sided hemiparesis who was seen here yesterday by me after mechanical fall who presents complaining of worsening left forearm, left shoulder, and left hip pain after sustaining a mechanical fall yesterday.  She was seen here yesterday and had XR to her hip which did not show any fracture.  Today had XRs of all the joints in the left upper extremity which were normal.  We will get a CT pelvis to rule out occult fracture.  Exam of the left upper extremity is unremarkable with no deformities or bruising.  Patient does have contractures from prior stroke.  We will give a dose of IV fentanyl and send patient for CT.  Chart from yesterday including all imaging were reviewed by me today.  _________________________ 4:43 AM on  07/11/2021 ----------------------------------------- CT of the pelvis with no signs of occult hip fracture.  Pain is well controlled with Tylenol and oxycodone.  We will provide a prescription for that.  Recommended follow-up with primary care doctor and discussed my standard return precautions.    ____________________________________________  Please note:  Patient was evaluated in Emergency Department today for the symptoms described in the history of present illness. Patient was evaluated in the context of the global COVID-19 pandemic, which necessitated consideration that the patient might be at risk for infection with the SARS-CoV-2 virus that causes COVID-19. Institutional protocols and algorithms that pertain to the evaluation of patients at risk for COVID-19 are in a state of rapid change based on information released by regulatory bodies including the CDC and federal and state organizations. These policies and algorithms were followed during the patient's care in the ED.  Some ED evaluations and interventions may be delayed as a result of limited staffing during the pandemic.   ____________________________________________   FINAL CLINICAL IMPRESSION(S) / ED DIAGNOSES   Final diagnoses:  Fall, subsequent encounter  Contusion of left hip, initial encounter      NEW MEDICATIONS STARTED DURING THIS VISIT:  ED Discharge Orders          Ordered    oxyCODONE (ROXICODONE) 5 MG immediate release tablet  Every 8 hours PRN        07/11/21 0445             Note:  This document was prepared using Dragon voice recognition software and may include unintentional dictation errors.    Don Perking, [place], MD 07/11/21 [phone]

## 2021-07-11 NOTE — ED Notes (Addendum)
E-signature pad unavailable - Pt & Spouse verbalized understanding of D/C information - no additional concerns at this time.  Pt escorted to her vehicle via wheelchair. Pt provided barrier cream and mepilex pad to her sacral region.

## 2021-07-11 NOTE — Discharge Instructions (Signed)
Pain control: Take tylenol 1000mg every 8 hours. Take 5mg of oxycodone every 6 hours for breakthrough pain. If you need the oxycodone make sure to take one senokot as well to prevent constipation.  Do not drink alcohol, drive or participate in any other potentially dangerous activities while taking this medication as it may make you sleepy. Do not take this medication with any other sedating medications, either prescription or over-the-counter.  

## 2021-07-11 NOTE — ED Notes (Signed)
Pt to CT

## 2021-07-11 NOTE — ED Notes (Signed)
Repositioned in recliner

## 2021-07-11 NOTE — ED Notes (Signed)
Pt requesting pain medication in triage, acute pain protocol initiated. Pt says she cannot take ibuprofen, it "does something to my blood". Tylenol ordered. Purewick adjusted.

## 2021-07-11 NOTE — ED Notes (Signed)
Pt back from CT

## 2021-07-11 NOTE — ED Notes (Signed)
Pts spouse conctacted about picking patient up for D/C.

## 2021-07-11 NOTE — ED Notes (Signed)
ED Provider at bedside. 

## 2021-07-13 ENCOUNTER — Other Ambulatory Visit: Payer: Self-pay

## 2021-07-13 ENCOUNTER — Ambulatory Visit
Admission: RE | Admit: 2021-07-13 | Discharge: 2021-07-13 | Disposition: A | Discharge status: Home / Self Care | Payer: Medicare PPO | Source: Ambulatory Visit | Attending: Internal Medicine | Admitting: Internal Medicine

## 2021-07-13 DIAGNOSIS — R404 Transient alteration of awareness: Secondary | ICD-10-CM | POA: Diagnosis present

## 2021-07-18 ENCOUNTER — Encounter: Payer: Self-pay | Admitting: Emergency Medicine

## 2021-07-18 ENCOUNTER — Ambulatory Visit
Admission: EM | Admit: 2021-07-18 | Discharge: 2021-07-18 | Disposition: A | Discharge status: Home / Self Care | Payer: Medicare PPO | Attending: Emergency Medicine | Admitting: Emergency Medicine

## 2021-07-18 ENCOUNTER — Other Ambulatory Visit: Payer: Self-pay

## 2021-07-18 DIAGNOSIS — N390 Urinary tract infection, site not specified: Secondary | ICD-10-CM | POA: Insufficient documentation

## 2021-07-18 LAB — URINALYSIS, COMPLETE (UACMP) WITH MICROSCOPIC
Bilirubin Urine: NEGATIVE
Glucose, UA: NEGATIVE mg/dL
Ketones, ur: NEGATIVE mg/dL
Nitrite: POSITIVE — AB
Protein, ur: 100 mg/dL — AB
Specific Gravity, Urine: >1.03 — ABNORMAL HIGH (ref 1.005–1.030)
pH: 5 (ref 5.0–8.0)

## 2021-07-18 MED ORDER — PHENAZOPYRIDINE HCL 200 MG PO TABS: 200.0000 mg | ORAL_TABLET | Freq: Three times a day (TID) | ORAL | 0 refills | Status: DC

## 2021-07-18 MED ORDER — CIPROFLOXACIN HCL 500 MG PO TABS: 500.0000 mg | ORAL_TABLET | Freq: Two times a day (BID) | ORAL | 0 refills | Status: AC

## 2021-07-18 NOTE — Discharge Instructions (Addendum)
Take the Cipro twice daily for 7 days with food for treatment of your complicated urinary tract infection.  Use the Pyridium every 8 hours as needed for urinary discomfort.  This will turn your urine a bright red-orange.  Increase your oral fluid intake so that you increase your urine production and or flushing your urinary system.  Take an over-the-counter probiotic, such as Culturelle-Align-Activia, 1 hour after each dose of antibiotic to prevent diarrhea or yeast infections from forming.  We will culture urine and change the antibiotics if necessary.  Return for reevaluation, or see your primary care provider, for any new or worsening symptoms.

## 2021-07-18 NOTE — ED Provider Notes (Signed)
[facility]    CSN: [id number] Arrival date & time: 07/18/21  0801      History   Chief Complaint Chief Complaint  Patient presents with   Dysuria    HPI Ariel Alvarez is a 75 y.o. female.   HPI  75 year old female here for evaluation of painful urination.  Patient reports that she was in the emergency department over the weekend because she suffered a fall.  While in the emergency department she was outfitted with a blue banana urinary suction device due to her Heema plegia and mobility issues to better manage her elimination needs.  She states that since then she has had painful urination with urinary urgency and frequency.  She indicates that she is voiding every 30 minutes and she is also having increased nocturia.  She does endorse thoracic back pain and suprapubic pain.  She denies any blood in her urine, fever, nausea or vomiting.  Patient has been indicated that these are her common symptoms when she has a UTI.  Past Medical History:  Diagnosis Date   Anxiety    Arthritis    RIGHT HAND   Collagen vascular disease (HCC)    Complication of anesthesia    HARD TO WAKE UP AFTER  ONE SURGERY-PT STAETS SHE WAS GIVEN TOO MUCH ANESTHESIA   Depression    Dyspnea    VERY RARE WITH EXERTION   GERD (gastroesophageal reflux disease)    History of kidney stones    H/O   Hypothyroidism    Stroke (HCC) 8676,7209   X2-LEFT HAD PARALYZED     Patient Active Problem List   Diagnosis Date Noted   Hyperlipidemia 12/08/2019   Stroke (HCC) 12/08/2019   Ulcerated, foot, unspecified laterality, limited to breakdown of skin (HCC) 12/08/2019   Collagen vascular disease (HCC) 05/21/2017   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      Home Medications    Prior to Admission medications   Medication Sig Start Date End Date Taking? Authorizing Provider  ciprofloxacin (CIPRO) 500 MG tablet Take 1 tablet (500 mg total) by mouth 2 (two) times daily for 7 days. 07/18/21 07/25/21 Yes Becky Augusta, NP  phenazopyridine (PYRIDIUM) 200 MG tablet Take 1 tablet (200 mg total) by mouth 3 (three) times daily. 07/18/21  Yes Becky Augusta, NP  acetaminophen (TYLENOL) 325 MG tablet Take 325 mg by mouth every 6 (six) hours as needed.  12/22/08   [provider]  albuterol (VENTOLIN HFA) 108 (90 Base) MCG/ACT inhaler Inhale 1-2 puffs into the lungs every 6 (six) hours as needed.  09/30/18   [provider]  apixaban (ELIQUIS) 5 MG TABS tablet Take 5 mg by mouth 2 (two) times daily.  11/26/19   [provider]  aspirin 81 MG EC tablet Take 81 mg by mouth daily.     [provider]  atorvastatin (LIPITOR) 40 MG tablet Take 40 mg by mouth every morning.     [provider]  cetirizine (ZYRTEC) 10 MG tablet Take 10 mg by mouth daily after lunch.     [provider]  Cholecalciferol 1.25 MG (50000 UT) capsule Take 50,000 Units by mouth daily.    [provider]  clonazePAM (KLONOPIN) 1 MG tablet Take 2 mg by mouth at bedtime.     [provider]  clotrimazole (LOTRIMIN) 1 % cream Apply to affected area 2 times daily 05/16/21   Eusebio Friendly B, PA-C  desvenlafaxine (PRISTIQ) 50 MG 24 hr tablet Take 50 mg by mouth daily. 06/15/21   [provider]  diphenhydramine-acetaminophen (TYLENOL PM) 25-500 MG TABS tablet Take 1 tablet by mouth at bedtime.     [provider]  fluconazole (DIFLUCAN) 150 MG tablet Take 1 tab PO q72h PRN yeast infection 05/16/21   Eusebio Friendly B, PA-C  fluticasone (FLONASE) 50 MCG/ACT nasal spray Place into the nose. 12/31/19    [provider]  folic acid (FOLVITE) 1 MG tablet Take by mouth. 08/23/20   [provider]  gabapentin (NEURONTIN) 300 MG capsule Take 300 mg by mouth 2 (two) times daily. 2 TABS IN AM AND 2 TABS IN PM 03/12/17   [provider]  hydroxychloroquine (PLAQUENIL) 200 MG tablet Take 400 mg by mouth daily.     [provider]  levothyroxine (SYNTHROID) 100 MCG tablet Take 100 mcg by mouth daily before breakfast. 08/05/20   [provider]  levothyroxine (SYNTHROID, LEVOTHROID) 100 MCG tablet Take by mouth. 04/19/17 05/16/21  [provider]  Lifitegrast 5 % SOLN Xiidra 5 % eye drops in a dropperette  PLACE 1 DROP IN OU BID    [provider]  lubiprostone (AMITIZA) 24 MCG capsule Take 24 mcg by mouth 2 (two) times daily with a meal.     [provider]  metFORMIN (GLUCOPHAGE) 500 MG tablet Take 1 tablet by mouth 2 (two) times daily. 02/13/21   [provider]  methocarbamol (ROBAXIN) 500 MG tablet Take 500 mg by mouth. 06/27/21   [provider]  methotrexate (RHEUMATREX) 2.5 MG tablet Take by mouth. 03/13/21   [provider]  nystatin (MYCOSTATIN/NYSTOP) 100000 UNIT/GM POWD Apply to the affected areas 2 to 3 times daily until healing is complete 08/02/15   Renford Dills, NP  nystatin (MYCOSTATIN/NYSTOP) powder Nystop 100,000 unit/gram topical powder  APPLY TO AFFECTED AREA 3 TIMES DAILY    [provider]  omeprazole (PRILOSEC) 20 MG capsule Take 20 mg by mouth every morning.     [provider]  oxyCODONE (ROXICODONE) 5 MG immediate release tablet Take 1 tablet (5 mg total) by mouth every 8 (eight) hours as needed. 07/11/21 07/11/22  Nita Sickle, MD  pantoprazole (PROTONIX) 40 MG tablet Take 1 tablet by mouth daily. 12/26/20   [provider]  polyethylene glycol powder (GLYCOLAX/MIRALAX) 17 GM/SCOOP powder Take by mouth. 04/07/20   [provider]  pravastatin  (PRAVACHOL) 20 MG tablet Take 20 mg by mouth daily.    [provider]  PRAVASTATIN SODIUM PO Take by mouth.     [provider]  pregabalin (LYRICA) 75 MG capsule Take by mouth. 07/06/20   [provider]  rOPINIRole (REQUIP) 0.25 MG tablet Take by mouth. 06/25/21   [provider]  sertraline (ZOLOFT) 50 MG tablet Take by mouth. 05/23/21   [provider]  tamsulosin (FLOMAX) 0.4 MG CAPS capsule Take 0.4 mg by mouth daily. 07/05/21   [provider]  tiZANidine (ZANAFLEX) 4 MG capsule Take 4 mg by mouth 3 (three) times daily.     [provider]  traZODone (DESYREL) 50 MG  tablet Take 25 mg by mouth at bedtime as needed. 03/23/21   [provider]  venlafaxine XR (EFFEXOR-XR) 150 MG 24 hr capsule venlafaxine hcl er 150 mg cp24    [provider]  eszopiclone (LUNESTA) 2 MG TABS tablet Take 2 mg by mouth at bedtime as needed for sleep. Take immediately before bedtime Patient not taking: Reported on 04/14/2020  09/08/20  [provider]  phentermine 30 MG capsule Take 30 mg by mouth every morning.  09/08/20  [provider]  warfarin (COUMADIN) 2.5 MG tablet Take 2.5 mg by mouth. Patient not taking: Reported on 04/14/2020  09/08/20  [provider]    Family History Family History  Problem Relation Age of Onset   Diabetes Son    Cancer Paternal Aunt    Stroke Paternal Grandmother     Social History Social History   Tobacco Use   Smoking status: Former    Packs/day: 1.00    Years: 20.00    Pack years: 20.00    Types: Cigarettes    Quit date: 10/29/1974    Years since quitting: 46.7   Smokeless tobacco: Never  Vaping Use   Vaping Use: Never used  Substance Use Topics   Alcohol use: Yes    Comment: WINE OCC   Drug use: No     Allergies   Latex and Prednisone   Review of Systems Review of Systems  Constitutional:  Negative for activity change, appetite change and fever.   Gastrointestinal:  Positive for abdominal pain. Negative for nausea and vomiting.  Genitourinary:  Positive for dysuria, frequency and urgency. Negative for flank pain and hematuria.  Musculoskeletal:  Positive for back pain.  Hematological: Negative.   Psychiatric/Behavioral: Negative.      Physical Exam Triage Vital Signs ED Triage Vitals  Enc Vitals Group     BP      Pulse      Resp      Temp      Temp src      SpO2      Weight      Height      Head Circumference      Peak Flow      Pain Score      Pain Loc      Pain Edu?      Excl. in GC?    No data found.  Updated Vital Signs BP 131/84 (BP Location: Right Arm)   Pulse 85   Temp 98.4 F (36.9 C) (Oral)   Resp 18   SpO2 90% Comment: pt reports hx of low oxygen she believes from gel nails  Visual Acuity Right Eye Distance:   Left Eye Distance:   Bilateral Distance:    Right Eye Near:   Left Eye Near:    Bilateral Near:     Physical Exam Vitals and nursing note reviewed.  Constitutional:      General: She is not in acute distress.    Appearance: Normal appearance. She is obese. She is not ill-appearing.  HENT:     Head: Normocephalic and atraumatic.  Cardiovascular:     Rate and Rhythm: Normal rate and regular rhythm.     Pulses: Normal pulses.     Heart sounds: Normal heart sounds. No murmur heard.   No gallop.  Pulmonary:     Effort: Pulmonary effort is normal.     Breath sounds: Normal breath sounds. No wheezing, rhonchi or rales.  Abdominal:  Palpations: Abdomen is soft.     Tenderness: There is no abdominal tenderness. There is left CVA tenderness. There is no right CVA tenderness, guarding or rebound.  Skin:    General: Skin is warm and dry.     Capillary Refill: Capillary refill takes less than 2 seconds.     Findings: No erythema or rash.  Neurological:     General: No focal deficit present.     Mental Status: She is alert and oriented to person, place, and time.  Psychiatric:         Mood and Affect: Mood normal.        Behavior: Behavior normal.        Thought Content: Thought content normal.        Judgment: Judgment normal.     UC Treatments / Results  Labs (all labs ordered are listed, but only abnormal results are displayed) Labs Reviewed  URINALYSIS, COMPLETE (UACMP) WITH MICROSCOPIC - Abnormal; Notable for the following components:      Result Value   APPearance CLOUDY (*)    Specific Gravity, Urine >1.030 (*)    Hgb urine dipstick MODERATE (*)    Protein, ur 100 (*)    Nitrite POSITIVE (*)    Leukocytes,Ua MODERATE (*)    Bacteria, UA MANY (*)    All other components within normal limits  URINE CULTURE    EKG   Radiology No results found.  Procedures Procedures (including critical care time)  Medications Ordered in UC Medications - No data to display  Initial Impression / Assessment and Plan / UC Course  I have reviewed the triage vital signs and the nursing notes.  Pertinent labs & imaging results that were available during my care of the patient were reviewed by me and considered in my medical decision making (see chart for details).  Patient is a nontoxic-appearing 75 year old female here for evaluation of urinary complaints and began over the weekend after being in the ER and having a suction urinary elimination management device placed.  Initially she and her husband said it was a catheter but then she said that it was a device that had a sock over it.  She states that it was inserted in her and it is unclear if this was placed in her vagina or in along her perineum as designed.  Patient's physical exam reveals a benign cardiopulmonary exam with clear lung sounds in all fields.  Patient does have mild left-sided CVA tenderness.  Abdomen is protuberant but soft positive bowel sounds in all 4 quadrants.  She states that when I press on her suprapubic area it actually feels better and does not produce pain.  Patient is in a wheelchair secondary  to stroke and mobility issues with her left side.  Urine specimen collected at triage and is pending.  Urinalysis reveals a cloudy appearance with a high specific gravity greater than 1.030.  Moderate hemoglobin is present, 100 protein, nitrite positive, moderate leukocytes, 21-50 WBCs, and many bacteria.  We will send urine for culture.  Will treat patient for complicated UTI as it is unclear if this is catheter associated or not.  We will treat with Cipro twice daily for 7 days.  We will also prescribe Pyridium to help with the urinary discomfort.  Urine culture is pending and antibiotic therapy can be tailored based on the results.  There is contraindication to using Cipro with the tizanidine so I will have patient hold tizanidine while she is taking the  Cipro.  Initial plan was for Bactrim but there is a contraindication to using Bactrim with her methotrexate.   Final Clinical Impressions(s) / UC Diagnoses   Final diagnoses:  Complicated UTI (urinary tract infection)     Discharge Instructions      Take the Cipro twice daily for 7 days with food for treatment of your complicated urinary tract infection.  Use the Pyridium every 8 hours as needed for urinary discomfort.  This will turn your urine a bright red-orange.  Increase your oral fluid intake so that you increase your urine production and or flushing your urinary system.  Take an over-the-counter probiotic, such as Culturelle-Align-Activia, 1 hour after each dose of antibiotic to prevent diarrhea or yeast infections from forming.  We will culture urine and change the antibiotics if necessary.  Return for reevaluation, or see your primary care provider, for any new or worsening symptoms.      ED Prescriptions     Medication Sig Dispense Auth. Provider   ciprofloxacin (CIPRO) 500 MG tablet Take 1 tablet (500 mg total) by mouth 2 (two) times daily for 7 days. 14 tablet Becky Augusta, NP   phenazopyridine (PYRIDIUM) 200 MG  tablet Take 1 tablet (200 mg total) by mouth 3 (three) times daily. 6 tablet Becky Augusta, NP      PDMP not reviewed this encounter.   Becky Augusta, NP 07/18/21 [id number]

## 2021-07-18 NOTE — ED Triage Notes (Signed)
Pt presents today with husband. She c/o of dysuria after having a catheter inserted in Emergency Room over weekend where she was seen for a fall.

## 2021-07-21 LAB — URINE CULTURE: Culture: >=100000 — AB

## 2021-09-04 ENCOUNTER — Ambulatory Visit: Payer: Self-pay | Admitting: Psychiatry

## 2021-09-15 ENCOUNTER — Encounter: Payer: Self-pay | Admitting: Emergency Medicine

## 2021-09-15 ENCOUNTER — Emergency Department
Admission: EM | Admit: 2021-09-15 | Discharge: 2021-09-20 | Disposition: A | Discharge status: Home / Self Care | Payer: Medicare PPO | Attending: Emergency Medicine | Admitting: Emergency Medicine

## 2021-09-15 ENCOUNTER — Emergency Department: Payer: Medicare PPO

## 2021-09-15 ENCOUNTER — Other Ambulatory Visit: Payer: Self-pay

## 2021-09-15 DIAGNOSIS — Z79899 Other long term (current) drug therapy: Secondary | ICD-10-CM | POA: Diagnosis not present

## 2021-09-15 DIAGNOSIS — Z87891 Personal history of nicotine dependence: Secondary | ICD-10-CM | POA: Insufficient documentation

## 2021-09-15 DIAGNOSIS — R002 Palpitations: Secondary | ICD-10-CM | POA: Diagnosis not present

## 2021-09-15 DIAGNOSIS — G8192 Hemiplegia, unspecified affecting left dominant side: Secondary | ICD-10-CM | POA: Diagnosis not present

## 2021-09-15 DIAGNOSIS — Z7982 Long term (current) use of aspirin: Secondary | ICD-10-CM | POA: Insufficient documentation

## 2021-09-15 DIAGNOSIS — Z7901 Long term (current) use of anticoagulants: Secondary | ICD-10-CM | POA: Diagnosis not present

## 2021-09-15 DIAGNOSIS — Z91040 Latex allergy status: Secondary | ICD-10-CM | POA: Insufficient documentation

## 2021-09-15 DIAGNOSIS — Z8673 Personal history of transient ischemic attack (TIA), and cerebral infarction without residual deficits: Secondary | ICD-10-CM | POA: Insufficient documentation

## 2021-09-15 DIAGNOSIS — Z20822 Contact with and (suspected) exposure to covid-19: Secondary | ICD-10-CM | POA: Insufficient documentation

## 2021-09-15 DIAGNOSIS — R0602 Shortness of breath: Secondary | ICD-10-CM | POA: Diagnosis not present

## 2021-09-15 DIAGNOSIS — E039 Hypothyroidism, unspecified: Secondary | ICD-10-CM | POA: Diagnosis not present

## 2021-09-15 DIAGNOSIS — G8194 Hemiplegia, unspecified affecting left nondominant side: Secondary | ICD-10-CM

## 2021-09-15 DIAGNOSIS — R079 Chest pain, unspecified: Secondary | ICD-10-CM | POA: Diagnosis not present

## 2021-09-15 LAB — TROPONIN I (HIGH SENSITIVITY)
Troponin I (High Sensitivity): 4 ng/L (ref <18)
Troponin I (High Sensitivity): 4 ng/L (ref <18)

## 2021-09-15 LAB — CBC
HCT: 40.2 % (ref 36.0–46.0)
Hemoglobin: 12.5 g/dL (ref 12.0–15.0)
MCH: 30.1 pg (ref 26.0–34.0)
MCHC: 31.1 g/dL (ref 30.0–36.0)
MCV: 96.9 fL (ref 80.0–100.0)
Platelets: 276 10*3/uL (ref 150–400)
RBC: 4.15 MIL/uL (ref 3.87–5.11)
RDW: 16.2 % — ABNORMAL HIGH (ref 11.5–15.5)
WBC: 5.8 10*3/uL (ref 4.0–10.5)
nRBC: 0 % (ref 0.0–0.2)

## 2021-09-15 LAB — URINALYSIS, ROUTINE W REFLEX MICROSCOPIC
Bilirubin Urine: NEGATIVE
Glucose, UA: NEGATIVE mg/dL
Hgb urine dipstick: NEGATIVE
Ketones, ur: NEGATIVE mg/dL
Leukocytes,Ua: NEGATIVE
Nitrite: NEGATIVE
Protein, ur: NEGATIVE mg/dL
Specific Gravity, Urine: 1.019 (ref 1.005–1.030)
pH: 5 (ref 5.0–8.0)

## 2021-09-15 LAB — BASIC METABOLIC PANEL
Anion gap: 7 (ref 5–15)
BUN: 8 mg/dL (ref 8–23)
CO2: 31 mmol/L (ref 22–32)
Calcium: 8.9 mg/dL (ref 8.9–10.3)
Chloride: 105 mmol/L (ref 98–111)
Creatinine, Ser: 0.7 mg/dL (ref 0.44–1.00)
GFR, Estimated: >60 mL/min (ref >60)
Glucose, Bld: 149 mg/dL — ABNORMAL HIGH (ref 70–99)
Potassium: 4 mmol/L (ref 3.5–5.1)
Sodium: 143 mmol/L (ref 135–145)

## 2021-09-15 LAB — RESP PANEL BY RT-PCR (FLU A&B, COVID) ARPGX2
Influenza A by PCR: NEGATIVE
Influenza B by PCR: NEGATIVE
SARS Coronavirus 2 by RT PCR: NEGATIVE

## 2021-09-15 NOTE — ED Triage Notes (Signed)
Pt here via EMS with c/o "cp/shob" and anxiety per ems. 2L placed per [place], sats without oxygen 90%; bg 161; bp wnl; hx stroke, unable to use left arm post cva; poss uti per ems, pt has been on antibiotics, fell asleep while standing yesterday and fell in bathtub, no injuries noted. Alert and oriented. Placed in wheelchair in lobby.

## 2021-09-15 NOTE — ED Notes (Signed)
Pt cleaned and changed by nurse and tech. During changing pt had anxiety about everything and had to be redirected multiple times. Pt back in bed. NADN

## 2021-09-15 NOTE — ED Triage Notes (Addendum)
Pt comes into the ED via [facility] from home c/o central chest pain, SHOB, and now new onset abd pain.  Pt does have well known h/o anxiety.  O2 at home was at 90% so was placed on 2L nasal cannula.  Pt does admit to multiple falls yesterday and today.  Pt able to answer all questions at this time.

## 2021-09-15 NOTE — ED Notes (Signed)
Pt adjusted in bed and meal tray put away. Pt ate 75% of meal.
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
## 2021-09-15 NOTE — ED Notes (Signed)
Pt assisted to triage commode, up with 2 assist. Tolerated well.

## 2021-09-16 MED ORDER — METFORMIN HCL 500 MG PO TABS
500.0000 mg | ORAL_TABLET | Freq: Two times a day (BID) | ORAL | Status: DC
  Administered 2021-09-17 – 2021-09-20 (×7): 500 mg via ORAL
  Filled 2021-09-16 (×7): qty 1

## 2021-09-16 MED ORDER — ATORVASTATIN CALCIUM 20 MG PO TABS
40.0000 mg | ORAL_TABLET | Freq: Every day | ORAL | Status: DC
  Administered 2021-09-17 – 2021-09-20 (×4): 40 mg via ORAL
  Filled 2021-09-16 (×5): qty 2

## 2021-09-16 MED ORDER — ACETAMINOPHEN 500 MG PO TABS
1000.0000 mg | ORAL_TABLET | Freq: Four times a day (QID) | ORAL | Status: DC | PRN
  Administered 2021-09-17 – 2021-09-19 (×5): 1000 mg via ORAL
  Filled 2021-09-16 (×5): qty 2

## 2021-09-16 MED ORDER — FLUTICASONE PROPIONATE 50 MCG/ACT NA SUSP
2.0000 | Freq: Every day | NASAL | Status: DC | PRN
  Filled 2021-09-16: qty 16

## 2021-09-16 MED ORDER — METHOTREXATE 2.5 MG PO TABS: 17.5000 mg | ORAL_TABLET | ORAL | Status: DC

## 2021-09-16 MED ORDER — ASPIRIN EC 81 MG PO TBEC
81.0000 mg | DELAYED_RELEASE_TABLET | Freq: Every day | ORAL | Status: DC
  Administered 2021-09-16 – 2021-09-20 (×5): 81 mg via ORAL
  Filled 2021-09-16 (×5): qty 1

## 2021-09-16 MED ORDER — TAMSULOSIN HCL 0.4 MG PO CAPS
0.4000 mg | ORAL_CAPSULE | Freq: Every day | ORAL | Status: DC
  Administered 2021-09-16 – 2021-09-20 (×5): 0.4 mg via ORAL
  Filled 2021-09-16 (×5): qty 1

## 2021-09-16 MED ORDER — METFORMIN HCL 500 MG PO TABS
500.0000 mg | ORAL_TABLET | Freq: Two times a day (BID) | ORAL | Status: DC
  Administered 2021-09-16: 500 mg via ORAL
  Filled 2021-09-16: qty 1

## 2021-09-16 MED ORDER — VENLAFAXINE HCL ER 75 MG PO CP24
75.0000 mg | ORAL_CAPSULE | Freq: Every day | ORAL | Status: DC
  Administered 2021-09-16 – 2021-09-20 (×5): 75 mg via ORAL
  Filled 2021-09-16 (×6): qty 1

## 2021-09-16 MED ORDER — PANTOPRAZOLE SODIUM 40 MG PO TBEC
40.0000 mg | DELAYED_RELEASE_TABLET | Freq: Every day | ORAL | Status: DC
  Administered 2021-09-16 – 2021-09-20 (×5): 40 mg via ORAL
  Filled 2021-09-16 (×6): qty 1

## 2021-09-16 MED ORDER — ALBUTEROL SULFATE HFA 108 (90 BASE) MCG/ACT IN AERS: 1.0000 | INHALATION_SPRAY | Freq: Four times a day (QID) | RESPIRATORY_TRACT | Status: DC | PRN

## 2021-09-16 MED ORDER — LEVOTHYROXINE SODIUM 50 MCG PO TABS
100.0000 ug | ORAL_TABLET | Freq: Every day | ORAL | Status: DC
  Administered 2021-09-17 – 2021-09-19 (×3): 100 ug via ORAL
  Filled 2021-09-16 (×4): qty 2

## 2021-09-16 MED ORDER — GABAPENTIN 300 MG PO CAPS
600.0000 mg | ORAL_CAPSULE | Freq: Every day | ORAL | Status: DC
  Administered 2021-09-16 – 2021-09-19 (×4): 600 mg via ORAL
  Filled 2021-09-16 (×4): qty 2

## 2021-09-16 MED ORDER — LORATADINE 10 MG PO TABS
10.0000 mg | ORAL_TABLET | Freq: Every day | ORAL | Status: DC
  Administered 2021-09-16 – 2021-09-20 (×6): 10 mg via ORAL
  Filled 2021-09-16 (×6): qty 1

## 2021-09-16 MED ORDER — QUETIAPINE FUMARATE 25 MG PO TABS
50.0000 mg | ORAL_TABLET | Freq: Every day | ORAL | Status: DC
  Administered 2021-09-16 – 2021-09-19 (×4): 50 mg via ORAL
  Filled 2021-09-16 (×4): qty 2

## 2021-09-16 MED ORDER — FOLIC ACID 1 MG PO TABS
1.0000 mg | ORAL_TABLET | Freq: Every day | ORAL | Status: DC
  Administered 2021-09-16 – 2021-09-20 (×5): 1 mg via ORAL
  Filled 2021-09-16 (×5): qty 1

## 2021-09-16 MED ORDER — HYDROXYCHLOROQUINE SULFATE 200 MG PO TABS
200.0000 mg | ORAL_TABLET | Freq: Every day | ORAL | Status: DC
  Administered 2021-09-16 – 2021-09-20 (×4): 200 mg via ORAL
  Filled 2021-09-16 (×5): qty 1

## 2021-09-16 MED ORDER — CLONAZEPAM 0.5 MG PO TABS
1.0000 mg | ORAL_TABLET | Freq: Every day | ORAL | Status: DC | PRN
  Administered 2021-09-16 – 2021-09-19 (×3): 1 mg via ORAL
  Filled 2021-09-16 (×3): qty 2

## 2021-09-16 MED ORDER — ROPINIROLE HCL 0.25 MG PO TABS
0.2500 mg | ORAL_TABLET | Freq: Every day | ORAL | Status: DC
  Administered 2021-09-17: 0.25 mg via ORAL
  Filled 2021-09-16 (×5): qty 1

## 2021-09-16 MED ORDER — ATORVASTATIN CALCIUM 20 MG PO TABS
40.0000 mg | ORAL_TABLET | ORAL | Status: DC
  Administered 2021-09-16: 40 mg via ORAL
  Filled 2021-09-16: qty 2

## 2021-09-16 MED ORDER — ALBUTEROL SULFATE (2.5 MG/3ML) 0.083% IN NEBU: 2.5000 mg | INHALATION_SOLUTION | Freq: Four times a day (QID) | RESPIRATORY_TRACT | Status: DC | PRN

## 2021-09-16 MED ORDER — LEVOTHYROXINE SODIUM 50 MCG PO TABS
100.0000 ug | ORAL_TABLET | Freq: Every day | ORAL | Status: DC
  Administered 2021-09-16: 100 ug via ORAL
  Filled 2021-09-16: qty 2

## 2021-09-16 MED ORDER — APIXABAN 5 MG PO TABS
5.0000 mg | ORAL_TABLET | Freq: Two times a day (BID) | ORAL | Status: DC
  Administered 2021-09-16 – 2021-09-20 (×9): 5 mg via ORAL
  Filled 2021-09-16 (×9): qty 1

## 2021-09-16 NOTE — ED Notes (Signed)
Pt was placed on toilet by another RN for BM.
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
## 2021-09-16 NOTE — ED Notes (Signed)
RN to bedside to answer call bell again. Pt is requesting staff to change her from hospital gown to her own clothes. Rn advised I am to busy to assist.

## 2021-09-16 NOTE — ED Notes (Signed)
Pt ambulated with assistance back to bed. Clean brief and purewick back in place.

## 2021-09-16 NOTE — ED Notes (Signed)
Rn to bedside to place pt on bed pan to have BM.

## 2021-09-16 NOTE — ED Notes (Signed)
RN to bedside to answer call bell. Pt asking for breakfast. Pt advised it would come around 9AM. Pt in no acute distress.

## 2021-09-16 NOTE — ED Notes (Signed)
Assisted pt back to bed. Clean chuck and brief in place.

## 2021-09-16 NOTE — ED Notes (Signed)
RN cleaned patient. Placed new linens.

## 2021-09-16 NOTE — ED Notes (Signed)
Rn in to answer call bell. Pt sttaes "I need to use the bathroom". Pt was informed she is still on a purewick and is free to do so.

## 2021-09-16 NOTE — ED Notes (Signed)
Provided pt with a warm blanket no other needs at present  

## 2021-09-16 NOTE — ED Provider Notes (Signed)
Today's Vitals   09/15/21 1308 09/15/21 1323 09/15/21 1640 09/15/21 1926  BP:  134/67 137/68 138/70  Pulse:  76 80 79  Resp:  (!) 22 19 19   Temp:  98 F (36.7 C) 97.6 F (36.4 C)   TempSrc:  Oral Oral   SpO2:  97% 99% 100%  Weight: 121.6 kg     Height: 5\' 3"  (1.6 m)     PainSc:   7  0-No pain   Body mass index is 47.49 kg/m.   Patient is resting comfortably.  She is complaining of pain from her arthritis and is requesting that her home medications to be ordered.  She is awaiting social work evaluation for further disposition.  I have reviewed her records from her most recent primary care doctor visit at the [facility] clinic in October 2022.  We will reorder her home meds based on this information.   Leotha Westermeyer, [facility], DO 09/16/21 [phone]

## 2021-09-16 NOTE — ED Notes (Signed)
Rn to answer call bell with a warm blanket.

## 2021-09-16 NOTE — TOC Progression Note (Signed)
Transition of Care [facility]) - Progression Note    Patient Details  Name: Ariel Alvarez MRN: [id number] Date of Birth: Oct 01, 1946  Transition of Care [facility]) CM/SW Contact  Maud Deed, [place] Phone Number: 09/16/2021, 3:46 PM  Clinical Narrative:    Pt called and notified CSW that the facility was [facility], CSW explained that [facility] was an assisted living and she was in need of a SNF and pt was agreeable to other facilities. CSW completed FL2 and faxed to surrounding facilities.   Expected Discharge Plan: Skilled Nursing Facility Barriers to Discharge: Continued Medical Work up, No SNF bed  Expected Discharge Plan and Services Expected Discharge Plan: Skilled Nursing Facility     Post Acute Care Choice: Skilled Nursing Facility Living arrangements for the past 2 months: Single Family Home                                       Social Determinants of Health (SDOH) Interventions    Readmission Risk Interventions No flowsheet data found.

## 2021-09-16 NOTE — ED Notes (Signed)
Social work Microbiologist this Charity fundraiser and called this Charity fundraiser on the phone and advised the pt had called them from her cell phone in her room a nd complained about RN not answering  call bell. Rn advised social work I am answering call bell as noted in chart.

## 2021-09-16 NOTE — ED Notes (Signed)
Rn to bedside to answer call bed to adjust pt head of the bed.

## 2021-09-16 NOTE — ED Notes (Signed)
OT to bedside

## 2021-09-16 NOTE — NC FL2 (Signed)
[place] MEDICAID FL2 LEVEL OF CARE SCREENING TOOL     IDENTIFICATION  Patient Name: Ariel Alvarez Birthdate: 1946/06/27 Sex: female Admission Date (Current Location): 09/15/2021  [facility] [place] Number:  Chiropodist and Address:  [facility], [address], [place], [place] [address]      Provider Number: [id number]  Attending Physician Name and Address:  No att. 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) 05/21/2017    Orientation RESPIRATION BLADDER Height & Weight     Self, Time, Situation, Place  Normal Continent, External catheter Weight: 268 lb 1.3 oz (121.6 kg) Height:  5\' 3"  (160 cm)  BEHAVIORAL SYMPTOMS/MOOD NEUROLOGICAL BOWEL NUTRITION STATUS      Continent    AMBULATORY STATUS COMMUNICATION OF NEEDS Skin   Limited Assist Verbally Normal                       Personal Care Assistance Level of Assistance  Bathing, Feeding, Dressing Bathing Assistance: Limited assistance Feeding assistance: Independent Dressing Assistance: Limited assistance     Functional Limitations Info  Sight, Hearing, Speech Sight Info: Adequate Hearing Info: Adequate Speech Info: Adequate    SPECIAL CARE FACTORS FREQUENCY  PT (By licensed PT), OT (By licensed OT)     PT Frequency: 5x week OT Frequency: 5x week            Contractures Contractures Info: Present (Left side)    Additional Factors Info  Code  Status, Allergies   Allergies Info: Latex, prednisone           Current Medications (09/16/2021):  This is the current hospital active medication list Current Facility-Administered Medications  Medication Dose Route Frequency Provider Last Rate Last Admin   acetaminophen (TYLENOL) tablet 1,000 mg  1,000 mg Oral Q6H PRN Ward, Kristen N, DO       albuterol (PROVENTIL) (2.5 MG/3ML) 0.083% nebulizer solution 2.5 mg  2.5 mg Nebulization Q6H PRN Ward, Kristen N, DO       apixaban (ELIQUIS) tablet 5 mg  5 mg Oral BID Ward, Kristen N, DO   5 mg at 09/16/21 1338   aspirin EC tablet 81 mg  81 mg Oral Daily Ward, Kristen N, DO       atorvastatin (LIPITOR) tablet 40 mg  40 mg Oral Daily Ward, Kristen N, DO       clonazePAM (KLONOPIN) tablet 1 mg  1 mg Oral Daily PRN Ward, Kristen N, DO   1 mg at 09/16/21 1338   fluticasone (FLONASE) 50 MCG/ACT nasal spray 2 spray  2 spray Each Nare Daily PRN Ward, Kristen N, DO       folic acid (FOLVITE) tablet 1 mg  1 mg Oral Daily Ward, Kristen N, DO   1 mg at 09/16/21 1010   gabapentin (NEURONTIN) capsule 600 mg  600 mg Oral QHS Ward, Kristen N, DO       hydroxychloroquine (PLAQUENIL) tablet 200 mg  200  mg Oral Daily Ward, Kristen N, DO       [START ON 09/17/2021] levothyroxine (SYNTHROID) tablet 100 mcg  100 mcg Oral Q0600 Ward, Layla Maw, DO       loratadine (CLARITIN) tablet 10 mg  10 mg Oral Daily Ward, Kristen N, DO   10 mg at 09/16/21 1010   metFORMIN (GLUCOPHAGE) tablet 500 mg  500 mg Oral BID WC Ward, Kristen N, DO       pantoprazole (PROTONIX) EC tablet 40 mg  40 mg Oral Daily Ward, Kristen N, DO   40 mg at 09/16/21 1011   QUEtiapine (SEROQUEL) tablet 50 mg  50 mg Oral QHS Ward, Kristen N, DO       tamsulosin (FLOMAX) capsule 0.4 mg  0.4 mg Oral Daily Ward, Kristen N, DO   0.4 mg at 09/16/21 1012   venlafaxine XR (EFFEXOR-XR) 24 hr capsule 75 mg  75 mg Oral Q breakfast Ward, Layla Maw, DO       Current Outpatient Medications  Medication Sig Dispense  Refill   apixaban (ELIQUIS) 5 MG TABS tablet Take 5 mg by mouth 2 (two) times daily.      atorvastatin (LIPITOR) 40 MG tablet Take 40 mg by mouth every morning.      clonazePAM (KLONOPIN) 1 MG tablet Take 1 tablet by mouth daily as needed.     clotrimazole (LOTRIMIN) 1 % cream Apply to affected area 2 times daily 28 g 1   folic acid (FOLVITE) 1 MG tablet Take by mouth.     hydroxychloroquine (PLAQUENIL) 200 MG tablet Take 400 mg by mouth daily.      levothyroxine (SYNTHROID) 100 MCG tablet Take 100 mcg by mouth daily before breakfast.     metFORMIN (GLUCOPHAGE) 500 MG tablet Take 1 tablet by mouth 2 (two) times daily.     omeprazole (PRILOSEC) 20 MG capsule Take 20 mg by mouth every morning.      QUEtiapine (SEROQUEL) 50 MG tablet Take 1 tablet by mouth at bedtime.     acetaminophen (TYLENOL) 325 MG tablet Take 325 mg by mouth every 6 (six) hours as needed.      albuterol (VENTOLIN HFA) 108 (90 Base) MCG/ACT inhaler Inhale 1-2 puffs into the lungs every 6 (six) hours as needed.      aspirin 81 MG EC tablet Take 81 mg by mouth daily.      cetirizine (ZYRTEC) 10 MG tablet Take 10 mg by mouth daily after lunch.      chlorhexidine (PERIDEX) 0.12 % solution 15 mLs by Mouth Rinse route in the morning and at bedtime.     Cholecalciferol 1.25 MG (50000 UT) capsule Take 50,000 Units by mouth daily.     clonazePAM (KLONOPIN) 1 MG tablet Take 2 mg by mouth at bedtime.      desvenlafaxine (PRISTIQ) 50 MG 24 hr tablet Take 50 mg by mouth daily.     diphenhydramine-acetaminophen (TYLENOL PM) 25-500 MG TABS tablet Take 1 tablet by mouth at bedtime.      fluconazole (DIFLUCAN) 150 MG tablet Take 1 tab PO q72h PRN yeast infection (Patient not taking: Reported on 09/16/2021) 2 tablet 0   fluticasone (FLONASE) 50 MCG/ACT nasal spray Place into the nose.     gabapentin (NEURONTIN) 300 MG capsule Take 300 mg by mouth 2 (two) times daily. 2 TABS IN AM AND 2 TABS IN PM  0   hydrOXYzine (ATARAX/VISTARIL) 10 MG tablet  Take 10 mg by mouth 3 (three) times daily  as needed.     levothyroxine (SYNTHROID, LEVOTHROID) 100 MCG tablet Take by mouth.     Lifitegrast 5 % SOLN Xiidra 5 % eye drops in a dropperette  PLACE 1 DROP IN OU BID     lubiprostone (AMITIZA) 24 MCG capsule Take 24 mcg by mouth 2 (two) times daily with a meal.      methocarbamol (ROBAXIN) 500 MG tablet Take 500 mg by mouth. (Patient not taking: Reported on 09/16/2021)     methotrexate (RHEUMATREX) 2.5 MG tablet Take by mouth.     nystatin (MYCOSTATIN/NYSTOP) 100000 UNIT/GM POWD Apply to the affected areas 2 to 3 times daily until healing is complete (Patient not taking: Reported on 09/16/2021) 1 Bottle 0   nystatin (MYCOSTATIN/NYSTOP) powder Nystop 100,000 unit/gram topical powder  APPLY TO AFFECTED AREA 3 TIMES DAILY     oxyCODONE (ROXICODONE) 5 MG immediate release tablet Take 1 tablet (5 mg total) by mouth every 8 (eight) hours as needed. 20 tablet 0   pantoprazole (PROTONIX) 40 MG tablet Take 1 tablet by mouth daily.     phenazopyridine (PYRIDIUM) 200 MG tablet Take 1 tablet (200 mg total) by mouth 3 (three) times daily. 6 tablet 0   polyethylene glycol powder (GLYCOLAX/MIRALAX) 17 GM/SCOOP powder Take by mouth.     pravastatin (PRAVACHOL) 20 MG tablet Take 20 mg by mouth daily.     pregabalin (LYRICA) 75 MG capsule Take by mouth.     rOPINIRole (REQUIP) 0.25 MG tablet Take by mouth. (Patient not taking: Reported on 09/16/2021)     sertraline (ZOLOFT) 50 MG tablet Take by mouth.     tamsulosin (FLOMAX) 0.4 MG CAPS capsule Take 0.4 mg by mouth daily. (Patient not taking: Reported on 09/16/2021)     tiZANidine (ZANAFLEX) 4 MG capsule Take 4 mg by mouth 3 (three) times daily.      traZODone (DESYREL) 50 MG tablet Take 25 mg by mouth at bedtime as needed.     venlafaxine XR (EFFEXOR-XR) 150 MG 24 hr capsule venlafaxine hcl er 150 mg cp24       Discharge Medications: Please see discharge summary for a list of discharge medications.  Relevant  Imaging Results:  Relevant Lab Results:   Additional Information SS: [id number]  Maud Deed, [name]

## 2021-09-16 NOTE — ED Notes (Signed)
Rn to bedside to answer call bell. Pt request her lunch tray be taken away.

## 2021-09-16 NOTE — ED Notes (Signed)
Rn to bedside to answer call bell again. Pt asked to lay the head of her bed down.

## 2021-09-16 NOTE — ED Notes (Signed)
Rn to bedside to answer call bell again. Pt asked could a friend bring her lunch. RN said sure why not.

## 2021-09-16 NOTE — ED Notes (Signed)
Pt changed and adjusted in bed.

## 2021-09-16 NOTE — ED Notes (Signed)
RN in to answer call bell and give food tray.

## 2021-09-16 NOTE — ED Notes (Signed)
Meal tray given to pt.

## 2021-09-16 NOTE — TOC Initial Note (Signed)
Transition of Care [facility]) - Initial/Assessment Note    Patient Details  Name: Ariel Alvarez MRN: [id number] Date of Birth: 12/20/1945  [facility]) CM/SW Contact:    Boris Sharper, [name] Phone Number: 09/16/2021, 12:26 PM  Clinical Narrative:                 CSW was notified by PT that recommendation was SNF and pt is agreeable. CSW met with pt bedside and pt stated she did have a facility preference but cold not recall the name of it, waiting for family members to return her cl for th name of facility. CSW explained that the referral could be sent out to multiple facilities to see who has a bed available and pt was in agreement. Pt's fiance was transporting her to appointments and to get [facility] [place]. He is no longer involved and pt is unsure who will be transporting her since she does not drive.   Expected Discharge Plan: [place] Barriers to Discharge: Continued Medical Work up, No SNF bed   Patient Goals and CMS Choice Patient states their goals for this hospitalization and ongoing recovery are:: to recvover properly CMS Medicare.gov Compare Post Acute Care list provided to:: Patient Choice offered to / list presented to : Patient  Expected Discharge Plan and Services Expected Discharge Plan: [place] Choice: [place] arrangements for the past 2 months: Single Family Home                                      Prior Living Arrangements/Services Living arrangements for the past 2 months: Single Family Home Lives with:: Self Patient language and need for interpreter reviewed:: Yes        Need for Family Participation in Patient Care: Yes (Comment) Care giver support system in place?: Yes (comment) (friend)   Criminal Activity/Legal Involvement Pertinent to Current Situation/Hospitalization: No - Comment as needed  Activities of Daily Living       Permission Sought/Granted Permission sought to share information with : Facility Sport and exercise psychologist Permission granted to share information with : Yes, Release of Information Signed  Share Information with NAME: Vickii Chafe     Permission granted to share info w Relationship: Friend  Permission granted to share info w Contact Information: [phone]  Emotional Assessment Appearance:: Appears stated age Attitude/Demeanor/Rapport: Gracious, Engaged Affect (typically observed): Accepting, Calm Orientation: : Oriented to Self, Oriented to Place, Oriented to  Time, Oriented to Situation Alcohol / Substance Use: Not Applicable Psych Involvement: No (comment)  Admission diagnosis:  Chest Pain/Shob  EMS Patient Active Problem List   Diagnosis Date Noted   Hyperlipidemia 12/08/2019   Stroke ([place]) 12/08/2019   Ulcerated, foot, unspecified laterality, limited to breakdown of skin ([place]) 12/08/2019   Collagen vascular disease ([place]) 05/21/2017   Gangrene of finger of right hand ([place]) 05/21/2017   PCP:  Gladstone Lighter, MD Pharmacy:   [id number] [place] [place] [id number]: [phone]: [phone] [id number] [id number]: [phone]: [phone]   Social Determinants of Health (SDOH) Interventions    Readmission Risk Interventions No flowsheet data found.

## 2021-09-16 NOTE — ED Notes (Signed)
Rn in to answer call bell. Pt asking again about breakfast.

## 2021-09-16 NOTE — ED Notes (Signed)
Medic to room to answer call bell. Put patient on toilet to have a BM.

## 2021-09-16 NOTE — ED Notes (Signed)
Repositioned pt in bed, helped pt putting on her pajamas (bottoms) pt able to turn side to side. No other needs at present

## 2021-09-16 NOTE — ED Notes (Signed)
Rn to bedside to answer call bell again. Pt asked could she order her own meals from cafe. Rn advised her no. She was to receive the food that was provided to all ER boarder patients.

## 2021-09-16 NOTE — ED Notes (Signed)
Rn to bedside to answer call bell. Pt asking to be moved to chair to sit. RN advised that we would not be letting her sit in a chair due to she is a HIGH fall risk. Pt was slid up in bed and bed placed in high fowlers.
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
## 2021-09-18 NOTE — ED Notes (Signed)
Pt changed into clean brief, chucks, and pure wick

## 2021-09-18 NOTE — ED Notes (Signed)
Pt given meal tray.

## 2021-09-18 NOTE — ED Notes (Signed)
Pt transferred and repositioned from recliner to bed.

## 2021-09-18 NOTE — ED Provider Notes (Signed)
-----------------------------------------   6:53 AM on 09/18/2021 -----------------------------------------   Blood pressure 122/70, pulse 77, temperature 98 F (36.7 C), temperature source Oral, resp. rate 18, height 1.6 m (5\' 3" ), weight 121.6 kg, SpO2 98 %.  The patient is calm and cooperative at this time.  There have been no acute events since the last update.  Awaiting disposition plan from [facility] team.  Home meds were ordered yesterday.   [facility], MD 09/18/21 [phone]

## 2021-09-18 NOTE — Progress Notes (Signed)
Physical Therapy Treatment Patient Details Name: Ariel Alvarez MRN: [id number] DOB: 06/02/1946 Today's Date: 09/18/2021   History of Present Illness 75 year old female admitted for chest pain. History includes CVA with chronic L side hemiparesis, anxiety, and falls.    PT Comments    Patient agreeable to PT and wants to try getting out of the bed. Patient needs Max A for bed mobility. She performed multiple transfers including stand pivot transfer from stretcher to recliner and sit to stand transfers from recliner chair. Poor standing balance initially progressing to fair balance with increased standing time. Left knee blocked intermittently during transfers to prevent buckling as patient with chronic left side weakness. Patient reports she was ambulatory at baseline with a hemi-walker. She is not at her baseline level of functional mobility. Recommend SNF placement at discharged with ongoing PT to maximize independence and facilitate return to prior level of function.    Recommendations for follow up therapy are one component of a multi-disciplinary discharge planning process, led by the attending physician.  Recommendations may be updated based on patient status, additional functional criteria and insurance authorization.  Follow Up Recommendations  Skilled nursing-short term rehab (<3 hours/day)     Assistance Recommended at Discharge Frequent or constant Supervision/Assistance  Equipment Recommendations   (to be determined at next level of care)    Recommendations for Other Services       Precautions / Restrictions Precautions Precautions: Fall Restrictions Weight Bearing Restrictions: No     Mobility  Bed Mobility Overal bed mobility: Needs Assistance Bed Mobility: Supine to Sit     Supine to sit: Max assist     General bed mobility comments: assistance for trunk and BLE support. verbal cues for safety and technique. increased time and effort required     Transfers Overall transfer level: Needs assistance   Transfers: Sit to/from Stand;Bed to chair/wheelchair/BSC Sit to Stand: Mod assist Stand pivot transfers: Mod assist         General transfer comment: patient able to pivot from stretcher to recliner chair towards right side with left knee blocked to prevent buckling and verbal cues for technique. patient stood x 2 bouts from the recliner chair with lifting assistance to stand.    Ambulation/Gait         Gait velocity: patient reports she usually ambulates with a hemi-walker due to chronic left side weakness. unable to safely attempt due to poor standing tolerance and generalized weakness         Stairs             Wheelchair Mobility    Modified Rankin (Stroke Patients Only)       Balance Overall balance assessment: History of Falls;Needs assistance Sitting-balance support: Feet supported Sitting balance-Leahy Scale: Fair Sitting balance - Comments: no loss of balance with feet supported on the floor in recliner chair   Standing balance support: During functional activity;Single extremity supported Standing balance-Leahy Scale:  (poor to fair) Standing balance comment: standing balance improved with increased activity. poor initially with left knee blocked to prevent buckling                            Cognition Arousal/Alertness: Awake/alert Behavior During Therapy: WFL for tasks assessed/performed Overall Cognitive Status: Within Functional Limits for tasks assessed  General Comments: patient is able to follow commands with extra time        Exercises      General Comments General comments (skin integrity, edema, etc.): patient would benefit from a hospital bed for comfort and ease of transfers while in hospital- discussed this recommendation with nursing staff. patient sitting up in chair at end of session with LUE positioned on a pillow  for comfort. lunch tray set-up and assistance with opening packages      Pertinent Vitals/Pain Pain Assessment: No/denies pain    Home Living                          Prior Function            PT Goals (current goals can now be found in the care plan section) Acute Rehab PT Goals Patient Stated Goal: to go to rehab PT Goal Formulation: With patient Time For Goal Achievement: 09/30/21 Potential to Achieve Goals: Good Progress towards PT goals: Progressing toward goals    Frequency    Min 2X/week      PT Plan Current plan remains appropriate    Co-evaluation              AM-PAC PT "6 Clicks" Mobility   Outcome Measure  Help needed turning from your back to your side while in a flat bed without using bedrails?: A Lot Help needed moving from lying on your back to sitting on the side of a flat bed without using bedrails?: Total Help needed moving to and from a bed to a chair (including a wheelchair)?: Total Help needed standing up from a chair using your arms (e.g., wheelchair or bedside chair)?: Total Help needed to walk in hospital room?: Total Help needed climbing 3-5 steps with a railing? : Total 6 Click Score: 7    End of Session   Activity Tolerance: Patient tolerated treatment well Patient left: in chair;with chair alarm set (set-up with lunch tray) Nurse Communication: Mobility status PT Visit Diagnosis: Repeated falls (R29.6);Muscle weakness (generalized) (M62.81);History of falling (Z91.81);Difficulty in walking, not elsewhere classified (R26.2);Hemiplegia and hemiparesis Hemiplegia - Right/Left: Left Hemiplegia - dominant/non-dominant: Non-dominant Hemiplegia - caused by: Cerebral infarction     Time: 1310-1350 PT Time Calculation (min) (ACUTE ONLY): 40 min  Charges:  $Therapeutic Activity: 38-52 mins                     Donna Bernard, PT, MPT    Ariel Alvarez 09/18/2021, 2:31 PM

## 2021-09-18 NOTE — TOC Progression Note (Signed)
Transition of Care [facility]) - Progression Note    Patient Details  Name: Ariel Alvarez MRN: [id number] Date of Birth: 24-Jun-1946  Transition [facility] Care [facility]) CM/SW Contact  Hetty Ely, RN Phone Number: 09/18/2021, 1:06 PM  Clinical Narrative:  No bed offers, text for review sent to [facility], [facility], and [facility].    Expected Discharge Plan: Skilled Nursing Facility Barriers to Discharge: Continued Medical Work up, No SNF bed  Expected Discharge Plan and Services Expected Discharge Plan: Skilled Nursing Facility     Post Acute Care Choice: Skilled Nursing Facility Living arrangements for the past 2 months: Single Family Home                                       Social Determinants of Health (SDOH) Interventions    Readmission Risk Interventions No flowsheet data found.

## 2021-09-18 NOTE — ED Notes (Signed)
This RN assumed care of the patient at 2300. The patient has called out 3 times to the secretary. This RN addressed all 3 call bells and the patient was educated on the proper use of the call bell. Pt verbalized understanding of the instructions.

## 2021-09-18 NOTE — ED Notes (Signed)
Pt SpO2 Desatted to low 80's while pt asleep. This RN adjusted HOB, and increased O2 from 1L to 2L. Pt in NAD

## 2021-09-19 LAB — RESP PANEL BY RT-PCR (FLU A&B, COVID) ARPGX2
Influenza A by PCR: NEGATIVE
Influenza B by PCR: NEGATIVE
SARS Coronavirus 2 by RT PCR: NEGATIVE

## 2021-09-19 NOTE — TOC Progression Note (Signed)
Transition of Care [facility]) - Progression Note    Patient Details  Name: Ariel Alvarez MRN: [id number] Date of Birth: Apr 19, 1946  Transition of Care [facility]) CM/SW Contact  Allayne Butcher, RN Phone Number: 09/19/2021, 3:29 PM  Clinical Narrative:    [facility] has offered a bed and patient accepts bed offer.  [facility] can accept her tomorrow.  RNCM started insurance authorization through [facility].  Patient reports that her friend will bring her some clothes from home this afternoon to take over to [facility] with her.     Expected Discharge Plan: Skilled Nursing Facility Barriers to Discharge: Continued Medical Work up, No SNF bed  Expected Discharge Plan and Services Expected Discharge Plan: Skilled Nursing Facility     Post Acute Care Choice: Skilled Nursing Facility Living arrangements for the past 2 months: Single Family Home                                       Social Determinants of Health (SDOH) Interventions    Readmission Risk Interventions No flowsheet data found.

## 2021-09-19 NOTE — ED Notes (Signed)
Pt in bed, pt reports decreased pain.  °

## 2021-09-19 NOTE — NC FL2 (Signed)
[place] MEDICAID FL2 LEVEL OF CARE SCREENING TOOL     IDENTIFICATION  Patient Name: Ariel Alvarez Birthdate: February 09, 1946 Sex: female Admission Date (Current Location): 09/15/2021  [facility] and [place] Number:  Chiropodist and Address:  [facility], [address] [place], [place] [address]      Provider Number: [id number]  Attending Physician Name and Address:  No att. 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    Orientation RESPIRATION BLADDER Height & Weight     Self, Time, Situation, Place  Normal Continent, External catheter Weight: 121.6 kg Height:  5\' 3"  (160 cm)  BEHAVIORAL SYMPTOMS/MOOD NEUROLOGICAL BOWEL NUTRITION STATUS      Continent    AMBULATORY STATUS COMMUNICATION OF NEEDS Skin   Limited Assist Verbally Normal                       Personal Care Assistance Level of Assistance  Bathing, Feeding, Dressing Bathing Assistance: Limited assistance Feeding assistance: Independent Dressing Assistance: Limited assistance     Functional Limitations Info  Sight, Hearing, Speech Sight Info: Adequate Hearing Info: Adequate Speech Info: Adequate    SPECIAL CARE FACTORS FREQUENCY  PT (By licensed PT), OT (By licensed OT)     PT Frequency: 5x week OT Frequency: 5x week            Contractures Contractures Info: Present (Left side)    Additional Factors Info  Code Status, Allergies    Allergies Info: Latex, prednisone           Current Medications (09/19/2021):  This is the current hospital active medication list Current Facility-Administered Medications  Medication Dose Route Frequency Provider Last Rate Last Admin   acetaminophen (TYLENOL) tablet 1,000 mg  1,000 mg Oral Q6H PRN Ward, Kristen N, DO   1,000 mg at 09/19/21 1323   albuterol (PROVENTIL) (2.5 MG/3ML) 0.083% nebulizer solution 2.5 mg  2.5 mg Nebulization Q6H PRN Ward, Kristen N, DO       apixaban (ELIQUIS) tablet 5 mg  5 mg Oral BID Ward, Kristen N, DO   5 mg at 09/19/21 1114   aspirin EC tablet 81 mg  81 mg Oral Daily Ward, Kristen N, DO   81 mg at 09/19/21 1114   atorvastatin (LIPITOR) tablet 40 mg  40 mg Oral Daily Ward, Kristen N, DO   40 mg at 09/19/21 1114   clonazePAM (KLONOPIN) tablet 1 mg  1 mg Oral Daily PRN Ward, Kristen N, DO   1 mg at 09/17/21 0939   fluticasone (FLONASE) 50 MCG/ACT nasal spray 2 spray  2 spray Each Nare Daily PRN Ward, Kristen N, DO       folic acid (FOLVITE) tablet 1 mg  1 mg Oral Daily Ward, Kristen N, DO   1 mg at 09/19/21 1115   gabapentin (NEURONTIN) capsule 600 mg  600 mg Oral QHS Ward, Kristen N, DO   600 mg at 09/18/21 2100  hydroxychloroquine (PLAQUENIL) tablet 200 mg  200 mg Oral Daily Ward, Kristen N, DO   200 mg at 09/19/21 1115   levothyroxine (SYNTHROID) tablet 100 mcg  100 mcg Oral Q0600 Ward, Layla Maw, DO   100 mcg at 09/19/21 0647   loratadine (CLARITIN) tablet 10 mg  10 mg Oral Daily Ward, Kristen N, DO   10 mg at 09/19/21 1116   metFORMIN (GLUCOPHAGE) tablet 500 mg  500 mg Oral BID WC Ward, Kristen N, DO   500 mg at 09/19/21 0740   pantoprazole (PROTONIX) EC tablet 40 mg  40 mg Oral Daily Ward, Kristen N, DO   40 mg at 09/19/21 1116   QUEtiapine (SEROQUEL) tablet 50 mg  50 mg Oral QHS Ward, Kristen N, DO   50 mg at 09/18/21 2059   rOPINIRole (REQUIP) tablet 0.25 mg  0.25 mg Oral QHS Georga Hacking, MD   0.25 mg at 09/17/21 2200   tamsulosin (FLOMAX)  capsule 0.4 mg  0.4 mg Oral Daily Ward, Kristen N, DO   0.4 mg at 09/19/21 1116   venlafaxine XR (EFFEXOR-XR) 24 hr capsule 75 mg  75 mg Oral Q breakfast Ward, Kristen N, DO   75 mg at 09/19/21 0740   Current Outpatient Medications  Medication Sig Dispense Refill   apixaban (ELIQUIS) 5 MG TABS tablet Take 5 mg by mouth 2 (two) times daily.      aspirin 81 MG EC tablet Take 81 mg by mouth daily.      atorvastatin (LIPITOR) 40 MG tablet Take 40 mg by mouth every morning.      clonazePAM (KLONOPIN) 1 MG tablet Take 1 tablet by mouth daily as needed.     clotrimazole (LOTRIMIN) 1 % cream Apply to affected area 2 times daily 28 g 1   folic acid (FOLVITE) 1 MG tablet Take by mouth.     hydroxychloroquine (PLAQUENIL) 200 MG tablet Take 400 mg by mouth daily.      hydrOXYzine (ATARAX/VISTARIL) 10 MG tablet Take 10 mg by mouth 3 (three) times daily as needed.     levothyroxine (SYNTHROID) 100 MCG tablet Take 100 mcg by mouth daily before breakfast.     metFORMIN (GLUCOPHAGE) 500 MG tablet Take 1 tablet by mouth 2 (two) times daily.     omeprazole (PRILOSEC) 20 MG capsule Take 20 mg by mouth every morning.      QUEtiapine (SEROQUEL) 50 MG tablet Take 1 tablet by mouth at bedtime.     acetaminophen (TYLENOL) 325 MG tablet Take 325 mg by mouth every 6 (six) hours as needed.      albuterol (VENTOLIN HFA) 108 (90 Base) MCG/ACT inhaler Inhale 1-2 puffs into the lungs every 6 (six) hours as needed.      cetirizine (ZYRTEC) 10 MG tablet Take 10 mg by mouth daily after lunch.      chlorhexidine (PERIDEX) 0.12 % solution 15 mLs by Mouth Rinse route in the morning and at bedtime.     Cholecalciferol 1.25 MG (50000 UT) capsule Take 50,000 Units by mouth daily.     clonazePAM (KLONOPIN) 1 MG tablet Take 2 mg by mouth at bedtime.      desvenlafaxine (PRISTIQ) 50 MG 24 hr tablet Take 50 mg by mouth daily. (Patient not taking: Reported on 09/16/2021)     diphenhydramine-acetaminophen (TYLENOL PM) 25-500 MG TABS  tablet Take 1 tablet by mouth at bedtime.      fluconazole (DIFLUCAN) 150 MG tablet Take 1 tab PO q72h PRN  yeast infection (Patient not taking: Reported on 09/16/2021) 2 tablet 0   fluticasone (FLONASE) 50 MCG/ACT nasal spray Place into the nose.     gabapentin (NEURONTIN) 300 MG capsule Take 300 mg by mouth 2 (two) times daily. 2 TABS IN AM AND 2 TABS IN PM  0   levothyroxine (SYNTHROID, LEVOTHROID) 100 MCG tablet Take by mouth.     Lifitegrast 5 % SOLN Xiidra 5 % eye drops in a dropperette  PLACE 1 DROP IN OU BID     lubiprostone (AMITIZA) 24 MCG capsule Take 24 mcg by mouth 2 (two) times daily with a meal.      methocarbamol (ROBAXIN) 500 MG tablet Take 500 mg by mouth. (Patient not taking: Reported on 09/16/2021)     methotrexate (RHEUMATREX) 2.5 MG tablet Take by mouth.     nystatin (MYCOSTATIN/NYSTOP) 100000 UNIT/GM POWD Apply to the affected areas 2 to 3 times daily until healing is complete (Patient not taking: Reported on 09/16/2021) 1 Bottle 0   nystatin (MYCOSTATIN/NYSTOP) powder Nystop 100,000 unit/gram topical powder  APPLY TO AFFECTED AREA 3 TIMES DAILY     oxyCODONE (ROXICODONE) 5 MG immediate release tablet Take 1 tablet (5 mg total) by mouth every 8 (eight) hours as needed. 20 tablet 0   pantoprazole (PROTONIX) 40 MG tablet Take 1 tablet by mouth daily.     phenazopyridine (PYRIDIUM) 200 MG tablet Take 1 tablet (200 mg total) by mouth 3 (three) times daily. 6 tablet 0   pravastatin (PRAVACHOL) 20 MG tablet Take 20 mg by mouth daily.     pregabalin (LYRICA) 75 MG capsule Take by mouth. (Patient not taking: Reported on 09/16/2021)     rOPINIRole (REQUIP) 0.25 MG tablet Take by mouth. (Patient not taking: Reported on 09/16/2021)     sertraline (ZOLOFT) 50 MG tablet Take by mouth.     tamsulosin (FLOMAX) 0.4 MG CAPS capsule Take 0.4 mg by mouth daily. (Patient not taking: Reported on 09/16/2021)     tiZANidine (ZANAFLEX) 4 MG capsule Take 4 mg by mouth 3 (three) times daily.       venlafaxine XR (EFFEXOR-XR) 150 MG 24 hr capsule venlafaxine hcl er 150 mg cp24       Discharge Medications: Please see discharge summary for a list of discharge medications.  Relevant Imaging Results:  Relevant Lab Results:   Additional Information SS: [id number]  Allayne Butcher, RN

## 2021-09-19 NOTE — Progress Notes (Signed)
Physical Therapy Treatment Patient Details Name: Ariel Alvarez MRN: [id number] DOB: 01-Jan-1946 Today's Date: 09/19/2021   History of Present Illness 75 year old female admitted for chest pain. History includes CVA with chronic L side hemiparesis, anxiety, and falls.    PT Comments    Patient sleeping on arrival to the room. She is lethargic initially but wakes up for participation with mobility with encouragement. She continues to require assistance with all mobility. Transfers completed today with hemi walker (patient uses this at baseline). She needed +2 person assistance to safely get to the bed to chair. She is unable to ambulate due to generalized weakness on top of her chronic left side weakness. Patient appears to have minimal insight into her physical limitations and states she is able to ambulate despite needing significant assistance just for standing. Recommend to continue PT to maximize independence and facilitate return to prior level of function.    Recommendations for follow up therapy are one component of a multi-disciplinary discharge planning process, led by the attending physician.  Recommendations may be updated based on patient status, additional functional criteria and insurance authorization.  Follow Up Recommendations  Skilled nursing-short term rehab (<3 hours/day)     Assistance Recommended at Discharge Frequent or constant Supervision/Assistance  Equipment Recommendations    To be determined at next level of care    Recommendations for Other Services       Precautions / Restrictions Precautions Precautions: Fall Restrictions Weight Bearing Restrictions: No     Mobility  Bed Mobility Overal bed mobility: Needs Assistance Bed Mobility: Supine to Sit     Supine to sit: Max assist;+2 for physical assistance     General bed mobility comments: assistance for trunk and BLE support. verbal cues for task initiation.    Transfers Overall transfer  level: Needs assistance   Transfers: Bed to chair/wheelchair/BSC;Sit to/from Stand Sit to Stand: Max assist using hemi walker RUE      Step pivot transfers: Mod assist;+2 physical assistance     General transfer comment: patient is able to take 1-2 small steps with hemi walker to complete step transfer from stretcher to chair with +2 assistance. verbal cues for technique, foot placement, positioning of hemiwalker. Max A of one person for standing from the recliner    Ambulation/Gait         Gait velocity: unable to safely progress ambulation (even with 2 person assistance) at this time due to poor standing tolerance and generalized weakness (on top of chronic left hemiparesis)         Stairs             Wheelchair Mobility    Modified Rankin (Stroke Patients Only)       Balance Overall balance assessment: History of Falls;Needs assistance Sitting-balance support: Feet supported Sitting balance-Leahy Scale: Fair Sitting balance - Comments: no loss of balance with feet supported on the floor in recliner chair. close stand by assistance provided for safety Postural control: Posterior lean Standing balance support: Single extremity supported;During functional activity Standing balance-Leahy Scale: Zero Standing balance comment: Max A required to maintain standing balance with faciliation for anterior weight shifting                            Cognition Arousal/Alertness: Lethargic (initially lethargic, sleeping on arrival to room. increased alertness with mobility efforts) Behavior During Therapy: WFL for tasks assessed/performed Overall Cognitive Status: No family/caregiver present to determine baseline cognitive  functioning                                 General Comments: patient with decreased awareness of physical limitations and need for assistance with mobility. she over estimates her abilities. able to follow single step commands  with exta time and repetition        Exercises      General Comments General comments (skin integrity, edema, etc.): LUE positioned on pillow for comfort in recliner chair with cues to maintain awareness of arm positioning to prevent injury due to old hemiparesis. patient set-up with breatkfast at end of session. again, patient would benefit from a hospital bed for comfort and facilitate independence with transfers in and out of bed with staff assistance. she currently still has a stretcher bed in the room.      Pertinent Vitals/Pain Pain Assessment: No/denies pain    Home Living                          Prior Function            PT Goals (current goals can now be found in the care plan section) Acute Rehab PT Goals Patient Stated Goal: to go to rehab PT Goal Formulation: With patient Time For Goal Achievement: 09/30/21 Potential to Achieve Goals: Good Progress towards PT goals: Progressing toward goals    Frequency    Min 2X/week      PT Plan Current plan remains appropriate    Co-evaluation PT/OT/SLP Co-Evaluation/Treatment: Yes Reason for Co-Treatment: To address functional/ADL transfers PT goals addressed during session: Mobility/safety with mobility OT goals addressed during session: ADL's and self-care      AM-PAC PT "6 Clicks" Mobility   Outcome Measure  Help needed turning from your back to your side while in a flat bed without using bedrails?: A Lot Help needed moving from lying on your back to sitting on the side of a flat bed without using bedrails?: Total Help needed moving to and from a bed to a chair (including a wheelchair)?: Total Help needed standing up from a chair using your arms (e.g., wheelchair or bedside chair)?: Total Help needed to walk in hospital room?: Total Help needed climbing 3-5 steps with a railing? : Total 6 Click Score: 7    End of Session Equipment Utilized During Treatment: Gait belt Activity Tolerance: Patient  tolerated treatment well Patient left: in chair;with call bell/phone within reach;with chair alarm set Nurse Communication: Mobility status (via secure chat) PT Visit Diagnosis: Repeated falls (R29.6);Muscle weakness (generalized) (M62.81);History of falling (Z91.81);Difficulty in walking, not elsewhere classified (R26.2);Hemiplegia and hemiparesis Hemiplegia - Right/Left: Left Hemiplegia - dominant/non-dominant: Non-dominant Hemiplegia - caused by: Cerebral infarction     Time: 5003-7048 PT Time Calculation (min) (ACUTE ONLY): 30 min  Charges:  $Therapeutic Activity: 8-22 mins                     Donna Bernard, PT, MPT   Ina Homes 09/19/2021, 9:59 AM

## 2021-09-19 NOTE — Progress Notes (Signed)
Occupational Therapy Treatment Patient Details Name: Ariel Alvarez MRN: [id number] DOB: 1946-07-10 Today's Date: 09/19/2021   History of present illness 75 year old female admitted for chest pain. History includes CVA with chronic L side hemiparesis, anxiety, and falls.   OT comments  Pt seen for co-tx with PT to attempts ADL transfers/mobility. Pt initially lethargic but alertness improved with mobility efforts. Pt required heavy +2 assist for bed mobility and transfer attempts with hemiwalker and VC for foot placement, hemiwalker mgt. Only able to perform step pivot to recliner with +2 assist. Pt appears to demonstrate impaired insight into deficits resulting in decreased safety awareness. Continue to recommend SNF at discharge.   Recommendations for follow up therapy are one component of a multi-disciplinary discharge planning process, led by the attending physician.  Recommendations may be updated based on patient status, additional functional criteria and insurance authorization.    Follow Up Recommendations  Skilled nursing-short term rehab (<3 hours/day)    Assistance Recommended at Discharge Frequent or constant Supervision/Assistance  Equipment Recommendations  Other (comment) (defer to next venue)    Recommendations for Other Services      Precautions / Restrictions Precautions Precautions: Fall Restrictions Weight Bearing Restrictions: No       Mobility Bed Mobility Overal bed mobility: Needs Assistance Bed Mobility: Supine to Sit     Supine to sit: Max assist;+2 for physical assistance     General bed mobility comments: assistance for trunk and BLE support. verbal cues for task initiation.    Transfers Overall transfer level: Needs assistance Equipment used: Hemi-walker Transfers: Bed to chair/wheelchair/BSC;Sit to/from Stand Sit to Stand: Max assist   Step pivot transfers: Mod assist;+2 physical assistance;From elevated surface       General  transfer comment: patient is able to take 1-2 small steps with hemi walker to complete step transfer from stretcher to chair with +2 assistance. verbal cues for technique, foot placement, positioning of hemiwalker. Tendency to place hemiwalker too far in front of her     Balance Overall balance assessment: History of Falls;Needs assistance Sitting-balance support: Feet supported Sitting balance-Leahy Scale: Fair Sitting balance - Comments: no loss of balance with feet supported on the floor in recliner chair. close stand by assistance provided for safety Postural control: Posterior lean Standing balance support: Single extremity supported;During functional activity Standing balance-Leahy Scale: Zero Standing balance comment: Max A required to maintain standing balance with faciliation for anterior weight shifting                           ADL either performed or assessed with clinical judgement   ADL       Grooming: Sitting;Wash/dry hands;Wash/dry face;Set up               Lower Body Dressing: Maximal assistance;Sitting/lateral leans Lower Body Dressing Details (indicate cue type and reason): Max A to doff socks and don shoes                    Extremity/Trunk Assessment              Vision       Perception     Praxis      Cognition Arousal/Alertness: Lethargic (initially lethargic, sleeping on arrival to room. increased alertness with mobility efforts) Behavior During Therapy: WFL for tasks assessed/performed Overall Cognitive Status: No family/caregiver present to determine baseline cognitive functioning  General Comments: patient with decreased awareness of physical limitations and need for assistance with mobility. she over estimates her abilities. able to follow single step commands with exta time and repetition          Exercises     Shoulder Instructions       General Comments LUE  positioned on pillow for comfort in recliner chair with cues to maintain awareness of arm positioning to prevent injury due to old hemiparesis. patient set-up with breatkfast at end of session. again, patient would benefit from a hospital bed for comfort and facilitate independence with transfers in and out of bed with staff assistance. she currently still has a stretcher bed in the room.    Pertinent Vitals/ Pain       Pain Assessment: No/denies pain  Home Living                                          Prior Functioning/Environment              Frequency  Min 2X/week        Progress Toward Goals  OT Goals(current goals can now be found in the care plan section)  Progress towards OT goals: Progressing toward goals  Acute Rehab OT Goals Patient Stated Goal: to go to rehab OT Goal Formulation: With patient Time For Goal Achievement: 09/30/21 Potential to Achieve Goals: Good  Plan Discharge plan remains appropriate;Frequency remains appropriate    Co-evaluation    PT/OT/SLP Co-Evaluation/Treatment: Yes Reason for Co-Treatment: For patient/therapist safety;To address functional/ADL transfers PT goals addressed during session: Mobility/safety with mobility OT goals addressed during session: ADL's and self-care      AM-PAC OT "6 Clicks" Daily Activity     Outcome Measure   Help from another person eating meals?: A Little Help from another person taking care of personal grooming?: A Little Help from another person toileting, which includes using toliet, bedpan, or urinal?: Total Help from another person bathing (including washing, rinsing, drying)?: A Lot Help from another person to put on and taking off regular upper body clothing?: A Lot Help from another person to put on and taking off regular lower body clothing?: A Lot 6 Click Score: 13    End of Session Equipment Utilized During Treatment: Gait belt;Other (comment) (hemiwalker)  OT Visit  Diagnosis: Unsteadiness on feet (R26.81);Muscle weakness (generalized) (M62.81);History of falling (Z91.81)   Activity Tolerance Patient tolerated treatment well   Patient Left in chair;with call bell/phone within reach   Nurse Communication          Time: 3888-2800 OT Time Calculation (min): 30 min  Charges: OT General Charges $OT Visit: 1 Visit OT Treatments $Self Care/Home Management : 8-22 mins  Arman Filter., MPH, MS, OTR/L ascom [phone] 09/19/21, 1:02 PM

## 2021-09-19 NOTE — ED Notes (Signed)
Pt c/o pelvic ache 8/10. Pt repositioned in recliner.

## 2021-09-19 NOTE — ED Notes (Signed)
Pt in bed, pt denies pain at this time, repositioned pt.  Pt reading a book and drinking a pepsi

## 2021-09-19 NOTE — ED Notes (Signed)
Pt helped back into bed, changed pure wick and pt's gown, cleaned pt,

## 2021-09-20 NOTE — ED Notes (Signed)
Pt changed with clean brief and sheets

## 2021-09-20 NOTE — ED Provider Notes (Signed)
Today's Vitals   09/19/21 1816 09/19/21 2305 09/19/21 2345 09/20/21 0245  BP:  136/78    Pulse:  90 88 85  Resp:  20    Temp:  98 F (36.7 C)    TempSrc:  Oral    SpO2:  92% 93% 98%  Weight:      Height:      PainSc: 0-No pain 4      Body mass index is 47.49 kg/m.   Patient resting comfortably without acute medical complaints.  Awaiting social work disposition.   Jaxon Flatt, Layla Maw, DO 09/20/21 [phone]

## 2021-09-20 NOTE — ED Notes (Signed)
EMS  CALLED  FOR  TRANSPORT  TO  [facility]

## 2021-10-03 ENCOUNTER — Ambulatory Visit (INDEPENDENT_AMBULATORY_CARE_PROVIDER_SITE_OTHER): Payer: Medicare PPO | Admitting: Psychiatry

## 2021-10-03 ENCOUNTER — Other Ambulatory Visit: Payer: Self-pay

## 2021-10-03 ENCOUNTER — Encounter: Payer: Self-pay | Admitting: Psychiatry

## 2021-10-03 VITALS — BP 149/79 | HR 74 | Temp 97.1°F

## 2021-10-03 DIAGNOSIS — F29 Unspecified psychosis not due to a substance or known physiological condition: Secondary | ICD-10-CM

## 2021-10-03 DIAGNOSIS — F09 Unspecified mental disorder due to known physiological condition: Secondary | ICD-10-CM

## 2021-10-03 DIAGNOSIS — F39 Unspecified mood [affective] disorder: Secondary | ICD-10-CM

## 2021-10-03 DIAGNOSIS — F331 Major depressive disorder, recurrent, moderate: Secondary | ICD-10-CM | POA: Diagnosis not present

## 2021-10-03 MED ORDER — QUETIAPINE FUMARATE 50 MG PO TABS: 75.0000 mg | ORAL_TABLET | Freq: Every day | ORAL | 1 refills | Status: AC

## 2021-10-03 NOTE — Patient Instructions (Signed)
The only medication change which was made today-Increase Seroquel to 75 mg at bedtime.  All her other medications remains the same-no changes made
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
## 2021-10-31 ENCOUNTER — Ambulatory Visit: Payer: Medicare PPO | Admitting: Psychiatry

## 2021-11-10 ENCOUNTER — Other Ambulatory Visit: Payer: Self-pay | Admitting: Internal Medicine

## 2021-11-10 DIAGNOSIS — I639 Cerebral infarction, unspecified: Secondary | ICD-10-CM

## 2021-11-10 DIAGNOSIS — M7989 Other specified soft tissue disorders: Secondary | ICD-10-CM

## 2021-11-15 ENCOUNTER — Other Ambulatory Visit: Payer: Self-pay | Admitting: Internal Medicine

## 2021-11-15 DIAGNOSIS — R1319 Other dysphagia: Secondary | ICD-10-CM

## 2021-11-15 DIAGNOSIS — I639 Cerebral infarction, unspecified: Secondary | ICD-10-CM

## 2021-11-23 ENCOUNTER — Other Ambulatory Visit: Payer: Medicare PPO

## 2021-11-29 ENCOUNTER — Ambulatory Visit: Payer: Medicare PPO

## 2021-12-12 ENCOUNTER — Other Ambulatory Visit: Payer: Medicare PPO

## 2021-12-12 ENCOUNTER — Inpatient Hospital Stay: Admission: RE | Admit: 2021-12-12 | Payer: Medicare PPO | Source: Ambulatory Visit

## 2021-12-12 ENCOUNTER — Ambulatory Visit: Admission: RE | Admit: 2021-12-12 | Payer: Medicare PPO | Source: Ambulatory Visit

## 2021-12-29 ENCOUNTER — Other Ambulatory Visit: Payer: Self-pay

## 2021-12-29 ENCOUNTER — Ambulatory Visit
Admission: RE | Admit: 2021-12-29 | Discharge: 2021-12-29 | Disposition: A | Discharge status: Home / Self Care | Payer: Medicare PPO | Source: Ambulatory Visit | Attending: Internal Medicine | Admitting: Internal Medicine

## 2021-12-29 DIAGNOSIS — M7989 Other specified soft tissue disorders: Secondary | ICD-10-CM | POA: Insufficient documentation

## 2021-12-29 DIAGNOSIS — R1319 Other dysphagia: Secondary | ICD-10-CM | POA: Insufficient documentation

## 2021-12-29 DIAGNOSIS — I639 Cerebral infarction, unspecified: Secondary | ICD-10-CM | POA: Diagnosis present

## 2022-01-29 DIAGNOSIS — Z5321 Procedure and treatment not carried out due to patient leaving prior to being seen by health care provider: Secondary | ICD-10-CM | POA: Diagnosis not present

## 2022-01-29 DIAGNOSIS — J9811 Atelectasis: Secondary | ICD-10-CM | POA: Diagnosis not present

## 2022-01-29 DIAGNOSIS — Z20822 Contact with and (suspected) exposure to covid-19: Secondary | ICD-10-CM | POA: Diagnosis not present

## 2022-01-29 DIAGNOSIS — R059 Cough, unspecified: Secondary | ICD-10-CM | POA: Diagnosis not present

## 2022-01-31 ENCOUNTER — Emergency Department: Payer: Medicare HMO

## 2022-01-31 ENCOUNTER — Inpatient Hospital Stay
Admission: EM | Admit: 2022-01-31 | Discharge: 2022-02-06 | DRG: 193 | Disposition: A | Discharge status: Skilled Nursing Facility | Payer: Medicare HMO | Attending: Internal Medicine | Admitting: Internal Medicine

## 2022-01-31 ENCOUNTER — Other Ambulatory Visit: Payer: Self-pay

## 2022-01-31 DIAGNOSIS — Z7901 Long term (current) use of anticoagulants: Secondary | ICD-10-CM

## 2022-01-31 DIAGNOSIS — R5381 Other malaise: Secondary | ICD-10-CM | POA: Diagnosis not present

## 2022-01-31 DIAGNOSIS — Z79890 Hormone replacement therapy (postmenopausal): Secondary | ICD-10-CM | POA: Diagnosis not present

## 2022-01-31 DIAGNOSIS — E119 Type 2 diabetes mellitus without complications: Secondary | ICD-10-CM

## 2022-01-31 DIAGNOSIS — Z833 Family history of diabetes mellitus: Secondary | ICD-10-CM

## 2022-01-31 DIAGNOSIS — Z6841 Body Mass Index (BMI) 40.0 and over, adult: Secondary | ICD-10-CM | POA: Diagnosis not present

## 2022-01-31 DIAGNOSIS — Z823 Family history of stroke: Secondary | ICD-10-CM

## 2022-01-31 DIAGNOSIS — E039 Hypothyroidism, unspecified: Secondary | ICD-10-CM | POA: Diagnosis not present

## 2022-01-31 DIAGNOSIS — J168 Pneumonia due to other specified infectious organisms: Secondary | ICD-10-CM | POA: Diagnosis not present

## 2022-01-31 DIAGNOSIS — E1142 Type 2 diabetes mellitus with diabetic polyneuropathy: Secondary | ICD-10-CM | POA: Diagnosis not present

## 2022-01-31 DIAGNOSIS — M359 Systemic involvement of connective tissue, unspecified: Secondary | ICD-10-CM | POA: Diagnosis present

## 2022-01-31 DIAGNOSIS — Z79899 Other long term (current) drug therapy: Secondary | ICD-10-CM

## 2022-01-31 DIAGNOSIS — F32A Depression, unspecified: Secondary | ICD-10-CM | POA: Diagnosis present

## 2022-01-31 DIAGNOSIS — I69334 Monoplegia of upper limb following cerebral infarction affecting left non-dominant side: Secondary | ICD-10-CM | POA: Diagnosis not present

## 2022-01-31 DIAGNOSIS — Z20822 Contact with and (suspected) exposure to covid-19: Secondary | ICD-10-CM | POA: Diagnosis not present

## 2022-01-31 DIAGNOSIS — E876 Hypokalemia: Secondary | ICD-10-CM | POA: Diagnosis not present

## 2022-01-31 DIAGNOSIS — R062 Wheezing: Secondary | ICD-10-CM | POA: Diagnosis not present

## 2022-01-31 DIAGNOSIS — Z87891 Personal history of nicotine dependence: Secondary | ICD-10-CM

## 2022-01-31 DIAGNOSIS — E1169 Type 2 diabetes mellitus with other specified complication: Secondary | ICD-10-CM

## 2022-01-31 DIAGNOSIS — E872 Acidosis, unspecified: Secondary | ICD-10-CM | POA: Diagnosis present

## 2022-01-31 DIAGNOSIS — R0902 Hypoxemia: Secondary | ICD-10-CM | POA: Diagnosis not present

## 2022-01-31 DIAGNOSIS — Z811 Family history of alcohol abuse and dependence: Secondary | ICD-10-CM | POA: Diagnosis not present

## 2022-01-31 DIAGNOSIS — I480 Paroxysmal atrial fibrillation: Secondary | ICD-10-CM | POA: Diagnosis present

## 2022-01-31 DIAGNOSIS — Z818 Family history of other mental and behavioral disorders: Secondary | ICD-10-CM

## 2022-01-31 DIAGNOSIS — Z7984 Long term (current) use of oral hypoglycemic drugs: Secondary | ICD-10-CM

## 2022-01-31 DIAGNOSIS — M6281 Muscle weakness (generalized): Secondary | ICD-10-CM | POA: Diagnosis not present

## 2022-01-31 DIAGNOSIS — R0602 Shortness of breath: Secondary | ICD-10-CM | POA: Diagnosis not present

## 2022-01-31 DIAGNOSIS — Z809 Family history of malignant neoplasm, unspecified: Secondary | ICD-10-CM

## 2022-01-31 DIAGNOSIS — Z743 Need for continuous supervision: Secondary | ICD-10-CM | POA: Diagnosis not present

## 2022-01-31 DIAGNOSIS — R29898 Other symptoms and signs involving the musculoskeletal system: Secondary | ICD-10-CM | POA: Diagnosis present

## 2022-01-31 DIAGNOSIS — Z7982 Long term (current) use of aspirin: Secondary | ICD-10-CM | POA: Diagnosis not present

## 2022-01-31 DIAGNOSIS — R531 Weakness: Secondary | ICD-10-CM | POA: Diagnosis not present

## 2022-01-31 DIAGNOSIS — J9601 Acute respiratory failure with hypoxia: Secondary | ICD-10-CM | POA: Diagnosis not present

## 2022-01-31 DIAGNOSIS — I959 Hypotension, unspecified: Secondary | ICD-10-CM | POA: Diagnosis not present

## 2022-01-31 DIAGNOSIS — R278 Other lack of coordination: Secondary | ICD-10-CM | POA: Diagnosis not present

## 2022-01-31 DIAGNOSIS — K219 Gastro-esophageal reflux disease without esophagitis: Secondary | ICD-10-CM | POA: Diagnosis present

## 2022-01-31 DIAGNOSIS — R2689 Other abnormalities of gait and mobility: Secondary | ICD-10-CM | POA: Diagnosis not present

## 2022-01-31 DIAGNOSIS — J189 Pneumonia, unspecified organism: Secondary | ICD-10-CM | POA: Diagnosis not present

## 2022-01-31 DIAGNOSIS — I2699 Other pulmonary embolism without acute cor pulmonale: Secondary | ICD-10-CM | POA: Diagnosis present

## 2022-01-31 DIAGNOSIS — Z86711 Personal history of pulmonary embolism: Secondary | ICD-10-CM

## 2022-01-31 LAB — COMPREHENSIVE METABOLIC PANEL
ALT: 18 U/L (ref 0–44)
AST: 37 U/L (ref 15–41)
Albumin: 3.5 g/dL (ref 3.5–5.0)
Alkaline Phosphatase: 68 U/L (ref 38–126)
Anion gap: 10 (ref 5–15)
BUN: 9 mg/dL (ref 8–23)
CO2: 31 mmol/L (ref 22–32)
Calcium: 8.5 mg/dL — ABNORMAL LOW (ref 8.9–10.3)
Chloride: 100 mmol/L (ref 98–111)
Creatinine, Ser: 0.88 mg/dL (ref 0.44–1.00)
GFR, Estimated: >60 mL/min (ref >60)
Glucose, Bld: 129 mg/dL — ABNORMAL HIGH (ref 70–99)
Potassium: 3.9 mmol/L (ref 3.5–5.1)
Sodium: 141 mmol/L (ref 135–145)
Total Bilirubin: 1.3 mg/dL — ABNORMAL HIGH (ref 0.3–1.2)
Total Protein: 6.8 g/dL (ref 6.5–8.1)

## 2022-01-31 LAB — CBC WITH DIFFERENTIAL/PLATELET
Abs Immature Granulocytes: 0.04 10*3/uL (ref 0.00–0.07)
Basophils Absolute: 0.1 10*3/uL (ref 0.0–0.1)
Basophils Relative: 1 %
Eosinophils Absolute: 0.3 10*3/uL (ref 0.0–0.5)
Eosinophils Relative: 5 %
HCT: 42.8 % (ref 36.0–46.0)
Hemoglobin: 13.1 g/dL (ref 12.0–15.0)
Immature Granulocytes: 1 %
Lymphocytes Relative: 26 %
Lymphs Abs: 1.6 10*3/uL (ref 0.7–4.0)
MCH: 29.7 pg (ref 26.0–34.0)
MCHC: 30.6 g/dL (ref 30.0–36.0)
MCV: 97.1 fL (ref 80.0–100.0)
Monocytes Absolute: 0.9 10*3/uL (ref 0.1–1.0)
Monocytes Relative: 14 %
Neutro Abs: 3.4 10*3/uL (ref 1.7–7.7)
Neutrophils Relative %: 53 %
Platelets: 152 10*3/uL (ref 150–400)
RBC: 4.41 MIL/uL (ref 3.87–5.11)
RDW: 17 % — ABNORMAL HIGH (ref 11.5–15.5)
Smear Review: NORMAL
WBC: 6.2 10*3/uL (ref 4.0–10.5)
nRBC: 0 % (ref 0.0–0.2)

## 2022-01-31 LAB — PROCALCITONIN: Procalcitonin: <0.1 ng/mL

## 2022-01-31 LAB — LACTIC ACID, PLASMA
Lactic Acid, Venous: 2 mmol/L (ref 0.5–1.9)
Lactic Acid, Venous: 2.8 mmol/L (ref 0.5–1.9)

## 2022-01-31 LAB — PROTIME-INR
INR: 1 (ref 0.8–1.2)
Prothrombin Time: 13.2 seconds (ref 11.4–15.2)

## 2022-01-31 LAB — APTT: aPTT: 26 seconds (ref 24–36)

## 2022-01-31 LAB — GLUCOSE, CAPILLARY: Glucose-Capillary: 243 mg/dL — ABNORMAL HIGH (ref 70–99)

## 2022-01-31 LAB — BRAIN NATRIURETIC PEPTIDE: B Natriuretic Peptide: 15.5 pg/mL (ref 0.0–100.0)

## 2022-01-31 LAB — RESP PANEL BY RT-PCR (FLU A&B, COVID) ARPGX2
Influenza A by PCR: NEGATIVE
Influenza B by PCR: NEGATIVE
SARS Coronavirus 2 by RT PCR: NEGATIVE

## 2022-01-31 LAB — TROPONIN I (HIGH SENSITIVITY)
Troponin I (High Sensitivity): 4 ng/L (ref <18)
Troponin I (High Sensitivity): 4 ng/L (ref <18)

## 2022-01-31 LAB — MAGNESIUM: Magnesium: 2.1 mg/dL (ref 1.7–2.4)

## 2022-01-31 MED ORDER — APIXABAN 5 MG PO TABS
5.0000 mg | ORAL_TABLET | Freq: Two times a day (BID) | ORAL | Status: DC
  Administered 2022-01-31 – 2022-02-06 (×12): 5 mg via ORAL
  Filled 2022-01-31 (×12): qty 1

## 2022-01-31 MED ORDER — VITAMIN D (ERGOCALCIFEROL) 1.25 MG (50000 UNIT) PO CAPS
50000.0000 [IU] | ORAL_CAPSULE | Freq: Every day | ORAL | Status: DC
  Administered 2022-02-04 – 2022-02-06 (×3): 50000 [IU] via ORAL
  Filled 2022-01-31 (×3): qty 1

## 2022-01-31 MED ORDER — ASPIRIN EC 81 MG PO TBEC
81.0000 mg | DELAYED_RELEASE_TABLET | Freq: Every day | ORAL | Status: DC
  Administered 2022-01-31 – 2022-02-06 (×7): 81 mg via ORAL
  Filled 2022-01-31 (×7): qty 1

## 2022-01-31 MED ORDER — IPRATROPIUM-ALBUTEROL 0.5-2.5 (3) MG/3ML IN SOLN
9.0000 mL | Freq: Once | RESPIRATORY_TRACT | Status: AC
  Administered 2022-01-31: 9 mL via RESPIRATORY_TRACT
  Filled 2022-01-31: qty 9

## 2022-01-31 MED ORDER — SODIUM CHLORIDE 0.9 % IV SOLN
500.0000 mg | INTRAVENOUS | Status: AC
  Administered 2022-02-01 – 2022-02-05 (×5): 500 mg via INTRAVENOUS
  Filled 2022-01-31 (×2): qty 5
  Filled 2022-01-31: qty 500
  Filled 2022-01-31: qty 5
  Filled 2022-01-31: qty 500

## 2022-01-31 MED ORDER — TAMSULOSIN HCL 0.4 MG PO CAPS: 0.4000 mg | ORAL_CAPSULE | Freq: Every day | ORAL | Status: DC

## 2022-01-31 MED ORDER — PANTOPRAZOLE SODIUM 40 MG PO TBEC
40.0000 mg | DELAYED_RELEASE_TABLET | Freq: Every day | ORAL | Status: DC
  Administered 2022-01-31 – 2022-02-06 (×7): 40 mg via ORAL
  Filled 2022-01-31 (×7): qty 1

## 2022-01-31 MED ORDER — LORATADINE 10 MG PO TABS
10.0000 mg | ORAL_TABLET | Freq: Every day | ORAL | Status: DC
Start: 2022-01-31 — End: 2022-02-07
  Administered 2022-01-31 – 2022-02-06 (×7): 10 mg via ORAL
  Filled 2022-01-31 (×7): qty 1

## 2022-01-31 MED ORDER — SODIUM CHLORIDE 0.9 % IV SOLN
1.0000 g | Freq: Once | INTRAVENOUS | Status: AC
  Administered 2022-01-31: 1 g via INTRAVENOUS
  Filled 2022-01-31: qty 10

## 2022-01-31 MED ORDER — LACTATED RINGERS IV BOLUS
1000.0000 mL | Freq: Once | INTRAVENOUS | Status: AC
Start: 2022-01-31 — End: 2022-01-31
  Administered 2022-01-31: 1000 mL via INTRAVENOUS

## 2022-01-31 MED ORDER — ATORVASTATIN CALCIUM 20 MG PO TABS
40.0000 mg | ORAL_TABLET | ORAL | Status: DC
  Administered 2022-02-01 – 2022-02-06 (×6): 40 mg via ORAL
  Filled 2022-01-31 (×6): qty 2

## 2022-01-31 MED ORDER — SODIUM CHLORIDE 0.9 % IV SOLN: INTRAVENOUS | Status: AC

## 2022-01-31 MED ORDER — ACETAMINOPHEN 325 MG PO TABS
325.0000 mg | ORAL_TABLET | Freq: Four times a day (QID) | ORAL | Status: DC | PRN
  Administered 2022-01-31 – 2022-02-06 (×7): 325 mg via ORAL
  Filled 2022-01-31 (×7): qty 1

## 2022-01-31 MED ORDER — HYDROXYCHLOROQUINE SULFATE 200 MG PO TABS
400.0000 mg | ORAL_TABLET | Freq: Every day | ORAL | Status: DC
Start: 2022-01-31 — End: 2022-02-07
  Administered 2022-01-31 – 2022-02-06 (×7): 400 mg via ORAL
  Filled 2022-01-31 (×8): qty 2

## 2022-01-31 MED ORDER — SODIUM CHLORIDE 0.9 % IV SOLN: 500.0000 mg | Freq: Once | INTRAVENOUS | Status: DC

## 2022-01-31 MED ORDER — FOLIC ACID 1 MG PO TABS
1.0000 mg | ORAL_TABLET | Freq: Every day | ORAL | Status: DC
  Administered 2022-01-31 – 2022-02-06 (×7): 1 mg via ORAL
  Filled 2022-01-31 (×7): qty 1

## 2022-01-31 MED ORDER — ONDANSETRON HCL 4 MG/2ML IJ SOLN: 4.0000 mg | Freq: Four times a day (QID) | INTRAMUSCULAR | Status: DC | PRN

## 2022-01-31 MED ORDER — LEVOTHYROXINE SODIUM 100 MCG PO TABS
100.0000 ug | ORAL_TABLET | Freq: Every day | ORAL | Status: DC
  Administered 2022-02-01 – 2022-02-06 (×6): 100 ug via ORAL
  Filled 2022-01-31: qty 1
  Filled 2022-01-31: qty 2
  Filled 2022-01-31 (×2): qty 1
  Filled 2022-01-31 (×2): qty 2

## 2022-01-31 MED ORDER — ONDANSETRON HCL 4 MG PO TABS: 4.0000 mg | ORAL_TABLET | Freq: Four times a day (QID) | ORAL | Status: DC | PRN

## 2022-01-31 MED ORDER — SODIUM CHLORIDE 0.9 % IV SOLN
2.0000 g | INTRAVENOUS | Status: AC
  Administered 2022-02-01 – 2022-02-04 (×4): 2 g via INTRAVENOUS
  Filled 2022-01-31 (×2): qty 20
  Filled 2022-01-31: qty 2
  Filled 2022-01-31: qty 20

## 2022-01-31 MED ORDER — IPRATROPIUM-ALBUTEROL 0.5-2.5 (3) MG/3ML IN SOLN
3.0000 mL | Freq: Four times a day (QID) | RESPIRATORY_TRACT | Status: DC | PRN
Start: 2022-01-31 — End: 2022-02-07
  Administered 2022-02-01 – 2022-02-02 (×2): 3 mL via RESPIRATORY_TRACT
  Filled 2022-01-31 (×4): qty 3

## 2022-01-31 MED ORDER — VENLAFAXINE HCL ER 75 MG PO CP24
75.0000 mg | ORAL_CAPSULE | Freq: Every day | ORAL | Status: DC
  Administered 2022-02-01 – 2022-02-06 (×6): 75 mg via ORAL
  Filled 2022-01-31 (×6): qty 1

## 2022-01-31 MED ORDER — SODIUM CHLORIDE 0.9 % IV SOLN
100.0000 mg | Freq: Once | INTRAVENOUS | Status: AC
  Administered 2022-01-31: 100 mg via INTRAVENOUS
  Filled 2022-01-31: qty 100

## 2022-01-31 MED ORDER — CLONAZEPAM 0.5 MG PO TABS
1.0000 mg | ORAL_TABLET | Freq: Every day | ORAL | Status: DC | PRN
  Administered 2022-02-01 – 2022-02-03 (×2): 1 mg via ORAL
  Filled 2022-01-31: qty 2
  Filled 2022-01-31 (×2): qty 1

## 2022-01-31 MED ORDER — INSULIN ASPART 100 UNIT/ML IJ SOLN
0.0000 [IU] | Freq: Three times a day (TID) | INTRAMUSCULAR | Status: DC
  Administered 2022-01-31: 5 [IU] via SUBCUTANEOUS
  Administered 2022-02-01 (×2): 3 [IU] via SUBCUTANEOUS
  Administered 2022-02-02 – 2022-02-05 (×3): 2 [IU] via SUBCUTANEOUS
  Filled 2022-01-31 (×7): qty 1

## 2022-01-31 MED ORDER — QUETIAPINE FUMARATE 25 MG PO TABS
75.0000 mg | ORAL_TABLET | Freq: Every day | ORAL | Status: DC
  Administered 2022-01-31 – 2022-02-05 (×6): 75 mg via ORAL
  Filled 2022-01-31 (×6): qty 3

## 2022-01-31 NOTE — H&P (Signed)
?History and Physical  ? ? ?Patient: Ariel Alvarez Kimberley NFA:[id number] DOB: August 06, 1946 ?DOA: 01/31/2022 ?DOS: the patient was seen and examined on 01/31/2022 ?PCP: Enid BaasKalisetti, Radhika, MD  ?Patient coming from: Home ? ?Chief Complaint:  ?Chief Complaint  ?Patient presents with  ? Weakness  ? ?HPI: Ariel Alvarez Weins is a 76 y.o. female with medical history significant for morbid obesity (BMI 47.47 kg/m2), diabetes mellitus, history of CVA with left upper extremity weakness, hypothyroidism, diabetes mellitus, depression, GERD who was brought into the ER via EMS for evaluation of multiple symptoms which include 1 week history of weakness and unsteady gait, cough productive of yellow phlegm, subjective fever and diarrhea. ?Per EMS in the field patient had wheezing and was hypoxic requiring oxygen supplementation.  She was placed on 2 L per EMS and upon arrival to the ER pulse oximetry was 87 to 88% and her oxygen was increased to 3 L. ?She states that her husband has similar symptoms and that she has not felt well in over a week.  Her oral intake has been poor as well.  She denies having any nausea or vomiting. ?She complains of abdominal pain mostly periumbilical and rates her pain a 4 x 10 in intensity at its worst.  Pain is associated with diarrhea she denies having any nausea or vomiting.  She has no urinary symptoms, no headache, no blurred vision, no focal deficit, no dizziness or lightheadedness. ?Review of Systems: As mentioned in the history of present illness. All other systems reviewed and are negative. ?Past Medical History:  ?Diagnosis Date  ? Anxiety   ? Arthritis   ? RIGHT HAND  ? Collagen vascular disease (HCC)   ? Complication of anesthesia   ? HARD TO WAKE UP AFTER  ONE SURGERY-PT STAETS SHE WAS GIVEN TOO MUCH ANESTHESIA  ? Depression   ? Dyspnea   ? VERY RARE WITH EXERTION  ? GERD (gastroesophageal reflux disease)   ? History of kidney stones   ? H/O  ? Hypothyroidism   ? Stroke [facility](HCC) [id number]  ? X2-LEFT HAD  PARALYZED   ? ?Past Surgical History:  ?Procedure Laterality Date  ? CHOLECYSTECTOMY    ? KIDNEY STONE SURGERY    ? METATARSAL HEAD EXCISION Bilateral 04/15/2020  ? Procedure: METATARSAL HEAD PARTIAL EXCISION - BILATERAL;  Surgeon: Linus Galasline, Todd, DPM;  Location: [facility] ORS;  Service: Podiatry;  Laterality: Bilateral;  ? ?Social History:  reports that she quit smoking about 47 years ago. Her smoking use included cigarettes. She has a 20.00 pack-year smoking history. She has never used smokeless tobacco. She reports that she does not currently use alcohol. She reports that she does not use drugs. ? ?Allergies  ?Allergen Reactions  ? Latex Hives  ? Prednisone Nausea And Vomiting  ?  Pt states causes N/V even when taken with food.  ? ? ?Family History  ?Problem Relation Age of Onset  ? Alcohol abuse Father   ? Depression Father   ? Suicidality Father   ? Cancer Paternal Aunt   ? Stroke Paternal Grandmother   ? Diabetes Son   ? ? ?Prior to Admission medications   ?Medication Sig Start Date End Date Taking? Authorizing Provider  ?acetaminophen (TYLENOL) 325 MG tablet Take 325 mg by mouth every 6 (six) hours as needed.  12/22/08   [provider]  ?albuterol (VENTOLIN HFA) 108 (90 Base) MCG/ACT inhaler Inhale 1-2 puffs into the lungs every 6 (six) hours as needed.  09/30/18   [provider]  ?  apixaban (ELIQUIS) 5 MG TABS tablet Take 5 mg by mouth 2 (two) times daily.  11/26/19   [provider]  ?aspirin 81 MG EC tablet Take 81 mg by mouth daily.     [provider]  ?atorvastatin (LIPITOR) 40 MG tablet Take 40 mg by mouth every morning.     [provider]  ?cetirizine (ZYRTEC) 10 MG tablet Take 10 mg by mouth daily after lunch.     [provider]  ?chlorhexidine (PERIDEX) 0.12 % solution 15 mLs by Mouth Rinse route in the morning and at bedtime. 08/28/21   [provider]  ?Cholecalciferol 1.25 MG (50000 UT) capsule Take 50,000 Units by mouth daily.    [provider]  ?clonazePAM (KLONOPIN) 1 MG tablet Take 1 tablet by mouth daily as needed. 08/03/21   [provider]  ?clotrimazole (LOTRIMIN) 1 % cream Apply to affected area 2 times daily 05/16/21   Shirlee Latch, PA-C  ?desvenlafaxine (PRISTIQ) 50 MG 24 hr tablet Take 50 mg by mouth daily. 06/15/21   [provider]  ?diphenhydramine-acetaminophen (TYLENOL PM) 25-500 MG TABS tablet Take 1 tablet by mouth at bedtime.     [provider]  ?fluconazole (DIFLUCAN) 150 MG tablet Take 1 tab PO q72h PRN yeast infection 05/16/21   Eusebio Friendly B, PA-C  ?fluticasone (FLONASE) 50 MCG/ACT nasal spray Place into the nose. 12/31/19   [provider]  ?folic acid (FOLVITE) 1 MG tablet Take by mouth. 08/23/20   [provider]  ?gabapentin (NEURONTIN) 300 MG capsule Take 300 mg by mouth 2 (two) times daily. 03/12/17   [provider]  ?hydroxychloroquine (PLAQUENIL) 200 MG tablet Take 400 mg by mouth daily.     [provider]  ?levothyroxine (SYNTHROID) 100 MCG tablet Take 100 mcg by mouth daily before breakfast. 08/05/20   [provider]  ?levothyroxine (SYNTHROID, LEVOTHROID) 100 MCG tablet Take by mouth. 04/19/17 05/16/21  [provider]  ?Lifitegrast 5 % SOLN Xiidra 5 % eye drops in a dropperette ? PLACE 1 DROP IN OU BID    [provider]  ?lubiprostone (AMITIZA) 24 MCG capsule Take 24 mcg by mouth 2 (two) times daily with a meal.     [provider]  ?metFORMIN (GLUCOPHAGE) 500 MG tablet Take 1 tablet by mouth 2 (two) times daily. 02/13/21   [provider]  ?methocarbamol (ROBAXIN) 500 MG tablet Take 500 mg by mouth. 06/27/21   [provider]  ?methotrexate (RHEUMATREX) 2.5 MG tablet Take by mouth. 03/13/21   [provider]  ?nystatin (MYCOSTATIN/NYSTOP) 100000 UNIT/GM POWD Apply to the affected areas 2 to 3 times daily until healing is complete 08/02/15   Renford Dills, NP  ?nystatin  (MYCOSTATIN/NYSTOP) powder Nystop 100,000 unit/gram topical powder ? APPLY TO AFFECTED AREA 3 TIMES DAILY    [provider]  ?omeprazole (PRILOSEC) 20 MG capsule Take 20 mg by mouth every morning.     [provider]  ?QUEtiapine (SEROQUEL) 50 MG tablet Take 1.5 tablets (75 mg total) by mouth at bedtime. Dose increase 10/03/21   Jomarie Longs, MD  ?tamsulosin (FLOMAX) 0.4 MG CAPS capsule Take 0.4 mg by mouth daily. 07/05/21   [provider]  ?eszopiclone (LUNESTA) 2 MG TABS tablet Take 2 mg by mouth at bedtime as needed for sleep. Take immediately before bedtime ?Patient not taking: Reported on 04/14/2020  09/08/20  [provider]  ?phentermine 30 MG capsule Take 30 mg by mouth every  morning.  09/08/20  [provider]  ?warfarin (COUMADIN) 2.5 MG tablet Take 2.5 mg by mouth. ?Patient not taking: Reported on 04/14/2020  09/08/20  [provider]  ? ? ?Physical Exam: ?Vitals:  ? 01/31/22 1146 01/31/22 1147 01/31/22 1159 01/31/22 1300  ?BP:   112/68 (!) 108/58  ?Pulse:   82 81  ?Resp:   19 (!) 39  ?Temp:   (!) 97.5 ?F (36.4 ?C)   ?TempSrc:   Oral   ?SpO2: 99%  94% 90%  ?Weight:  121.6 kg    ?Height:   (1.6 m)    ? ?Physical Exam ?Vitals and nursing note reviewed.  ?Constitutional:   ?   Appearance: She is obese.  ?HENT:  ?   Head: Normocephalic and atraumatic.  ?   Nose: Nose normal.  ?   Comments: Nasal cannula in place ?   Mouth/Throat:  ?   Mouth: Mucous membranes are dry.  ?Eyes:  ?   Conjunctiva/sclera: Conjunctivae normal.  ?Cardiovascular:  ?   Rate and Rhythm: Normal rate and regular rhythm.  ?Pulmonary:  ?   Breath sounds: Wheezing present.  ?   Comments: Scattered expiratory wheezes ?Abdominal:  ?   General: Bowel sounds are normal.  ?   Palpations: Abdomen is soft.  ?   Tenderness: There is abdominal tenderness.  ?   Comments: Periumbilical tenderness.  Central adiposity  ?Musculoskeletal:     ?   General: Normal range of motion.  ?   Cervical back:  Normal range of motion and neck supple.  ?Skin: ?   General: Skin is warm and dry.  ?Neurological:  ?   Mental Status: She is alert and oriented to person, place, and time.  ?   Motor: Weakness present.  ?Psychiatri

## 2022-01-31 NOTE — Assessment & Plan Note (Signed)
Stable ?Continue Effexor, Seroquel and clonazepam ?

## 2022-01-31 NOTE — Assessment & Plan Note (Signed)
Hold metformin during this hospitalization ?Maintain consistent carbohydrate diet ?Glycemic control with sliding scale insulin ?

## 2022-01-31 NOTE — Assessment & Plan Note (Signed)
Patient presents for evaluation of a 1 week history of cough productive of occasional yellow phlegm, subjective fevers and diarrhea. ?Chest x-ray shows a left lower lobe infiltrate ?We will treat patient empirically with Rocephin and Zithromax to complete a 3 to 5-day course of therapy ?

## 2022-01-31 NOTE — Assessment & Plan Note (Signed)
Stable Continue Synthroid 

## 2022-01-31 NOTE — Assessment & Plan Note (Signed)
Complicates overall prognosis and care ?Lifestyle modification and exercise has been discussed with patient in detail ?

## 2022-01-31 NOTE — ED Notes (Signed)
Pillow placed under pt left hip per request ?

## 2022-01-31 NOTE — Assessment & Plan Note (Signed)
Most likely secondary to underlying community-acquired pneumonia ?Patient was hypoxic in the field with wheezing and increased work of breathing and was initially placed on 2 L of oxygen by EMS. ?Upon arrival to the ER she had room air pulse oximetry of 87% and is currently on 3 L of oxygen ?Supportive care with bronchodilator therapy ?Will need to be assessed for home oxygen need prior to discharge ?

## 2022-01-31 NOTE — Assessment & Plan Note (Signed)
Stable ?Continue Plaquenil and folic acid. ?

## 2022-01-31 NOTE — ED Triage Notes (Signed)
Pt BIBA for many things: cough, diarrhea, weakness, leg and body pain, SOB, ABD pain. Pt given albuterol and IV Solumedrol in field for wheezing and decreased SPO2 ?

## 2022-01-31 NOTE — ED Notes (Signed)
Pt sleeping, SPO2 2LPM 87-88%. O2 increased to 3LPM ? ?

## 2022-01-31 NOTE — ED Provider Notes (Addendum)
? ?[facility] ?Provider Note ? ? ? Event Date/Time  ? First MD Initiated Contact with Patient 01/31/22 1143   ?  (approximate) ? ? ?History  ? ?Weakness ? ? ?HPI ? ?Ariel Alvarez is a 76 y.o. 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  Patient denies any earache but states she is little sore throat.  No rash, urinary symptoms, vomiting, hemoptysis or recent falls or injuries.  Per EMS she was hypoxic and wheezing on room air and she was treated with DuoNeb and Solu-Medrol prior to arrival.  Patient states she is remote tobacco abuse history but has not smoked in several decades and does not know of any history of COPD or asthma.  Patient does not normally wear oxygen and was transported on 4 L nasal cannula. ?  ? ?Past Medical History:  ?Diagnosis Date  ? Anxiety   ? Arthritis   ? RIGHT HAND  ? Collagen vascular disease (HCC)   ? Complication of anesthesia   ? HARD TO WAKE UP AFTER  ONE SURGERY-PT STAETS SHE WAS GIVEN TOO MUCH ANESTHESIA  ? Depression   ? Dyspnea   ? VERY RARE WITH EXERTION  ? GERD (gastroesophageal reflux disease)   ? History of kidney stones   ? H/O  ? Hypothyroidism   ? Stroke [facility]) [phone]  ? X2-LEFT HAD PARALYZED   ? ? ?Physical Exam  ?Triage Vital Signs: ?ED Triage Vitals  ?Enc Vitals Group  ?   BP   ?   Pulse   ?   Resp   ?   Temp   ?   Temp src   ?   SpO2   ?   Weight   ?   Height   ?   Head Circumference   ?   Peak Flow   ?   Pain Score   ?   Pain Loc   ?   Pain Edu?   ?   Excl. in GC?   ? ? ?Most recent vital signs: ?Vitals:  ? 01/31/22 1159 01/31/22 1300  ?BP: 112/68 (!) 108/58  ?Pulse: 82 81  ?Resp: 19 (!) 39  ?Temp: (!) 97.5 ?F  (36.4 ?C)   ?SpO2: 94% 90%  ? ? ?General: Awake, no distress.  Slightly uncomfortable appearing. ?CV:  Good peripheral perfusion.  2+ radial pulses. ?Resp:  Normal effort.  Bilateral wheezing and slight rhonchi on the right. ?Abd:  No distention.  Soft. ?Other:  Awake and alert. ? ? ?ED Results / Procedures / Treatments  ?Labs ?(all labs ordered are listed, but only abnormal results are displayed) ?Labs Reviewed  ?LACTIC ACID, PLASMA - Abnormal; Notable for the following components:  ?    Result Value  ? Lactic Acid, Venous 2.0 (*)   ? All other components within normal limits  ?COMPREHENSIVE METABOLIC PANEL - Abnormal; Notable for the following components:  ? Glucose, Bld 129 (*)   ? Calcium 8.5 (*)   ? Total Bilirubin 1.3 (*)   ? All other components within normal limits  ?CBC WITH DIFFERENTIAL/PLATELET - Abnormal; Notable for the following components:  ? RDW 17.0 (*)   ? All other components within normal limits  ?  RESP PANEL BY RT-PCR (FLU A&B, COVID) [id number]  ?CULTURE, BLOOD (ROUTINE X 2)  ?CULTURE, BLOOD (ROUTINE X 2)  ?URINE CULTURE  ?MAGNESIUM  ?LACTIC ACID, PLASMA  ?URINALYSIS, COMPLETE (UACMP) WITH MICROSCOPIC  ?PROTIME-INR  ?APTT  ?PROCALCITONIN  ?BRAIN NATRIURETIC PEPTIDE  ?TROPONIN I (HIGH SENSITIVITY)  ?TROPONIN I (HIGH SENSITIVITY)  ? ? ? ?EKG ? ?ECG is remarkable for sinus rhythm with a ventricular rate of 84, borderline left axis deviation and fair amount of artifact with some nonspecific ST changes in lead III, V3 and lateral leads without other clear evidence of acute ischemia.  QTc interval is 553. ? ? ?RADIOLOGY ?Chest x-ray my interpretation without evidence of pneumothorax, overt edema, large effusion and possible infiltrate in the left base and opacity around the right hilum without other clear focal consolidation.  I reviewed radiology interpretation and agree to findings of some questionable patchy infiltrate or atelectasis at the left base. ? ? ?PROCEDURES: ? ?Critical Care performed: Yes,  see critical care procedure note(s) ? ?.Critical Care ?Performed by: Gilles Chiquito, MD ?Authorized by: Gilles Chiquito, MD  ? ?Critical care provider statement:  ?  Critical care time (minutes):  30 ?  Critical care was necessary to treat or prevent imminent or life-threatening deterioration of the following conditions:  Respiratory failure ?  Critical care was time spent personally by me on the following activities:  Development of treatment plan with patient or surrogate, discussions with consultants, evaluation of patient's response to treatment, examination of patient, ordering and review of laboratory studies, ordering and review of radiographic studies, ordering and performing treatments and interventions, pulse oximetry, re-evaluation of patient's condition and review of old charts ? ? ? ?MEDICATIONS ORDERED IN ED: ?Medications  ?cefTRIAXone (ROCEPHIN) 1 g in sodium chloride 0.9 % 100 mL IVPB (has no administration in time range)  ?azithromycin (ZITHROMAX) 500 mg in sodium chloride 0.9 % 250 mL IVPB (has no administration in time range)  ?lactated ringers bolus 1,000 mL (has no administration in time range)  ?ipratropium-albuterol (DUONEB) 0.5-2.5 (3) MG/3ML nebulizer solution 9 mL (9 mLs Nebulization Given 01/31/22 1213)  ? ? ? ?IMPRESSION / MDM / ASSESSMENT AND PLAN / ED COURSE  ?I reviewed the triage vital signs and the nursing notes. ?             ?               ? ?Differential diagnosis includes, but is not limited to pneumonia, PE, ACS, anemia, metabolic derangements, acute infectious gastroenteritis, and cholecystitis.  Lower suspicion for PE as patient states she is compliant with her Eliquis up until day of onset of symptoms. ? ?ECG is remarkable for sinus rhythm with a ventricular rate of 84, borderline left axis deviation and fair amount of artifact with some nonspecific ST changes in lead III, V3 and lateral leads without other clear evidence of acute ischemia.  QTc interval is 553. ? ?Chest  x-ray my interpretation without evidence of pneumothorax, overt edema, large effusion and possible infiltrate in the left base and opacity around the right hilum without other clear focal consolidation.  I reviewed radiology interpretation and agree to findings of some questionable patchy infiltrate or atelectasis at the left base. ? ?COVID influenza PCR is negative.  Initial lactic acid is 2.  CMP shows no significant electrolyte or metabolic derangements.  CBC without leukocytosis or acute anemia.  Magnesium is within normal limits. ? ?Patient has no objective fever or leukocytosis given she reports cough  and shortness of breath and has wheezing on exam and is immunosuppressed and concern for community-acquired pneumonia.  Her lactic acid is upper limit of normal.  I have a low suspicion for acute heart failure and will give a dose of IV fluids pending repeat lactic as well as antibiotics to cover for community-acquired pneumonia.  Patient states she does not have any history of obstructive airway disease she did have improvement in her wheezing on reassessment although we are unable to further wean her from 3 L nasal cannula several times.  I think she will require admission for further evaluation management regard to this. ? ?  ? ? ?FINAL CLINICAL IMPRESSION(S) / ED DIAGNOSES  ? ?Final diagnoses:  ?Pneumonia due to infectious organism, unspecified laterality, unspecified part of lung  ?Acute respiratory failure with hypoxia (HCC)  ? ? ? ?Rx / DC Orders  ? ?ED Discharge Orders   ? ? None  ? ?  ? ? ? ?Note:  This document was prepared using Dragon voice recognition software and may include unintentional dictation errors. ?  ?Gilles Chiquito, MD ?01/31/22 1356 ? ?  ?Gilles Chiquito, MD ?01/31/22 1359 ? ?

## 2022-01-31 NOTE — ED Notes (Signed)
Pt pillows fluffed, and 2 applied to each leg ?

## 2022-01-31 NOTE — Assessment & Plan Note (Signed)
Continue apixaban 

## 2022-01-31 NOTE — Assessment & Plan Note (Signed)
At baseline patient ambulates with a rolling walker but for the last 1 week her gait has been unsteady due to bilateral lower extremity weakness. ?We will request PT evaluation ?

## 2022-01-31 NOTE — Assessment & Plan Note (Signed)
Most likely secondary to metformin use ?No evidence of sepsis at this time ?

## 2022-02-01 DIAGNOSIS — E872 Acidosis, unspecified: Secondary | ICD-10-CM

## 2022-02-01 DIAGNOSIS — J9601 Acute respiratory failure with hypoxia: Secondary | ICD-10-CM | POA: Diagnosis not present

## 2022-02-01 DIAGNOSIS — J189 Pneumonia, unspecified organism: Secondary | ICD-10-CM | POA: Diagnosis not present

## 2022-02-01 DIAGNOSIS — E876 Hypokalemia: Secondary | ICD-10-CM | POA: Diagnosis not present

## 2022-02-01 LAB — BASIC METABOLIC PANEL
Anion gap: 8 (ref 5–15)
BUN: 10 mg/dL (ref 8–23)
CO2: 29 mmol/L (ref 22–32)
Calcium: 8.2 mg/dL — ABNORMAL LOW (ref 8.9–10.3)
Chloride: 103 mmol/L (ref 98–111)
Creatinine, Ser: 0.73 mg/dL (ref 0.44–1.00)
GFR, Estimated: >60 mL/min (ref >60)
Glucose, Bld: 181 mg/dL — ABNORMAL HIGH (ref 70–99)
Potassium: 3.5 mmol/L (ref 3.5–5.1)
Sodium: 140 mmol/L (ref 135–145)

## 2022-02-01 LAB — GLUCOSE, CAPILLARY
Glucose-Capillary: 168 mg/dL — ABNORMAL HIGH (ref 70–99)
Glucose-Capillary: 160 mg/dL — ABNORMAL HIGH (ref 70–99)
Glucose-Capillary: 165 mg/dL — ABNORMAL HIGH (ref 70–99)
Glucose-Capillary: 89 mg/dL (ref 70–99)
Glucose-Capillary: 119 mg/dL — ABNORMAL HIGH (ref 70–99)

## 2022-02-01 LAB — CBC
HCT: 37.6 % (ref 36.0–46.0)
Hemoglobin: 11.9 g/dL — ABNORMAL LOW (ref 12.0–15.0)
MCH: 30.1 pg (ref 26.0–34.0)
MCHC: 31.6 g/dL (ref 30.0–36.0)
MCV: 95.2 fL (ref 80.0–100.0)
Platelets: 214 10*3/uL (ref 150–400)
RBC: 3.95 MIL/uL (ref 3.87–5.11)
RDW: 16.1 % — ABNORMAL HIGH (ref 11.5–15.5)
WBC: 9.8 10*3/uL (ref 4.0–10.5)
nRBC: 0 % (ref 0.0–0.2)

## 2022-02-01 LAB — LACTIC ACID, PLASMA: Lactic Acid, Venous: 2.7 mmol/L (ref 0.5–1.9)

## 2022-02-01 MED ORDER — CLONAZEPAM 1 MG PO TABS: 1.0000 mg | ORAL_TABLET | Freq: Once | ORAL | Status: DC

## 2022-02-01 MED ORDER — CLONAZEPAM 1 MG PO TABS
1.0000 mg | ORAL_TABLET | ORAL | Status: AC
  Administered 2022-02-01: 1 mg via ORAL

## 2022-02-01 MED ORDER — CLONAZEPAM 1 MG PO TABS: 1.0000 mg | ORAL_TABLET | ORAL | Status: DC

## 2022-02-01 MED ORDER — SODIUM CHLORIDE 0.9 % IV SOLN
INTRAVENOUS | Status: DC
Start: 2022-02-01 — End: 2022-02-02

## 2022-02-01 MED ORDER — GABAPENTIN 600 MG PO TABS
300.0000 mg | ORAL_TABLET | Freq: Two times a day (BID) | ORAL | Status: DC
  Administered 2022-02-01 – 2022-02-06 (×11): 300 mg via ORAL
  Filled 2022-02-01 (×11): qty 1

## 2022-02-01 MED ORDER — ACYCLOVIR 5 % EX OINT
TOPICAL_OINTMENT | Freq: Every day | CUTANEOUS | Status: AC
Start: 2022-02-01 — End: 2022-02-05
  Filled 2022-02-01: qty 15

## 2022-02-01 NOTE — Progress Notes (Signed)
Chaplain Maggie made initial visit with patient who requested prayer. Pt appreciated making a spiritual connection through her faith in Father, Son, and [employer]. Prayer for healing was shared. Conversation uncovered pt's devotional life and tenderness to the things of God. Chaplain affirmed pt's life of faith and encouraged meaning in the moment. Continued support available per on call chaplain. ?

## 2022-02-01 NOTE — Progress Notes (Signed)
VAST consult received to obtain IV access. Pt's previous 2 IV's have been in her right arm which she uses the most. Pt's left arm with little movement, but she stated she has feeling in left arm. IV access obtained in left arm without difficulty. ?

## 2022-02-01 NOTE — Progress Notes (Signed)
?PROGRESS NOTE ? ? ? ?Ariel Alvarez  WJX:[id number] DOB: 12/14/1945 DOA: 01/31/2022 ?PCP: Enid Baas, MD  ? ?Assessment & Plan: ?  ?Principal Problem: ?  CAP (community acquired pneumonia) ?Active Problems: ?  Acute respiratory failure with hypoxia (HCC) ?  Collagen vascular disease (HCC) ?  Other pulmonary embolism without acute cor pulmonale (HCC) ?  Morbid obesity with BMI of 40.0-44.9, adult (HCC) ?  Hypothyroidism ?  Depressive disorder ?  Diabetes (HCC) ?  Weakness of both lower extremities ?  Lactic acidosis ? ? ?CAP: continue on IV rocephin, azithromycin, bronchodilators & encourage incentive spirometry. Continue on supplemental oxygen and wean as tolerated ?  ?Acute hypoxic respiratory failure: likely secondary to underlying community-acquired pneumonia. Upon arrival to the ER she had room air pulse oximetry of 87%. Continue on supplemental oxygen and wean as tolerated  ?  ?Lactic acidosis: repeat lactic level ordered. Started on IVFs ?  ?Weakness of both lower extremities: PT/OT consulted  ?  ?DM2: likely poorly controlled. Continue on SSI w/ accuchecks  ?  ?Depressive disorder: severity unknown. Continue on home dose of effexor, seroquel  ?  ?Hypothyroidism: continue on home dose of synthroid  ?  ?Morbid obesity: BMI 47.4. Complicates overall care & prognosis  ?  ?Pulmonary embolism: continue on eliquis  ?  ?Collagen vascular disease: continue on home dose of plaquenil  ? ? ? ?DVT prophylaxis: eliquis ?Code Status: full  ?Family Communication:  ?Disposition Plan: depends on PT/OT recs  ? ?Level of care: Telemetry Medical ? ?Status is: Inpatient ?Remains inpatient appropriate because: severity of illness, as stated above ? ? ? ?Consultants:  ? ? ?Procedures:  ? ?Antimicrobials: rocephin, azithromycin  ? ? ?Subjective: ?Pt c/o fatigue  ? ?Objective: ?Vitals:  ? 01/31/22 2023 01/31/22 2343 02/01/22 0402 02/01/22 0737  ?BP: 126/72 (!) 114/58 (!) 114/56 (!) 109/50  ?Pulse: 86 74 83 82  ?Resp: ?Temp: 98.3 ?F (36.8 ?C) 97.8 ?F (36.6 ?C) 98 ?F (36.7 ?C) 98.6 ?F (37 ?C)  ?TempSrc:      ?SpO2: 93% 94% 94% 95%  ?Weight:      ?Height:      ? ? ?Intake/Output Summary (Last 24 hours) at 02/01/2022 0754 ?Last data filed at 02/01/2022 [phone] ?Gross per 24 hour  ?Intake 150 ml  ?Output 250 ml  ?Net -100 ml  ? ?Filed Weights  ? 01/31/22 1147  ?Weight: 121.6 kg  ? ? ?Examination: ? ?General exam: Appears calm and comfortable. Morbidly obese ?Respiratory system: Clear to auscultation. Respiratory effort normal. ?Cardiovascular system: S1 & S2 +. No rubs, gallops or clicks.  ?Gastrointestinal system: Abdomen is obese, soft and nontender. Normal bowel sounds heard. ?Central nervous system: Alert and oriented. Moves all extremities  ?Psychiatry: Judgement and insight appear normal.   ? ? ? ?Data Reviewed: I have personally reviewed following labs and imaging studies ? ?CBC: ?Recent Labs  ?Lab 01/31/22 ?1154 02/01/22 ?0631  ?WBC 6.2 9.8  ?NEUTROABS 3.4  --   ?HGB 13.1 11.9*  ?HCT 42.8 37.6  ?MCV 97.1 95.2  ?PLT 152 214  ? ?Basic Metabolic Panel: ?Recent Labs  ?Lab 01/31/22 ?1154 02/01/22 ?0631  ?NA 141 140  ?K 3.9 3.5  ?CL 100 103  ?CO2 31 29  ?GLUCOSE 129* 181*  ?BUN 9 10  ?CREATININE 0.88 0.73  ?CALCIUM 8.5* 8.2*  ?MG 2.1  --   ? ?GFR: ?Estimated Creatinine Clearance: 79.2 mL/min (by C-G formula based on SCr of 0.73 mg/dL). ?Liver  Function Tests: ?Recent Labs  ?Lab 01/31/22 ?1154  ?AST 37  ?ALT 18  ?ALKPHOS 68  ?BILITOT 1.3*  ?PROT 6.8  ?ALBUMIN 3.5  ? ?No results for input(s): LIPASE, AMYLASE in the last 168 hours. ?No results for input(s): AMMONIA in the last 168 hours. ?Coagulation Profile: ?Recent Labs  ?Lab 01/31/22 ?1727  ?INR 1.0  ? ?Cardiac Enzymes: ?No results for input(s): CKTOTAL, CKMB, CKMBINDEX, TROPONINI in the last 168 hours. ?BNP (last 3 results) ?No results for input(s): PROBNP in the last 8760 hours. ?HbA1C: ?No results for input(s): HGBA1C in the last 72 hours. ?CBG: ?Recent Labs  ?Lab 01/31/22 ?1802  02/01/22 ?0117 02/01/22 ?0739  ?GLUCAP 243* 168* 160*  ? ?Lipid Profile: ?No results for input(s): CHOL, HDL, LDLCALC, TRIG, CHOLHDL, LDLDIRECT in the last 72 hours. ?Thyroid Function Tests: ?No results for input(s): TSH, T4TOTAL, FREET4, T3FREE, THYROIDAB in the last 72 hours. ?Anemia Panel: ?No results for input(s): VITAMINB12, FOLATE, FERRITIN, TIBC, IRON, RETICCTPCT in the last 72 hours. ?Sepsis Labs: ?Recent Labs  ?Lab 01/31/22 ?1153 01/31/22 ?1727  ?PROCALCITON  --  <0.10  ?LATICACIDVEN 2.0* 2.8*  ? ? ?Recent Results (from the past 240 hour(s))  ?Resp Panel by RT-PCR (Flu A&B, Covid) Nasopharyngeal Swab     Status: None  ? Collection Time: 01/31/22 11:53 AM  ? Specimen: Nasopharyngeal Swab; Nasopharyngeal(NP) swabs in vial transport medium  ?Result Value Ref Range Status  ? SARS Coronavirus 2 by RT PCR NEGATIVE NEGATIVE Final  ?  Comment: (NOTE) ?SARS-CoV-2 target nucleic acids are NOT DETECTED. ? ?The SARS-CoV-2 RNA is generally detectable in upper respiratory ?specimens during the acute phase of infection. The lowest ?concentration of SARS-CoV-2 viral copies this assay can detect is ?138 copies/mL. A negative result does not preclude SARS-Cov-2 ?infection and should not be used as the sole basis for treatment or ?other patient management decisions. A negative result may occur with  ?improper specimen collection/handling, submission of specimen other ?than nasopharyngeal swab, presence of viral mutation(s) within the ?areas targeted by this assay, and inadequate number of viral ?copies(<138 copies/mL). A negative result must be combined with ?clinical observations, patient history, and epidemiological ?information. The expected result is Negative. ? ?Fact Sheet for Patients:  ?BloggerCourse.com ? ?Fact Sheet for Healthcare Providers:  ?SeriousBroker.it ? ?This test is no t yet approved or cleared by the [place] FDA and  ?has been authorized for detection  and/or diagnosis of SARS-CoV-2 by ?FDA under an Emergency Use Authorization (EUA). This EUA will remain  ?in effect (meaning this test can be used) for the duration of the ?COVID-19 declaration under Section 564(b)(1) of the Act, 21 ?U.S.C.section 360bbb-3(b)(1), unless the authorization is terminated  ?or revoked sooner.  ? ? ?  ? Influenza A by PCR NEGATIVE NEGATIVE Final  ? Influenza B by PCR NEGATIVE NEGATIVE Final  ?  Comment: (NOTE) ?The Xpert Xpress SARS-CoV-2/FLU/RSV plus assay is intended as an aid ?in the diagnosis of influenza from Nasopharyngeal swab specimens and ?should not be used as a sole basis for treatment. Nasal washings and ?aspirates are unacceptable for Xpert Xpress SARS-CoV-2/FLU/RSV ?testing. ? ?Fact Sheet for Patients: ?BloggerCourse.com ? ?Fact Sheet for Healthcare Providers: ?SeriousBroker.it ? ?This test is not yet approved or cleared by the [place] FDA and ?has been authorized for detection and/or diagnosis of SARS-CoV-2 by ?FDA under an Emergency Use Authorization (EUA). This EUA will remain ?in effect (meaning this test can be used) for the duration of the ?COVID-19 declaration under Section 564(b)(1) of the  Act, 21 U.S.C. ?section 360bbb-3(b)(1), unless the authorization is terminated or ?revoked. ? ?Performed at [facility], [address] [place], ?[place] [id number] ?  ?Blood Culture (routine x 2)     Status: None (Preliminary result)  ? Collection Time: 01/31/22 11:54 AM  ? Specimen: BLOOD  ?Result Value Ref Range Status  ? Specimen Description BLOOD RIGHT ANTECUBITAL  Final  ? Special Requests   Final  ?  BOTTLES DRAWN AEROBIC AND ANAEROBIC Blood Culture results may not be optimal due to an inadequate volume of blood received in culture bottles  ? Culture   Final  ?  NO GROWTH < 24 HOURS ?Performed at [facility], [address] [place], [place] [address] ?  ? Report Status PENDING  Incomplete  ?Blood  Culture (routine x 2)     Status: None (Preliminary result)  ? Collection Time: 01/31/22  5:28 PM  ? Specimen: BLOOD  ?Result Value Ref Range Status  ? Specimen Description BLOOD RIGHT ANTECUBITAL  Merri BrunetteFina

## 2022-02-01 NOTE — Evaluation (Signed)
Physical Therapy Evaluation ?Patient Details ?Name: Ariel Alvarez ?MRN: [id number] ?DOB: Jul 01, 1946 ?Today's Date: 02/01/2022 ? ?History of Present Illness ? Patient is a 76 year old female with 1 week history of productive cough, fevers, diarrhea. Found to have community acquired pneumonia, acute respiratory failure with hypoxia, weakness of bilateral LE. medical history significant for morbid obesity, diabetes mellitus, history of CVA with left upper extremity weakness.  ?Clinical Impression ? Patient is agreeable to PT. She reports she needed physical assistance from family with mobility at home prior to hospital stay. Today, she needs significant assistance with mobility. She needed +2 person assistance for standing with standing tolerance limited to ~ 20 seconds. Unable to safety progress ambulation at this time due to limited activity tolerance and generalized weakness. Recommend to continue PT to maximize independence and facilitate return to prior level of function. SNF is recommended at discharge.  ?   ? ?Recommendations for follow up therapy are one component of a multi-disciplinary discharge planning process, led by the attending physician.  Recommendations may be updated based on patient status, additional functional criteria and insurance authorization. ? ?Follow Up Recommendations Skilled nursing-short term rehab (<3 hours/day) ? ?  ?Assistance Recommended at Discharge Frequent or constant Supervision/Assistance  ?Patient can return home with the following ? Two people to help with walking and/or transfers;A lot of help with bathing/dressing/bathroom;Direct supervision/assist for medications management;Assist for transportation;Help with stairs or ramp for entrance ? ?  ?Equipment Recommendations None recommended by PT  ?Recommendations for Other Services ?    ?  ?Functional Status Assessment Patient has had a recent decline in their functional status and demonstrates the ability to make significant  improvements in function in a reasonable and predictable amount of time.  ? ?  ?Precautions / Restrictions Precautions ?Precautions: Fall ?Restrictions ?Weight Bearing Restrictions: No  ? ?  ? ?Mobility ? Bed Mobility ?Overal bed mobility: Needs Assistance ?Bed Mobility: Supine to Sit, Sit to Supine ?  ?  ?Supine to sit: Max assist ?Sit to supine: Max assist ?  ?General bed mobility comments: assistance for trunk and BLE support. verbal cues for technique. bed mobility performed x 2 as patient required a rest break between activity bouts ?  ? ?Transfers ?Overall transfer level: Needs assistance ?Equipment used: None ?Transfers: Sit to/from Stand ?Sit to Stand: Max assist, +2 physical assistance ?  ?  ?  ?  ?  ?General transfer comment: 2 bouts of standing performed. verbal cues for hand placement and technique. patient performed incremental scooting to the right with 4 bouts with cues for hand placement, LE foot placement, technique ?  ? ?Ambulation/Gait ?  ?  ?  ?  ?  ?  ?  ?General Gait Details: limited stnading tolerance for progression of ambulation. ? ?Stairs ?  ?  ?  ?  ?  ? ?Wheelchair Mobility ?  ? ?Modified Rankin (Stroke Patients Only) ?  ? ?  ? ?Balance Overall balance assessment: Needs assistance ?Sitting-balance support: Feet supported ?Sitting balance-Leahy Scale: Poor ?Sitting balance - Comments: poor initially with posterior lean progressing to fair with increased sitting time. ?  ?Standing balance support: Single extremity supported ?Standing balance-Leahy Scale: Poor ?Standing balance comment: faciliation for midline as patient is unsteady in all directions ?  ?  ?  ?  ?  ?  ?  ?  ?  ?  ?  ?   ? ? ? ?Pertinent Vitals/Pain Pain Assessment ?Pain Assessment: No/denies pain  ? ? ?Home Living  Family/patient expects to be discharged to:: Private residence ?Living Arrangements: Spouse/significant other ?Available Help at Discharge: Family ?Type of Home: House ?Home Access: Ramped entrance ?  ?  ?  ?Home  Layout: One level ?Home Equipment: Shower seat (hemi walker) ?Additional Comments: she reports she was getting HHPT up until recently  ?  ?Prior Function Prior Level of Function : Needs assist ?  ?  ?  ?Physical Assist : Mobility (physical);ADLs (physical) ?  ?  ?Mobility Comments: walks with a hemi walker, assistance required from significant other for all mobility recently per patient report ?ADLs Comments: assistance from significant other for all ADLs per patient report ?  ? ? ?Hand Dominance  ? Dominant Hand: Right ? ?  ?Extremity/Trunk Assessment  ? Upper Extremity Assessment ?Upper Extremity Assessment: LUE deficits/detail;Defer to OT evaluation (residual deficits from previous stroke LUE) ?  ? ?Lower Extremity Assessment ?Lower Extremity Assessment: Generalized weakness (limited activity tolerance and difficulty following commands for formal MMT) ?  ? ?   ?Communication  ? Communication: No difficulties  ?Cognition Arousal/Alertness: Awake/alert ?Behavior During Therapy: [facility] for tasks assessed/performed ?Overall Cognitive Status: Within Functional Limits for tasks assessed ?  ?  ?  ?  ?  ?  ?  ?  ?  ?  ?  ?  ?  ?  ?  ?  ?General Comments: patient is able to follow single step commands with increased time. she overestimates physical abilities at times ?  ?  ? ?  ?General Comments General comments (skin integrity, edema, etc.): patient fatigued with activity and required rest breaks between bouts of functional mobility. Sp02 94% on 3 L02 while sitting on edge of bed ? ?  ?Exercises    ? ?Assessment/Plan  ?  ?PT Assessment Patient needs continued PT services  ?PT Problem List Decreased strength;Decreased range of motion;Decreased activity tolerance;Decreased balance;Decreased mobility;Obesity;Decreased safety awareness;Decreased knowledge of precautions;Decreased knowledge of use of DME;Decreased cognition ? ?   ?  ?PT Treatment Interventions DME instruction;Gait training;Functional mobility  training;Therapeutic activities;Therapeutic exercise;Balance training;Neuromuscular re-education;Cognitive remediation;Patient/family education;Wheelchair mobility training   ? ?PT Goals (Current goals can be found in the Care Plan section)  ?Acute Rehab PT Goals ?Patient Stated Goal: to be able to walk ?PT Goal Formulation: With patient ?Time For Goal Achievement: 02/15/22 ?Potential to Achieve Goals: Fair ? ?  ?Frequency Min 2X/week ?  ? ? ?Co-evaluation   ?  ?  ?  ?  ? ? ?  ?AM-PAC PT "6 Clicks" Mobility  ?Outcome Measure Help needed turning from your back to your side while in a flat bed without using bedrails?: A Lot ?Help needed moving from lying on your back to sitting on the side of a flat bed without using bedrails?: A Lot ?Help needed moving to and from a bed to a chair (including a wheelchair)?: Total ?Help needed standing up from a chair using your arms (e.g., wheelchair or bedside chair)?: Total ?Help needed to walk in hospital room?: Total ?Help needed climbing 3-5 steps with a railing? : Total ?6 Click Score: 8 ? ?  ?End of Session Equipment Utilized During Treatment: Oxygen ?Activity Tolerance: Patient limited by fatigue ?Patient left: in bed;with call bell/phone within reach;with chair alarm set ?  ?PT Visit Diagnosis: Unsteadiness on feet (R26.81);Muscle weakness (generalized) (M62.81) ?  ? ?Time: 0912-0950 ?PT Time Calculation (min) (ACUTE ONLY): 38 min ? ? ?Charges:   PT Evaluation ?$PT Eval Low Complexity: 1 Low ?PT Treatments ?$Therapeutic Activity: 8-22 mins ?  ?   ? ? ?  Donna Bernard, PT, MPT ? ? ?Ina Homes ?02/01/2022, 1:19 PM ? ?

## 2022-02-01 NOTE — Evaluation (Signed)
Occupational Therapy Evaluation ?Patient Details ?Name: Ariel Alvarez ?MRN: [id number] ?DOB: 1945-11-25 ?Today's Date: 02/01/2022 ? ? ?History of Present Illness Patient is a 76 year old female with 1 week history of productive cough, fevers, diarrhea. Found to have community acquired pneumonia, acute respiratory failure with hypoxia, weakness of bilateral LE. medical history significant for morbid obesity, diabetes mellitus, history of CVA with left upper extremity weakness.  ? ?Clinical Impression ?  ?Patient presenting with decreased Ind in self care, balance, functional mobility/transfers, endurance, and safety awareness. No family present to confirm baseline. Pt reports living with significant other at baseline. She endorses ambulation with hemi walker and assist as needed with self care tasks. Pt has residual deficits to L UE with trace movement from CVA ~ 10 years ago. Pt's R side also appears to be weak need therefore pt needing max- total A for bed mobility and self care tasks this session. Pt also reporting increased fatigue. Patient will benefit from acute OT to increase overall independence in the areas of ADLs, functional mobility, and safety awareness in order to safely discharge to next venue of care. ?   ? ?Recommendations for follow up therapy are one component of a multi-disciplinary discharge planning process, led by the attending physician.  Recommendations may be updated based on patient status, additional functional criteria and insurance authorization.  ? ?Follow Up Recommendations ? Skilled nursing-short term rehab (<3 hours/day)  ?  ?Assistance Recommended at Discharge Frequent or constant Supervision/Assistance  ?Patient can return home with the following A lot of help with bathing/dressing/bathroom;Assistance with cooking/housework;Two people to help with walking and/or transfers;Direct supervision/assist for medications management;Direct supervision/assist for financial management;Assist  for transportation;Help with stairs or ramp for entrance ? ?  ?Functional Status Assessment ? Patient has had a recent decline in their functional status and demonstrates the ability to make significant improvements in function in a reasonable and predictable amount of time.  ?Equipment Recommendations ? Other (comment) (defer to next venue of care)  ?  ?Recommendations for Other Services   ? ? ?  ?Precautions / Restrictions Precautions ?Precautions: Fall ?Restrictions ?Weight Bearing Restrictions: No  ? ?  ? ?Mobility Bed Mobility ?Overal bed mobility: Needs Assistance ?Bed Mobility: Supine to Sit, Sit to Supine, Rolling ?Rolling: Max assist ?  ?Supine to sit: Max assist ?Sit to supine: Max assist ?  ?General bed mobility comments: heavy max A for B LEs and trunk ?  ? ?Transfers ?  ?  ?  ?  ?  ?  ?  ?  ?  ?General transfer comment: not attempted secondary to lethargy ?  ? ?  ?Balance Overall balance assessment: Needs assistance ?Sitting-balance support: Feet supported ?Sitting balance-Leahy Scale: Poor ?Sitting balance - Comments: poor initially with posterior lean progressing to fair with increased sitting time. ?  ?  ?  ?  ?  ?  ?  ?  ?  ?  ?  ?  ?  ?  ?  ?   ? ?ADL either performed or assessed with clinical judgement  ? ?ADL Overall ADL's : Needs assistance/impaired ?  ?  ?  ?  ?  ?  ?  ?  ?  ?  ?  ?  ?  ?  ?  ?  ?  ?  ?  ?General ADL Comments: max- total A for self care tasks  ? ? ? ?Vision Patient Visual Report: No change from baseline ?   ?   ?   ?   ? ?  Pertinent Vitals/Pain Pain Assessment ?Pain Assessment: No/denies pain  ? ? ? ?Hand Dominance Right ?  ?Extremity/Trunk Assessment Upper Extremity Assessment ?Upper Extremity Assessment: LUE deficits/detail;Generalized weakness ?LUE Deficits / Details: trace movement throught from residual stroke ~ 10 years ago ?  ?Lower Extremity Assessment ?Lower Extremity Assessment: Generalized weakness ?  ?  ?  ?Communication Communication ?Communication: No  difficulties ?  ?Cognition Arousal/Alertness: Awake/alert ?Behavior During Therapy: [facility] for tasks assessed/performed ?Overall Cognitive Status: Within Functional Limits for tasks assessed ?  ?  ?  ?  ?  ?  ?  ?  ?  ?  ?  ?  ?  ?  ?  ?  ?General Comments: Pt is pleasant overall but needs increased cuing for tasks and to follow commands. ?  ?  ?General Comments  patient fatigued with activity and required rest breaks between bouts of functional mobility. Sp02 94% on 3 L02 while sitting on edge of bed ? ?  ?   ?   ? ? ?Home Living Family/patient expects to be discharged to:: Private residence ?Living Arrangements: Spouse/significant other ?Available Help at Discharge: Family ?Type of Home: House ?Home Access: Ramped entrance ?  ?  ?Home Layout: One level ?  ?  ?Bathroom Shower/Tub: Walk-in shower ?  ?Bathroom Toilet: Standard ?  ?  ?Home Equipment: Shower seat ?  ?Additional Comments: she reports she was getting HHPT up until recently ?  ? ?  ?Prior Functioning/Environment Prior Level of Function : Needs assist ?  ?  ?  ?Physical Assist : Mobility (physical);ADLs (physical) ?  ?  ?Mobility Comments: walks with a hemi walker, assistance required from significant other for all mobility recently per patient report ?ADLs Comments: assistance from significant other for all ADLs per patient report ?  ? ?  ?  ?OT Problem List: Decreased strength;Decreased activity tolerance;Impaired balance (sitting and/or standing);Decreased safety awareness;Decreased knowledge of precautions;Decreased knowledge of use of DME or AE;Decreased cognition ?  ?   ?OT Treatment/Interventions: Self-care/ADL training;Therapeutic exercise;Therapeutic activities;Energy conservation;DME and/or AE instruction;Patient/family education;Manual therapy;Balance training;Modalities  ?  ?OT Goals(Current goals can be found in the care plan section) Acute Rehab OT Goals ?Patient Stated Goal: to rest and take a nap ?OT Goal Formulation: With patient ?Time For  Goal Achievement: 02/15/22 ?Potential to Achieve Goals: Fair ?ADL Goals ?Pt Will Perform Grooming: with set-up;with supervision;sitting ?Pt Will Perform Lower Body Dressing: sit to/from stand;with mod assist ?Pt Will Transfer to Toilet: with mod assist;stand pivot transfer ?Pt Will Perform Toileting - Clothing Manipulation and hygiene: with mod assist;sit to/from stand  ?OT Frequency: Min 2X/week ?  ? ?   ?AM-PAC OT "6 Clicks" Daily Activity     ?Outcome Measure Help from another person eating meals?: A Little ?Help from another person taking care of personal grooming?: A Little ?Help from another person toileting, which includes using toliet, bedpan, or urinal?: Total ?Help from another person bathing (including washing, rinsing, drying)?: A Lot ?Help from another person to put on and taking off regular upper body clothing?: A Lot ?Help from another person to put on and taking off regular lower body clothing?: Total ?6 Click Score: 12 ?  ?End of Session Equipment Utilized During Treatment: Rolling walker (2 wheels) ?Nurse Communication: Mobility status ? ?Activity Tolerance: Patient limited by fatigue ?Patient left: in bed;with call bell/phone within reach;with bed alarm set ? ?OT Visit Diagnosis: Unsteadiness on feet (R26.81);Muscle weakness (generalized) (M62.81)  ?              ?  Time: 1050-1106 ?OT Time Calculation (min): 16 min ?Charges:  OT General Charges ?$OT Visit: 1 Visit ?OT Evaluation ?$OT Eval Moderate Complexity: 1 Mod ?OT Treatments ?$Therapeutic Activity: 8-22 mins ? ?Darleen Crocker, [place], OTR/L , CBIS ?ascom [phone]  ?02/01/22, 2:32 PM  ?

## 2022-02-01 NOTE — Progress Notes (Addendum)
1130 ?600mg  tab cut in half 300mg  tab wasted with Horatio PelAna R RN   ? ?1346 ?[facility]  Applied 2L [place] O2 pt sitting to edge of bed sat 95% ? ?1601 ?Pt requested klonopin for her anxiety, but last dose received at 130am. Order states klonopin daily prn. Dr Mayford KnifeWilliams aware and states ok to give a NOW dose of PO klonopin and will place order  ? ?1731 ?Pt resting quietly 100% on 2L. Nurse d/c O2 pt 97% on room air. Will continue to monitor ? ?Shift summary ?Pt [facility] O2 sat remain above 95% on room air. Pt using IS and weaned completely off O2 this shift.  ?

## 2022-02-02 DIAGNOSIS — E876 Hypokalemia: Secondary | ICD-10-CM | POA: Diagnosis not present

## 2022-02-02 DIAGNOSIS — J9601 Acute respiratory failure with hypoxia: Secondary | ICD-10-CM | POA: Diagnosis not present

## 2022-02-02 DIAGNOSIS — J189 Pneumonia, unspecified organism: Secondary | ICD-10-CM | POA: Diagnosis not present

## 2022-02-02 DIAGNOSIS — E872 Acidosis, unspecified: Secondary | ICD-10-CM | POA: Diagnosis not present

## 2022-02-02 LAB — URINALYSIS, COMPLETE (UACMP) WITH MICROSCOPIC
Bilirubin Urine: NEGATIVE
Glucose, UA: NEGATIVE mg/dL
Hgb urine dipstick: NEGATIVE
Ketones, ur: NEGATIVE mg/dL
Leukocytes,Ua: NEGATIVE
Nitrite: NEGATIVE
Protein, ur: 30 mg/dL — AB
Specific Gravity, Urine: 1.026 (ref 1.005–1.030)
pH: 5 (ref 5.0–8.0)

## 2022-02-02 LAB — BASIC METABOLIC PANEL
Anion gap: 8 (ref 5–15)
BUN: 12 mg/dL (ref 8–23)
CO2: 30 mmol/L (ref 22–32)
Calcium: 8.3 mg/dL — ABNORMAL LOW (ref 8.9–10.3)
Chloride: 107 mmol/L (ref 98–111)
Creatinine, Ser: 0.78 mg/dL (ref 0.44–1.00)
GFR, Estimated: >60 mL/min (ref >60)
Glucose, Bld: 110 mg/dL — ABNORMAL HIGH (ref 70–99)
Potassium: 3.4 mmol/L — ABNORMAL LOW (ref 3.5–5.1)
Sodium: 145 mmol/L (ref 135–145)

## 2022-02-02 LAB — CBC
HCT: 37.3 % (ref 36.0–46.0)
Hemoglobin: 11.3 g/dL — ABNORMAL LOW (ref 12.0–15.0)
MCH: 29.7 pg (ref 26.0–34.0)
MCHC: 30.3 g/dL (ref 30.0–36.0)
MCV: 98.2 fL (ref 80.0–100.0)
Platelets: 234 10*3/uL (ref 150–400)
RBC: 3.8 MIL/uL — ABNORMAL LOW (ref 3.87–5.11)
RDW: 17.1 % — ABNORMAL HIGH (ref 11.5–15.5)
WBC: 8.3 10*3/uL (ref 4.0–10.5)
nRBC: 0 % (ref 0.0–0.2)

## 2022-02-02 LAB — GLUCOSE, CAPILLARY
Glucose-Capillary: 95 mg/dL (ref 70–99)
Glucose-Capillary: 137 mg/dL — ABNORMAL HIGH (ref 70–99)
Glucose-Capillary: 122 mg/dL — ABNORMAL HIGH (ref 70–99)
Glucose-Capillary: 126 mg/dL — ABNORMAL HIGH (ref 70–99)

## 2022-02-02 LAB — HEMOGLOBIN A1C
Hgb A1c MFr Bld: 6.8 % — ABNORMAL HIGH (ref 4.8–5.6)
Mean Plasma Glucose: 148 mg/dL

## 2022-02-02 LAB — LACTIC ACID, PLASMA: Lactic Acid, Venous: 0.8 mmol/L (ref 0.5–1.9)

## 2022-02-02 MED ORDER — POTASSIUM CHLORIDE CRYS ER 20 MEQ PO TBCR
20.0000 meq | EXTENDED_RELEASE_TABLET | Freq: Once | ORAL | Status: AC
Start: 2022-02-02 — End: 2022-02-02
  Administered 2022-02-02: 20 meq via ORAL
  Filled 2022-02-02: qty 1

## 2022-02-02 NOTE — Plan of Care (Signed)
?  Problem: Activity: ?Goal: Ability to tolerate increased activity will improve ?Outcome: Progressing ?  ?Problem: Clinical Measurements: ?Goal: Ability to maintain a body temperature in the normal range will improve ?Outcome: Progressing ?  ?Problem: Respiratory: ?Goal: Ability to maintain adequate ventilation will improve ?Outcome: Progressing ?Goal: Ability to maintain a clear airway will improve ?Outcome: Progressing ?  ?Problem: Health Behavior/Discharge Planning: ?Goal: Ability to manage health-related needs will improve ?Outcome: Progressing ?  ?Problem: Clinical Measurements: ?Goal: Will remain free from infection ?Outcome: Progressing ?Goal: Diagnostic test results will improve ?Outcome: Progressing ?Goal: Respiratory complications will improve ?Outcome: Progressing ?  ?

## 2022-02-02 NOTE — Progress Notes (Signed)
Physical Therapy Treatment ?Patient Details ?Name: Ariel Alvarez ?MRN: [id number] ?DOB: 07/10/1946 ?Today's Date: 02/02/2022 ? ? ?History of Present Illness Patient is a 76 year old female with 1 week history of productive cough, fevers, diarrhea. Found to have community acquired pneumonia, acute respiratory failure with hypoxia, weakness of bilateral LE. medical history significant for morbid obesity, diabetes mellitus, history of CVA with left upper extremity weakness. ? ?  ?PT Comments  ? ? Pt was pleasant and motivated to participate during the session and put forth good effort throughout. Pt required heavy +2 assist with bed mobility tasks as well as cues for proper sequencing.  Pt was able to come to standing from an elevated surface with +2 Mod A twice with max standing tolerance around 20-30 sec each.  Ambulation attempted with pt able to slightly advance her RLE to the side but could not advance her LLE.  Pt will benefit from PT services in a SNF setting upon discharge to safely address deficits listed in patient problem list for decreased caregiver assistance and eventual return to PLOF. ? ?   ?Recommendations for follow up therapy are one component of a multi-disciplinary discharge planning process, led by the attending physician.  Recommendations may be updated based on patient status, additional functional criteria and insurance authorization. ? ?Follow Up Recommendations ? Skilled nursing-short term rehab (<3 hours/day) ?  ?  ?Assistance Recommended at Discharge Frequent or constant Supervision/Assistance  ?Patient can return home with the following Two people to help with walking and/or transfers;A lot of help with bathing/dressing/bathroom;Direct supervision/assist for medications management;Assist for transportation;Help with stairs or ramp for entrance ?  ?Equipment Recommendations ? None recommended by PT  ?  ?Recommendations for Other Services   ? ? ?  ?Precautions / Restrictions  Precautions ?Precautions: Fall ?Restrictions ?Weight Bearing Restrictions: No  ?  ? ?Mobility ? Bed Mobility ?Overal bed mobility: Needs Assistance ?Bed Mobility: Supine to Sit, Sit to Supine ?  ?  ?Supine to sit: Max assist, +2 for physical assistance ?Sit to supine: Max assist, +2 for physical assistance ?  ?General bed mobility comments: heavy max A for BLEs and trunk ?  ? ?Transfers ?Overall transfer level: Needs assistance ?Equipment used: Rolling walker (2 wheels) ?Transfers: Sit to/from Stand ?Sit to Stand: Mod assist, +2 physical assistance, From elevated surface ?  ?  ?  ?  ?  ?General transfer comment: Mod A to come to standing and heavy cuing for increased trunk flexion ?  ? ?Ambulation/Gait ?  ?  ?  ?  ?  ?  ?  ?General Gait Details: Pt able to slightly move RLE to the side but unable to advance the LLE during amb attempt ? ? ?Stairs ?  ?  ?  ?  ?  ? ? ?Wheelchair Mobility ?  ? ?Modified Rankin (Stroke Patients Only) ?  ? ? ?  ?Balance Overall balance assessment: Needs assistance ?Sitting-balance support: Feet supported ?Sitting balance-Leahy Scale: Fair ?  ?  ?Standing balance support: Single extremity supported, During functional activity ?Standing balance-Leahy Scale: Poor ?  ?  ?  ?  ?  ?  ?  ?  ?  ?  ?  ?  ?  ? ?  ?Cognition Arousal/Alertness: Awake/alert ?Behavior During Therapy: [facility] for tasks assessed/performed ?Overall Cognitive Status: Within Functional Limits for tasks assessed ?  ?  ?  ?  ?  ?  ?  ?  ?  ?  ?  ?  ?  ?  ?  ?  ?  ?  ?  ? ?  ?  Exercises Total Joint Exercises ?Ankle Circles/Pumps: AROM, Strengthening, Both, 10 reps (manual resistance on the RLE) ?Quad Sets: Strengthening, Both, 5 reps, 10 reps ?Heel Slides: AAROM, Strengthening, Both, 5 reps ?Hip ABduction/ADduction: AAROM, Strengthening, Both, 10 reps ?Straight Leg Raises: AAROM, Strengthening, Both, 10 reps ? ?  ?General Comments   ?  ?  ? ?Pertinent Vitals/Pain Pain Assessment ?Pain Assessment: No/denies pain  ? ? ?Home Living    ?  ?  ?  ?  ?  ?  ?  ?  ?  ?   ?  ?Prior Function    ?  ?  ?   ? ?PT Goals (current goals can now be found in the care plan section) Progress towards PT goals: Progressing toward goals ? ?  ?Frequency ? ? ? Min 2X/week ? ? ? ?  ?PT Plan Current plan remains appropriate  ? ? ?Co-evaluation   ?  ?  ?  ?  ? ?  ?AM-PAC PT "6 Clicks" Mobility   ?Outcome Measure ? Help needed turning from your back to your side while in a flat bed without using bedrails?: A Lot ?Help needed moving from lying on your back to sitting on the side of a flat bed without using bedrails?: A Lot ?Help needed moving to and from a bed to a chair (including a wheelchair)?: Total ?Help needed standing up from a chair using your arms (e.g., wheelchair or bedside chair)?: A Lot ?Help needed to walk in hospital room?: Total ?Help needed climbing 3-5 steps with a railing? : Total ?6 Click Score: 9 ? ?  ?End of Session Equipment Utilized During Treatment: Gait belt;Oxygen ?Activity Tolerance: Patient tolerated treatment well ?Patient left: in bed;with call bell/phone within reach;with bed alarm set ?Nurse Communication: Mobility status ?PT Visit Diagnosis: Unsteadiness on feet (R26.81);Muscle weakness (generalized) (M62.81) ?  ? ? ?Time: 1350-1416 ?PT Time Calculation (min) (ACUTE ONLY): 26 min ? ?Charges:  $Therapeutic Exercise: 8-22 mins ?$Therapeutic Activity: 8-22 mins          ?          ?D. Elly Modena PT, DPT ?02/02/22, 2:26 PM ? ? ?

## 2022-02-02 NOTE — NC FL2 (Signed)
?[place] MEDICAID FL2 LEVEL OF CARE SCREENING TOOL  ?  ? ?IDENTIFICATION  ?Patient Name: ?Ariel Alvarez Birthdate: 05-15-1946 Sex: female Admission Date (Current Location): ?01/31/2022  ?[place] Number: ?  ?  Facility and Address:  ?[facility], [address] [place], [place] [address] ?     Provider Number: ?[id number]  ?Attending Physician Name and Address:  ?Charise Killian, MD ? Relative Name and Phone Number:  ?Alric Seton [phone] ?   ?Current Level of Care: ?Hospital Recommended Level of Care: ?Skilled Nursing Facility Prior Approval Number: ?  ? ?Date Approved/Denied: ?  PASRR Number: ?[id number] A ? ?Discharge Plan: ?SNF ?  ? ?Current Diagnoses: ?Patient Active Problem List  ? Diagnosis Date Noted  ? CAP (community acquired pneumonia) 01/31/2022  ? Diabetes (HCC) 01/31/2022  ? Acute respiratory failure with hypoxia (HCC) 01/31/2022  ? Weakness of both lower extremities 01/31/2022  ? Lactic acidosis 01/31/2022  ? Moderate episode of recurrent major depressive disorder (HCC) 10/03/2021  ? Cognitive disorder 10/03/2021  ? Episodic mood disorder (HCC) 10/03/2021  ? Psychosis (HCC) 10/03/2021  ? Gait disturbance 12/19/2020  ? Rash 07/25/2020  ? Increased frequency of urination 07/25/2020  ? Closed nondisplaced fracture of proximal phalanx of left little finger with routine healing 04/07/2020  ? Hyperlipidemia 12/08/2019  ? Stroke (HCC) 12/08/2019  ? Ulcerated, foot, unspecified laterality, limited to breakdown of skin (HCC) 12/08/2019  ? Other constipation 09/28/2019  ? Dysphagia 09/28/2019  ? Pelvic pain in female 05/18/2019  ? Age-related osteoporosis without current pathological fracture 08/08/2018  ? Urinary hesitancy 10/04/2017  ? Routine health maintenance 08/26/2017  ? Candidiasis 08/11/2017  ? Morbid obesity with BMI of 40.0-44.9, adult (HCC) 07/30/2017  ? Collagen vascular disease (HCC) 05/21/2017  ? 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)  05/21/2017  ? Chronic pain 05/21/2017  ? Cerebrovascular 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) 05/06/2017  ? Preoperative clearance 05/06/2017  ? DVT (deep venous thrombosis) (HCC) 03/24/2017  ? Contracture of joint of left hand 02/17/2017  ? Other pulmonary embolism without acute cor pulmonale (HCC) 02/06/2017  ? Right pontine stroke (HCC) 02/03/2017  ? Vasculitis (HCC) 01/29/2017  ? Abnormal ANCA test 12/30/2016  ? Right carpal tunnel syndrome 04/06/2016  ? GERD (gastroesophageal reflux disease) 12/31/2014  ? Dyslipidemia 04/06/2014  ? Calculus of kidney and ureter 05/06/2012  ? Primary insomnia 07/06/2011  ? Hypothyroidism 07/06/2011  ? Depressive disorder 07/06/2011  ? Spastic hemiplegia affecting nondominant side (HCC) 03/27/2011  ? ? ?Orientation RESPIRATION BLADDER Height & Weight   ?  ?Self, Time, Situation, Place ? Normal, O2 (3 liters) Continent Weight: 121.6 kg ?Height:   (160 cm)  ?BEHAVIORAL SYMPTOMS/MOOD NEUROLOGICAL BOWEL NUTRITION STATUS  ?    Continent Diet (see dc summary)  ?AMBULATORY STATUS COMMUNICATION OF NEEDS Skin   ?Extensive Assist Verbally Normal ?  ?  ?  ?    ?     ?     ? ? ?Personal Care Assistance Level of Assistance  ?Bathing, Feeding, Dressing Bathing Assistance: Maximum assistance ?Feeding assistance: Limited assistance ?Dressing Assistance: Maximum assistance ?   ? ?Functional Limitations Info  ?    ?  ?   ? ? ?SPECIAL CARE FACTORS FREQUENCY  ?PT (By licensed PT), OT (By licensed OT)   ?  ?PT Frequency: 5 times per week ?OT Frequency: 5 times per week ?  ?  ?  ?   ? ? ?Contractures Contractures  Info: Not present  ? ? ?Additional Factors Info  ?Code Status, Allergies Code Status Info: full code ?Allergies Info: Latex, Prednisone ?  ?  ?  ?   ? ?Current Medications (02/02/2022):  This is the current hospital active medication list ?Current Facility-Administered Medications  ?Medication Dose Route Frequency Provider Last Rate Last Admin  ?  acetaminophen (TYLENOL) tablet 325 mg  325 mg Oral Q6H PRN Agbata, Tochukwu, MD   325 mg at 02/02/22 0753  ? acyclovir ointment (ZOVIRAX) 5 %   Topical 5 X Daily Foye Deer, [facility]   Given at 02/02/22 1100  ? apixaban (ELIQUIS) tablet 5 mg  5 mg Oral BID Agbata, Tochukwu, MD   5 mg at 02/02/22 1058  ? aspirin EC tablet 81 mg  81 mg Oral Daily Agbata, Tochukwu, MD   81 mg at 02/02/22 1058  ? atorvastatin (LIPITOR) tablet 40 mg  40 mg Oral BH-q7a Agbata, Tochukwu, MD   40 mg at 02/02/22 1059  ? azithromycin (ZITHROMAX) 500 mg in sodium chloride 0.9 % 250 mL IVPB  500 mg Intravenous Q24H Agbata, Tochukwu, MD 250 mL/hr at 02/01/22 1127 500 mg at 02/01/22 1127  ? cefTRIAXone (ROCEPHIN) 2 g in sodium chloride 0.9 % 100 mL IVPB  2 g Intravenous Q24H Agbata, Tochukwu, MD 200 mL/hr at 02/02/22 1103 2 g at 02/02/22 1103  ? clonazePAM (KLONOPIN) tablet 1 mg  1 mg Oral Daily PRN Agbata, Tochukwu, MD   1 mg at 02/01/22 0135  ? folic acid (FOLVITE) tablet 1 mg  1 mg Oral Daily Agbata, Tochukwu, MD   1 mg at 02/02/22 1059  ? gabapentin (NEURONTIN) tablet 300 mg  300 mg Oral BID Charise Killian, MD   300 mg at 02/02/22 1000  ? hydroxychloroquine (PLAQUENIL) tablet 400 mg  400 mg Oral Daily Agbata, Tochukwu, MD   400 mg at 02/02/22 1059  ? insulin aspart (novoLOG) injection 0-15 Units  0-15 Units Subcutaneous TID WC Agbata, Tochukwu, MD   3 Units at 02/01/22 1318  ? ipratropium-albuterol (DUONEB) 0.5-2.5 (3) MG/3ML nebulizer solution 3 mL  3 mL Nebulization Q6H PRN Agbata, Tochukwu, MD   3 mL at 02/02/22 0752  ? levothyroxine (SYNTHROID) tablet 100 mcg  100 mcg Oral QAC breakfast Agbata, Tochukwu, MD   100 mcg at 02/02/22 0505  ? loratadine (CLARITIN) tablet 10 mg  10 mg Oral Daily Agbata, Tochukwu, MD   10 mg at 02/02/22 1059  ? ondansetron (ZOFRAN) tablet 4 mg  4 mg Oral Q6H PRN Agbata, Tochukwu, MD      ? Or  ? ondansetron (ZOFRAN) injection 4 mg  4 mg Intravenous Q6H PRN Agbata, Tochukwu, MD      ? pantoprazole (PROTONIX)  EC tablet 40 mg  40 mg Oral Daily Agbata, Tochukwu, MD   40 mg at 02/02/22 1058  ? QUEtiapine (SEROQUEL) tablet 75 mg  75 mg Oral QHS Agbata, Tochukwu, MD   75 mg at 02/01/22 2123  ? venlafaxine XR (EFFEXOR-XR) 24 hr capsule 75 mg  75 mg Oral Q breakfast Agbata, Tochukwu, MD   75 mg at 02/02/22 1058  ? [START ON 02/04/2022] Vitamin D (Ergocalciferol) (DRISDOL) capsule 50,000 Units  50,000 Units Oral Daily Agbata, Tochukwu, MD      ? ? ? ?Discharge Medications: ?Please see discharge summary for a list of discharge medications. ? ?Relevant Imaging Results: ? ?Relevant Lab Results: ? ? ?Additional Information ?SS: [id number] ? ?Marlowe Sax, RN ? ? ? ? ?

## 2022-02-02 NOTE — Care Management Important Message (Signed)
Important Message ? ?Patient Details  ?Name: Ariel Alvarez ?MRN: [id number] ?Date of Birth: 1946-07-02 ? ? ?Medicare Important Message Given:  Yes ? ?I have reviewed the Important Message from Medicare and obtained her signature. I left a copy with her and thanked her for her time. ? ? ?Juliann Pulse A Ediberto Sens ?02/02/2022, 2:19 PM ?

## 2022-02-02 NOTE — TOC Progression Note (Signed)
Transition of Care (TOC) - Progression Note  ? ? ?Patient Details  ?Name: Ariel Alvarez ?MRN: [id number] ?Date of Birth: June 15, 1946 ? ?Transition of Care (TOC) CM/SW Contact  ?Marlowe Sax, RN ?Phone Number: ?02/02/2022, 2:38 PM ? ?Clinical Narrative:   Reviewed the bed offers with the patient and her husband, [facility], I notified [facility] and plan to DC on Monday, TOC to start the Ins approval for [facility] on Sunday ? ? ? ?Expected Discharge Plan: Skilled Nursing Facility ?Barriers to Discharge: Continued Medical Work up, English as a second language teacher, SNF Pending bed offer ? ?Expected Discharge Plan and Services ?Expected Discharge Plan: Skilled Nursing Facility ?  ?Discharge Planning Services: CM Consult ?  ?Living arrangements for the past 2 months: Single Family Home ?                ?  ?  ?  ?  ?  ?  ?  ?  ?  ?  ? ? ?Social Determinants of Health (SDOH) Interventions ?  ? ?Readmission Risk Interventions ?   ? View : No data to display.  ?  ?  ?  ? ? ?

## 2022-02-02 NOTE — TOC Progression Note (Signed)
Transition of Care (TOC) - Progression Note  ? ? ?Patient Details  ?Name: Ariel Alvarez ?MRN: [id number] ?Date of Birth: [birth year] ? ?Transition of Care (TOC) CM/SW Contact  ?Marlowe Sax, RN ?Phone Number: ?02/02/2022, 11:07 AM ? ?Clinical Narrative:    ?The patient is awake, alert and oriented, I went in and spoke to her and called her husband Molly Maduro while in the room, She is agreeable to go to STR SNF ? ?Bedsearch sent, will review bed offers once obtained ? ?Expected Discharge Plan: Home/Self Care ?Barriers to Discharge: Continued Medical Work up ? ?Expected Discharge Plan and Services ?Expected Discharge Plan: Home/Self Care ?  ?Discharge Planning Services: CM Consult ?  ?Living arrangements for the past 2 months: Single Family Home ?                ?  ?  ?  ?  ?  ?  ?  ?  ?  ?  ? ? ?Social Determinants of Health (SDOH) Interventions ?  ? ?Readmission Risk Interventions ?   ? View : No data to display.  ?  ?  ?  ? ? ?

## 2022-02-02 NOTE — TOC Progression Note (Signed)
Transition of Care (TOC) - Progression Note  ? ? ?Patient Details  ?Name: Ariel Alvarez ?MRN: [id number] ?Date of Birth: December 17, 1945 ? ?Transition of Care (TOC) CM/SW Contact  ?Conception Oms, RN ?Phone Number: ?02/02/2022, 9:16 AM ? ?Clinical Narrative:   Met with the patient at the bedside, she is very sleepy not easily aroused.  She woke up and opened her eyes and went right back to sleep.  Unable to talk to her about STR SNF at this time, I will go back and speak with her when she is more awake ? ? ?Expected Discharge Plan: Home/Self Care ?Barriers to Discharge: Continued Medical Work up ? ?Expected Discharge Plan and Services ?Expected Discharge Plan: Home/Self Care ?  ?Discharge Planning Services: CM Consult ?  ?Living arrangements for the past 2 months: [facility] ?                ?  ?  ?  ?  ?  ?  ?  ?  ?  ?  ? ? ?Social Determinants of Health (SDOH) Interventions ?  ? ?Readmission Risk Interventions ?   ? View : No data to display.  ?  ?  ?  ? ? ?

## 2022-02-02 NOTE — Progress Notes (Signed)
?PROGRESS NOTE ? ? ? ?Ariel Alvarez  KDT:[id number] DOB: Jul 08, 1946 DOA: 01/31/2022 ?PCP: Enid Baas, MD  ? ?Assessment & Plan: ?  ?Principal Problem: ?  CAP (community acquired pneumonia) ?Active Problems: ?  Acute respiratory failure with hypoxia (HCC) ?  Collagen vascular disease (HCC) ?  Other pulmonary embolism without acute cor pulmonale (HCC) ?  Morbid obesity with BMI of 40.0-44.9, adult (HCC) ?  Hypothyroidism ?  Depressive disorder ?  Diabetes (HCC) ?  Weakness of both lower extremities ?  Lactic acidosis ? ? ?CAP: continue on IV rocephin, azithromycin, bronchodilators & encourage incentive spirometry. Continue on supplemental oxygen and wean as tolerated ?  ?Acute hypoxic respiratory failure: likely secondary to underlying community-acquired pneumonia. Upon arrival to the ER she had room air pulse oximetry of 87%. Continue on supplemental oxygen and wean as tolerated ? ?Hypokalemia: potassium given ?  ?Lactic acidosis: resolved  ?  ?Weakness of both lower extremities: PT/OT recs SNF  ?  ?DM2: pretty well controlled, HbA1c 6.8. Continue on SSI w/ accuchecks  ?  ?Depressive disorder: severity unknown. Continue on home dose of seroquel, effexor  ?  ?Hypothyroidism: continue on home dose of levothyroxine  ?  ?Morbid obesity: BMI 47.4. Complicates overall care & prognosis  ?  ?Pulmonary embolism: continue on eliquis  ?  ?Collagen vascular disease: continue on home dose of plaquenil  ? ? ? ?DVT prophylaxis: eliquis ?Code Status: full  ?Family Communication: discussed pt's care w/ pt's husband, Molly Maduro, & answered his questions ?Disposition Plan: likely d/c to SNF ? ?Level of care: Telemetry Medical ? ?Status is: Inpatient ?Remains inpatient appropriate because: severity of illness, as stated above ? ? ? ?Consultants:  ? ? ?Procedures:  ? ?Antimicrobials: rocephin, azithromycin  ? ? ?Subjective: ?Pt c/o wheezing ? ?Objective: ?Vitals:  ? 02/01/22 1958 02/01/22 2233 02/02/22 0018 02/02/22 0510  ?BP: (!)  120/53  (!) 120/51 (!) 99/59  ?Pulse: 81  75 65  ?Resp: 17  18 18   ?Temp: (!) 97.5 ?F (36.4 ?C)  98.1 ?F (36.7 ?C) (!) 97.5 ?F (36.4 ?C)  ?TempSrc:      ?SpO2: 90% (!) 86% 93% 96%  ?Weight:      ?Height:      ? ? ?Intake/Output Summary (Last 24 hours) at 02/02/2022 0651 ?Last data filed at 02/02/2022 0600 ?Gross per 24 hour  ?Intake 1894.89 ml  ?Output 450 ml  ?Net 1444.89 ml  ? ?Filed Weights  ? 01/31/22 1147  ?Weight: 121.6 kg  ? ? ?Examination: ? ?General exam: Appears uncomfortable. Morbidly obese ?Respiratory system: course breath sounds b/l. Wheezes b/l  ?Cardiovascular system: S1/S2+. No rubs or clicks  ?Gastrointestinal system: Abd is soft, NT, obese & hypoactive bowel sounds ?Central nervous system: alert and oriented. Moves all extremities  ?Psychiatry: judgement and insight appears poor. Flat mood and affect ? ? ? ?Data Reviewed: I have personally reviewed following labs and imaging studies ? ?CBC: ?Recent Labs  ?Lab 01/31/22 ?1154 02/01/22 ?0631 02/02/22 ?0439  ?WBC 6.2 9.8 8.3  ?NEUTROABS 3.4  --   --   ?HGB 13.1 11.9* 11.3*  ?HCT 42.8 37.6 37.3  ?MCV 97.1 95.2 98.2  ?PLT 152 214 234  ? ?Basic Metabolic Panel: ?Recent Labs  ?Lab 01/31/22 ?1154 02/01/22 ?0631 02/02/22 ?0439  ?NA 141 140 145  ?K 3.9 3.5 3.4*  ?CL 100 103 107  ?CO2 31 29 30   ?GLUCOSE 129* 181* 110*  ?BUN 9 10 12   ?CREATININE 0.88 0.73 0.78  ?CALCIUM 8.5* 8.2*  8.3*  ?MG 2.1  --   --   ? ?GFR: ?Estimated Creatinine Clearance: 79.2 mL/min (by C-G formula based on SCr of 0.78 mg/dL). ?Liver Function Tests: ?Recent Labs  ?Lab 01/31/22 ?1154  ?AST 37  ?ALT 18  ?ALKPHOS 68  ?BILITOT 1.3*  ?PROT 6.8  ?ALBUMIN 3.5  ? ?No results for input(s): LIPASE, AMYLASE in the last 168 hours. ?No results for input(s): AMMONIA in the last 168 hours. ?Coagulation Profile: ?Recent Labs  ?Lab 01/31/22 ?1727  ?INR 1.0  ? ?Cardiac Enzymes: ?No results for input(s): CKTOTAL, CKMB, CKMBINDEX, TROPONINI in the last 168 hours. ?BNP (last 3 results) ?No results for input(s):  PROBNP in the last 8760 hours. ?HbA1C: ?Recent Labs  ?  01/31/22 ?1727  ?HGBA1C 6.8*  ? ?CBG: ?Recent Labs  ?Lab 02/01/22 ?0117 02/01/22 ?0739 02/01/22 ?1157 02/01/22 ?1656 02/01/22 ?2107  ?GLUCAP 168* 160* 165* 89 119*  ? ?Lipid Profile: ?No results for input(s): CHOL, HDL, LDLCALC, TRIG, CHOLHDL, LDLDIRECT in the last 72 hours. ?Thyroid Function Tests: ?No results for input(s): TSH, T4TOTAL, FREET4, T3FREE, THYROIDAB in the last 72 hours. ?Anemia Panel: ?No results for input(s): VITAMINB12, FOLATE, FERRITIN, TIBC, IRON, RETICCTPCT in the last 72 hours. ?Sepsis Labs: ?Recent Labs  ?Lab 01/31/22 ?1153 01/31/22 ?1727 02/01/22 ?1024  ?PROCALCITON  --  <0.10  --   ?LATICACIDVEN 2.0* 2.8* 2.7*  ? ? ?Recent Results (from the past 240 hour(s))  ?Resp Panel by RT-PCR (Flu A&B, Covid) Nasopharyngeal Swab     Status: None  ? Collection Time: 01/31/22 11:53 AM  ? Specimen: Nasopharyngeal Swab; Nasopharyngeal(NP) swabs in vial transport medium  ?Result Value Ref Range Status  ? SARS Coronavirus 2 by RT PCR NEGATIVE NEGATIVE Final  ?  Comment: (NOTE) ?SARS-CoV-2 target nucleic acids are NOT DETECTED. ? ?The SARS-CoV-2 RNA is generally detectable in upper respiratory ?specimens during the acute phase of infection. The lowest ?concentration of SARS-CoV-2 viral copies this assay can detect is ?138 copies/mL. A negative result does not preclude SARS-Cov-2 ?infection and should not be used as the sole basis for treatment or ?other patient management decisions. A negative result may occur with  ?improper specimen collection/handling, submission of specimen other ?than nasopharyngeal swab, presence of viral mutation(s) within the ?areas targeted by this assay, and inadequate number of viral ?copies(<138 copies/mL). A negative result must be combined with ?clinical observations, patient history, and epidemiological ?information. The expected result is Negative. ? ?Fact Sheet for Patients:   ?BloggerCourse.com ? ?Fact Sheet for Healthcare Providers:  ?SeriousBroker.it ? ?This test is no t yet approved or cleared by the [place] FDA and  ?has been authorized for detection and/or diagnosis of SARS-CoV-2 by ?FDA under an Emergency Use Authorization (EUA). This EUA will remain  ?in effect (meaning this test can be used) for the duration of the ?COVID-19 declaration under Section 564(b)(1) of the Act, 21 ?U.S.C.section 360bbb-3(b)(1), unless the authorization is terminated  ?or revoked sooner.  ? ? ?  ? Influenza A by PCR NEGATIVE NEGATIVE Final  ? Influenza B by PCR NEGATIVE NEGATIVE Final  ?  Comment: (NOTE) ?The Xpert Xpress SARS-CoV-2/FLU/RSV plus assay is intended as an aid ?in the diagnosis of influenza from Nasopharyngeal swab specimens and ?should not be used as a sole basis for treatment. Nasal washings and ?aspirates are unacceptable for Xpert Xpress SARS-CoV-2/FLU/RSV ?testing. ? ?Fact Sheet for Patients: ?BloggerCourse.com ? ?Fact Sheet for Healthcare Providers: ?SeriousBroker.it ? ?This test is not yet approved or cleared by the [place]  and ?has been authorized for detection and/or diagnosis of SARS-CoV-2 by ?FDA under an Emergency Use Authorization (EUA). This EUA will remain ?in effect (meaning this test can be used) for the duration of the ?COVID-19 declaration under Section 564(b)(1) of the Act, 21 U.S.C. ?section 360bbb-3(b)(1), unless the authorization is terminated or ?revoked. ? ?Performed at [facility], [address] [place], ?[place] [address] ?  ?Blood Culture (routine x 2)     Status: None (Preliminary result)  ? Collection Time: 01/31/22 11:54 AM  ? Specimen: BLOOD  ?Result Value Ref Range Status  ? Specimen Description BLOOD RIGHT ANTECUBITAL  Final  ? Special Requests   Final  ?  BOTTLES DRAWN AEROBIC AND ANAEROBIC Blood Culture results may not be optimal  due to an inadequate volume of blood received in culture bottles  ? Culture   Final  ?  NO GROWTH 2 DAYS ?Performed at [facility], [address] [place], [place] [address] ?  ? Report Status PENDING  Incomplete  ?Blood Culture (rout

## 2022-02-03 DIAGNOSIS — J189 Pneumonia, unspecified organism: Secondary | ICD-10-CM | POA: Diagnosis not present

## 2022-02-03 DIAGNOSIS — Z6841 Body Mass Index (BMI) 40.0 and over, adult: Secondary | ICD-10-CM

## 2022-02-03 DIAGNOSIS — J9601 Acute respiratory failure with hypoxia: Secondary | ICD-10-CM | POA: Diagnosis not present

## 2022-02-03 LAB — CBC
HCT: 35.9 % — ABNORMAL LOW (ref 36.0–46.0)
Hemoglobin: 10.8 g/dL — ABNORMAL LOW (ref 12.0–15.0)
MCH: 29.4 pg (ref 26.0–34.0)
MCHC: 30.1 g/dL (ref 30.0–36.0)
MCV: 97.8 fL (ref 80.0–100.0)
Platelets: 221 10*3/uL (ref 150–400)
RBC: 3.67 MIL/uL — ABNORMAL LOW (ref 3.87–5.11)
RDW: 17.2 % — ABNORMAL HIGH (ref 11.5–15.5)
WBC: 6.6 10*3/uL (ref 4.0–10.5)
nRBC: 0 % (ref 0.0–0.2)

## 2022-02-03 LAB — BASIC METABOLIC PANEL
Anion gap: 6 (ref 5–15)
BUN: 11 mg/dL (ref 8–23)
CO2: 31 mmol/L (ref 22–32)
Calcium: 8.1 mg/dL — ABNORMAL LOW (ref 8.9–10.3)
Chloride: 106 mmol/L (ref 98–111)
Creatinine, Ser: 0.74 mg/dL (ref 0.44–1.00)
GFR, Estimated: >60 mL/min (ref >60)
Glucose, Bld: 96 mg/dL (ref 70–99)
Potassium: 3.6 mmol/L (ref 3.5–5.1)
Sodium: 143 mmol/L (ref 135–145)

## 2022-02-03 LAB — GLUCOSE, CAPILLARY
Glucose-Capillary: 86 mg/dL (ref 70–99)
Glucose-Capillary: 98 mg/dL (ref 70–99)
Glucose-Capillary: 104 mg/dL — ABNORMAL HIGH (ref 70–99)
Glucose-Capillary: 109 mg/dL — ABNORMAL HIGH (ref 70–99)

## 2022-02-03 NOTE — Progress Notes (Signed)
?PROGRESS NOTE ? ? ? ?Ariel Alvarez  ZOX:[id number] DOB: 10/14/1946 DOA: 01/31/2022 ?PCP: Enid Baas, MD  ? ?Assessment & Plan: ?  ?Principal Problem: ?  CAP (community acquired pneumonia) ?Active Problems: ?  Acute respiratory failure with hypoxia (HCC) ?  Collagen vascular disease (HCC) ?  Other pulmonary embolism without acute cor pulmonale (HCC) ?  Morbid obesity with BMI of 40.0-44.9, adult (HCC) ?  Hypothyroidism ?  Depressive disorder ?  Diabetes (HCC) ?  Weakness of both lower extremities ?  Lactic acidosis ? ? ?CAP: continue on IV ceftriaxone, azithromycin, bronchodilators & encourage incentive spirometry. Continue on supplemental oxygen and wean as tolerated, currently on 2L [place]  ? ?Acute hypoxic respiratory failure: likely secondary to underlying community-acquired pneumonia. Upon arrival to the ER she had room air pulse oximetry of 87%. Continue on supplemental oxygen and wean as tolerated, currently on 2L [place]  ? ?Hypokalemia: WNL  ?  ?Lactic acidosis: resolved  ?  ?Weakness of both lower extremities: PT/OT recs SNF ?  ?DM2: HbA1c 6.8, pretty well controlled. Continue on SSI w/ accuchecks  ?  ?Depressive disorder: severity unknown. Continue on home dose of seroquel, effexor  ?  ?Hypothyroidism: continue on home dose of levothyroxine  ?  ?Morbid obesity: BMI 47.4. Complicates overall care & prognosis  ?  ?Pulmonary embolism: continue on eliquis  ?  ?Collagen vascular disease: continue on home dose of plaquenil  ? ? ? ?DVT prophylaxis: eliquis ?Code Status: full  ?Family Communication: discussed pt's care w/ pt's husband, Molly Maduro, & answered his questions ?Disposition Plan: likely d/c to SNF ? ?Level of care: Med-Surg ? ?Status is: Inpatient ?Remains inpatient appropriate because: severity of illness, as stated above ? ? ? ?Consultants:  ? ? ?Procedures:  ? ?Antimicrobials: rocephin, azithromycin  ? ? ?Subjective: ?Pt c/o fatigue  ? ?Objective: ?Vitals:  ? 02/02/22 0826 02/02/22 1701 02/02/22 2101  02/03/22 0539  ?BP: (!) 110/46 (!) 106/58 129/63 (!) 101/42  ?Pulse: 70 80 73 73  ?Resp: ?Temp: (!) 97.5 ?F (36.4 ?C) 97.9 ?F (36.6 ?C) 97.6 ?F (36.4 ?C) 97.6 ?F (36.4 ?C)  ?TempSrc:      ?SpO2: 93% 96% 99% 100%  ?Weight:      ?Height:      ? ? ?Intake/Output Summary (Last 24 hours) at 02/03/2022 0753 ?Last data filed at 02/02/2022 1300 ?Gross per 24 hour  ?Intake 240 ml  ?Output 150 ml  ?Net 90 ml  ? ?Filed Weights  ? 01/31/22 1147  ?Weight: 121.6 kg  ? ? ?Examination: ? ?General exam: Appears calm & comfortable. Morbid obesity  ?Respiratory system: decreased breath sounds b/l. No wheezes ?Cardiovascular system: S1 & S2+. No rubs or gallops  ?Gastrointestinal system: Abd is soft, NT, obese & hypoactive bowel sounds  ?Central nervous system: alert and oriented. Moves all extremities  ?Psychiatry: judgement and insight appears at baseline. Flat mood and affect  ? ? ? ?Data Reviewed: I have personally reviewed following labs and imaging studies ? ?CBC: ?Recent Labs  ?Lab 01/31/22 ?1154 02/01/22 ?0631 02/02/22 ?0439 02/03/22 ?0500  ?WBC 6.2 9.8 8.3 6.6  ?NEUTROABS 3.4  --   --   --   ?HGB 13.1 11.9* 11.3* 10.8*  ?HCT 42.8 37.6 37.3 35.9*  ?MCV 97.1 95.2 98.2 97.8  ?PLT 152 214 234 221  ? ?Basic Metabolic Panel: ?Recent Labs  ?Lab 01/31/22 ?1154 02/01/22 ?0631 02/02/22 ?0439 02/03/22 ?0500  ?NA 141 140 145 143  ?K 3.9 3.5 3.4*  3.6  ?CL 100 103 107 106  ?CO2 ?GLUCOSE 129* 181* 110* 96  ?BUN ?CREATININE 0.88 0.73 0.78 0.74  ?CALCIUM 8.5* 8.2* 8.3* 8.1*  ?MG 2.1  --   --   --   ? ?GFR: ?Estimated Creatinine Clearance: 79.2 mL/min (by C-G formula based on SCr of 0.74 mg/dL). ?Liver Function Tests: ?Recent Labs  ?Lab 01/31/22 ?1154  ?AST 37  ?ALT 18  ?ALKPHOS 68  ?BILITOT 1.3*  ?PROT 6.8  ?ALBUMIN 3.5  ? ?No results for input(s): LIPASE, AMYLASE in the last 168 hours. ?No results for input(s): AMMONIA in the last 168 hours. ?Coagulation Profile: ?Recent Labs  ?Lab 01/31/22 ?1727  ?INR 1.0   ? ?Cardiac Enzymes: ?No results for input(s): CKTOTAL, CKMB, CKMBINDEX, TROPONINI in the last 168 hours. ?BNP (last 3 results) ?No results for input(s): PROBNP in the last 8760 hours. ?HbA1C: ?Recent Labs  ?  01/31/22 ?1727  ?HGBA1C 6.8*  ? ?CBG: ?Recent Labs  ?Lab 02/02/22 ?0756 02/02/22 ?1257 02/02/22 ?1738 02/02/22 ?2124 02/03/22 ?0745  ?GLUCAP 95 137* 122* 126* 86  ? ?Lipid Profile: ?No results for input(s): CHOL, HDL, LDLCALC, TRIG, CHOLHDL, LDLDIRECT in the last 72 hours. ?Thyroid Function Tests: ?No results for input(s): TSH, T4TOTAL, FREET4, T3FREE, THYROIDAB in the last 72 hours. ?Anemia Panel: ?No results for input(s): VITAMINB12, FOLATE, FERRITIN, TIBC, IRON, RETICCTPCT in the last 72 hours. ?Sepsis Labs: ?Recent Labs  ?Lab 01/31/22 ?1153 01/31/22 ?1727 02/01/22 ?1024 02/02/22 ?1108  ?PROCALCITON  --  <0.10  --   --   ?LATICACIDVEN 2.0* 2.8* 2.7* 0.8  ? ? ?Recent Results (from the past 240 hour(s))  ?Resp Panel by RT-PCR (Flu A&B, Covid) Nasopharyngeal Swab     Status: None  ? Collection Time: 01/31/22 11:53 AM  ? Specimen: Nasopharyngeal Swab; Nasopharyngeal(NP) swabs in vial transport medium  ?Result Value Ref Range Status  ? SARS Coronavirus 2 by RT PCR NEGATIVE NEGATIVE Final  ?  Comment: (NOTE) ?SARS-CoV-2 target nucleic acids are NOT DETECTED. ? ?The SARS-CoV-2 RNA is generally detectable in upper respiratory ?specimens during the acute phase of infection. The lowest ?concentration of SARS-CoV-2 viral copies this assay can detect is ?138 copies/mL. A negative result does not preclude SARS-Cov-2 ?infection and should not be used as the sole basis for treatment or ?other patient management decisions. A negative result may occur with  ?improper specimen collection/handling, submission of specimen other ?than nasopharyngeal swab, presence of viral mutation(s) within the ?areas targeted by this assay, and inadequate number of viral ?copies(<138 copies/mL). A negative result must be combined  with ?clinical observations, patient history, and epidemiological ?information. The expected result is Negative. ? ?Fact Sheet for Patients:  ?BloggerCourse.com ? ?Fact Sheet for Healthcare Providers:  ?SeriousBroker.it ? ?This test is no t yet approved or cleared by the [place] FDA and  ?has been authorized for detection and/or diagnosis of SARS-CoV-2 by ?FDA under an Emergency Use Authorization (EUA). This EUA will remain  ?in effect (meaning this test can be used) for the duration of the ?COVID-19 declaration under Section 564(b)(1) of the Act, 21 ?U.S.C.section 360bbb-3(b)(1), unless the authorization is terminated  ?or revoked sooner.  ? ? ?  ? Influenza A by PCR NEGATIVE NEGATIVE Final  ? Influenza B by PCR NEGATIVE NEGATIVE Final  ?  Comment: (NOTE) ?The Xpert Xpress SARS-CoV-2/FLU/RSV plus assay is intended as an aid ?in the diagnosis of influenza from Nasopharyngeal swab specimens and ?should not be  used as a sole basis for treatment. Nasal washings and ?aspirates are unacceptable for Xpert Xpress SARS-CoV-2/FLU/RSV ?testing. ? ?Fact Sheet for Patients: ?BloggerCourse.comhttps://www.fda.gov/media/152166/download ? ?Fact Sheet for Healthcare Providers: ?SeriousBroker.ithttps://www.fda.gov/media/152162/download ? ?This test is not yet approved or cleared by the [place] FDA and ?has been authorized for detection and/or diagnosis of SARS-CoV-2 by ?FDA under an Emergency Use Authorization (EUA). This EUA will remain ?in effect (meaning this test can be used) for the duration of the ?COVID-19 declaration under Section 564(b)(1) of the Act, 21 U.S.C. ?section 360bbb-3(b)(1), unless the authorization is terminated or ?revoked. ? ?Performed at [facility], [address] [place], ?[place] [id number] ?  ?Blood Culture (routine x 2)     Status: None (Preliminary result)  ? Collection Time: 01/31/22 11:54 AM  ? Specimen: BLOOD  ?Result Value Ref Range Status  ? Specimen  Description BLOOD RIGHT ANTECUBITAL  Final  ? Special Requests   Final  ?  BOTTLES DRAWN AEROBIC AND ANAEROBIC Blood Culture results may not be optimal due to an inadequate volume of blood received in culture bottles  ? Cultur

## 2022-02-03 NOTE — Plan of Care (Signed)
?  Problem: Activity: ?Goal: Ability to tolerate increased activity will improve ?Outcome: Progressing ?  ?Problem: Clinical Measurements: ?Goal: Ability to maintain a body temperature in the normal range will improve ?Outcome: Progressing ?  ?Problem: Respiratory: ?Goal: Ability to maintain adequate ventilation will improve ?Outcome: Progressing ?Goal: Ability to maintain a clear airway will improve ?Outcome: Progressing ?  ?Problem: Health Behavior/Discharge Planning: ?Goal: Ability to manage health-related needs will improve ?Outcome: Progressing ?  ?Problem: Clinical Measurements: ?Goal: Will remain free from infection ?Outcome: Progressing ?Goal: Diagnostic test results will improve ?Outcome: Progressing ?Goal: Respiratory complications will improve ?Outcome: Progressing ?  ?

## 2022-02-04 DIAGNOSIS — J189 Pneumonia, unspecified organism: Secondary | ICD-10-CM | POA: Diagnosis not present

## 2022-02-04 DIAGNOSIS — J9601 Acute respiratory failure with hypoxia: Secondary | ICD-10-CM | POA: Diagnosis not present

## 2022-02-04 DIAGNOSIS — Z6841 Body Mass Index (BMI) 40.0 and over, adult: Secondary | ICD-10-CM | POA: Diagnosis not present

## 2022-02-04 LAB — GLUCOSE, CAPILLARY
Glucose-Capillary: 92 mg/dL (ref 70–99)
Glucose-Capillary: 100 mg/dL — ABNORMAL HIGH (ref 70–99)
Glucose-Capillary: 114 mg/dL — ABNORMAL HIGH (ref 70–99)
Glucose-Capillary: 89 mg/dL (ref 70–99)

## 2022-02-04 LAB — BASIC METABOLIC PANEL
Anion gap: 4 — ABNORMAL LOW (ref 5–15)
BUN: 11 mg/dL (ref 8–23)
CO2: 34 mmol/L — ABNORMAL HIGH (ref 22–32)
Calcium: 8.2 mg/dL — ABNORMAL LOW (ref 8.9–10.3)
Chloride: 105 mmol/L (ref 98–111)
Creatinine, Ser: 0.68 mg/dL (ref 0.44–1.00)
GFR, Estimated: >60 mL/min (ref >60)
Glucose, Bld: 96 mg/dL (ref 70–99)
Potassium: 3.3 mmol/L — ABNORMAL LOW (ref 3.5–5.1)
Sodium: 143 mmol/L (ref 135–145)

## 2022-02-04 LAB — CBC
HCT: 35.3 % — ABNORMAL LOW (ref 36.0–46.0)
Hemoglobin: 10.8 g/dL — ABNORMAL LOW (ref 12.0–15.0)
MCH: 29.9 pg (ref 26.0–34.0)
MCHC: 30.6 g/dL (ref 30.0–36.0)
MCV: 97.8 fL (ref 80.0–100.0)
Platelets: 227 10*3/uL (ref 150–400)
RBC: 3.61 MIL/uL — ABNORMAL LOW (ref 3.87–5.11)
RDW: 16.3 % — ABNORMAL HIGH (ref 11.5–15.5)
WBC: 7 10*3/uL (ref 4.0–10.5)
nRBC: 0 % (ref 0.0–0.2)

## 2022-02-04 LAB — URINE CULTURE: Culture: >=100000 — AB

## 2022-02-04 MED ORDER — POTASSIUM CHLORIDE CRYS ER 20 MEQ PO TBCR
20.0000 meq | EXTENDED_RELEASE_TABLET | Freq: Once | ORAL | Status: AC
  Administered 2022-02-04: 20 meq via ORAL
  Filled 2022-02-04: qty 1

## 2022-02-04 NOTE — Progress Notes (Signed)
?PROGRESS NOTE ? ? ? ?Ariel Alvarez  [id number] DOB: July 01, 1946 DOA: 01/31/2022 ?PCP: Gladstone Lighter, MD  ? ?Assessment & Plan: ?  ?Principal Problem: ?  CAP (community acquired pneumonia) ?Active Problems: ?  Acute respiratory failure with hypoxia ([place]) ?  Collagen vascular disease ([place]) ?  Other pulmonary embolism without acute cor pulmonale (HCC) ?  Morbid obesity with BMI of 40.0-44.9, adult ([place]) ?  Hypothyroidism ?  Depressive disorder ?  Diabetes ([place]) ?  Weakness of both lower extremities ?  Lactic acidosis ? ? ?CAP: completed abx course. Continue on bronchodilators & encourage incentive spirometry. Continue on supplemental oxygen and wean as tolerated, currently on 2L [place]  ? ?Acute hypoxic respiratory failure: likely secondary to underlying community-acquired pneumonia. Upon arrival to the ER she had room air pulse oximetry of 87%. Continue on supplemental oxygen and wean as tolerated, still on 2L [place]  ? ?Hypokalemia: potassium given  ?  ?Lactic acidosis: resolved  ?  ?Weakness of both lower extremities: therapy recs SNF  ?  ?DM2: pretty well controlled, HbA1c 6.8. Continue on SSI w/ accuchecks ? ?Peripheral neuropathy: continue on home dose of gabapentin  ?  ?Depressive disorder: severity unknown. Continue on venlafaxine, seroquel  ?  ?Hypothyroidism: continue on home dose of synthroid  ?  ?Morbid obesity: BMI 47.4. Complicates overall care & prognosis  ?  ?Pulmonary embolism: continue on eliquis  ?  ?Collagen vascular disease: continue on home dose of plaquenil  ? ? ? ?DVT prophylaxis: eliquis ?Code Status: full  ?Family Communication:  ?Disposition Plan: likely d/c to SNF ? ?Level of care: Med-Surg ? ?Status is: Inpatient ?Remains inpatient appropriate because: severity of illness, as stated above ? ? ? ?Consultants:  ? ? ?Procedures:  ? ?Antimicrobials:  ? ? ?Subjective: ?Pt c/o feet pain  ? ?Objective: ?Vitals:  ? 02/03/22 1551 02/03/22 1801 02/03/22 2057 02/04/22 [id number]  ?BP: 121/63 123/62 132/70  127/64  ?Pulse: 73 74 70 64  ?Resp: 16 18 19  (!) 21  ?Temp: (!) 97.5 ?F (36.4 ?C) (!) 97.5 ?F (36.4 ?C) 97.9 ?F (36.6 ?C) 97.6 ?F (36.4 ?C)  ?TempSrc:  Oral Oral   ?SpO2: 96% 91% 96% 97%  ?Weight:      ?Height:      ? ? ?Intake/Output Summary (Last 24 hours) at 02/04/2022 0733 ?Last data filed at 02/03/2022 2224 ?Gross per 24 hour  ?Intake 780 ml  ?Output --  ?Net 780 ml  ? ?Filed Weights  ? 01/31/22 1147  ?Weight: 121.6 kg  ? ? ?Examination: ? ?General exam: Appears calm but uncomfortable. Morbidly obese ?Respiratory system: diminished breath sounds b/l  ?Cardiovascular system: S1/S2+. No rubs or gallops  ?Gastrointestinal system: Abd is soft, NT, obese & hypoactive bowel sounds  ?Central nervous system: Alert and oriented. Moves all extremities  ?Psychiatry: judgement and insight is at baseline. Flat mood and affect  ? ? ? ?Data Reviewed: I have personally reviewed following labs and imaging studies ? ?CBC: ?Recent Labs  ?Lab 01/31/22 ?1154 02/01/22 ?0631 02/02/22 ?0439 02/03/22 ?0500 02/04/22 ?0449  ?WBC 6.2 9.8 8.3 6.6 7.0  ?NEUTROABS 3.4  --   --   --   --   ?HGB 13.1 11.9* 11.3* 10.8* 10.8*  ?HCT 42.8 37.6 37.3 35.9* 35.3*  ?MCV 97.1 95.2 98.2 97.8 97.8  ?PLT 152 214 234 221 227  ? ?Basic Metabolic Panel: ?Recent Labs  ?Lab 01/31/22 ?1154 02/01/22 ?0631 02/02/22 ?0439 02/03/22 ?0500 02/04/22 ?0449  ?NA 141 140 145 143 143  ?  K 3.9 3.5 3.4* 3.6 3.3*  ?CL 100 103 107 106 105  ?CO2 31 29 30 31  34*  ?GLUCOSE 129* 181* 110* 96 96  ?BUN 9 10 12 11 11   ?CREATININE 0.88 0.73 0.78 0.74 0.68  ?CALCIUM 8.5* 8.2* 8.3* 8.1* 8.2*  ?MG 2.1  --   --   --   --   ? ?GFR: ?Estimated Creatinine Clearance: 79.2 mL/min (by C-G formula based on SCr of 0.68 mg/dL). ?Liver Function Tests: ?Recent Labs  ?Lab 01/31/22 ?1154  ?AST 37  ?ALT 18  ?ALKPHOS 68  ?BILITOT 1.3*  ?PROT 6.8  ?ALBUMIN 3.5  ? ?No results for input(s): LIPASE, AMYLASE in the last 168 hours. ?No results for input(s): AMMONIA in the last 168 hours. ?Coagulation  Profile: ?Recent Labs  ?Lab 01/31/22 ?1727  ?INR 1.0  ? ?Cardiac Enzymes: ?No results for input(s): CKTOTAL, CKMB, CKMBINDEX, TROPONINI in the last 168 hours. ?BNP (last 3 results) ?No results for input(s): PROBNP in the last 8760 hours. ?HbA1C: ?No results for input(s): HGBA1C in the last 72 hours. ? ?CBG: ?Recent Labs  ?Lab 02/02/22 ?2124 02/03/22 ?0745 02/03/22 ?1228 02/03/22 ?1656 02/03/22 ?2200  ?GLUCAP 126* 86 98 104* 109*  ? ?Lipid Profile: ?No results for input(s): CHOL, HDL, LDLCALC, TRIG, CHOLHDL, LDLDIRECT in the last 72 hours. ?Thyroid Function Tests: ?No results for input(s): TSH, T4TOTAL, FREET4, T3FREE, THYROIDAB in the last 72 hours. ?Anemia Panel: ?No results for input(s): VITAMINB12, FOLATE, FERRITIN, TIBC, IRON, RETICCTPCT in the last 72 hours. ?Sepsis Labs: ?Recent Labs  ?Lab 01/31/22 ?1153 01/31/22 ?1727 02/01/22 ?1024 02/02/22 ?1108  ?PROCALCITON  --  <0.10  --   --   ?LATICACIDVEN 2.0* 2.8* 2.7* 0.8  ? ? ?Recent Results (from the past 240 hour(s))  ?Resp Panel by RT-PCR (Flu A&B, Covid) Nasopharyngeal Swab     Status: None  ? Collection Time: 01/31/22 11:53 AM  ? Specimen: Nasopharyngeal Swab; Nasopharyngeal(NP) swabs in vial transport medium  ?Result Value Ref Range Status  ? SARS Coronavirus 2 by RT PCR NEGATIVE NEGATIVE Final  ?  Comment: (NOTE) ?SARS-CoV-2 target nucleic acids are NOT DETECTED. ? ?The SARS-CoV-2 RNA is generally detectable in upper respiratory ?specimens during the acute phase of infection. The lowest ?concentration of SARS-CoV-2 viral copies this assay can detect is ?138 copies/mL. A negative result does not preclude SARS-Cov-2 ?infection and should not be used as the sole basis for treatment or ?other patient management decisions. A negative result may occur with  ?improper specimen collection/handling, submission of specimen other ?than nasopharyngeal swab, presence of viral mutation(s) within the ?areas targeted by this assay, and inadequate number of  viral ?copies(<138 copies/mL). A negative result must be combined with ?clinical observations, patient history, and epidemiological ?information. The expected result is Negative. ? ?Fact Sheet for Patients:  ?EntrepreneurPulse.com.au ? ?Fact Sheet for Healthcare Providers:  ?IncredibleEmployment.be ? ?This test is no t yet approved or cleared by the [place] FDA and  ?has been authorized for detection and/or diagnosis of SARS-CoV-2 by ?FDA under an Emergency Use Authorization (EUA). This EUA will remain  ?in effect (meaning this test can be used) for the duration of the ?COVID-19 declaration under Section 564(b)(1) of the Act, 21 ?U.S.C.section 360bbb-3(b)(1), unless the authorization is terminated  ?or revoked sooner.  ? ? ?  ? Influenza A by PCR NEGATIVE NEGATIVE Final  ? Influenza B by PCR NEGATIVE NEGATIVE Final  ?  Comment: (NOTE) ?The Xpert Xpress SARS-CoV-2/FLU/RSV plus assay is intended as an aid ?  in the diagnosis of influenza from Nasopharyngeal swab specimens and ?should not be used as a sole basis for treatment. Nasal washings and ?aspirates are unacceptable for Xpert Xpress SARS-CoV-2/FLU/RSV ?testing. ? ?Fact Sheet for Patients: ?EntrepreneurPulse.com.au ? ?Fact Sheet for Healthcare Providers: ?IncredibleEmployment.be ? ?This test is not yet approved or cleared by the [place] FDA and ?has been authorized for detection and/or diagnosis of SARS-CoV-2 by ?FDA under an Emergency Use Authorization (EUA). This EUA will remain ?in effect (meaning this test can be used) for the duration of the ?COVID-19 declaration under Section 564(b)(1) of the Act, 21 U.S.C. ?section 360bbb-3(b)(1), unless the authorization is terminated or ?revoked. ? ?Performed at [facility], [place], ?[place] [address] ?  ?Blood Culture (routine x 2)     Status: None (Preliminary result)  ? Collection Time: 01/31/22 11:54 AM  ?  Specimen: BLOOD  ?Result Value Ref Range Status  ? Specimen Description BLOOD RIGHT ANTECUBITAL  Final  ? Special Requests   Final  ?  BOTTLES DRAWN AEROBIC AND ANAEROBIC Blood Culture results may not be optimal due to an inadequate

## 2022-02-04 NOTE — TOC Progression Note (Signed)
Transition of Care (TOC) - Progression Note  ? ? ?Patient Details  ?Name: Ariel OremShelby J Alvarez ?MRN: [id number] ?Date of Birth: Jul 09, 1946 ? ?Transition of Care (TOC) CM/SW Contact  ?Bing QuarryBarbara B Sivan Cuello, RN ?Phone Number: ?02/04/2022, 12:03 PM ? ?Clinical Narrative: 4/9: Insurance Alert in Epic: [employer] PPO not HMO? Did not show up as managed by [facility]. Called [facility] and received ins auth iD. Faxed docs to fax number and left CSW Weekday number for call back if tomorrow, if not today. [facility] auth ID #[id number] ?Gabriel CirriBarbie Zita Ozimek RN CM [phone] ?  ? ? ? ?Expected Discharge Plan: Skilled Nursing Facility ?Barriers to Discharge: Continued Medical Work up, English as a second language teachernsurance Authorization, SNF Pending bed offer ? ?Expected Discharge Plan and Services ?Expected Discharge Plan: Skilled Nursing Facility ?  ?Discharge Planning Services: CM Consult ?  ?Living arrangements for the past 2 months: Single Family Home ?                ?  ?  ?  ?  ?  ?  ?  ?  ?  ?  ? ? ?Social Determinants of Health (SDOH) Interventions ?  ? ?Readmission Risk Interventions ?   ? View : No data to display.  ?  ?  ?  ? ? ?

## 2022-02-04 NOTE — Plan of Care (Signed)
?  Problem: Respiratory: ?Goal: Ability to maintain adequate ventilation will improve ?Outcome: Progressing ?  ?Problem: Respiratory: ?Goal: Ability to maintain a clear airway will improve ?Outcome: Progressing ?  ?Problem: Health Behavior/Discharge Planning: ?Goal: Ability to manage health-related needs will improve ?Outcome: Progressing ?  ?Problem: Clinical Measurements: ?Goal: Diagnostic test results will improve ?Outcome: Progressing ?  ?Problem: Clinical Measurements: ?Goal: Respiratory complications will improve ?Outcome: Progressing ?  ?

## 2022-02-05 DIAGNOSIS — J9601 Acute respiratory failure with hypoxia: Secondary | ICD-10-CM | POA: Diagnosis not present

## 2022-02-05 DIAGNOSIS — Z6841 Body Mass Index (BMI) 40.0 and over, adult: Secondary | ICD-10-CM | POA: Diagnosis not present

## 2022-02-05 DIAGNOSIS — J189 Pneumonia, unspecified organism: Secondary | ICD-10-CM | POA: Diagnosis not present

## 2022-02-05 LAB — GLUCOSE, CAPILLARY
Glucose-Capillary: 89 mg/dL (ref 70–99)
Glucose-Capillary: 122 mg/dL — ABNORMAL HIGH (ref 70–99)
Glucose-Capillary: 106 mg/dL — ABNORMAL HIGH (ref 70–99)
Glucose-Capillary: 107 mg/dL — ABNORMAL HIGH (ref 70–99)

## 2022-02-05 LAB — BASIC METABOLIC PANEL
Anion gap: 5 (ref 5–15)
BUN: 8 mg/dL (ref 8–23)
CO2: 36 mmol/L — ABNORMAL HIGH (ref 22–32)
Calcium: 8.4 mg/dL — ABNORMAL LOW (ref 8.9–10.3)
Chloride: 102 mmol/L (ref 98–111)
Creatinine, Ser: 0.7 mg/dL (ref 0.44–1.00)
GFR, Estimated: >60 mL/min (ref >60)
Glucose, Bld: 95 mg/dL (ref 70–99)
Potassium: 3.7 mmol/L (ref 3.5–5.1)
Sodium: 143 mmol/L (ref 135–145)

## 2022-02-05 LAB — CBC
HCT: 37 % (ref 36.0–46.0)
Hemoglobin: 11.3 g/dL — ABNORMAL LOW (ref 12.0–15.0)
MCH: 29.7 pg (ref 26.0–34.0)
MCHC: 30.5 g/dL (ref 30.0–36.0)
MCV: 97.4 fL (ref 80.0–100.0)
Platelets: 220 10*3/uL (ref 150–400)
RBC: 3.8 MIL/uL — ABNORMAL LOW (ref 3.87–5.11)
RDW: 16.2 % — ABNORMAL HIGH (ref 11.5–15.5)
WBC: 6.8 10*3/uL (ref 4.0–10.5)
nRBC: 0 % (ref 0.0–0.2)

## 2022-02-05 LAB — CULTURE, BLOOD (ROUTINE X 2)
Culture: NO GROWTH
Culture: NO GROWTH
Special Requests: ADEQUATE

## 2022-02-05 NOTE — Care Management Important Message (Signed)
Important Message ? ?Patient Details  ?Name: Ariel Alvarez ?MRN: [id number] ?Date of Birth: 1946/09/10 ? ? ?Medicare Important Message Given:  Yes ? ? ? ? ?Olegario Messier A Zaidyn Claire ?02/05/2022, 3:15 PM ?

## 2022-02-05 NOTE — Progress Notes (Signed)
Occupational Therapy Treatment ?Patient Details ?Name: Ariel Alvarez ?MRN: [id number] ?DOB: May 16, 1946 ?Today's Date: 02/05/2022 ? ? ?History of present illness Patient is a 76 year old female with 1 week history of productive cough, fevers, diarrhea. Found to have community acquired pneumonia, acute respiratory failure with hypoxia, weakness of bilateral LE. medical history significant for morbid obesity, diabetes mellitus, history of CVA with left upper extremity weakness. ?  ?OT comments ? Upon entering the room, pt supine in bed and agreeable to OT intervention. Pt is pleasant and cooperative this session. Supine >sit with mod A for R LE and trunk support. Use of mirror for visual feedback and midline orientation. Pt able to self correct while seated on EOB with close supervision. Pt combs hair while seated with focus on maintaining seated balance during functional tasks. Pt stands with therapist with mod lifting assistance and min A for standing balance. Pt returns to EOB and is able to laterally scoot towards the R HOB with min- mod A. Sit >supine with mod A for trunk support and B LEs. All needs within reach. Pt making excellent progress towards goals this session with recommendation for SNF at discharge to address occupational performance.   ? ?Recommendations for follow up therapy are one component of a multi-disciplinary discharge planning process, led by the attending physician.  Recommendations may be updated based on patient status, additional functional criteria and insurance authorization. ?   ?Follow Up Recommendations ? Skilled nursing-short term rehab (<3 hours/day)  ?  ?Assistance Recommended at Discharge Frequent or constant Supervision/Assistance  ?Patient can return home with the following ? A lot of help with bathing/dressing/bathroom;Assistance with cooking/housework;Direct supervision/assist for medications management;Direct supervision/assist for financial management;Assist for  transportation;Help with stairs or ramp for entrance;A lot of help with walking and/or transfers ?  ?Equipment Recommendations ? Other (comment) (defer to next venue of care)  ?  ?   ?Precautions / Restrictions Precautions ?Precautions: Fall ?Restrictions ?Weight Bearing Restrictions: No  ? ? ?  ? ?Mobility Bed Mobility ?Overal bed mobility: Needs Assistance ?Bed Mobility: Supine to Sit, Sit to Supine ?  ?  ?Supine to sit: Mod assist ?Sit to supine: Max assist ?  ?General bed mobility comments: Assist for  BLEs and trunk ?  ? ?Transfers ?Overall transfer level: Needs assistance ?Equipment used: 1 person hand held assist ?  ?Sit to Stand: From elevated surface, Mod assist ?  ?  ?  ?  ?  ?General transfer comment: min A for standing balance once up but mod lifting assist to stand. ?  ?  ?Balance Overall balance assessment: Needs assistance ?Sitting-balance support: Feet supported ?Sitting balance-Leahy Scale: Good ?  ?  ?Standing balance support: Single extremity supported, During functional activity ?Standing balance-Leahy Scale: Fair ?  ?  ?  ?  ?  ?  ?  ?  ?  ?  ?  ?  ?   ? ?ADL either performed or assessed with clinical judgement  ? ?ADL Overall ADL's : Needs assistance/impaired ?  ?  ?Grooming: Wash/dry hands;Wash/dry face;Brushing hair;Sitting;Supervision/safety ?  ?  ?  ?  ?  ?  ?  ?  ?  ?  ?  ?  ?  ?  ?  ?  ?  ?  ? ?Extremity/Trunk Assessment Upper Extremity Assessment ?Upper Extremity Assessment: Generalized weakness ?LUE Deficits / Details: trace movement throught from residual stroke ~ 10 years ago ?  ?  ?  ?  ?  ? ?Vision Patient Visual  Report: No change from baseline ?  ?  ?   ?   ? ?Cognition Arousal/Alertness: Awake/alert ?Behavior During Therapy: [facility] for tasks assessed/performed ?Overall Cognitive Status: Within Functional Limits for tasks assessed ?  ?  ?  ?  ?  ?  ?  ?  ?  ?  ?  ?  ?  ?  ?  ?  ?General Comments: Pt is pleasant overall and follows commands with increased time and cuing. ?  ?  ?   ?    ?   ?   ? ? ?Pertinent Vitals/ Pain       Pain Assessment ?Pain Assessment: No/denies pain ? ?   ?   ? ?Frequency ? Min 2X/week  ? ? ? ? ?  ?Progress Toward Goals ? ?OT Goals(current goals can now be found in the care plan section) ? Progress towards OT goals: Progressing toward goals ? ?Acute Rehab OT Goals ?Patient Stated Goal: to get stronger ?OT Goal Formulation: With patient ?Time For Goal Achievement: 02/15/22 ?Potential to Achieve Goals: Fair  ?Plan Discharge plan remains appropriate;Frequency remains appropriate   ? ?   ?AM-PAC OT "6 Clicks" Daily Activity     ?Outcome Measure ? ? Help from another person eating meals?: A Little ?Help from another person taking care of personal grooming?: A Little ?Help from another person toileting, which includes using toliet, bedpan, or urinal?: A Lot ?Help from another person bathing (including washing, rinsing, drying)?: A Lot ?Help from another person to put on and taking off regular upper body clothing?: A Lot ?Help from another person to put on and taking off regular lower body clothing?: Total ?6 Click Score: 13 ? ?  ?End of Session   ? ?OT Visit Diagnosis: Unsteadiness on feet (R26.81);Muscle weakness (generalized) (M62.81) ?  ?Activity Tolerance Patient limited by fatigue ?  ?Patient Left in bed;with call bell/phone within reach;with bed alarm set ?  ?Nurse Communication Mobility status ?  ? ?   ? ?Time: [id number] ?OT Time Calculation (min): 23 min ? ?Charges: OT General Charges ?$OT Visit: 1 Visit ?OT Treatments ?$Self Care/Home Management : 8-22 mins ?$Therapeutic Activity: 8-22 mins ? ?Darleen Crocker, [place], OTR/L , CBIS ?ascom [phone]  ?02/05/22, 3:46 PM  ?

## 2022-02-05 NOTE — TOC Progression Note (Signed)
Transition of Care (TOC) - Progression Note  ? ? ?Patient Details  ?Name: Ariel Alvarez ?MRN: [id number] ?Date of Birth: Aug 07, 1946 ? ?Transition of Care (TOC) CM/SW Contact  ?[name], [name] ?Phone Number: ?02/05/2022, 11:36 AM ? ?Clinical Narrative:   [name] is regular [employer]. [name] has started [place]. ? ? ? ?Expected Discharge Plan: [place] ?Barriers to Discharge: Continued Medical Work up, Ship broker, SNF Pending bed offer ? ?Expected Discharge Plan and Services ?Expected Discharge Plan: [place] ?  ?Discharge Planning Services: CM Consult ?  ?Living arrangements for the past 2 months: [name] ?                ?  ?  ?  ?  ?  ?  ?  ?  ?  ?  ? ? ?Social Determinants of Health (SDOH) Interventions ?  ? ?Readmission Risk Interventions ?   ? View : No data to display.  ?  ?  ?  ? ? ?

## 2022-02-05 NOTE — Progress Notes (Signed)
?PROGRESS NOTE ? ? ? ?Ariel Alvarez  ZOX:[id number] DOB: 09-10-1946 DOA: 01/31/2022 ?PCP: Enid BaasKalisetti, Radhika, MD  ? ?Assessment & Plan: ?  ?Principal Problem: ?  CAP (community acquired pneumonia) ?Active Problems: ?  Acute respiratory failure with hypoxia (HCC) ?  Collagen vascular disease (HCC) ?  Other pulmonary embolism without acute cor pulmonale (HCC) ?  Morbid obesity with BMI of 40.0-44.9, adult (HCC) ?  Hypothyroidism ?  Depressive disorder ?  Diabetes (HCC) ?  Weakness of both lower extremities ?  Lactic acidosis ? ? ?CAP: completed abx course. Continue on bronchodilators & encourage incentive spirometry. Continue on supplemental oxygen and wean as tolerated ? ?Acute hypoxic respiratory failure: likely secondary to underlying community-acquired pneumonia. Upon arrival to the ER she had room air pulse oximetry of 87%. Continue on supplemental oxygen and wean as tolerated ? ?Hypokalemia: WNL today  ?  ?Lactic acidosis: resolved  ?  ?Weakness of both lower extremities: PT/OT recs SNF  ?  ?DM2: HbA1c 6.8, pretty well controlled. Continue on SSI w/ accuchecks ? ?Peripheral neuropathy: continue on home dose of gabapentin  ?  ?Depressive disorder: severity unknown. Continue on seroquel, venlafaxine  ?  ?Hypothyroidism: continue on levothyroxine  ?  ?Morbid obesity: BMI 47.4. Complicates overall care & prognosis ?  ?Pulmonary embolism: continue on eliquis  ?  ?Collagen vascular disease: continue on home dose of plaquenil  ? ? ? ?DVT prophylaxis: eliquis ?Code Status: full  ?Family Communication: discussed pt's w/ pt's husband, Ariel MaduroRobert, & answered his questions  ?Disposition Plan: likely d/c to SNF ? ?Level of care: Med-Surg ? ?Status is: Inpatient ?Remains inpatient appropriate because: waiting on insurance auth  ? ? ? ?Consultants:  ? ? ?Procedures:  ? ?Antimicrobials:  ? ? ?Subjective: ?Pt c/o fatigue  ? ?Objective: ?Vitals:  ? 02/04/22 1648 02/04/22 1949 02/05/22 0409 02/05/22 0736  ?BP: (!) 120/58 121/61  134/72 (!) 141/68  ?Pulse: 70 68 67 71  ?Resp: 20 18 18 17   ?Temp: 98.4 ?F (36.9 ?C) 97.6 ?F (36.4 ?C) 98.4 ?F (36.9 ?C) 97.8 ?F (36.6 ?C)  ?TempSrc:   Oral   ?SpO2: 98% 95% 97% 97%  ?Weight:      ?Height:      ? ? ?Intake/Output Summary (Last 24 hours) at 02/05/2022 0741 ?Last data filed at 02/05/2022 0414 ?Gross per 24 hour  ?Intake 650.42 ml  ?Output 600 ml  ?Net 50.42 ml  ? ?Filed Weights  ? 01/31/22 1147  ?Weight: 121.6 kg  ? ? ?Examination: ? ?General exam: Appears calm & comfortable. Morbidly obese  ?Respiratory system: decreased breath sounds b/l. ?Cardiovascular system: S1 & S2+. No rubs or clicks  ?Gastrointestinal system: Abd is soft, obese, NT & hypoactive bowel sounds   ?Central nervous system: Alert and oriented. Moves all extremities  ?Psychiatry: judgement and insight is at baseline. Flat mood and affect  ? ? ? ?Data Reviewed: I have personally reviewed following labs and imaging studies ? ?CBC: ?Recent Labs  ?Lab 01/31/22 ?1154 02/01/22 ?0631 02/02/22 ?0439 02/03/22 ?0500 02/04/22 ?0449 02/05/22 ?[id number]  ?WBC 6.2 9.8 8.3 6.6 7.0 6.8  ?NEUTROABS 3.4  --   --   --   --   --   ?HGB 13.1 11.9* 11.3* 10.8* 10.8* 11.3*  ?HCT 42.8 37.6 37.3 35.9* 35.3* 37.0  ?MCV 97.1 95.2 98.2 97.8 97.8 97.4  ?PLT 152 214 234 221 227 220  ? ?Basic Metabolic Panel: ?Recent Labs  ?Lab 01/31/22 ?1154 02/01/22 ?0631 02/02/22 ?0439 02/03/22 ?0500 02/04/22 ?0449 02/05/22 ?  0527  ?NA 141 140 145 143 143 143  ?K 3.9 3.5 3.4* 3.6 3.3* 3.7  ?CL 100 103 107 106 105 102  ?CO2 31 29 30 31  34* 36*  ?GLUCOSE 129* 181* 110* 96 96 95  ?BUN 9 10 12 11 11 8   ?CREATININE 0.88 0.73 0.78 0.74 0.68 0.70  ?CALCIUM 8.5* 8.2* 8.3* 8.1* 8.2* 8.4*  ?MG 2.1  --   --   --   --   --   ? ?GFR: ?Estimated Creatinine Clearance: 79.2 mL/min (by C-G formula based on SCr of 0.7 mg/dL). ?Liver Function Tests: ?Recent Labs  ?Lab 01/31/22 ?1154  ?AST 37  ?ALT 18  ?ALKPHOS 68  ?BILITOT 1.3*  ?PROT 6.8  ?ALBUMIN 3.5  ? ?No results for input(s): LIPASE, AMYLASE in the  last 168 hours. ?No results for input(s): AMMONIA in the last 168 hours. ?Coagulation Profile: ?Recent Labs  ?Lab 01/31/22 ?1727  ?INR 1.0  ? ?Cardiac Enzymes: ?No results for input(s): CKTOTAL, CKMB, CKMBINDEX, TROPONINI in the last 168 hours. ?BNP (last 3 results) ?No results for input(s): PROBNP in the last 8760 hours. ?HbA1C: ?No results for input(s): HGBA1C in the last 72 hours. ? ?CBG: ?Recent Labs  ?Lab 02/04/22 ?1950 02/04/22 ?1231 02/04/22 ?1716 02/04/22 ?2130 02/05/22 ?9326  ?GLUCAP 92 100* 114* 89 89  ? ?Lipid Profile: ?No results for input(s): CHOL, HDL, LDLCALC, TRIG, CHOLHDL, LDLDIRECT in the last 72 hours. ?Thyroid Function Tests: ?No results for input(s): TSH, T4TOTAL, FREET4, T3FREE, THYROIDAB in the last 72 hours. ?Anemia Panel: ?No results for input(s): VITAMINB12, FOLATE, FERRITIN, TIBC, IRON, RETICCTPCT in the last 72 hours. ?Sepsis Labs: ?Recent Labs  ?Lab 01/31/22 ?1153 01/31/22 ?1727 02/01/22 ?1024 02/02/22 ?1108  ?PROCALCITON  --  <0.10  --   --   ?LATICACIDVEN 2.0* 2.8* 2.7* 0.8  ? ? ?Recent Results (from the past 240 hour(s))  ?Resp Panel by RT-PCR (Flu A&B, Covid) Nasopharyngeal Swab     Status: None  ? Collection Time: 01/31/22 11:53 AM  ? Specimen: Nasopharyngeal Swab; Nasopharyngeal(NP) swabs in vial transport medium  ?Result Value Ref Range Status  ? SARS Coronavirus 2 by RT PCR NEGATIVE NEGATIVE Final  ?  Comment: (NOTE) ?SARS-CoV-2 target nucleic acids are NOT DETECTED. ? ?The SARS-CoV-2 RNA is generally detectable in upper respiratory ?specimens during the acute phase of infection. The lowest ?concentration of SARS-CoV-2 viral copies this assay can detect is ?138 copies/mL. A negative result does not preclude SARS-Cov-2 ?infection and should not be used as the sole basis for treatment or ?other patient management decisions. A negative result may occur with  ?improper specimen collection/handling, submission of specimen other ?than nasopharyngeal swab, presence of viral mutation(s)  within the ?areas targeted by this assay, and inadequate number of viral ?copies(<138 copies/mL). A negative result must be combined with ?clinical observations, patient history, and epidemiological ?information. The expected result is Negative. ? ?Fact Sheet for Patients:  ?BloggerCourse.com ? ?Fact Sheet for Healthcare Providers:  ?SeriousBroker.it ? ?This test is no t yet approved or cleared by the [place] FDA and  ?has been authorized for detection and/or diagnosis of SARS-CoV-2 by ?FDA under an Emergency Use Authorization (EUA). This EUA will remain  ?in effect (meaning this test can be used) for the duration of the ?COVID-19 declaration under Section 564(b)(1) of the Act, 21 ?U.S.C.section 360bbb-3(b)(1), unless the authorization is terminated  ?or revoked sooner.  ? ? ?  ? Influenza A by PCR NEGATIVE NEGATIVE Final  ? Influenza B by  PCR NEGATIVE NEGATIVE Final  ?  Comment: (NOTE) ?The Xpert Xpress SARS-CoV-2/FLU/RSV plus assay is intended as an aid ?in the diagnosis of influenza from Nasopharyngeal swab specimens and ?should not be used as a sole basis for treatment. Nasal washings and ?aspirates are unacceptable for Xpert Xpress SARS-CoV-2/FLU/RSV ?testing. ? ?Fact Sheet for Patients: ?BloggerCourse.com ? ?Fact Sheet for Healthcare Providers: ?SeriousBroker.it ? ?This test is not yet approved or cleared by the [place] FDA and ?has been authorized for detection and/or diagnosis of SARS-CoV-2 by ?FDA under an Emergency Use Authorization (EUA). This EUA will remain ?in effect (meaning this test can be used) for the duration of the ?COVID-19 declaration under Section 564(b)(1) of the Act, 21 U.S.C. ?section 360bbb-3(b)(1), unless the authorization is terminated or ?revoked. ? ?Performed at [facility], [address] [place], ?[place] [address] ?  ?Blood Culture (routine x 2)     Status: None   ? Collection Time: 01/31/22 11:54 AM  ? Specimen: BLOOD  ?Result Value Ref Range Status  ? Specimen Description BLOOD RIGHT ANTECUBITAL  Final  ? Special Requests   Final  ?  BOTTLES DRAWN AEROBIC AND AN

## 2022-02-05 NOTE — Progress Notes (Signed)
Physical Therapy Treatment ?Patient Details ?Name: Ariel Alvarez ?MRN: [id number] ?DOB: [birth year] ?Today's Date: 02/05/2022 ? ? ?History of Present Illness Patient is a 76 year old female with 1 week history of productive cough, fevers, diarrhea. Found to have community acquired pneumonia, acute respiratory failure with hypoxia, weakness of bilateral LE. medical history significant for morbid obesity, diabetes mellitus, history of CVA with left upper extremity weakness. ? ?  ?PT Comments  ? ? Pt was pleasant and motivated to participate during the session and made good progress towards goals. Pt required decreased assistance with bed mobility and transfers and was able to amb 2 x 2 feet using sink counter for RUE support.  Of note MD requested O2 trials on room air.  Pt's SpO2 was 93% on 1LO2/min. On room air at rest SpO2 91%, during supine therex 88%, during seated therex 92%, and after ambulation 91%.  Pt's HR WNL throughout with no adverse symptoms noted.  Pt will benefit from PT services in a SNF setting upon discharge to safely address deficits listed in patient problem list for decreased caregiver assistance and eventual return to PLOF. ? ?   ?Recommendations for follow up therapy are one component of a multi-disciplinary discharge planning process, led by the attending physician.  Recommendations may be updated based on patient status, additional functional criteria and insurance authorization. ? ?Follow Up Recommendations ? Skilled nursing-short term rehab (<3 hours/day) ?  ?  ?Assistance Recommended at Discharge Frequent or constant Supervision/Assistance  ?Patient can return home with the following Two people to help with walking and/or transfers;A lot of help with bathing/dressing/bathroom;Direct supervision/assist for medications management;Assist for transportation;Help with stairs or ramp for entrance ?  ?Equipment Recommendations ? None recommended by PT  ?  ?Recommendations for Other Services    ? ? ?  ?Precautions / Restrictions Precautions ?Precautions: Fall ?Restrictions ?Weight Bearing Restrictions: No  ?  ? ?Mobility ? Bed Mobility ?Overal bed mobility: Needs Assistance ?Bed Mobility: Supine to Sit, Sit to Supine ?  ?  ?Supine to sit: Mod assist ?Sit to supine: Mod assist ?  ?General bed mobility comments: Mod A for BLEs and trunk ?  ? ?Transfers ?Overall transfer level: Needs assistance ?Equipment used:  (sink counter) ?Transfers: Sit to/from Stand ?Sit to Stand: Mod assist, From elevated surface, Min assist ?  ?  ?  ?  ?  ?General transfer comment: Min to Mod A to come to standing and heavy cuing for increased trunk flexion ?  ? ?Ambulation/Gait ?Ambulation/Gait assistance: Min guard ?Gait Distance (Feet): 2 Feet x 2 ?Assistive device:  (leaning on sink counter) ?Gait Pattern/deviations: Step-to pattern, Shuffle ?Gait velocity: decreased ?  ?  ?General Gait Details: Pt able to take several small, shuffling steps at the EOB forwards/backwards and side stepping ? ? ?Stairs ?  ?  ?  ?  ?  ? ? ?Wheelchair Mobility ?  ? ?Modified Rankin (Stroke Patients Only) ?  ? ? ?  ?Balance Overall balance assessment: Needs assistance ?Sitting-balance support: Feet supported ?Sitting balance-Leahy Scale: Good ?  ?  ?Standing balance support: Single extremity supported, During functional activity ?Standing balance-Leahy Scale: Fair ?  ?  ?  ?  ?  ?  ?  ?  ?  ?  ?  ?  ?  ? ?  ?Cognition Arousal/Alertness: Awake/alert ?Behavior During Therapy: [facility] for tasks assessed/performed ?Overall Cognitive Status: Within Functional Limits for tasks assessed ?  ?  ?  ?  ?  ?  ?  ?  ?  ?  ?  ?  ?  ?  ?  ?  ?  ?  ?  ? ?  ?  Exercises Total Joint Exercises ?Ankle Circles/Pumps: AROM, Strengthening, Both, 10 reps ?Quad Sets: Strengthening, Both, 10 reps ?Gluteal Sets: Strengthening, Both, 10 reps ?Hip ABduction/ADduction: AAROM, Strengthening, Both, 10 reps ?Straight Leg Raises: AAROM, Strengthening, Both, 10 reps ?Long Arc Quad: AROM,  Strengthening, Both, 10 reps ?Knee Flexion: AROM, Strengthening, Both, 10 reps ? ?  ?General Comments   ?  ?  ? ?Pertinent Vitals/Pain Pain Assessment ?Pain Assessment: 0-10 ?Pain Score: 8  ?Pain Location: bil feet ?Pain Descriptors / Indicators: Sore ?Pain Intervention(s): Repositioned, Monitored during session, Other (comment) (pt stated she did not require pain medication)  ? ? ?Home Living   ?  ?  ?  ?  ?  ?  ?  ?  ?  ?   ?  ?Prior Function    ?  ?  ?   ? ?PT Goals (current goals can now be found in the care plan section) Progress towards PT goals: Progressing toward goals ? ?  ?Frequency ? ? ? Min 2X/week ? ? ? ?  ?PT Plan Current plan remains appropriate  ? ? ?Co-evaluation   ?  ?  ?  ?  ? ?  ?AM-PAC PT "6 Clicks" Mobility   ?Outcome Measure ? Help needed turning from your back to your side while in a flat bed without using bedrails?: A Lot ?Help needed moving from lying on your back to sitting on the side of a flat bed without using bedrails?: A Lot ?Help needed moving to and from a bed to a chair (including a wheelchair)?: Total ?Help needed standing up from a chair using your arms (e.g., wheelchair or bedside chair)?: A Lot ?Help needed to walk in hospital room?: Total ?Help needed climbing 3-5 steps with a railing? : Total ?6 Click Score: 9 ? ?  ?End of Session Equipment Utilized During Treatment: Gait belt ?Activity Tolerance: Patient tolerated treatment well ?Patient left: in bed;with call bell/phone within reach;with bed alarm set;with nursing/sitter in room ?Nurse Communication: Mobility status;Other (comment) (SpO2 results on room air) ?PT Visit Diagnosis: Unsteadiness on feet (R26.81);Muscle weakness (generalized) (M62.81) ?  ? ? ?Time: 1359-1430 ?PT Time Calculation (min) (ACUTE ONLY): 31 min ? ?Charges:  $Therapeutic Exercise: 8-22 mins ?$Therapeutic Activity: 8-22 mins          ?          ? ?D. Elly Modena PT, DPT ?02/05/22, 3:27 PM ? ? ?

## 2022-02-06 DIAGNOSIS — M6281 Muscle weakness (generalized): Secondary | ICD-10-CM | POA: Diagnosis not present

## 2022-02-06 DIAGNOSIS — R5381 Other malaise: Secondary | ICD-10-CM | POA: Diagnosis not present

## 2022-02-06 DIAGNOSIS — J9600 Acute respiratory failure, unspecified whether with hypoxia or hypercapnia: Secondary | ICD-10-CM | POA: Diagnosis not present

## 2022-02-06 DIAGNOSIS — E1140 Type 2 diabetes mellitus with diabetic neuropathy, unspecified: Secondary | ICD-10-CM | POA: Diagnosis not present

## 2022-02-06 DIAGNOSIS — N390 Urinary tract infection, site not specified: Secondary | ICD-10-CM | POA: Diagnosis not present

## 2022-02-06 DIAGNOSIS — J189 Pneumonia, unspecified organism: Secondary | ICD-10-CM | POA: Diagnosis not present

## 2022-02-06 DIAGNOSIS — Z86711 Personal history of pulmonary embolism: Secondary | ICD-10-CM | POA: Diagnosis not present

## 2022-02-06 DIAGNOSIS — J9601 Acute respiratory failure with hypoxia: Secondary | ICD-10-CM | POA: Diagnosis not present

## 2022-02-06 DIAGNOSIS — R0602 Shortness of breath: Secondary | ICD-10-CM | POA: Diagnosis not present

## 2022-02-06 DIAGNOSIS — R278 Other lack of coordination: Secondary | ICD-10-CM | POA: Diagnosis not present

## 2022-02-06 DIAGNOSIS — R2689 Other abnormalities of gait and mobility: Secondary | ICD-10-CM | POA: Diagnosis not present

## 2022-02-06 DIAGNOSIS — R300 Dysuria: Secondary | ICD-10-CM | POA: Diagnosis not present

## 2022-02-06 DIAGNOSIS — E038 Other specified hypothyroidism: Secondary | ICD-10-CM | POA: Diagnosis not present

## 2022-02-06 DIAGNOSIS — F331 Major depressive disorder, recurrent, moderate: Secondary | ICD-10-CM | POA: Diagnosis not present

## 2022-02-06 DIAGNOSIS — K219 Gastro-esophageal reflux disease without esophagitis: Secondary | ICD-10-CM | POA: Diagnosis not present

## 2022-02-06 DIAGNOSIS — R29898 Other symptoms and signs involving the musculoskeletal system: Secondary | ICD-10-CM

## 2022-02-06 DIAGNOSIS — J159 Unspecified bacterial pneumonia: Secondary | ICD-10-CM | POA: Diagnosis not present

## 2022-02-06 DIAGNOSIS — Z743 Need for continuous supervision: Secondary | ICD-10-CM | POA: Diagnosis not present

## 2022-02-06 DIAGNOSIS — Z6841 Body Mass Index (BMI) 40.0 and over, adult: Secondary | ICD-10-CM | POA: Diagnosis not present

## 2022-02-06 LAB — BASIC METABOLIC PANEL
Anion gap: 7 (ref 5–15)
BUN: 8 mg/dL (ref 8–23)
CO2: 36 mmol/L — ABNORMAL HIGH (ref 22–32)
Calcium: 8.8 mg/dL — ABNORMAL LOW (ref 8.9–10.3)
Chloride: 101 mmol/L (ref 98–111)
Creatinine, Ser: 0.81 mg/dL (ref 0.44–1.00)
GFR, Estimated: >60 mL/min (ref >60)
Glucose, Bld: 102 mg/dL — ABNORMAL HIGH (ref 70–99)
Potassium: 4 mmol/L (ref 3.5–5.1)
Sodium: 144 mmol/L (ref 135–145)

## 2022-02-06 LAB — CBC
HCT: 38.3 % (ref 36.0–46.0)
Hemoglobin: 11.7 g/dL — ABNORMAL LOW (ref 12.0–15.0)
MCH: 29.6 pg (ref 26.0–34.0)
MCHC: 30.5 g/dL (ref 30.0–36.0)
MCV: 97 fL (ref 80.0–100.0)
Platelets: 231 10*3/uL (ref 150–400)
RBC: 3.95 MIL/uL (ref 3.87–5.11)
RDW: 16 % — ABNORMAL HIGH (ref 11.5–15.5)
WBC: 7.6 10*3/uL (ref 4.0–10.5)
nRBC: 0 % (ref 0.0–0.2)

## 2022-02-06 LAB — GLUCOSE, CAPILLARY
Glucose-Capillary: 108 mg/dL — ABNORMAL HIGH (ref 70–99)
Glucose-Capillary: 117 mg/dL — ABNORMAL HIGH (ref 70–99)
Glucose-Capillary: 105 mg/dL — ABNORMAL HIGH (ref 70–99)

## 2022-02-06 NOTE — Discharge Summary (Signed)
Physician Discharge Summary  ?Ariel Alvarez ELT:[id number] DOB: 10-Aug-1946 DOA: 01/31/2022 ? ?PCP: Enid Baas, MD ? ?Admit date: 01/31/2022 ?Discharge date: 02/06/2022 ? ?Admitted From: home  ?Disposition:  SNF ? ?Recommendations for Outpatient Follow-up:  ?Follow up with PCP in 1-2 weeks ? ? ?Home Health: no  ?Equipment/Devices: ? ?Discharge Condition: stable  ?CODE STATUS: full  ?Diet recommendation: Carb Modified  ? ?Brief/Interim Summary: ?HPI was taken from Dr. Joylene Igo: ?Ariel Alvarez is a 76 y.o. female with medical history significant for morbid obesity (BMI 47.47 kg/m2), diabetes mellitus, history of CVA with left upper extremity weakness, hypothyroidism, diabetes mellitus, depression, GERD who was brought into the ER via EMS for evaluation of multiple symptoms which include 1 week history of weakness and unsteady gait, cough productive of yellow phlegm, subjective fever and diarrhea. ?Per EMS in the field patient had wheezing and was hypoxic requiring oxygen supplementation.  She was placed on 2 L per EMS and upon arrival to the ER pulse oximetry was 87 to 88% and her oxygen was increased to 3 L. ?She states that her husband has similar symptoms and that she has not felt well in over a week.  Her oral intake has been poor as well.  She denies having any nausea or vomiting. ?She complains of abdominal pain mostly periumbilical and rates her pain a 4 x 10 in intensity at its worst.  Pain is associated with diarrhea she denies having any nausea or vomiting.  She has no urinary symptoms, no headache, no blurred vision, no focal deficit, no dizziness or lightheadedness. ? ? ?As per Dr. Mayford Knife 4/6-4/11/23: Pt found to have CAP on admission and was started on IV ceftriaxone, azithromycin, bronchodilators, incentive spirometry & supplemental oxygen. Pt was able to be weaned off of oxygen prior to d/c. PT/OT evaluated the pt and recommended SNF. For more information, please see previous progress notes.   ? ?Discharge Diagnoses:  ?Principal Problem: ?  CAP (community acquired pneumonia) ?Active Problems: ?  Acute respiratory failure with hypoxia (HCC) ?  Collagen vascular disease (HCC) ?  Other pulmonary embolism without acute cor pulmonale (HCC) ?  Morbid obesity with BMI of 40.0-44.9, adult (HCC) ?  Hypothyroidism ?  Depressive disorder ?  Diabetes (HCC) ?  Weakness of both lower extremities ?  Lactic acidosis ? ?CAP: completed abx course. Continue on bronchodilators & encourage incentive spirometry. Continue on supplemental oxygen and wean as tolerated ? ?Acute hypoxic respiratory failure: likely secondary to underlying community-acquired pneumonia. Upon arrival to the ER she had room air pulse oximetry of 87%. Weaned off of supplemental oxygen  ? ?Hypokalemia: WNL today  ?  ?Lactic acidosis: resolved  ?  ?Weakness of both lower extremities: PT/OT recs SNF  ?  ?DM2: HbA1c 6.8, pretty well controlled. Restart home po anti-DM meds at d/c ? ?Peripheral neuropathy: continue on home dose of gabapentin  ?  ?Depressive disorder: severity unknown. Continue on seroquel, venlafaxine  ?  ?Hypothyroidism: continue on levothyroxine  ?  ?Morbid obesity: BMI 47.4. Complicates overall care & prognosis ?  ?Pulmonary embolism: continue on eliquis  ?  ?Collagen vascular disease: continue on home dose of plaquenil  ? ?Discharge Instructions ? ?Discharge Instructions   ? ? Diet Carb Modified   Complete by: As directed ?  ? Discharge instructions   Complete by: As directed ?  ? F/u w/ PCP in 1-2 weeks  ? Increase activity slowly   Complete by: As directed ?  ? ?  ? ?Allergies as  of 02/06/2022   ? ?   Reactions  ? Latex Hives  ? Prednisone Nausea And Vomiting  ? Pt states causes N/V even when taken with food.  ? ?  ? ?  ?Medication List  ?  ? ?TAKE these medications   ? ?acetaminophen 325 MG tablet ?Commonly known as: TYLENOL ?Take 325-650 mg by mouth every 6 (six) hours as needed for mild pain or moderate pain. ?  ?albuterol 108 (90  Base) MCG/ACT inhaler ?Commonly known as: VENTOLIN HFA ?Inhale 2 puffs into the lungs every 6 (six) hours as needed for wheezing or shortness of breath. ?  ?apixaban 5 MG Tabs tablet ?Commonly known as: ELIQUIS ?Take 5 mg by mouth 2 (two) times daily. ?  ?aspirin 81 MG EC tablet ?Take 81 mg by mouth daily. ?  ?atorvastatin 40 MG tablet ?Commonly known as: LIPITOR ?Take 40 mg by mouth every morning. ?  ?cetirizine 10 MG tablet ?Commonly known as: ZYRTEC ?Take 10 mg by mouth daily after lunch. ?  ?clonazePAM 1 MG tablet ?Commonly known as: KLONOPIN ?Take 1 mg by mouth daily as needed for anxiety. ?  ?desvenlafaxine 50 MG 24 hr tablet ?Commonly known as: PRISTIQ ?Take 50 mg by mouth daily. ?  ?folic acid 1 MG tablet ?Commonly known as: FOLVITE ?Take 1 mg by mouth daily. ?  ?hydroxychloroquine 200 MG tablet ?Commonly known as: PLAQUENIL ?Take 200 mg by mouth daily. ?  ?levothyroxine 100 MCG tablet ?Commonly known as: SYNTHROID ?Take 100 mcg by mouth daily before breakfast. ?  ?metFORMIN 500 MG tablet ?Commonly known as: GLUCOPHAGE ?Take 500 mg by mouth 2 (two) times daily with a meal. ?  ?methocarbamol 500 MG tablet ?Commonly known as: ROBAXIN ?Take 500 mg by mouth at bedtime as needed for muscle spasms. ?  ?methotrexate 2.5 MG tablet ?Commonly known as: RHEUMATREX ?Take 17.5 mg by mouth every Wednesday. ?  ?omeprazole 20 MG capsule ?Commonly known as: PRILOSEC ?Take 20 mg by mouth every morning. ?  ?QUEtiapine 50 MG tablet ?Commonly known as: SEROQUEL ?Take 1.5 tablets (75 mg total) by mouth at bedtime. Dose increase ?  ?tolterodine 2 MG 24 hr capsule ?Commonly known as: DETROL LA ?Take 2 mg by mouth daily. ?  ? ?  ? ? Contact information for after-discharge care   ? ? Destination   ? ? [facility] .   ?Service: Skilled Nursing ?Contact information: ?[address] ?[place] [address] ?[phone] ? ?  ?  ? ?  ?  ? ?  ?  ? ?  ? ?Allergies  ?Allergen Reactions  ? Latex Hives  ?  Prednisone Nausea And Vomiting  ?  Pt states causes N/V even when taken with food.  ? ? ?Consultations: ? ? ? ?Procedures/Studies: ?DG Chest Port 1 View ? ?Result Date: 01/31/2022 ?CLINICAL DATA:  Question sepsis.  Weakness.  Shortness of breath. EXAM: PORTABLE CHEST 1 VIEW COMPARISON:  09/15/2021 FINDINGS: heart size is normal. Mediastinal shadows are unremarkable. Question mild patchy infiltrate or atelectasis at the left lung base. The remainder of the chest appears clear. No effusion. IMPRESSION: Question mild patchy infiltrate/atelectasis at the left lung base. Electronically Signed   By: Paulina Fusi M.D.   On: 01/31/2022 12:15   ?(Echo, Carotid, EGD, Colonoscopy, ERCP)  ? ? ?Subjective: Pt c/o fatigue  ? ? ?Discharge Exam: ?Vitals:  ? 02/06/22 0510 02/06/22 0904  ?BP: (!) 120/99 (!) 118/54  ?Pulse: 84 80  ?Resp: 16 17  ?  Temp: 98.5 ?F (36.9 ?C) 97.8 ?F (36.6 ?C)  ?SpO2: 90% (!) 88%  ? ?Vitals:  ? 02/05/22 1610 02/05/22 2018 02/06/22 0510 02/06/22 0904  ?BP: 92/80 128/65 (!) 120/99 (!) 118/54  ?Pulse: 68 75 84 80  ?Resp: ?Temp: 98.5 ?F (36.9 ?C) 98.9 ?F (37.2 ?C) 98.5 ?F (36.9 ?C) 97.8 ?F (36.6 ?C)  ?TempSrc: Oral   Oral  ?SpO2: 93% 90% 90% (!) 88%  ?Weight:      ?Height:      ? ? ?General: Pt is alert, awake, not in acute distress ?Cardiovascular: S1/S2 +, no rubs, no gallops ?Respiratory: diminished breath sounds b/l otherwise clear  ?Abdominal: Soft, NT, obese, bowel sounds + ?Extremities: no edema, no cyanosis ? ? ? ?The results of significant diagnostics from this hospitalization (including imaging, microbiology, ancillary and laboratory) are listed below for reference.   ? ? ?Microbiology: ?Recent Results (from the past 240 hour(s))  ?Resp Panel by RT-PCR (Flu A&B, Covid) Nasopharyngeal Swab     Status: None  ? Collection Time: 01/31/22 11:53 AM  ? Specimen: Nasopharyngeal Swab; Nasopharyngeal(NP) swabs in vial transport medium  ?Result Value Ref Range Status  ? SARS Coronavirus 2 by RT PCR  NEGATIVE NEGATIVE Final  ?  Comment: (NOTE) ?SARS-CoV-2 target nucleic acids are NOT DETECTED. ? ?The SARS-CoV-2 RNA is generally detectable in upper respiratory ?specimens during the acute phase of infection.

## 2022-02-06 NOTE — Progress Notes (Signed)
Pt A/Ox4 upon review of AVS. PIV removed. Pt assisted with getting dressed. EMS called. Report will be called once phone number is received.  ?

## 2022-02-06 NOTE — TOC Progression Note (Signed)
Transition of Care (TOC) - Progression Note  ? ? ?Patient Details  ?Name: KIONDRA FRACE ?MRN: [id number] ?Date of Birth: [birth year] ? ?Transition of Care (TOC) CM/SW Contact  ?Caryn Section, RN ?Phone Number: ?02/06/2022, 1:21 PM ? ?Clinical Narrative:   [facility], patient can transfer today.  Family notified. Room 602P, Marcelino Duster Aware   Transfer via EMs ? ? ? ?Expected Discharge Plan: Skilled Nursing Facility ?Barriers to Discharge: Continued Medical Work up, English as a second language teacher, SNF Pending bed offer ? ?Expected Discharge Plan and Services ?Expected Discharge Plan: Skilled Nursing Facility ?  ?Discharge Planning Services: CM Consult ?  ?Living arrangements for the past 2 months: Single Family Home ?                ?  ?  ?  ?  ?  ?  ?  ?  ?  ?  ? ? ?Social Determinants of Health (SDOH) Interventions ?  ? ?Readmission Risk Interventions ?   ? View : No data to display.  ?  ?  ?  ? ? ?

## 2022-02-07 DIAGNOSIS — Z86711 Personal history of pulmonary embolism: Secondary | ICD-10-CM | POA: Diagnosis not present

## 2022-02-07 DIAGNOSIS — J9600 Acute respiratory failure, unspecified whether with hypoxia or hypercapnia: Secondary | ICD-10-CM | POA: Diagnosis not present

## 2022-02-07 DIAGNOSIS — E1140 Type 2 diabetes mellitus with diabetic neuropathy, unspecified: Secondary | ICD-10-CM | POA: Diagnosis not present

## 2022-02-07 DIAGNOSIS — J159 Unspecified bacterial pneumonia: Secondary | ICD-10-CM | POA: Diagnosis not present

## 2022-02-07 DIAGNOSIS — M6281 Muscle weakness (generalized): Secondary | ICD-10-CM | POA: Diagnosis not present

## 2022-02-07 DIAGNOSIS — E038 Other specified hypothyroidism: Secondary | ICD-10-CM | POA: Diagnosis not present

## 2022-02-07 DIAGNOSIS — F331 Major depressive disorder, recurrent, moderate: Secondary | ICD-10-CM | POA: Diagnosis not present

## 2022-02-09 DIAGNOSIS — F331 Major depressive disorder, recurrent, moderate: Secondary | ICD-10-CM | POA: Diagnosis not present

## 2022-02-09 DIAGNOSIS — J9600 Acute respiratory failure, unspecified whether with hypoxia or hypercapnia: Secondary | ICD-10-CM | POA: Diagnosis not present

## 2022-02-09 DIAGNOSIS — E1140 Type 2 diabetes mellitus with diabetic neuropathy, unspecified: Secondary | ICD-10-CM | POA: Diagnosis not present

## 2022-02-09 DIAGNOSIS — J159 Unspecified bacterial pneumonia: Secondary | ICD-10-CM | POA: Diagnosis not present

## 2022-02-09 DIAGNOSIS — M6281 Muscle weakness (generalized): Secondary | ICD-10-CM | POA: Diagnosis not present

## 2022-02-09 DIAGNOSIS — E038 Other specified hypothyroidism: Secondary | ICD-10-CM | POA: Diagnosis not present

## 2022-02-09 DIAGNOSIS — Z86711 Personal history of pulmonary embolism: Secondary | ICD-10-CM | POA: Diagnosis not present

## 2022-02-12 DIAGNOSIS — Z86711 Personal history of pulmonary embolism: Secondary | ICD-10-CM | POA: Diagnosis not present

## 2022-02-12 DIAGNOSIS — M6281 Muscle weakness (generalized): Secondary | ICD-10-CM | POA: Diagnosis not present

## 2022-02-12 DIAGNOSIS — J9600 Acute respiratory failure, unspecified whether with hypoxia or hypercapnia: Secondary | ICD-10-CM | POA: Diagnosis not present

## 2022-02-12 DIAGNOSIS — J159 Unspecified bacterial pneumonia: Secondary | ICD-10-CM | POA: Diagnosis not present

## 2022-02-12 DIAGNOSIS — E1140 Type 2 diabetes mellitus with diabetic neuropathy, unspecified: Secondary | ICD-10-CM | POA: Diagnosis not present

## 2022-02-12 DIAGNOSIS — E038 Other specified hypothyroidism: Secondary | ICD-10-CM | POA: Diagnosis not present

## 2022-02-14 DIAGNOSIS — M6281 Muscle weakness (generalized): Secondary | ICD-10-CM | POA: Diagnosis not present

## 2022-02-14 DIAGNOSIS — J9600 Acute respiratory failure, unspecified whether with hypoxia or hypercapnia: Secondary | ICD-10-CM | POA: Diagnosis not present

## 2022-02-14 DIAGNOSIS — J159 Unspecified bacterial pneumonia: Secondary | ICD-10-CM | POA: Diagnosis not present

## 2022-02-14 DIAGNOSIS — E1140 Type 2 diabetes mellitus with diabetic neuropathy, unspecified: Secondary | ICD-10-CM | POA: Diagnosis not present

## 2022-02-14 DIAGNOSIS — E038 Other specified hypothyroidism: Secondary | ICD-10-CM | POA: Diagnosis not present

## 2022-02-14 DIAGNOSIS — F331 Major depressive disorder, recurrent, moderate: Secondary | ICD-10-CM | POA: Diagnosis not present

## 2022-02-14 DIAGNOSIS — Z86711 Personal history of pulmonary embolism: Secondary | ICD-10-CM | POA: Diagnosis not present

## 2022-02-15 DIAGNOSIS — F331 Major depressive disorder, recurrent, moderate: Secondary | ICD-10-CM | POA: Diagnosis not present

## 2022-02-15 DIAGNOSIS — E038 Other specified hypothyroidism: Secondary | ICD-10-CM | POA: Diagnosis not present

## 2022-02-15 DIAGNOSIS — E1140 Type 2 diabetes mellitus with diabetic neuropathy, unspecified: Secondary | ICD-10-CM | POA: Diagnosis not present

## 2022-02-15 DIAGNOSIS — Z86711 Personal history of pulmonary embolism: Secondary | ICD-10-CM | POA: Diagnosis not present

## 2022-02-15 DIAGNOSIS — M6281 Muscle weakness (generalized): Secondary | ICD-10-CM | POA: Diagnosis not present

## 2022-02-15 DIAGNOSIS — J9600 Acute respiratory failure, unspecified whether with hypoxia or hypercapnia: Secondary | ICD-10-CM | POA: Diagnosis not present

## 2022-02-15 DIAGNOSIS — J159 Unspecified bacterial pneumonia: Secondary | ICD-10-CM | POA: Diagnosis not present

## 2022-02-15 DIAGNOSIS — K219 Gastro-esophageal reflux disease without esophagitis: Secondary | ICD-10-CM | POA: Diagnosis not present

## 2022-02-16 DIAGNOSIS — M6281 Muscle weakness (generalized): Secondary | ICD-10-CM | POA: Diagnosis not present

## 2022-02-16 DIAGNOSIS — J9600 Acute respiratory failure, unspecified whether with hypoxia or hypercapnia: Secondary | ICD-10-CM | POA: Diagnosis not present

## 2022-02-16 DIAGNOSIS — E038 Other specified hypothyroidism: Secondary | ICD-10-CM | POA: Diagnosis not present

## 2022-02-16 DIAGNOSIS — Z86711 Personal history of pulmonary embolism: Secondary | ICD-10-CM | POA: Diagnosis not present

## 2022-02-16 DIAGNOSIS — R300 Dysuria: Secondary | ICD-10-CM | POA: Diagnosis not present

## 2022-02-16 DIAGNOSIS — J159 Unspecified bacterial pneumonia: Secondary | ICD-10-CM | POA: Diagnosis not present

## 2022-02-16 DIAGNOSIS — E1140 Type 2 diabetes mellitus with diabetic neuropathy, unspecified: Secondary | ICD-10-CM | POA: Diagnosis not present

## 2022-02-19 DIAGNOSIS — E038 Other specified hypothyroidism: Secondary | ICD-10-CM | POA: Diagnosis not present

## 2022-02-19 DIAGNOSIS — E1140 Type 2 diabetes mellitus with diabetic neuropathy, unspecified: Secondary | ICD-10-CM | POA: Diagnosis not present

## 2022-02-19 DIAGNOSIS — J159 Unspecified bacterial pneumonia: Secondary | ICD-10-CM | POA: Diagnosis not present

## 2022-02-19 DIAGNOSIS — F331 Major depressive disorder, recurrent, moderate: Secondary | ICD-10-CM | POA: Diagnosis not present

## 2022-02-19 DIAGNOSIS — J9600 Acute respiratory failure, unspecified whether with hypoxia or hypercapnia: Secondary | ICD-10-CM | POA: Diagnosis not present

## 2022-02-19 DIAGNOSIS — M6281 Muscle weakness (generalized): Secondary | ICD-10-CM | POA: Diagnosis not present

## 2022-02-19 DIAGNOSIS — Z86711 Personal history of pulmonary embolism: Secondary | ICD-10-CM | POA: Diagnosis not present

## 2022-02-23 DIAGNOSIS — E1140 Type 2 diabetes mellitus with diabetic neuropathy, unspecified: Secondary | ICD-10-CM | POA: Diagnosis not present

## 2022-02-23 DIAGNOSIS — J159 Unspecified bacterial pneumonia: Secondary | ICD-10-CM | POA: Diagnosis not present

## 2022-02-23 DIAGNOSIS — E038 Other specified hypothyroidism: Secondary | ICD-10-CM | POA: Diagnosis not present

## 2022-02-23 DIAGNOSIS — Z86711 Personal history of pulmonary embolism: Secondary | ICD-10-CM | POA: Diagnosis not present

## 2022-02-23 DIAGNOSIS — M6281 Muscle weakness (generalized): Secondary | ICD-10-CM | POA: Diagnosis not present

## 2022-02-23 DIAGNOSIS — J9600 Acute respiratory failure, unspecified whether with hypoxia or hypercapnia: Secondary | ICD-10-CM | POA: Diagnosis not present

## 2022-02-23 DIAGNOSIS — F331 Major depressive disorder, recurrent, moderate: Secondary | ICD-10-CM | POA: Diagnosis not present

## 2022-02-23 DIAGNOSIS — R300 Dysuria: Secondary | ICD-10-CM | POA: Diagnosis not present

## 2022-03-15 DIAGNOSIS — I69391 Dysphagia following cerebral infarction: Secondary | ICD-10-CM | POA: Diagnosis not present

## 2022-03-15 DIAGNOSIS — E1142 Type 2 diabetes mellitus with diabetic polyneuropathy: Secondary | ICD-10-CM | POA: Diagnosis not present

## 2022-03-15 DIAGNOSIS — Z1620 Resistance to unspecified antibiotic: Secondary | ICD-10-CM | POA: Diagnosis not present

## 2022-03-15 DIAGNOSIS — I69354 Hemiplegia and hemiparesis following cerebral infarction affecting left non-dominant side: Secondary | ICD-10-CM | POA: Diagnosis not present

## 2022-03-15 DIAGNOSIS — N390 Urinary tract infection, site not specified: Secondary | ICD-10-CM | POA: Diagnosis not present

## 2022-03-15 DIAGNOSIS — I10 Essential (primary) hypertension: Secondary | ICD-10-CM | POA: Diagnosis not present

## 2022-03-15 DIAGNOSIS — I2699 Other pulmonary embolism without acute cor pulmonale: Secondary | ICD-10-CM | POA: Diagnosis not present

## 2022-03-15 DIAGNOSIS — J9601 Acute respiratory failure with hypoxia: Secondary | ICD-10-CM | POA: Diagnosis not present

## 2022-03-15 DIAGNOSIS — R1312 Dysphagia, oropharyngeal phase: Secondary | ICD-10-CM | POA: Diagnosis not present

## 2022-03-15 DIAGNOSIS — R309 Painful micturition, unspecified: Secondary | ICD-10-CM | POA: Diagnosis not present

## 2022-03-15 DIAGNOSIS — R8290 Unspecified abnormal findings in urine: Secondary | ICD-10-CM | POA: Diagnosis not present

## 2022-04-16 DIAGNOSIS — I7782 Antineutrophilic cytoplasmic antibody (ANCA) vasculitis: Secondary | ICD-10-CM | POA: Diagnosis not present

## 2022-04-16 DIAGNOSIS — Z7960 Long term (current) use of unspecified immunomodulators and immunosuppressants: Secondary | ICD-10-CM | POA: Diagnosis not present

## 2022-04-28 DIAGNOSIS — H5203 Hypermetropia, bilateral: Secondary | ICD-10-CM | POA: Diagnosis not present

## 2022-05-19 DIAGNOSIS — Z0100 Encounter for examination of eyes and vision without abnormal findings: Secondary | ICD-10-CM | POA: Diagnosis not present

## 2022-05-21 DIAGNOSIS — E039 Hypothyroidism, unspecified: Secondary | ICD-10-CM | POA: Diagnosis not present

## 2022-05-21 DIAGNOSIS — I776 Arteritis, unspecified: Secondary | ICD-10-CM | POA: Diagnosis not present

## 2022-05-21 DIAGNOSIS — G8110 Spastic hemiplegia affecting unspecified side: Secondary | ICD-10-CM | POA: Diagnosis not present

## 2022-05-21 DIAGNOSIS — I63411 Cerebral infarction due to embolism of right middle cerebral artery: Secondary | ICD-10-CM | POA: Diagnosis not present

## 2022-05-21 DIAGNOSIS — G2581 Restless legs syndrome: Secondary | ICD-10-CM | POA: Diagnosis not present

## 2022-05-21 DIAGNOSIS — F32A Depression, unspecified: Secondary | ICD-10-CM | POA: Diagnosis not present

## 2022-05-21 DIAGNOSIS — I7782 Antineutrophilic cytoplasmic antibody (ANCA) vasculitis: Secondary | ICD-10-CM | POA: Diagnosis not present

## 2022-05-21 DIAGNOSIS — E119 Type 2 diabetes mellitus without complications: Secondary | ICD-10-CM | POA: Diagnosis not present

## 2022-05-21 DIAGNOSIS — Z1211 Encounter for screening for malignant neoplasm of colon: Secondary | ICD-10-CM | POA: Diagnosis not present

## 2022-07-05 DIAGNOSIS — F32A Depression, unspecified: Secondary | ICD-10-CM | POA: Diagnosis not present

## 2022-07-05 DIAGNOSIS — I639 Cerebral infarction, unspecified: Secondary | ICD-10-CM | POA: Diagnosis not present

## 2022-07-05 DIAGNOSIS — J069 Acute upper respiratory infection, unspecified: Secondary | ICD-10-CM | POA: Diagnosis not present

## 2022-07-05 DIAGNOSIS — R638 Other symptoms and signs concerning food and fluid intake: Secondary | ICD-10-CM | POA: Diagnosis not present

## 2022-07-05 DIAGNOSIS — Z6841 Body Mass Index (BMI) 40.0 and over, adult: Secondary | ICD-10-CM | POA: Diagnosis not present

## 2022-07-05 DIAGNOSIS — Z780 Asymptomatic menopausal state: Secondary | ICD-10-CM | POA: Diagnosis not present

## 2022-07-05 DIAGNOSIS — R5383 Other fatigue: Secondary | ICD-10-CM | POA: Diagnosis not present

## 2022-07-05 DIAGNOSIS — B372 Candidiasis of skin and nail: Secondary | ICD-10-CM | POA: Diagnosis not present

## 2022-07-05 DIAGNOSIS — E678 Other specified hyperalimentation: Secondary | ICD-10-CM | POA: Diagnosis not present

## 2022-07-05 DIAGNOSIS — F09 Unspecified mental disorder due to known physiological condition: Secondary | ICD-10-CM | POA: Diagnosis not present

## 2022-08-24 DIAGNOSIS — I69354 Hemiplegia and hemiparesis following cerebral infarction affecting left non-dominant side: Secondary | ICD-10-CM | POA: Diagnosis not present

## 2022-08-24 DIAGNOSIS — I7782 Antineutrophilic cytoplasmic antibody (ANCA) vasculitis: Secondary | ICD-10-CM | POA: Diagnosis not present

## 2022-08-24 DIAGNOSIS — I69351 Hemiplegia and hemiparesis following cerebral infarction affecting right dominant side: Secondary | ICD-10-CM | POA: Diagnosis not present

## 2022-08-24 DIAGNOSIS — I10 Essential (primary) hypertension: Secondary | ICD-10-CM | POA: Diagnosis not present

## 2022-08-24 DIAGNOSIS — M24542 Contracture, left hand: Secondary | ICD-10-CM | POA: Diagnosis not present

## 2022-08-24 DIAGNOSIS — H539 Unspecified visual disturbance: Secondary | ICD-10-CM | POA: Diagnosis not present

## 2022-08-24 DIAGNOSIS — E119 Type 2 diabetes mellitus without complications: Secondary | ICD-10-CM | POA: Diagnosis not present

## 2022-08-24 DIAGNOSIS — R208 Other disturbances of skin sensation: Secondary | ICD-10-CM | POA: Diagnosis not present

## 2022-08-24 DIAGNOSIS — I69398 Other sequelae of cerebral infarction: Secondary | ICD-10-CM | POA: Diagnosis not present

## 2022-08-30 DIAGNOSIS — Z6841 Body Mass Index (BMI) 40.0 and over, adult: Secondary | ICD-10-CM | POA: Diagnosis not present

## 2022-08-30 DIAGNOSIS — R5383 Other fatigue: Secondary | ICD-10-CM | POA: Diagnosis not present

## 2022-08-30 DIAGNOSIS — I639 Cerebral infarction, unspecified: Secondary | ICD-10-CM | POA: Diagnosis not present

## 2022-08-30 DIAGNOSIS — K219 Gastro-esophageal reflux disease without esophagitis: Secondary | ICD-10-CM | POA: Diagnosis not present

## 2022-08-30 DIAGNOSIS — F09 Unspecified mental disorder due to known physiological condition: Secondary | ICD-10-CM | POA: Diagnosis not present

## 2022-08-30 DIAGNOSIS — Z23 Encounter for immunization: Secondary | ICD-10-CM | POA: Diagnosis not present

## 2022-08-30 DIAGNOSIS — F32A Depression, unspecified: Secondary | ICD-10-CM | POA: Diagnosis not present

## 2022-09-13 ENCOUNTER — Ambulatory Visit
Admission: EM | Admit: 2022-09-13 | Discharge: 2022-09-13 | Disposition: A | Discharge status: Home / Self Care | Payer: Medicare HMO | Attending: Physician Assistant | Admitting: Physician Assistant

## 2022-09-13 DIAGNOSIS — L304 Erythema intertrigo: Secondary | ICD-10-CM | POA: Insufficient documentation

## 2022-09-13 DIAGNOSIS — R300 Dysuria: Secondary | ICD-10-CM | POA: Diagnosis not present

## 2022-09-13 DIAGNOSIS — R350 Frequency of micturition: Secondary | ICD-10-CM | POA: Insufficient documentation

## 2022-09-13 DIAGNOSIS — B3731 Acute candidiasis of vulva and vagina: Secondary | ICD-10-CM | POA: Diagnosis not present

## 2022-09-13 LAB — URINALYSIS, ROUTINE W REFLEX MICROSCOPIC
Bilirubin Urine: NEGATIVE
Glucose, UA: NEGATIVE mg/dL
Hgb urine dipstick: NEGATIVE
Ketones, ur: NEGATIVE mg/dL
Leukocytes,Ua: NEGATIVE
Nitrite: NEGATIVE
Protein, ur: NEGATIVE mg/dL
Specific Gravity, Urine: 1.025 (ref 1.005–1.030)
pH: 5 (ref 5.0–8.0)

## 2022-09-13 MED ORDER — PHENAZOPYRIDINE HCL 200 MG PO TABS: 200.0000 mg | ORAL_TABLET | Freq: Three times a day (TID) | ORAL | 0 refills | Status: DC

## 2022-09-13 MED ORDER — FLUCONAZOLE 150 MG PO TABS: ORAL_TABLET | ORAL | 0 refills | Status: DC

## 2022-09-13 MED ORDER — CLOTRIMAZOLE 1 % EX CREA: TOPICAL_CREAM | CUTANEOUS | 0 refills | Status: DC

## 2022-09-13 NOTE — ED Triage Notes (Signed)
Patient c.o vaginal pain and pressure. Patent reports urinary frequency with burning with urination -- started about 3 days ago.

## 2022-09-13 NOTE — ED Provider Notes (Signed)
[facility]    CSN: [id number] Arrival date & time: 09/13/22  1056      History   Chief Complaint Chief Complaint  Patient presents with   Dysuria   Urinary Frequency    HPI Ariel Alvarez is a 76 y.o. female presenting with approximately 2-3 day history of dysuria and increased urinary frequency.  Patient also admits to red rash in her groin area and under her pannus.  Additionally she says she has some itching in these areas.  She denies any fever, fatigue, lower abdominal pain or back pain.  No vaginal discharge or hematuria.  Patient does have a history of diabetes.  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  No other complaints or concerns.  HPI  Past Medical History:  Diagnosis Date   Anxiety    Arthritis    RIGHT HAND   Collagen vascular disease (HCC)    Complication of anesthesia    HARD TO WAKE UP AFTER  ONE SURGERY-PT STAETS SHE WAS GIVEN TOO MUCH ANESTHESIA   Depression    Dyspnea    VERY RARE WITH EXERTION   GERD (gastroesophageal reflux disease)    History of kidney stones    H/O   Hypothyroidism    Stroke (HCC) 4098,1191   X2-LEFT HAD PARALYZED     Patient Active Problem List   Diagnosis Date Noted   CAP (community acquired pneumonia) 01/31/2022   Diabetes (HCC) 01/31/2022   Acute respiratory failure with hypoxia (HCC) 01/31/2022   Weakness of both lower extremities 01/31/2022   Lactic acidosis 01/31/2022   Moderate episode of recurrent major depressive disorder (HCC) 10/03/2021   Cognitive disorder 10/03/2021   Episodic mood disorder (HCC) 10/03/2021   Psychosis (HCC) 10/03/2021   Gait disturbance 12/19/2020   Rash 07/25/2020   Increased frequency of urination 07/25/2020   Closed nondisplaced fracture of proximal phalanx of left little finger with routine healing 04/07/2020   Hyperlipidemia 12/08/2019   Stroke (HCC) 12/08/2019   Ulcerated, foot, unspecified laterality, limited to breakdown of skin (HCC)  12/08/2019   Other constipation 09/28/2019   Dysphagia 09/28/2019   Pelvic pain in female 05/18/2019   Age-related osteoporosis without current pathological fracture 08/08/2018   Urinary hesitancy 10/04/2017   Routine health maintenance 08/26/2017   Candidiasis 08/11/2017   Morbid obesity with BMI of 40.0-44.9, adult (HCC) 07/30/2017   Collagen vascular disease (HCC) 05/21/2017   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) 05/21/2017   Chronic pain 05/21/2017   Cerebrovascular 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) 05/06/2017   Preoperative clearance 05/06/2017   DVT (deep venous thrombosis) (HCC) 03/24/2017   Contracture of joint of left hand 02/17/2017   Other pulmonary embolism without acute cor pulmonale (HCC) 02/06/2017   Right pontine stroke (HCC) 02/03/2017   Vasculitis (HCC) 01/29/2017   Abnormal ANCA test 12/30/2016   Right carpal tunnel syndrome 04/06/2016   GERD (gastroesophageal reflux disease) 12/31/2014   Dyslipidemia 04/06/2014   Calculus of kidney and ureter 05/06/2012   Primary insomnia 07/06/2011   Hypothyroidism 07/06/2011   Depressive disorder 07/06/2011   Spastic hemiplegia affecting nondominant side (HCC) 03/27/2011    Past Surgical History:  Procedure Laterality Date   CHOLECYSTECTOMY     KIDNEY STONE SURGERY     METATARSAL HEAD EXCISION Bilateral 04/15/2020   Procedure: METATARSAL HEAD PARTIAL EXCISION - BILATERAL;  Surgeon: Linus Galas, DPM;  Location: [facility];  Service: Podiatry;  Laterality:  Bilateral;    OB History   No obstetric history on file.      Home Medications    Prior to Admission medications   Medication Sig Start Date End Date Taking? Authorizing Provider  clotrimazole (LOTRIMIN) 1 % cream Apply to affected area 2 times daily 09/13/22  Yes Shirlee Latch, PA-C  fluconazole (DIFLUCAN) 150 MG tablet 1 tab p.o. every 72 hours for yeast infection 09/13/22  Yes Shirlee Latch, PA-C  phenazopyridine  (PYRIDIUM) 200 MG tablet Take 1 tablet (200 mg total) by mouth 3 (three) times daily. 09/13/22  Yes Shirlee Latch, PA-C  acetaminophen (TYLENOL) 325 MG tablet Take 325-650 mg by mouth every 6 (six) hours as needed for mild pain or moderate pain.    [provider]  albuterol (VENTOLIN HFA) 108 (90 Base) MCG/ACT inhaler Inhale 2 puffs into the lungs every 6 (six) hours as needed for wheezing or shortness of breath.    [provider]  apixaban (ELIQUIS) 5 MG TABS tablet Take 5 mg by mouth 2 (two) times daily.  11/26/19   [provider]  aspirin 81 MG EC tablet Take 81 mg by mouth daily.     [provider]  atorvastatin (LIPITOR) 40 MG tablet Take 40 mg by mouth every morning.     [provider]  cetirizine (ZYRTEC) 10 MG tablet Take 10 mg by mouth daily after lunch.     [provider]  clonazePAM (KLONOPIN) 1 MG tablet Take 1 mg by mouth daily as needed for anxiety.    [provider]  desvenlafaxine (PRISTIQ) 50 MG 24 hr tablet Take 50 mg by mouth daily. 06/15/21   [provider]  folic acid (FOLVITE) 1 MG tablet Take 1 mg by mouth daily.    [provider]  hydroxychloroquine (PLAQUENIL) 200 MG tablet Take 200 mg by mouth daily.    [provider]  levothyroxine (SYNTHROID) 100 MCG tablet Take 100 mcg by mouth daily before breakfast. 08/05/20   [provider]  metFORMIN (GLUCOPHAGE) 500 MG tablet Take 500 mg by mouth 2 (two) times daily with a meal.    [provider]  methocarbamol (ROBAXIN) 500 MG tablet Take 500 mg by mouth at bedtime as needed for muscle spasms.    [provider]  methotrexate (RHEUMATREX) 2.5 MG tablet Take 17.5 mg by mouth every Wednesday.    [provider]  omeprazole (PRILOSEC) 20 MG capsule Take 20 mg by mouth every morning.     [provider]  QUEtiapine (SEROQUEL) 50 MG tablet Take 1.5 tablets (75 mg total) by mouth at bedtime.  Dose increase 10/03/21   Jomarie Longs, MD  tolterodine (DETROL LA) 2 MG 24 hr capsule Take 2 mg by mouth daily.    [provider]  eszopiclone (LUNESTA) 2 MG TABS tablet Take 2 mg by mouth at bedtime as needed for sleep. Take immediately before bedtime Patient not taking: Reported on 04/14/2020  09/08/20  [provider]  phentermine 30 MG capsule Take 30 mg by mouth every morning.  09/08/20  [provider]  warfarin (COUMADIN) 2.5 MG tablet Take 2.5 mg by mouth. Patient not taking: Reported on 04/14/2020  09/08/20  [provider]    Family History Family History  Problem Relation Age of Onset   Alcohol abuse Father    Depression Father    Suicidality Father    Cancer Paternal Aunt    Stroke Paternal Grandmother  Diabetes Son     Social History Social History   Tobacco Use   Smoking status: Former    Packs/day: 1.00    Years: 20.00    Total pack years: 20.00    Types: Cigarettes    Quit date: 10/29/1974    Years since quitting: 47.9   Smokeless tobacco: Never  Vaping Use   Vaping Use: Never used  Substance Use Topics   Alcohol use: Not Currently    Comment: WINE OCC   Drug use: No     Allergies   Latex and Prednisone   Review of Systems Review of Systems  Constitutional:  Negative for fatigue and fever.  HENT:  Negative for sore throat.   Respiratory:  Negative for cough and shortness of breath.   Cardiovascular:  Negative for chest pain.  Gastrointestinal:  Negative for abdominal pain, nausea and vomiting.  Genitourinary:  Positive for dysuria, frequency and vaginal pain. Negative for flank pain, hematuria, urgency, vaginal bleeding and vaginal discharge.  Musculoskeletal:  Negative for back pain.  Skin:  Negative for rash.     Physical Exam Triage Vital Signs ED Triage Vitals  Enc Vitals Group     BP 05/16/21 1709 134/64     Pulse Rate 05/16/21 1709 83     Resp 05/16/21 1709 18     Temp 05/16/21 1709 98.6 F (37  C)     Temp Source 05/16/21 1709 Oral     SpO2 05/16/21 1709 93 %     Weight 05/16/21 1705 268 lb 1.3 oz (121.6 kg)     Height 05/16/21 1705 5\' 3"  (1.6 m)     Head Circumference --      Peak Flow --      Pain Score 05/16/21 1704 5     Pain Loc --      Pain Edu? --      Excl. in [place]? --    No data found.  Updated Vital Signs BP 112/66 (BP Location: Right Arm)   Pulse 70   Temp 98.7 F (37.1 C) (Oral)   Ht 5\' 3"  (1.6 m)   Wt 250 lb (113.4 kg)   SpO2 95%   BMI 44.29 kg/m      Physical Exam Vitals and nursing note reviewed.  Constitutional:      General: She is not in acute distress.    Appearance: Normal appearance. She is obese. She is not ill-appearing or toxic-appearing.     Comments: Wheelchair bound, chronic left sided weakness  HENT:     Head: Normocephalic and atraumatic.  Eyes:     General: No scleral icterus.       Right eye: No discharge.        Left eye: No discharge.     Conjunctiva/sclera: Conjunctivae normal.  Cardiovascular:     Rate and Rhythm: Normal rate and regular rhythm.     Heart sounds: Normal heart sounds.  Pulmonary:     Effort: Pulmonary effort is normal. No respiratory distress.     Breath sounds: Normal breath sounds. No wheezing, rhonchi or rales.  Abdominal:     Palpations: Abdomen is soft.     Tenderness: There is no abdominal tenderness. There is no right CVA tenderness or left CVA tenderness.  Musculoskeletal:     Cervical back: Neck supple.  Skin:    General: Skin is dry.     Findings: Rash (erythematous rash under pannus and groin region) present.  Neurological:  General: No focal deficit present.     Mental Status: She is alert. Mental status is at baseline.     Motor: No weakness.     Gait: Gait normal.  Psychiatric:        Mood and Affect: Mood normal.        Behavior: Behavior normal.        Thought Content: Thought content normal.      UC Treatments / Results  Labs (all labs ordered are listed, but only  abnormal results are displayed) Labs Reviewed  URINE CULTURE  URINALYSIS, ROUTINE W REFLEX MICROSCOPIC    EKG   Radiology No results found.  Procedures Procedures (including critical care time)  Medications Ordered in UC Medications - No data to display  Initial Impression / Assessment and Plan / UC Course  I have reviewed the triage vital signs and the nursing notes.  Pertinent labs & imaging results that were available during my care of the patient were reviewed by me and considered in my medical decision making (see chart for details).  76 year old female presenting with her husband today for dysuria, rash of the groin region and urinary frequency over the past 2 to 3 days.  Vitals are stable. Her lungs are clear to auscultation on exam.   No abdominal tenderness or CVA tenderness.  I did advise a GU exam but patient declined stating she was not comfortable with this.  She does have an erythematous rash under her pannus and groin region consistent with intertrigo.  Urinalysis today is within normal limits.  We will send for culture.  Patient does not want to perform vaginal swab.    Treating patient at this time for suspected vaginal and skin yeast infection with Diflucan and clotrimazole.  Also advised increasing rest and fluids.  We will treat for UTI if her culture is positive.  Sent Pyridium in the meantime to help with the pain.  Return and ER precautions provided.  Final Clinical Impressions(s) / UC Diagnoses   Final diagnoses:  Vaginal yeast infection  Urinary frequency  Intertrigo  Dysuria     Discharge Instructions      The most common types of vaginal infections are yeast infections and bacterial vaginosis. Neither of which are really considered to be sexually transmitted. Often a pH swab or wet prep is performed and if abnormal may reveal either type of infection. Begin metronidazole if prescribed for possible BV infection. If there is concern for yeast  infection, fluconazole is often prescribed . Take this as directed. You may also apply topical miconazole (can be purchased OTC) externally for relief of itching. Increase rest and fluid intake. If labs sent out, we will call within 2-5 days with results and amend treatment if necessary. Always try to use pH balanced washes/wipes, urinate after intercourse, stay hydrated, and take probiotics if you are prone to vaginal infections. Return or see PCP or gynecologist for new/worsening infections.      ED Prescriptions     Medication Sig Dispense Auth. Provider   fluconazole (DIFLUCAN) 150 MG tablet 1 tab p.o. every 72 hours for yeast infection 2 tablet Eusebio Friendly B, PA-C   clotrimazole (LOTRIMIN) 1 % cream Apply to affected area 2 times daily 40 g Michiel Cowboy, Ryan Palermo B, PA-C   phenazopyridine (PYRIDIUM) 200 MG tablet Take 1 tablet (200 mg total) by mouth 3 (three) times daily. 6 tablet Gareth Morgan      PDMP not reviewed this encounter.  Shirlee Latch, PA-C 09/13/22 1218

## 2022-09-13 NOTE — Discharge Instructions (Addendum)
The most common types of vaginal infections are yeast infections and bacterial vaginosis. Neither of which are really considered to be sexually transmitted. Often a pH swab or wet prep is performed and if abnormal may reveal either type of infection. Begin metronidazole if prescribed for possible BV infection. If there is concern for yeast infection, fluconazole is often prescribed . Take this as directed. You may also apply topical miconazole (can be purchased OTC) externally for relief of itching. Increase rest and fluid intake. If labs sent out, we will call within 2-5 days with results and amend treatment if necessary. Always try to use pH balanced washes/wipes, urinate after intercourse, stay hydrated, and take probiotics if you are prone to vaginal infections. Return or see PCP or gynecologist for new/worsening infections.   

## 2022-09-15 LAB — URINE CULTURE: Culture: 30000 — AB

## 2022-09-17 ENCOUNTER — Telehealth (HOSPITAL_COMMUNITY): Payer: Self-pay

## 2022-09-17 MED ORDER — CEPHALEXIN 500 MG PO CAPS: 500.0000 mg | ORAL_CAPSULE | Freq: Two times a day (BID) | ORAL | 0 refills | Status: AC

## 2022-09-19 DIAGNOSIS — N898 Other specified noninflammatory disorders of vagina: Secondary | ICD-10-CM | POA: Diagnosis not present

## 2022-09-19 DIAGNOSIS — E119 Type 2 diabetes mellitus without complications: Secondary | ICD-10-CM | POA: Diagnosis not present

## 2022-09-19 DIAGNOSIS — R300 Dysuria: Secondary | ICD-10-CM | POA: Diagnosis not present

## 2022-10-02 DIAGNOSIS — R32 Unspecified urinary incontinence: Secondary | ICD-10-CM | POA: Diagnosis not present

## 2022-10-02 DIAGNOSIS — R300 Dysuria: Secondary | ICD-10-CM | POA: Diagnosis not present

## 2022-10-02 DIAGNOSIS — B3731 Acute candidiasis of vulva and vagina: Secondary | ICD-10-CM | POA: Diagnosis not present

## 2022-10-02 DIAGNOSIS — I7782 Antineutrophilic cytoplasmic antibody (ANCA) vasculitis: Secondary | ICD-10-CM | POA: Diagnosis not present

## 2022-10-02 DIAGNOSIS — L258 Unspecified contact dermatitis due to other agents: Secondary | ICD-10-CM | POA: Diagnosis not present

## 2022-10-18 DIAGNOSIS — I776 Arteritis, unspecified: Secondary | ICD-10-CM | POA: Diagnosis not present

## 2022-10-18 DIAGNOSIS — F32A Depression, unspecified: Secondary | ICD-10-CM | POA: Diagnosis not present

## 2022-10-18 DIAGNOSIS — F419 Anxiety disorder, unspecified: Secondary | ICD-10-CM | POA: Diagnosis not present

## 2022-10-18 DIAGNOSIS — Z6841 Body Mass Index (BMI) 40.0 and over, adult: Secondary | ICD-10-CM | POA: Diagnosis not present

## 2022-10-18 DIAGNOSIS — B372 Candidiasis of skin and nail: Secondary | ICD-10-CM | POA: Diagnosis not present

## 2022-10-18 DIAGNOSIS — R300 Dysuria: Secondary | ICD-10-CM | POA: Diagnosis not present

## 2022-10-18 DIAGNOSIS — F09 Unspecified mental disorder due to known physiological condition: Secondary | ICD-10-CM | POA: Diagnosis not present

## 2022-10-18 DIAGNOSIS — Z8673 Personal history of transient ischemic attack (TIA), and cerebral infarction without residual deficits: Secondary | ICD-10-CM | POA: Diagnosis not present

## 2022-11-02 DIAGNOSIS — H539 Unspecified visual disturbance: Secondary | ICD-10-CM | POA: Diagnosis not present

## 2022-11-02 DIAGNOSIS — M24542 Contracture, left hand: Secondary | ICD-10-CM | POA: Diagnosis not present

## 2022-11-02 DIAGNOSIS — I69354 Hemiplegia and hemiparesis following cerebral infarction affecting left non-dominant side: Secondary | ICD-10-CM | POA: Diagnosis not present

## 2022-11-02 DIAGNOSIS — I69398 Other sequelae of cerebral infarction: Secondary | ICD-10-CM | POA: Diagnosis not present

## 2022-11-02 DIAGNOSIS — R208 Other disturbances of skin sensation: Secondary | ICD-10-CM | POA: Diagnosis not present

## 2022-11-02 DIAGNOSIS — E119 Type 2 diabetes mellitus without complications: Secondary | ICD-10-CM | POA: Diagnosis not present

## 2022-11-02 DIAGNOSIS — I7782 Antineutrophilic cytoplasmic antibody (ANCA) vasculitis: Secondary | ICD-10-CM | POA: Diagnosis not present

## 2022-11-02 DIAGNOSIS — I69351 Hemiplegia and hemiparesis following cerebral infarction affecting right dominant side: Secondary | ICD-10-CM | POA: Diagnosis not present

## 2022-11-02 DIAGNOSIS — I10 Essential (primary) hypertension: Secondary | ICD-10-CM | POA: Diagnosis not present

## 2022-11-19 DIAGNOSIS — M2550 Pain in unspecified joint: Secondary | ICD-10-CM | POA: Diagnosis not present

## 2022-11-19 DIAGNOSIS — I776 Arteritis, unspecified: Secondary | ICD-10-CM | POA: Diagnosis not present

## 2022-11-19 DIAGNOSIS — Z79899 Other long term (current) drug therapy: Secondary | ICD-10-CM | POA: Diagnosis not present

## 2022-12-06 DIAGNOSIS — M069 Rheumatoid arthritis, unspecified: Secondary | ICD-10-CM | POA: Diagnosis present

## 2022-12-13 ENCOUNTER — Emergency Department: Payer: Medicare HMO

## 2022-12-13 ENCOUNTER — Observation Stay
Admission: EM | Admit: 2022-12-13 | Discharge: 2022-12-14 | Disposition: A | Discharge status: Home / Self Care | Payer: Medicare HMO | Attending: Internal Medicine | Admitting: Internal Medicine

## 2022-12-13 ENCOUNTER — Other Ambulatory Visit: Payer: Self-pay

## 2022-12-13 DIAGNOSIS — K91840 Postprocedural hemorrhage and hematoma of a digestive system organ or structure following a digestive system procedure: Secondary | ICD-10-CM | POA: Diagnosis not present

## 2022-12-13 DIAGNOSIS — E039 Hypothyroidism, unspecified: Secondary | ICD-10-CM | POA: Diagnosis not present

## 2022-12-13 DIAGNOSIS — Z8673 Personal history of transient ischemic attack (TIA), and cerebral infarction without residual deficits: Secondary | ICD-10-CM | POA: Diagnosis not present

## 2022-12-13 DIAGNOSIS — Z1152 Encounter for screening for COVID-19: Secondary | ICD-10-CM | POA: Diagnosis not present

## 2022-12-13 DIAGNOSIS — Z87891 Personal history of nicotine dependence: Secondary | ICD-10-CM | POA: Diagnosis not present

## 2022-12-13 DIAGNOSIS — J440 Chronic obstructive pulmonary disease with acute lower respiratory infection: Secondary | ICD-10-CM | POA: Diagnosis not present

## 2022-12-13 DIAGNOSIS — Z7984 Long term (current) use of oral hypoglycemic drugs: Secondary | ICD-10-CM | POA: Insufficient documentation

## 2022-12-13 DIAGNOSIS — K068 Other specified disorders of gingiva and edentulous alveolar ridge: Secondary | ICD-10-CM | POA: Insufficient documentation

## 2022-12-13 DIAGNOSIS — R062 Wheezing: Secondary | ICD-10-CM | POA: Insufficient documentation

## 2022-12-13 DIAGNOSIS — J209 Acute bronchitis, unspecified: Secondary | ICD-10-CM | POA: Insufficient documentation

## 2022-12-13 DIAGNOSIS — E119 Type 2 diabetes mellitus without complications: Secondary | ICD-10-CM

## 2022-12-13 DIAGNOSIS — J9601 Acute respiratory failure with hypoxia: Secondary | ICD-10-CM | POA: Diagnosis not present

## 2022-12-13 DIAGNOSIS — R0602 Shortness of breath: Secondary | ICD-10-CM | POA: Diagnosis not present

## 2022-12-13 DIAGNOSIS — Z7982 Long term (current) use of aspirin: Secondary | ICD-10-CM | POA: Diagnosis not present

## 2022-12-13 DIAGNOSIS — M069 Rheumatoid arthritis, unspecified: Secondary | ICD-10-CM | POA: Diagnosis present

## 2022-12-13 DIAGNOSIS — Z79899 Other long term (current) drug therapy: Secondary | ICD-10-CM | POA: Diagnosis not present

## 2022-12-13 DIAGNOSIS — Z86711 Personal history of pulmonary embolism: Secondary | ICD-10-CM | POA: Diagnosis not present

## 2022-12-13 DIAGNOSIS — G8929 Other chronic pain: Secondary | ICD-10-CM | POA: Diagnosis present

## 2022-12-13 DIAGNOSIS — Z7901 Long term (current) use of anticoagulants: Secondary | ICD-10-CM | POA: Diagnosis not present

## 2022-12-13 DIAGNOSIS — K219 Gastro-esophageal reflux disease without esophagitis: Secondary | ICD-10-CM | POA: Diagnosis present

## 2022-12-13 DIAGNOSIS — R0902 Hypoxemia: Secondary | ICD-10-CM

## 2022-12-13 DIAGNOSIS — E785 Hyperlipidemia, unspecified: Secondary | ICD-10-CM | POA: Diagnosis present

## 2022-12-13 DIAGNOSIS — J441 Chronic obstructive pulmonary disease with (acute) exacerbation: Secondary | ICD-10-CM

## 2022-12-13 DIAGNOSIS — K1379 Other lesions of oral mucosa: Secondary | ICD-10-CM

## 2022-12-13 LAB — CBC WITH DIFFERENTIAL/PLATELET
Abs Immature Granulocytes: 0.04 10*3/uL (ref 0.00–0.07)
Basophils Absolute: 0 10*3/uL (ref 0.0–0.1)
Basophils Relative: 0 %
Eosinophils Absolute: 0.2 10*3/uL (ref 0.0–0.5)
Eosinophils Relative: 2 %
HCT: 39.5 % (ref 36.0–46.0)
Hemoglobin: 12.4 g/dL (ref 12.0–15.0)
Immature Granulocytes: 0 %
Lymphocytes Relative: 10 %
Lymphs Abs: 1.2 10*3/uL (ref 0.7–4.0)
MCH: 30.3 pg (ref 26.0–34.0)
MCHC: 31.4 g/dL (ref 30.0–36.0)
MCV: 96.6 fL (ref 80.0–100.0)
Monocytes Absolute: 0.9 10*3/uL (ref 0.1–1.0)
Monocytes Relative: 8 %
Neutro Abs: 9 10*3/uL — ABNORMAL HIGH (ref 1.7–7.7)
Neutrophils Relative %: 80 %
Platelets: 204 10*3/uL (ref 150–400)
RBC: 4.09 MIL/uL (ref 3.87–5.11)
RDW: 15.5 % (ref 11.5–15.5)
WBC: 11.3 10*3/uL — ABNORMAL HIGH (ref 4.0–10.5)
nRBC: 0 % (ref 0.0–0.2)

## 2022-12-13 MED ORDER — FENTANYL CITRATE PF 50 MCG/ML IJ SOSY
50.0000 ug | PREFILLED_SYRINGE | Freq: Once | INTRAMUSCULAR | Status: AC
  Administered 2022-12-14: 50 ug via INTRAVENOUS
  Filled 2022-12-13: qty 1

## 2022-12-13 MED ORDER — IPRATROPIUM-ALBUTEROL 0.5-2.5 (3) MG/3ML IN SOLN
3.0000 mL | Freq: Once | RESPIRATORY_TRACT | Status: AC
  Administered 2022-12-14: 3 mL via RESPIRATORY_TRACT
  Filled 2022-12-13: qty 3

## 2022-12-13 MED ORDER — TRANEXAMIC ACID FOR EPISTAXIS
500.0000 mg | Freq: Once | TOPICAL | Status: AC
  Administered 2022-12-13: 500 mg via TOPICAL
  Filled 2022-12-13: qty 10

## 2022-12-13 NOTE — ED Notes (Signed)
Attempting to stop pts gums from bleeding at this time. TXA soaked gauze applied to pts gums a this time. Provider informed of pts hypoxia on room air. Pt will sat at 87% on room and and 02 will increase to 93% on 3L. Pt does not have a known diagnosis of COPD but was a former smoker. Pt not in distress, respirations even and nonlabored.

## 2022-12-13 NOTE — ED Provider Notes (Signed)
[facility] Provider Note    Event Date/Time   First MD Initiated Contact with Patient 12/13/22 2217     (approximate)   History   Post-op Problem   HPI  Ariel Alvarez is a 77 y.o. female who presents to the emergency department today because of concerns for dental bleeding after extractions performed today.  Patient is on Eliquis and states that she did not hold any doses.  Bleeding started shortly after arriving back home.     Physical Exam   Triage Vital Signs: ED Triage Vitals  Enc Vitals Group     BP 12/13/22 2223 (!) 155/78     Pulse Rate 12/13/22 2221 77     Resp 12/13/22 2221 16     Temp 12/13/22 2221 97.7 F (36.5 C)     Temp Source 12/13/22 2221 Oral     SpO2 12/13/22 2223 (!) 87 %     Weight 12/13/22 2223 248 lb (112.5 kg)     Height 12/13/22 2223 5' 3"$  (1.6 m)     Head Circumference --      Peak Flow --      Pain Score 12/13/22 2222 5     Pain Loc --      Pain Edu? --      Excl. in [place]? --     Most recent vital signs: Vitals:   12/13/22 2223 12/13/22 2224  BP: (!) 155/78   Pulse: 76   Resp:    Temp:    SpO2: (!) 87% 91%   General: Awake, alert, oriented. CV:  Good peripheral perfusion. Regular rate and rhythm. Resp:  Normal effort. Lungs clear Abd:  No distention.  Other:  Bleeding from right upper jaw in area of recent extraction.   ED Results / Procedures / Treatments   Labs (all labs ordered are listed, but only abnormal results are displayed) Labs Reviewed  CBC WITH DIFFERENTIAL/PLATELET - Abnormal; Notable for the following components:      Result Value   WBC 11.3 (*)    Neutro Abs 9.0 (*)    All other components within normal limits  BASIC METABOLIC PANEL - Abnormal; Notable for the following components:   Glucose, Bld 105 (*)    Calcium 8.7 (*)    All other components within normal limits  RESP PANEL BY RT-PCR (RSV, FLU A&B, COVID)  [id number]     EKG  None   RADIOLOGY I independently  interpreted and visualized the CXR. My interpretation: No pneumonia Radiology interpretation:  IMPRESSION:  No active disease.     PROCEDURES:  Critical Care performed: No  Procedures   MEDICATIONS ORDERED IN ED: Medications  tranexamic acid (CYKLOKAPRON) 1000 MG/10ML topical solution 500 mg (500 mg Topical Given 12/13/22 2236)     IMPRESSION / MDM / ASSESSMENT AND PLAN / ED COURSE  I reviewed the triage vital signs and the nursing notes.                              Differential diagnosis includes, but is not limited to, post op bleeding, pneumonia, COPD, covid  Patient's presentation is most consistent with acute presentation with potential threat to life or bodily function.  Patient presented to the emergency department today because of concerns for bleeding after tooth extractions.  Patient is on Eliquis.  On exam patient does have some bleeding to the right upper jaw.  Will try TXA to  start to see if that can help control the bleeding.  However when vital signs were obtained she was found to be hypoxic.  She denies any shortness of breath.  States she does have a history of smoking a number of years ago.  Not diagnosed with any lung disease.  Do wonder if she has some component of COPD at this point.  Will try breathing treatments to see if that helps.  Additionally chest x-ray was obtained.  This did not show any pneumonia.  Swabs were obtained. Awaiting further results at time of sign out   FINAL CLINICAL IMPRESSION(S) / ED DIAGNOSES   Dental bleeding   Note:  This document was prepared using Dragon voice recognition software and may include unintentional dictation errors.    Nance Pear, MD 12/14/22 [phone]

## 2022-12-13 NOTE — ED Triage Notes (Signed)
Pt had 4 front teeth extracted this afternoon and bleeding has not been controlled since. She takes eliquis.

## 2022-12-14 ENCOUNTER — Emergency Department: Payer: Medicare HMO

## 2022-12-14 ENCOUNTER — Encounter: Payer: Self-pay | Admitting: Internal Medicine

## 2022-12-14 DIAGNOSIS — Z86711 Personal history of pulmonary embolism: Secondary | ICD-10-CM

## 2022-12-14 DIAGNOSIS — J441 Chronic obstructive pulmonary disease with (acute) exacerbation: Secondary | ICD-10-CM

## 2022-12-14 DIAGNOSIS — J440 Chronic obstructive pulmonary disease with acute lower respiratory infection: Secondary | ICD-10-CM

## 2022-12-14 DIAGNOSIS — K068 Other specified disorders of gingiva and edentulous alveolar ridge: Secondary | ICD-10-CM

## 2022-12-14 DIAGNOSIS — K1379 Other lesions of oral mucosa: Secondary | ICD-10-CM

## 2022-12-14 DIAGNOSIS — R0902 Hypoxemia: Secondary | ICD-10-CM | POA: Insufficient documentation

## 2022-12-14 DIAGNOSIS — J209 Acute bronchitis, unspecified: Secondary | ICD-10-CM

## 2022-12-14 DIAGNOSIS — Z7901 Long term (current) use of anticoagulants: Secondary | ICD-10-CM

## 2022-12-14 LAB — CBC
HCT: 39.8 % (ref 36.0–46.0)
Hemoglobin: 12.6 g/dL (ref 12.0–15.0)
MCH: 30.7 pg (ref 26.0–34.0)
MCHC: 31.7 g/dL (ref 30.0–36.0)
MCV: 96.8 fL (ref 80.0–100.0)
Platelets: 196 10*3/uL (ref 150–400)
RBC: 4.11 MIL/uL (ref 3.87–5.11)
RDW: 15.6 % — ABNORMAL HIGH (ref 11.5–15.5)
WBC: 9.8 10*3/uL (ref 4.0–10.5)
nRBC: 0 % (ref 0.0–0.2)

## 2022-12-14 LAB — HEMOGLOBIN A1C
Hgb A1c MFr Bld: 6.2 % — ABNORMAL HIGH (ref 4.8–5.6)
Mean Plasma Glucose: 131.24 mg/dL

## 2022-12-14 LAB — BASIC METABOLIC PANEL
Anion gap: 7 (ref 5–15)
BUN: 9 mg/dL (ref 8–23)
CO2: 27 mmol/L (ref 22–32)
Calcium: 8.7 mg/dL — ABNORMAL LOW (ref 8.9–10.3)
Chloride: 106 mmol/L (ref 98–111)
Creatinine, Ser: 0.64 mg/dL (ref 0.44–1.00)
GFR, Estimated: >60 mL/min (ref >60)
Glucose, Bld: 105 mg/dL — ABNORMAL HIGH (ref 70–99)
Potassium: 3.7 mmol/L (ref 3.5–5.1)
Sodium: 140 mmol/L (ref 135–145)

## 2022-12-14 LAB — RESP PANEL BY RT-PCR (RSV, FLU A&B, COVID)  RVPGX2
Influenza A by PCR: NEGATIVE
Influenza B by PCR: NEGATIVE
Resp Syncytial Virus by PCR: NEGATIVE
SARS Coronavirus 2 by RT PCR: NEGATIVE

## 2022-12-14 LAB — CBG MONITORING, ED
Glucose-Capillary: 93 mg/dL (ref 70–99)
Glucose-Capillary: 93 mg/dL (ref 70–99)
Glucose-Capillary: 101 mg/dL — ABNORMAL HIGH (ref 70–99)

## 2022-12-14 LAB — TROPONIN I (HIGH SENSITIVITY): Troponin I (High Sensitivity): 4 ng/L (ref <18)

## 2022-12-14 LAB — BRAIN NATRIURETIC PEPTIDE: B Natriuretic Peptide: 25.9 pg/mL (ref 0.0–100.0)

## 2022-12-14 MED ORDER — ASPIRIN 81 MG PO TBEC
81.0000 mg | DELAYED_RELEASE_TABLET | Freq: Every day | ORAL | Status: DC
  Administered 2022-12-14: 81 mg via ORAL
  Filled 2022-12-14: qty 1

## 2022-12-14 MED ORDER — INSULIN ASPART 100 UNIT/ML IJ SOLN: 0.0000 [IU] | Freq: Every day | INTRAMUSCULAR | Status: DC

## 2022-12-14 MED ORDER — ALBUTEROL SULFATE (2.5 MG/3ML) 0.083% IN NEBU: 2.5000 mg | INHALATION_SOLUTION | RESPIRATORY_TRACT | Status: DC | PRN

## 2022-12-14 MED ORDER — HYDROXYCHLOROQUINE SULFATE 200 MG PO TABS
200.0000 mg | ORAL_TABLET | Freq: Every day | ORAL | Status: DC
  Administered 2022-12-14: 200 mg via ORAL
  Filled 2022-12-14: qty 1

## 2022-12-14 MED ORDER — INSULIN ASPART 100 UNIT/ML IJ SOLN: 0.0000 [IU] | Freq: Three times a day (TID) | INTRAMUSCULAR | Status: DC

## 2022-12-14 MED ORDER — METHOTREXATE SODIUM 2.5 MG PO TABS: 17.5000 mg | ORAL_TABLET | ORAL | Status: DC

## 2022-12-14 MED ORDER — HYDROCODONE-ACETAMINOPHEN 5-325 MG PO TABS
1.0000 | ORAL_TABLET | ORAL | Status: DC | PRN
  Administered 2022-12-14 (×2): 1 via ORAL
  Filled 2022-12-14 (×2): qty 1

## 2022-12-14 MED ORDER — ACETAMINOPHEN 650 MG RE SUPP: 650.0000 mg | Freq: Four times a day (QID) | RECTAL | Status: DC | PRN

## 2022-12-14 MED ORDER — ONDANSETRON HCL 4 MG/2ML IJ SOLN: 4.0000 mg | Freq: Four times a day (QID) | INTRAMUSCULAR | Status: DC | PRN

## 2022-12-14 MED ORDER — METHOCARBAMOL 500 MG PO TABS: 500.0000 mg | ORAL_TABLET | Freq: Every evening | ORAL | Status: DC | PRN

## 2022-12-14 MED ORDER — GUAIFENESIN ER 600 MG PO TB12
600.0000 mg | ORAL_TABLET | Freq: Two times a day (BID) | ORAL | Status: DC
  Administered 2022-12-14 (×2): 600 mg via ORAL
  Filled 2022-12-14 (×2): qty 1

## 2022-12-14 MED ORDER — CLONAZEPAM 0.5 MG PO TABS: 1.0000 mg | ORAL_TABLET | Freq: Every day | ORAL | Status: DC | PRN

## 2022-12-14 MED ORDER — ONDANSETRON HCL 4 MG PO TABS: 4.0000 mg | ORAL_TABLET | Freq: Four times a day (QID) | ORAL | Status: DC | PRN

## 2022-12-14 MED ORDER — APIXABAN 5 MG PO TABS: 5.0000 mg | ORAL_TABLET | Freq: Two times a day (BID) | ORAL | 0 refills | Status: AC

## 2022-12-14 MED ORDER — ACETAMINOPHEN 325 MG PO TABS: 650.0000 mg | ORAL_TABLET | Freq: Four times a day (QID) | ORAL | Status: DC | PRN

## 2022-12-14 MED ORDER — PANTOPRAZOLE SODIUM 40 MG PO TBEC
40.0000 mg | DELAYED_RELEASE_TABLET | Freq: Every day | ORAL | Status: DC
  Administered 2022-12-14: 40 mg via ORAL
  Filled 2022-12-14: qty 1

## 2022-12-14 MED ORDER — IOHEXOL 350 MG/ML SOLN
100.0000 mL | Freq: Once | INTRAVENOUS | Status: AC | PRN
  Administered 2022-12-14: 100 mL via INTRAVENOUS

## 2022-12-14 MED ORDER — LORATADINE 10 MG PO TABS
10.0000 mg | ORAL_TABLET | Freq: Every day | ORAL | Status: DC
  Administered 2022-12-14: 10 mg via ORAL
  Filled 2022-12-14: qty 1

## 2022-12-14 MED ORDER — LEVOTHYROXINE SODIUM 100 MCG PO TABS
100.0000 ug | ORAL_TABLET | Freq: Every day | ORAL | Status: DC
  Administered 2022-12-14: 100 ug via ORAL
  Filled 2022-12-14: qty 2

## 2022-12-14 MED ORDER — VENLAFAXINE HCL ER 75 MG PO CP24
75.0000 mg | ORAL_CAPSULE | Freq: Every day | ORAL | Status: DC
  Administered 2022-12-14: 75 mg via ORAL
  Filled 2022-12-14: qty 1

## 2022-12-14 MED ORDER — QUETIAPINE FUMARATE 25 MG PO TABS
75.0000 mg | ORAL_TABLET | Freq: Every day | ORAL | Status: DC
  Administered 2022-12-14: 75 mg via ORAL
  Filled 2022-12-14: qty 3

## 2022-12-14 MED ORDER — ASPIRIN 81 MG PO TBEC: 81.0000 mg | DELAYED_RELEASE_TABLET | Freq: Every evening | ORAL | 0 refills | Status: AC

## 2022-12-14 MED ORDER — ATORVASTATIN CALCIUM 20 MG PO TABS
40.0000 mg | ORAL_TABLET | ORAL | Status: DC
  Administered 2022-12-14: 40 mg via ORAL
  Filled 2022-12-14: qty 2

## 2022-12-14 MED ORDER — FENTANYL CITRATE PF 50 MCG/ML IJ SOSY
50.0000 ug | PREFILLED_SYRINGE | Freq: Once | INTRAMUSCULAR | Status: AC
  Administered 2022-12-14: 50 ug via INTRAVENOUS
  Filled 2022-12-14: qty 1

## 2022-12-14 MED ORDER — IPRATROPIUM-ALBUTEROL 0.5-2.5 (3) MG/3ML IN SOLN
3.0000 mL | Freq: Four times a day (QID) | RESPIRATORY_TRACT | Status: DC
  Administered 2022-12-14 (×2): 3 mL via RESPIRATORY_TRACT
  Filled 2022-12-14 (×2): qty 3

## 2022-12-14 NOTE — Assessment & Plan Note (Signed)
Continue levothyroxine 

## 2022-12-14 NOTE — Assessment & Plan Note (Addendum)
Continue Plaquenil and methotrexate

## 2022-12-14 NOTE — ED Notes (Signed)
Pt tried on room air and tolerating well. O2 sats in high 90s

## 2022-12-14 NOTE — Hospital Course (Signed)
Depression, class III obesity with BMI 40-44.9, diabetes, hypothyroidism, collagen vascular disease on Plaquenil, history of PE on Eliquis, spastic gait disturbance, who is being admitted for new oxygen requirement of 3 L.  Patient initially presented to the ED for bleeding gums following tooth extraction which successfully resolved with TXA applied in the ED.  She denies choking on blood but does endorse some wheezing and shortness of breath.  She was previously in her usual state of health and denies chest pain, cough, fever or chills. ED course and data review: BP 155/78, O2 sat 87% on room air requiring 3 L to maintain sats in the mid 90s.  Notable labs include WBC of 11,300.  Troponin 4 and BNP 25.9.  Respiratory viral panel negative for COVID flu and RSV. CTA chest consistent with COPD as follows: IMPRESSION: 1. No pulmonary embolism. 2. Bronchial wall thickening in keeping with airway inflammation, peripheral airway impaction, and multifocal air trapping all in keeping with changes of underlying COPD. 3. Mild coronary artery calcification.  Patient was initially treated with TXA soaked gauze with cessation of all bleeding from the mouth.  She was treated with a DuoNeb.  Hospitalist consulted for admission.

## 2022-12-14 NOTE — Assessment & Plan Note (Signed)
Continue omeprazole 

## 2022-12-14 NOTE — ED Notes (Signed)
Pt taken to CT.

## 2022-12-14 NOTE — Assessment & Plan Note (Signed)
Potential complicating factor to overall prognosis and care

## 2022-12-14 NOTE — Assessment & Plan Note (Signed)
Chronic anticoagulation CTA negative for PE Holding apixaban dose tonight due to bleeding gums

## 2022-12-14 NOTE — Assessment & Plan Note (Signed)
Continue home methocarbamol

## 2022-12-14 NOTE — Assessment & Plan Note (Signed)
Continue atorvastatin

## 2022-12-14 NOTE — Assessment & Plan Note (Signed)
Resolved with TXA soaked gauze in the ED Will hold dose of apixaban tonight

## 2022-12-14 NOTE — Assessment & Plan Note (Signed)
Sliding scale insulin coverage 

## 2022-12-14 NOTE — H&P (Signed)
History and Physical    Patient: Ariel Alvarez [id number] DOB: 1945/12/18 DOA: 12/13/2022 DOS: the patient was seen and examined on 12/14/2022 PCP: Patient, No Pcp Per  Patient coming from: Home  Chief Complaint:  Chief Complaint  Patient presents with   Post-op Problem    HPI: DEVIN WILFERT is a 77 y.o. female with medical history significant for Depression, class III obesity with BMI 40-44.9, diabetes, hypothyroidism, collagen vascular disease on Plaquenil, history of PE on Eliquis, spastic hemiplegia, who is being admitted for new oxygen requirement of 3 L.  Patient initially presented to the ED for bleeding gums following tooth extraction which successfully resolved with TXA applied in the ED.  She denies choking on blood but does endorse some wheezing and shortness of breath.  She was previously in her usual state of health and denies chest pain, cough, fever or chills. ED course and data review: BP 155/78, O2 sat 87% on room air requiring 3 L to maintain sats in the mid 90s.  Notable labs include WBC of 11,300.  Troponin 4 and BNP 25.9.  Respiratory viral panel negative for COVID flu and RSV. CTA chest consistent with COPD as follows: IMPRESSION: 1. No pulmonary embolism. 2. Bronchial wall thickening in keeping with airway inflammation, peripheral airway impaction, and multifocal air trapping all in keeping with changes of underlying COPD. 3. Mild coronary artery calcification.  Patient was initially treated with TXA soaked gauze with cessation of all bleeding from the mouth.  She was treated with a DuoNeb.  Hospitalist consulted for admission.   Review of Systems: As mentioned in the history of present illness. All other systems reviewed and are negative.  Past Medical History:  Diagnosis Date   Anxiety    Arthritis    RIGHT HAND   Collagen vascular disease ([place])    Complication of anesthesia    HARD TO WAKE UP AFTER  ONE SURGERY-PT STAETS SHE WAS GIVEN TOO MUCH  ANESTHESIA   Depression    Dyspnea    VERY RARE WITH EXERTION   GERD (gastroesophageal reflux disease)    History of kidney stones    H/O   Hypothyroidism    Stroke ([place]) [id number]   X2-LEFT HAD PARALYZED    Past Surgical History:  Procedure Laterality Date   CHOLECYSTECTOMY     KIDNEY STONE SURGERY     METATARSAL HEAD EXCISION Bilateral 04/15/2020   Procedure: METATARSAL HEAD PARTIAL EXCISION - BILATERAL;  Surgeon: Sharlotte Alamo, DPM;  Location: [facility] ORS;  Service: Podiatry;  Laterality: Bilateral;   Social History:  reports that she quit smoking about 48 years ago. Her smoking use included cigarettes. She has a 20.00 pack-year smoking history. She has never used smokeless tobacco. She reports that she does not currently use alcohol. She reports that she does not use drugs.  Allergies  Allergen Reactions   Latex Hives   Prednisone Nausea And Vomiting    Pt states causes N/V even when taken with food.    Family History  Problem Relation Age of Onset   Alcohol abuse Father    Depression Father    Suicidality Father    Cancer Paternal Aunt    Stroke Paternal Grandmother    Diabetes Son     Prior to Admission medications   Medication Sig Start Date End Date Taking? Authorizing Provider  acetaminophen (TYLENOL) 325 MG tablet Take 325-650 mg by mouth every 6 (six) hours as needed for mild pain or moderate pain.  [provider]  albuterol (VENTOLIN HFA) 108 (90 Base) MCG/ACT inhaler Inhale 2 puffs into the lungs every 6 (six) hours as needed for wheezing or shortness of breath.    [provider]  apixaban (ELIQUIS) 5 MG TABS tablet Take 5 mg by mouth 2 (two) times daily.  11/26/19   [provider]  aspirin 81 MG EC tablet Take 81 mg by mouth daily.     [provider]  atorvastatin (LIPITOR) 40 MG tablet Take 40 mg by mouth every morning.     [provider]  cetirizine (ZYRTEC) 10 MG tablet Take 10 mg by mouth daily after lunch.      [provider]  clonazePAM (KLONOPIN) 1 MG tablet Take 1 mg by mouth daily as needed for anxiety.    [provider]  clotrimazole (LOTRIMIN) 1 % cream Apply to affected area 2 times daily 09/13/22   Laurene Footman B, PA-C  desvenlafaxine (PRISTIQ) 50 MG 24 hr tablet Take 50 mg by mouth daily. 06/15/21   [provider]  fluconazole (DIFLUCAN) 150 MG tablet 1 tab p.o. every 72 hours for yeast infection 09/13/22   Danton Clap, PA-C  folic acid (FOLVITE) 1 MG tablet Take 1 mg by mouth daily.    [provider]  hydroxychloroquine (PLAQUENIL) 200 MG tablet Take 200 mg by mouth daily.    [provider]  levothyroxine (SYNTHROID) 100 MCG tablet Take 100 mcg by mouth daily before breakfast. 08/05/20   [provider]  metFORMIN (GLUCOPHAGE) 500 MG tablet Take 500 mg by mouth 2 (two) times daily with a meal.    [provider]  methocarbamol (ROBAXIN) 500 MG tablet Take 500 mg by mouth at bedtime as needed for muscle spasms.    [provider]  methotrexate (RHEUMATREX) 2.5 MG tablet Take 17.5 mg by mouth every Wednesday.    [provider]  omeprazole (PRILOSEC) 20 MG capsule Take 20 mg by mouth every morning.     [provider]  phenazopyridine (PYRIDIUM) 200 MG tablet Take 1 tablet (200 mg total) by mouth 3 (three) times daily. 09/13/22   Danton Clap, PA-C  QUEtiapine (SEROQUEL) 50 MG tablet Take 1.5 tablets (75 mg total) by mouth at bedtime. Dose increase 10/03/21   Ursula Alert, MD  tolterodine (DETROL LA) 2 MG 24 hr capsule Take 2 mg by mouth daily.    [provider]  eszopiclone (LUNESTA) 2 MG TABS tablet Take 2 mg by mouth at bedtime as needed for sleep. Take immediately before bedtime Patient not taking: Reported on 04/14/2020  09/08/20  [provider]  phentermine 30 MG capsule Take 30 mg by mouth every morning.  09/08/20  [provider]  warfarin (COUMADIN) 2.5 MG  tablet Take 2.5 mg by mouth. Patient not taking: Reported on 04/14/2020  09/08/20  [provider]    Physical Exam: Vitals:   12/13/22 2223 12/13/22 2224 12/14/22 0100 12/14/22 0128  BP: (!) 155/78  118/82   Pulse: 76  99 80  Resp:      Temp:      TempSrc:      SpO2: (!) 87% 91%  94%  Weight: 112.5 kg     Height: 5' 3"$  (1.6 m)      Physical Exam Vitals and nursing note reviewed.  Constitutional:      General: She is not in acute distress. HENT:     Head: Normocephalic and atraumatic.  Mouth/Throat:     Comments: Bloodied gauze seen in mouth Cardiovascular:     Rate and Rhythm: Normal rate and regular rhythm.     Heart sounds: Normal heart sounds.  Pulmonary:     Effort: Pulmonary effort is normal.     Comments: Few faint wheezes Abdominal:     Palpations: Abdomen is soft.     Tenderness: There is no abdominal tenderness.  Neurological:     Mental Status: Mental status is at baseline.     Labs on Admission: I have personally reviewed following labs and imaging studies  CBC: Recent Labs  Lab 12/13/22 2330  WBC 11.3*  NEUTROABS 9.0*  HGB 12.4  HCT 39.5  MCV 96.6  PLT 0000000   Basic Metabolic Panel: Recent Labs  Lab 12/13/22 2330  NA 140  K 3.7  CL 106  CO2 27  GLUCOSE 105*  BUN 9  CREATININE 0.64  CALCIUM 8.7*   GFR: Estimated Creatinine Clearance: 74.4 mL/min (by C-G formula based on SCr of 0.64 mg/dL). Liver Function Tests: No results for input(s): "AST", "ALT", "ALKPHOS", "BILITOT", "PROT", "ALBUMIN" in the last 168 hours. No results for input(s): "LIPASE", "AMYLASE" in the last 168 hours. No results for input(s): "AMMONIA" in the last 168 hours. Coagulation Profile: No results for input(s): "INR", "PROTIME" in the last 168 hours. Cardiac Enzymes: No results for input(s): "CKTOTAL", "CKMB", "CKMBINDEX", "TROPONINI" in the last 168 hours. BNP (last 3 results) No results for input(s): "PROBNP" in the last 8760 hours. HbA1C: No  results for input(s): "HGBA1C" in the last 72 hours. CBG: No results for input(s): "GLUCAP" in the last 168 hours. Lipid Profile: No results for input(s): "CHOL", "HDL", "LDLCALC", "TRIG", "CHOLHDL", "LDLDIRECT" in the last 72 hours. Thyroid Function Tests: No results for input(s): "TSH", "T4TOTAL", "FREET4", "T3FREE", "THYROIDAB" in the last 72 hours. Anemia Panel: No results for input(s): "VITAMINB12", "FOLATE", "FERRITIN", "TIBC", "IRON", "RETICCTPCT" in the last 72 hours. Urine analysis:    Component Value Date/Time   COLORURINE YELLOW 09/13/2022 1106   APPEARANCEUR CLEAR 09/13/2022 1106   LABSPEC 1.025 09/13/2022 1106   PHURINE 5.0 09/13/2022 1106   GLUCOSEU NEGATIVE 09/13/2022 1106   HGBUR NEGATIVE 09/13/2022 1106   [place] 09/13/2022 1106   [place] 09/13/2022 1106   [employer] 09/13/2022 1106   NITRITE NEGATIVE 09/13/2022 1106   LEUKOCYTESUR NEGATIVE 09/13/2022 1106    Radiological Exams on Admission: CT Angio Chest PE W/Cm &/Or Wo Cm  Result Date: 12/14/2022 CLINICAL DATA:  Pulmonary embolism (PE) suspected, high prob hypoxia EXAM: CT ANGIOGRAPHY CHEST WITH CONTRAST TECHNIQUE: Multidetector CT imaging of the chest was performed using the standard protocol during bolus administration of intravenous contrast. Multiplanar CT image reconstructions and MIPs were obtained to evaluate the vascular anatomy. RADIATION DOSE REDUCTION: This exam was performed according to the departmental dose-optimization program which includes automated exposure control, adjustment of the mA and/or kV according to patient size and/or use of iterative reconstruction technique. CONTRAST:  171m OMNIPAQUE IOHEXOL 350 MG/ML SOLN COMPARISON:  None Available. FINDINGS: Cardiovascular: Adequate opacification of the pulmonary arterial tree. No intraluminal filling defect identified to suggest acute pulmonary embolism. Central pulmonary arteries are of normal caliber. Mild coronary  artery calcification. Cardiac size within normal limits. No pericardial effusion. Mild atherosclerotic calcification within the thoracic aorta. No aortic aneurysm. Mediastinum/Nodes: The thyroid gland is atrophic or absent. No pathologic thoracic adenopathy. Esophagus unremarkable. Lungs/Pleura: There is bronchial wall thickening and scattered areas of airway impaction peripherally in keeping with changes  of airway inflammation. There is superimposed mosaic attenuation within the lungs in keeping with multifocal air trapping related to small airway disease. No focal consolidation. No pneumothorax or pleural effusion. No central obstructing lesion. Upper Abdomen: No acute abnormality. Musculoskeletal: No acute bone abnormality. Review of the MIP images confirms the above findings. IMPRESSION: 1. No pulmonary embolism. 2. Bronchial wall thickening in keeping with airway inflammation, peripheral airway impaction, and multifocal air trapping all in keeping with changes of underlying COPD. 3. Mild coronary artery calcification. Aortic Atherosclerosis (ICD10-I70.0). Electronically Signed   By: Fidela Salisbury M.D.   On: 12/14/2022 01:36   DG Chest Portable 1 View  Result Date: 12/13/2022 CLINICAL DATA:  Uncontrolled bleeding after recent tooth extraction. EXAM: PORTABLE CHEST 1 VIEW COMPARISON:  January 31, 2022 FINDINGS: The heart size and mediastinal contours are within normal limits. Low lung volumes are noted. Both lungs are clear. A chronic deformity is seen involving the left humeral head and neck. IMPRESSION: No active disease. Electronically Signed   By: Virgina Norfolk M.D.   On: 12/13/2022 23:08     Data Reviewed: Relevant notes from primary care and specialist visits, past discharge summaries as available in EHR, including Care Everywhere. Prior diagnostic testing as pertinent to current admission diagnoses Updated medications and problem lists for reconciliation ED course, including vitals, labs,  imaging, treatment and response to treatment Triage notes, nursing and pharmacy notes and ED provider's notes Notable results as noted in HPI   Assessment and Plan: COPD with acute bronchitis ([place]) Acute respiratory failure with hypoxia Regarding hypoxia: CTA ruled out PE.  Shows airway inflammation typical of COPD.  No evidence of aspiration into airway.  Possible sleep apnea Continue O2 at 3 L and wean as tolerated CTA chest showing mucous plugging consistent with COPD DuoNebs as needed for  Bleeding gums s/p tooth extraction Resolved with TXA soaked gauze in the ED Will hold dose of apixaban tonight  History of pulmonary embolism Chronic anticoagulation CTA negative for PE Holding apixaban dose tonight due to bleeding gums  Rheumatoid arthritis ([facility]) Continue Plaquenil and methotrexate  Diabetes ([facility]) Sliding scale insulin coverage  Chronic pain Continue home methocarbamol  Dyslipidemia Continue atorvastatin  GERD (gastroesophageal reflux disease) Continue omeprazole  Hypothyroidism Continue levothyroxine  Morbid obesity with BMI of 40.0-44.9, adult ([place]) Potential complicating factor to overall prognosis and care        DVT prophylaxis: SCD  Consults: none  Advance Care Planning:   Code Status: Prior   Family Communication: none  Disposition Plan: Back to previous home environment  Severity of Illness: The appropriate patient status for this patient is OBSERVATION. Observation status is judged to be reasonable and necessary in order to provide the required intensity of service to ensure the patient's safety. The patient's presenting symptoms, physical exam findings, and initial radiographic and laboratory data in the context of their medical condition is felt to place them at decreased risk for further clinical deterioration. Furthermore, it is anticipated that the patient will be medically stable for discharge from the hospital within 2 midnights of  admission.   Author: Athena Masse, MD 12/14/2022 2:44 AM  For on call review [email or web address].

## 2022-12-14 NOTE — ED Provider Notes (Signed)
-----------------------------------------   12:32 AM on 12/14/2022 -----------------------------------------   Assumed care of patient who was originally here for oral bleeding but found to be hypoxic.  No documented history of COPD although she is a remote smoker.  Respiratory panel is negative.  Patient to have DuoNeb.  She is allergic to prednisone (nausea/vomiting).  Will check troponin, BMP and CTA chest to evaluate for PE.  ----------------------------------------- 1:49 AM on 12/14/2022 -----------------------------------------   CTA negative for PE.  Demonstrated airway trapping and inflammation consistent with COPD.  Room air saturation 89%, placed back on 3 L nasal cannula oxygen.  No oral bleeding.  Will consult hospital services for evaluation and admission.   Paulette Blanch, MD 12/14/22 [phone]

## 2022-12-14 NOTE — Assessment & Plan Note (Addendum)
Acute respiratory failure with hypoxia Regarding hypoxia: CTA ruled out PE.  Shows airway inflammation typical of COPD.  No evidence of aspiration into airway.  Possible sleep apnea Continue O2 at 3 L and wean as tolerated CTA chest showing mucous plugging consistent with COPD DuoNebs as needed for

## 2023-03-09 ENCOUNTER — Emergency Department: Payer: Medicare PPO

## 2023-03-09 ENCOUNTER — Other Ambulatory Visit: Payer: Self-pay

## 2023-03-09 ENCOUNTER — Emergency Department
Admission: EM | Admit: 2023-03-09 | Discharge: 2023-03-10 | Discharge status: Against Medical Advice | Payer: Medicare PPO | Attending: Emergency Medicine | Admitting: Emergency Medicine

## 2023-03-09 DIAGNOSIS — Z5321 Procedure and treatment not carried out due to patient leaving prior to being seen by health care provider: Secondary | ICD-10-CM | POA: Insufficient documentation

## 2023-03-09 DIAGNOSIS — R102 Pelvic and perineal pain: Secondary | ICD-10-CM | POA: Diagnosis not present

## 2023-03-09 DIAGNOSIS — R350 Frequency of micturition: Secondary | ICD-10-CM | POA: Insufficient documentation

## 2023-03-09 DIAGNOSIS — R519 Headache, unspecified: Secondary | ICD-10-CM | POA: Insufficient documentation

## 2023-03-09 LAB — CBC
HCT: 43.3 % (ref 36.0–46.0)
Hemoglobin: 13.1 g/dL (ref 12.0–15.0)
MCH: 29.3 pg (ref 26.0–34.0)
MCHC: 30.3 g/dL (ref 30.0–36.0)
MCV: 96.9 fL (ref 80.0–100.0)
Platelets: 231 10*3/uL (ref 150–400)
RBC: 4.47 MIL/uL (ref 3.87–5.11)
RDW: 16.1 % — ABNORMAL HIGH (ref 11.5–15.5)
WBC: 5.5 10*3/uL (ref 4.0–10.5)
nRBC: 0 % (ref 0.0–0.2)

## 2023-03-09 LAB — COMPREHENSIVE METABOLIC PANEL
ALT: 26 U/L (ref 0–44)
AST: 30 U/L (ref 15–41)
Albumin: 3.7 g/dL (ref 3.5–5.0)
Alkaline Phosphatase: 78 U/L (ref 38–126)
Anion gap: 5 (ref 5–15)
BUN: 8 mg/dL (ref 8–23)
CO2: 27 mmol/L (ref 22–32)
Calcium: 8.4 mg/dL — ABNORMAL LOW (ref 8.9–10.3)
Chloride: 108 mmol/L (ref 98–111)
Creatinine, Ser: 0.58 mg/dL (ref 0.44–1.00)
GFR, Estimated: >60 mL/min (ref >60)
Glucose, Bld: 106 mg/dL — ABNORMAL HIGH (ref 70–99)
Potassium: 3.6 mmol/L (ref 3.5–5.1)
Sodium: 140 mmol/L (ref 135–145)
Total Bilirubin: 0.9 mg/dL (ref 0.3–1.2)
Total Protein: 6.6 g/dL (ref 6.5–8.1)

## 2023-03-09 NOTE — ED Triage Notes (Signed)
Pt to ED via [facility] c/o UTI. Pt says it hurts when she urinates and she has been urinating more frequently. Pt denies any auditory/visual hallucinations. Pt A&Ox4.

## 2023-03-09 NOTE — ED Notes (Signed)
Red top sent to lab.  

## 2023-03-09 NOTE — ED Triage Notes (Signed)
First Nurse note Pt from home via EMS. Per EMS report,  upon their arrival pt informed paramedic that her recliner was burning her organs, reported also vaginal pain ans burning sensation with urination. Pt's partner informed EMS that pt is hallucinating. Pt thinks sand is falling from the ceiling onto her clothes. Pt's partner Ezzard Standing  [phone]

## 2023-03-10 LAB — URINALYSIS, ROUTINE W REFLEX MICROSCOPIC
Bacteria, UA: NONE SEEN
Bilirubin Urine: NEGATIVE
Glucose, UA: NEGATIVE mg/dL
Hgb urine dipstick: NEGATIVE
Ketones, ur: NEGATIVE mg/dL
Leukocytes,Ua: NEGATIVE
Nitrite: NEGATIVE
Protein, ur: NEGATIVE mg/dL
Specific Gravity, Urine: >1.03 — ABNORMAL HIGH (ref 1.005–1.030)
pH: 5 (ref 5.0–8.0)

## 2023-03-10 LAB — TROPONIN I (HIGH SENSITIVITY): Troponin I (High Sensitivity): 4 ng/L (ref <18)

## 2023-04-10 ENCOUNTER — Ambulatory Visit: Payer: Medicare PPO | Attending: Internal Medicine

## 2023-04-10 DIAGNOSIS — R262 Difficulty in walking, not elsewhere classified: Secondary | ICD-10-CM | POA: Diagnosis present

## 2023-04-10 DIAGNOSIS — M6281 Muscle weakness (generalized): Secondary | ICD-10-CM | POA: Insufficient documentation

## 2023-04-10 NOTE — Therapy (Signed)
OUTPATIENT PHYSICAL THERAPY BALANCE EVALUATION   Patient Name: Ariel Alvarez MRN: [id number] DOB:1945/11/14, 77 y.o., female Today's Date: 04/11/2023  END OF SESSION:  PT End of Session - 04/11/23 1415     Visit Number 1    Number of Visits 17    Date for PT Re-Evaluation 06/05/23    Authorization Type eval: 04/10/23    PT Start Time 0800    PT Stop Time 0845    PT Time Calculation (min) 45 min    Equipment Utilized During Treatment Gait belt    Activity Tolerance Patient limited by fatigue    Behavior During Therapy WFL for tasks assessed/performed            Past Medical History:  Diagnosis Date   Anxiety    Arthritis    RIGHT HAND   Collagen vascular disease (HCC)    Complication of anesthesia    HARD TO WAKE UP AFTER  ONE SURGERY-PT STAETS SHE WAS GIVEN TOO MUCH ANESTHESIA   Depression    Dyspnea    VERY RARE WITH EXERTION   GERD (gastroesophageal reflux disease)    History of kidney stones    H/O   Hypothyroidism    Stroke (HCC) 4259,5638   X2-LEFT HAD PARALYZED    Past Surgical History:  Procedure Laterality Date   CHOLECYSTECTOMY     KIDNEY STONE SURGERY     METATARSAL HEAD EXCISION Bilateral 04/15/2020   Procedure: METATARSAL HEAD PARTIAL EXCISION - BILATERAL;  Surgeon: Linus Galas, DPM;  Location: [facility] ORS;  Service: Podiatry;  Laterality: Bilateral;   Patient Active Problem List   Diagnosis Date Noted   Hypoxia 12/14/2022   COPD with acute bronchitis (HCC) 12/14/2022   History of pulmonary embolism 12/14/2022   Chronic anticoagulation 12/14/2022   Bleeding gums 12/14/2022   Rheumatoid arthritis (HCC) 12/06/2022   Type 2 diabetes mellitus without complication, without long-term current use of insulin (HCC) 05/21/2022   CAP (community acquired pneumonia) 01/31/2022   Diabetes (HCC) 01/31/2022   Acute respiratory failure with hypoxia (HCC) 01/31/2022   Weakness of both lower extremities 01/31/2022   Lactic acidosis 01/31/2022   Moderate  episode of recurrent major depressive disorder (HCC) 10/03/2021   Cognitive disorder 10/03/2021   Episodic mood disorder (HCC) 10/03/2021   Psychosis (HCC) 10/03/2021   Gait disturbance 12/19/2020   Rash 07/25/2020   Increased frequency of urination 07/25/2020   Closed nondisplaced fracture of proximal phalanx of left little finger with routine healing 04/07/2020   Hyperlipidemia 12/08/2019   Stroke (HCC) 12/08/2019   Ulcerated, foot, unspecified laterality, limited to breakdown of skin (HCC) 12/08/2019   Other constipation 09/28/2019   Dysphagia 09/28/2019   Pelvic pain in female 05/18/2019   Age-related osteoporosis without current pathological fracture 08/08/2018   Urinary hesitancy 10/04/2017   Routine health maintenance 08/26/2017   Candidiasis 08/11/2017   Morbid obesity with BMI of 40.0-44.9, adult (HCC) 07/30/2017   Collagen vascular disease (HCC) 05/21/2017   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) 05/21/2017   Chronic pain 05/21/2017   Cerebrovascular 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) 05/06/2017   Preoperative clearance 05/06/2017   DVT (deep venous thrombosis) (HCC) 03/24/2017   Contracture of joint of left hand 02/17/2017   Other pulmonary embolism without acute cor pulmonale (HCC) 02/06/2017   Right pontine stroke (HCC) 02/03/2017   Vasculitis (HCC) 01/29/2017   Abnormal ANCA test 12/30/2016   Right carpal tunnel syndrome 04/06/2016   GERD (gastroesophageal reflux  disease) 12/31/2014   Dyslipidemia 04/06/2014   Calculus of kidney and ureter 05/06/2012   Primary insomnia 07/06/2011   Hypothyroidism 07/06/2011   Depressive disorder 07/06/2011   Spastic hemiplegia affecting nondominant side (HCC) 03/27/2011    PCP: Tawnya Crook, MD  REFERRING PROVIDER: Tawnya Crook, MD   REFERRING DIAG: [phone] (ICD-10-CM) - Personal history of transient ischemic attack (TIA), and cerebral infarction without residual deficits, R26.9 (ICD-10-CM)  - Unspecified abnormalities of gait and mobility  RATIONALE FOR EVALUATION AND TREATMENT: Rehabilitation  THERAPY DIAG: Difficulty in walking, not elsewhere classified  Muscle weakness (generalized)  ONSET DATE: 2012  FOLLOW-UP APPT SCHEDULED WITH REFERRING PROVIDER: Yes, next month   SUBJECTIVE:                                                                                                                                                                                         SUBJECTIVE STATEMENT:  "I want to be able to walk and stand on my own. I want to be able to do stairs."  PERTINENT HISTORY:  Pt referred for physical therapy for mobility deficits related to multiple chronic strokes with spastic hemiplegia affecting her nondominant side (LUE>LLE). She lives in a single story home (ramp entry) with her spouse. Bathroom has walk-in shower with grabs bars, built-in seat and handheld shower, and ADA height toilet w/grab bars. She currently ambulates limited distances at home with a hemiwalker. She has a history of repeated bouts of physical therapy including multiple episodes with this particular therapist. Most recently she received HH PT from October 2023 through January 2024. Her work with [facility] was limited due to complaints of foot pain and intolerance to activity. She did not reach her goals and pt states that she has not been consistent with her HEP since discharge. Per patient "they didn't do anything for me." Her HH PT notes are very detailed and it appears that patient's limitations are related to medical complications, pain, and a lack of compliance. She made very little progress improving from maxA with most mobility to modA. This is consistent with her prior episodes with this therapy where little to no progress was achieved. Pt denies an recent change in her baseline function. Pt stated goals are, "I want to be able to walk and stand on my own. I want to be able to do stairs." These  have been her goals for many years and she has been unable to achieve them in the past. Her medical history is complex and her PMH includes morbid obesity, RA, COPD, DM, peripheral neuropathy, MDD, mood disorder, cognitive disorder, dysphagia, vasculitis, osteoporosis, CVA, and chronic pain among other  ongoing medical issues.    Pain: No Numbness/Tingling: Yes, numbness in LUE/LLE, Hx of peripheral neuropathy; Focal Weakness: Yes, L side weakness since CVA  Recent changes in overall health/medication: No Prior history of physical therapy for balance:  Yes, extensive history of outpatient and HH PT. Dominant hand: right Red flags: Negative for bowel/bladder changes, saddle paresthesia, personal history of cancer, h/o spinal tumors, h/o compression fx, h/o abdominal aneurysm, abdominal pain, chills/fever, night sweats, nausea, vomiting, unrelenting pain  PRECAUTIONS: None  WEIGHT BEARING RESTRICTIONS: No  FALLS: Has patient fallen in last 6 months? Yes. Number of falls 1  Living Environment Lives with: lives with an adult companion "significant other x 30 years" Lives in: House/apartment, one level; Stairs:  4 steps to enter, pt uses ramp; Has following equipment at home: Hemi walker, bed side commode, and walkin-shower with grab bars and seat;  Prior level of function: Needs assistance with ADLs and Needs assistance with homemaking  Occupational demands: Not working  Hobbies: Reading  Patient Goals: "I want to be able to walk and stand on my own. I want to be able to do stairs."   OBJECTIVE:   Patient Surveys  FOTO: 32, predicted improvement to 18 ABC: To be completed  Cognition Patient is oriented to person, place, and time.  Recent memory is intact.  Remote memory is intact.  Attention span and concentration are intact.  Expressive speech is intact.  Patient's fund of knowledge is within normal limits for educational level. She appears to have poor insight into her  potential to improve given failure to improve with physical therapy over multiple years and multiple episodes of care.    Gross Musculoskeletal Assessment Tremor: None Bulk: Decreased muscle bulk in LUE Tone: Spastic hemiplegia LUE>LLE;  Posture: Hemplegic LUE resting at patient's side;  AROM Deferred  LE MMT: MMT (out of 5) Right  Left   Hip flexion 5 3+  Hip extension    Hip abduction    Hip adduction    Hip internal rotation    Hip external rotation    Knee flexion 5 4+  Knee extension 5 3+  Ankle dorsiflexion 5 3+  (* = pain; Blank rows = not tested)  Sensation Diminished throughout entire LUE/LLE to light touch. Proprioception, stereognosis, and hot/cold testing deferred on this date.  Reflexes Deferred  Cranial Nerves Deferred  Coordination/Cerebellar Deferred  Bed mobility: Not tested  Transfers: Assistive device utilized: Hemi walker  Sit to stand: Min A Stand to sit: Mod A Chair to chair:  ModA+1 to transfer chair to chair; Floor:  Deferred  Curb:  Curb Comments: Deferred  Stairs: Comments: Unsafe to attempt  Gait: Gait pattern: step to pattern, decreased step length- Right, decreased step length- Left, decreased stance time- Right, decreased stance time- Left, and decreased ankle dorsiflexion- Left Distance walked:  Assistive device utilized: Hemi walker Level of assistance: Modified independence Comments: Pt ambulates very slowly with hemiwalker in RUE and step-to pattern. Spasticity noted in L ankle with absent heel strike and limited dorsiflexion range of motion;  Functional Outcome Measures  Results Comments  BERG 9/56 High risk of falls  TUG Unsafe to attempt   5TSTS Unable   2/6 Minute Walk Test Unable to attempt   10' walk speed Fastest: 47.8s = 0.06 m/s Very slow and not functional for household or community mobility   (Blank rows = not tested)   TODAY'S TREATMENT  None currently;   PATIENT EDUCATION:  Education details:  Plan of care  Person educated: Patient and Spouse Education method: Explanation Education comprehension: verbalized understanding   HOME EXERCISE PROGRAM:  None currently   ASSESSMENT:  CLINICAL IMPRESSION: Patient is a 77 y.o. female who was seen today for physical therapy evaluation and treatment for difficulty walking.   OBJECTIVE IMPAIRMENTS: Abnormal gait, decreased activity tolerance, decreased balance, decreased knowledge of condition, decreased mobility, difficulty walking, decreased strength, decreased safety awareness, impaired tone, impaired UE functional use, obesity, and pain.   ACTIVITY LIMITATIONS: carrying, lifting, bending, standing, squatting, stairs, transfers, bed mobility, bathing, toileting, and dressing  PARTICIPATION LIMITATIONS: meal prep, cleaning, laundry, medication management, driving, shopping, and community activity  PERSONAL FACTORS: Age, Behavior pattern, Past/current experiences, Time since onset of injury/illness/exacerbation, and 3+ comorbidities: DM, OP, CVA, neuropathy, cognitive impairment, MDD, mood disorder, and chronic pain  are also affecting patient's functional outcome.   REHAB POTENTIAL: Poor prior failed episodes of care  CLINICAL DECISION MAKING: Unstable/unpredictable  EVALUATION COMPLEXITY: High   GOALS: Goals reviewed with patient? No  SHORT TERM GOALS: Target date: 05/08/2023  Pt will be independent with HEP in order to improve strength and balance in order to decrease fall risk and improve function at home. Baseline:  Goal status: INITIAL   LONG TERM GOALS: Target date: 06/05/2023  Pt will increase FOTO to at least 43 to demonstrate significant improvement in function at home related to balance  Baseline: 04/10/23: 32 Goal status: INITIAL  2.  Pt will improve BERG by at least 3 points in order to demonstrate clinically significant improvement in balance.   Baseline: 04/10/23: 9/56 Goal status: INITIAL  3.  Pt will  improve ABC by at least 13% in order to demonstrate clinically significant improvement in balance confidence.      Baseline: 04/10/23: To be completed Goal status: INITIAL  4. Pt will increase by at least 0.13 m/s in order to demonstrate clinically significant improvement in household ambulation.       Baseline: 04/10/23: 0.06 m/s Goal status: INITIAL   PLAN: PT FREQUENCY: 2x/week  PT DURATION: 8 weeks  PLANNED INTERVENTIONS: Therapeutic exercises, Therapeutic activity, Neuromuscular re-education, Balance training, Gait training, Patient/Family education, Self Care, Joint mobilization, Joint manipulation, Vestibular training, Canalith repositioning, Orthotic/Fit training, DME instructions, Dry Needling, Electrical stimulation, Spinal manipulation, Spinal mobilization, Cryotherapy, Moist heat, Taping, Traction, Ultrasound, Ionotophoresis 4mg /ml Dexamethasone, Manual therapy, and Re-evaluation.  PLAN FOR NEXT SESSION: complete ABC, Two minute walk test with w/c follow, initiate strengthening and issue HEP;   Sharalyn Ink Porsche Noguchi PT, DPT, [facility]  Robbye Dede 04/11/2023, 2:49 PM

## 2023-04-14 IMAGING — DX DG CHEST 1V PORT
1 series · 1 of 1 positions shown · non-contrast
Comparison: 09/15/2021

CLINICAL DATA: Question sepsis.  Weakness.  Shortness of breath.

EXAM:
PORTABLE CHEST 1 VIEW

[chest ap]
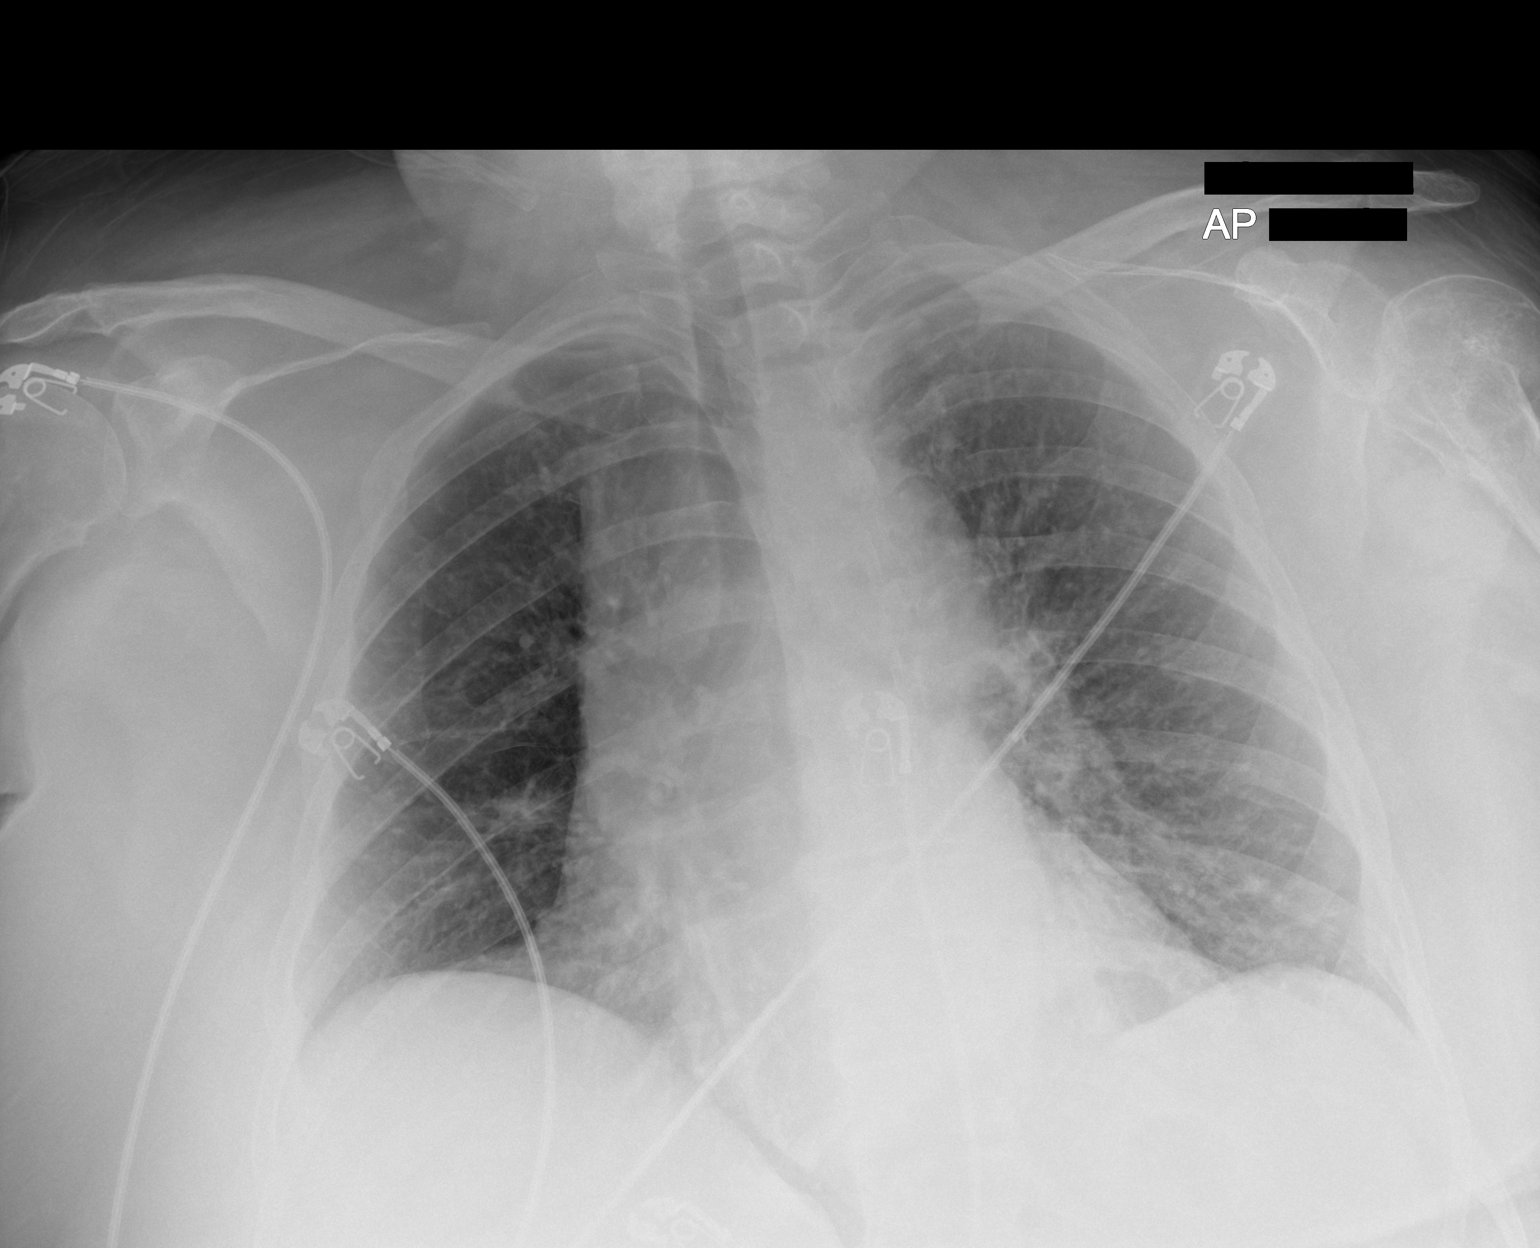

[1 of 1 positions shown; findings below may reference images not displayed]

FINDINGS: heart size is normal. Mediastinal shadows are unremarkable. Question
mild patchy infiltrate or atelectasis at the left lung base. The
remainder of the chest appears clear. No effusion.
IMPRESSION: Question mild patchy infiltrate/atelectasis at the left lung base.

## 2023-04-16 ENCOUNTER — Ambulatory Visit: Payer: Medicare PPO

## 2023-04-17 NOTE — Therapy (Signed)
OUTPATIENT PHYSICAL THERAPY BALANCE TREATMENT  Patient Name: Ariel Alvarez MRN: [id number] DOB:09-Sep-1946, 77 y.o., female Today's Date: 04/18/2023  END OF SESSION:  PT End of Session - 04/18/23 0810     Visit Number 2    Number of Visits 17    Date for PT Re-Evaluation 06/05/23    Authorization Type eval: 04/10/23    PT Start Time 0801    PT Stop Time 0845    PT Time Calculation (min) 44 min    Equipment Utilized During Treatment Gait belt    Activity Tolerance Patient limited by fatigue    Behavior During Therapy WFL for tasks assessed/performed            Past Medical History:  Diagnosis Date   Anxiety    Arthritis    RIGHT HAND   Collagen vascular disease (HCC)    Complication of anesthesia    HARD TO WAKE UP AFTER  ONE SURGERY-PT STAETS SHE WAS GIVEN TOO MUCH ANESTHESIA   Depression    Dyspnea    VERY RARE WITH EXERTION   GERD (gastroesophageal reflux disease)    History of kidney stones    H/O   Hypothyroidism    Stroke (HCC) 4098,1191   X2-LEFT HAD PARALYZED    Past Surgical History:  Procedure Laterality Date   CHOLECYSTECTOMY     KIDNEY STONE SURGERY     METATARSAL HEAD EXCISION Bilateral 04/15/2020   Procedure: METATARSAL HEAD PARTIAL EXCISION - BILATERAL;  Surgeon: Linus Galas, DPM;  Location: [facility] ORS;  Service: Podiatry;  Laterality: Bilateral;   Patient Active Problem List   Diagnosis Date Noted   Hypoxia 12/14/2022   COPD with acute bronchitis (HCC) 12/14/2022   History of pulmonary embolism 12/14/2022   Chronic anticoagulation 12/14/2022   Bleeding gums 12/14/2022   Rheumatoid arthritis (HCC) 12/06/2022   Type 2 diabetes mellitus without complication, without long-term current use of insulin (HCC) 05/21/2022   CAP (community acquired pneumonia) 01/31/2022   Diabetes (HCC) 01/31/2022   Acute respiratory failure with hypoxia (HCC) 01/31/2022   Weakness of both lower extremities 01/31/2022   Lactic acidosis 01/31/2022   Moderate episode  of recurrent major depressive disorder (HCC) 10/03/2021   Cognitive disorder 10/03/2021   Episodic mood disorder (HCC) 10/03/2021   Psychosis (HCC) 10/03/2021   Gait disturbance 12/19/2020   Rash 07/25/2020   Increased frequency of urination 07/25/2020   Closed nondisplaced fracture of proximal phalanx of left little finger with routine healing 04/07/2020   Hyperlipidemia 12/08/2019   Stroke (HCC) 12/08/2019   Ulcerated, foot, unspecified laterality, limited to breakdown of skin (HCC) 12/08/2019   Other constipation 09/28/2019   Dysphagia 09/28/2019   Pelvic pain in female 05/18/2019   Age-related osteoporosis without current pathological fracture 08/08/2018   Urinary hesitancy 10/04/2017   Routine health maintenance 08/26/2017   Candidiasis 08/11/2017   Morbid obesity with BMI of 40.0-44.9, adult (HCC) 07/30/2017   Collagen vascular disease (HCC) 05/21/2017   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) 05/21/2017   Chronic pain 05/21/2017   Cerebrovascular 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) 05/06/2017   Preoperative clearance 05/06/2017   DVT (deep venous thrombosis) (HCC) 03/24/2017   Contracture of joint of left hand 02/17/2017   Other pulmonary embolism without acute cor pulmonale (HCC) 02/06/2017   Right pontine stroke (HCC) 02/03/2017   Vasculitis (HCC) 01/29/2017   Abnormal ANCA test 12/30/2016   Right carpal tunnel syndrome 04/06/2016   GERD (gastroesophageal reflux disease)  12/31/2014   Dyslipidemia 04/06/2014   Calculus of kidney and ureter 05/06/2012   Primary insomnia 07/06/2011   Hypothyroidism 07/06/2011   Depressive disorder 07/06/2011   Spastic hemiplegia affecting nondominant side (HCC) 03/27/2011   PCP: Tawnya Crook, MD  REFERRING PROVIDER: Tawnya Crook, MD   REFERRING DIAG: [phone] (ICD-10-CM) - Personal history of transient ischemic attack (TIA), and cerebral infarction without residual deficits, R26.9 (ICD-10-CM) -  Unspecified abnormalities of gait and mobility  RATIONALE FOR EVALUATION AND TREATMENT: Rehabilitation  THERAPY DIAG: Difficulty in walking, not elsewhere classified  Muscle weakness (generalized)  ONSET DATE: 2012  FOLLOW-UP APPT SCHEDULED WITH REFERRING PROVIDER: Yes, next month  FROM INITIAL EVALUATION SUBJECTIVE:                                                                                                                                                                                         SUBJECTIVE STATEMENT:  "I want to be able to walk and stand on my own. I want to be able to do stairs."  PERTINENT HISTORY:  Pt referred for physical therapy for mobility deficits related to multiple chronic strokes with spastic hemiplegia affecting her nondominant side (LUE>LLE). She lives in a single story home (ramp entry) with her spouse. Bathroom has walk-in shower with grabs bars, built-in seat and handheld shower, and ADA height toilet w/grab bars. She currently ambulates limited distances at home with a hemiwalker. She has a history of repeated bouts of physical therapy including multiple episodes with this particular therapist. Most recently she received HH PT from October 2023 through January 2024. Her work with [facility] PT was limited due to complaints of foot pain and intolerance to activity. She did not reach her goals and pt states that she has not been consistent with her HEP since discharge. Per patient "they didn't do anything for me." Her HH PT notes are very detailed and it appears that patient's limitations are related to medical complications, pain, and a lack of compliance. She made very little progress improving from maxA with most mobility to modA. This is consistent with her prior episodes with this therapy where little to no progress was achieved. Pt denies an recent change in her baseline function. Pt stated goals are, "I want to be able to walk and stand on my own. I want to be able  to do stairs." These have been her goals for many years and she has been unable to achieve them in the past. Her medical history is complex and her PMH includes morbid obesity, RA, COPD, DM, peripheral neuropathy, MDD, mood disorder, cognitive disorder, dysphagia, vasculitis, osteoporosis, CVA, and chronic pain among other  ongoing medical issues.    Pain: No Numbness/Tingling: Yes, numbness in LUE/LLE, Hx of peripheral neuropathy; Focal Weakness: Yes, L side weakness since CVA  Recent changes in overall health/medication: No Prior history of physical therapy for balance:  Yes, extensive history of outpatient and HH PT. Dominant hand: right Red flags: Negative for bowel/bladder changes, saddle paresthesia, personal history of cancer, h/o spinal tumors, h/o compression fx, h/o abdominal aneurysm, abdominal pain, chills/fever, night sweats, nausea, vomiting, unrelenting pain  PRECAUTIONS: None  WEIGHT BEARING RESTRICTIONS: No  FALLS: Has patient fallen in last 6 months? Yes. Number of falls 1  Living Environment Lives with: lives with an adult companion "significant other x 30 years" Lives in: House/apartment, one level; Stairs:  4 steps to enter, pt uses ramp; Has following equipment at home: Hemi walker, bed side commode, and walkin-shower with grab bars and seat;  Prior level of function: Needs assistance with ADLs and Needs assistance with homemaking  Occupational demands: Not working  Hobbies: Reading  Patient Goals: "I want to be able to walk and stand on my own. I want to be able to do stairs."   OBJECTIVE:   Patient Surveys  FOTO: 32, predicted improvement to 33 ABC: To be completed  Cognition Patient is oriented to person, place, and time.  Recent memory is intact.  Remote memory is intact.  Attention span and concentration are intact.  Expressive speech is intact.  Patient's fund of knowledge is within normal limits for educational level. She appears to have poor  insight into her potential to improve given failure to improve with physical therapy over multiple years and multiple episodes of care.    Gross Musculoskeletal Assessment Tremor: None Bulk: Decreased muscle bulk in LUE Tone: Spastic hemiplegia LUE>LLE;  Posture: Hemplegic LUE resting at patient's side;  AROM Deferred  LE MMT: MMT (out of 5) Right  Left   Hip flexion 5 3+  Hip extension    Hip abduction    Hip adduction    Hip internal rotation    Hip external rotation    Knee flexion 5 4+  Knee extension 5 3+  Ankle dorsiflexion 5 3+  (* = pain; Blank rows = not tested)  Sensation Diminished throughout entire LUE/LLE to light touch. Proprioception, stereognosis, and hot/cold testing deferred on this date.  Reflexes Deferred  Cranial Nerves Deferred  Coordination/Cerebellar Deferred  Bed mobility: Not tested  Transfers: Assistive device utilized: Hemi walker  Sit to stand: Min A Stand to sit: Mod A Chair to chair:  ModA+1 to transfer chair to chair; Floor:  Deferred  Curb:  Curb Comments: Deferred  Stairs: Comments: Unsafe to attempt  Gait: Gait pattern: step to pattern, decreased step length- Right, decreased step length- Left, decreased stance time- Right, decreased stance time- Left, and decreased ankle dorsiflexion- Left Distance walked:  Assistive device utilized: Hemi walker Level of assistance: Modified independence Comments: Pt ambulates very slowly with hemiwalker in RUE and step-to pattern. Spasticity noted in L ankle with absent heel strike and limited dorsiflexion range of motion;  Functional Outcome Measures  Results Comments  BERG 9/56 High risk of falls  TUG Unsafe to attempt   5TSTS Unable   2/6 Minute Walk Test Unable to attempt   10' walk speed Fastest: 47.8s = 0.06 m/s Very slow and not functional for household or community mobility   (Blank rows = not tested)   TODAY'S TREATMENT    SUBJECTIVE: Pt reports that she is  doing alright today. Energy  is slightly improved today compared to the initial evaluation. She has chronic neuropathy that causes her a lot of pain. No specific questions currently   PAIN: BLE neuropathy (chronic)   Ther-ex NuStep L0 x 5 minutes for warm-up during interval history, therapist monitoring throughout, rest break required at 2:30; : 59' with hemi-walker in RUE, multiple seated rest breaks required; Pre-activity vitals: 123/67 mmHg, HR: 79 bpm, SpO2: 99% Post-activity vitals: unable to test, pt is sitting for last 45s of test  Seated marches x 10 BLE; Seated clams with manual resistance x 10 BLE; Seated adductor squeezes with manual resistance x 10 BLE; Seated LAQ with manual resistance x 10 BLE; Seated hamstring curls x 10 BLE;   PATIENT EDUCATION:  Education details: Pt educated throughout session about proper posture and technique with exercises. Improved exercise technique, movement at target joints, use of target muscles after min to mod verbal, visual, tactile cues. Prognosis. Continue current HEP issued from [facility] PT Person educated: Patient Education method: Explanation, Demonstration, and Verbal cues Education comprehension: verbalized understanding and returned demonstration   HOME EXERCISE PROGRAM:  Continue current program as issued by [facility] PT   ASSESSMENT:  CLINICAL IMPRESSION: Performed with pt at start of session and she ambulates 73' with hemi-walker in RUE. She requires multiple seated rest breaks throughout distance due to fatigue. Also initiated strengthening with the NuStep and in sitting. Reinforced realistic expectations for minimal functional improvement from therapy given chronicity of stroke and multiple bouts of physical therapy without change. Pt continues to ruminate on the possibility of using the body weight support equipment at the main hospital. Encouraged her to continue the HEP issued by [facility] PT and she states, "I do it once, sometimes  twice, a day but it doesn't do anything." Pt encouraged to follow-up as scheduled. She will benefit from a trial of PT for strength, balance, and mobility to determine whether it will make functional improvement.  OBJECTIVE IMPAIRMENTS: Abnormal gait, decreased activity tolerance, decreased balance, decreased knowledge of condition, decreased mobility, difficulty walking, decreased strength, decreased safety awareness, impaired tone, impaired UE functional use, obesity, and pain.   ACTIVITY LIMITATIONS: carrying, lifting, bending, standing, squatting, stairs, transfers, bed mobility, bathing, toileting, and dressing  PARTICIPATION LIMITATIONS: meal prep, cleaning, laundry, medication management, driving, shopping, and community activity  PERSONAL FACTORS: Age, Behavior pattern, Past/current experiences, Time since onset of injury/illness/exacerbation, and 3+ comorbidities: DM, OP, CVA, neuropathy, cognitive impairment, MDD, mood disorder, and chronic pain  are also affecting patient's functional outcome.   REHAB POTENTIAL: Poor prior failed episodes of care  CLINICAL DECISION MAKING: Unstable/unpredictable  EVALUATION COMPLEXITY: High   GOALS: Goals reviewed with patient? No  SHORT TERM GOALS: Target date: 05/08/2023  Pt will be independent with HEP in order to improve strength and balance in order to decrease fall risk and improve function at home. Baseline:  Goal status: INITIAL   LONG TERM GOALS: Target date: 06/05/2023  Pt will increase FOTO to at least 43 to demonstrate significant improvement in function at home related to balance  Baseline: 04/10/23: 32 Goal status: INITIAL  2.  Pt will improve BERG by at least 3 points in order to demonstrate clinically significant improvement in balance.   Baseline: 04/10/23: 9/56 Goal status: INITIAL  3.  Pt will improve ABC by at least 13% in order to demonstrate clinically significant improvement in balance confidence.      Baseline:  04/10/23: To be completed Goal status: INITIAL  4. Pt will increase by  at least 0.13 m/s in order to demonstrate clinically significant improvement in household ambulation.       Baseline: 04/10/23: 0.06 m/s Goal status: INITIAL   PLAN: PT FREQUENCY: 2x/week  PT DURATION: 8 weeks  PLANNED INTERVENTIONS: Therapeutic exercises, Therapeutic activity, Neuromuscular re-education, Balance training, Gait training, Patient/Family education, Self Care, Joint mobilization, Joint manipulation, Vestibular training, Canalith repositioning, Orthotic/Fit training, DME instructions, Dry Needling, Electrical stimulation, Spinal manipulation, Spinal mobilization, Cryotherapy, Moist heat, Taping, Traction, Ultrasound, Ionotophoresis 4mg /ml Dexamethasone, Manual therapy, and Re-evaluation.  PLAN FOR NEXT SESSION: complete ABC, Two minute walk test with w/c follow, initiate strengthening and issue HEP;   Sharalyn Ink Tallen Schnorr PT, DPT, GCS  Fallou Hulbert 04/18/2023, 10:58 AM

## 2023-04-18 ENCOUNTER — Ambulatory Visit: Payer: Medicare PPO

## 2023-04-18 DIAGNOSIS — R262 Difficulty in walking, not elsewhere classified: Secondary | ICD-10-CM

## 2023-04-18 DIAGNOSIS — M6281 Muscle weakness (generalized): Secondary | ICD-10-CM

## 2023-04-23 ENCOUNTER — Ambulatory Visit: Payer: Medicare PPO

## 2023-04-23 DIAGNOSIS — M6281 Muscle weakness (generalized): Secondary | ICD-10-CM

## 2023-04-23 DIAGNOSIS — R262 Difficulty in walking, not elsewhere classified: Secondary | ICD-10-CM

## 2023-04-23 NOTE — Therapy (Addendum)
OUTPATIENT PHYSICAL THERAPY BALANCE TREATMENT  Patient Name: Ariel Alvarez MRN: [id number] DOB:1946/08/01, 77 y.o., female Today's Date: 04/25/2023  END OF SESSION:    Past Medical History:  Diagnosis Date   Anxiety    Arthritis    RIGHT HAND   Collagen vascular disease (HCC)    Complication of anesthesia    HARD TO WAKE UP AFTER  ONE SURGERY-PT STAETS SHE WAS GIVEN TOO MUCH ANESTHESIA   Depression    Dyspnea    VERY RARE WITH EXERTION   GERD (gastroesophageal reflux disease)    History of kidney stones    H/O   Hypothyroidism    Stroke (HCC) 6440,3474   X2-LEFT HAD PARALYZED    Past Surgical History:  Procedure Laterality Date   CHOLECYSTECTOMY     KIDNEY STONE SURGERY     METATARSAL HEAD EXCISION Bilateral 04/15/2020   Procedure: METATARSAL HEAD PARTIAL EXCISION - BILATERAL;  Surgeon: Linus Galas, DPM;  Location: [facility] ORS;  Service: Podiatry;  Laterality: Bilateral;   Patient Active Problem List   Diagnosis Date Noted   Hypoxia 12/14/2022   COPD with acute bronchitis (HCC) 12/14/2022   History of pulmonary embolism 12/14/2022   Chronic anticoagulation 12/14/2022   Bleeding gums 12/14/2022   Rheumatoid arthritis (HCC) 12/06/2022   Type 2 diabetes mellitus without complication, without long-term current use of insulin (HCC) 05/21/2022   CAP (community acquired pneumonia) 01/31/2022   Diabetes (HCC) 01/31/2022   Acute respiratory failure with hypoxia (HCC) 01/31/2022   Weakness of both lower extremities 01/31/2022   Lactic acidosis 01/31/2022   Moderate episode of recurrent major depressive disorder (HCC) 10/03/2021   Cognitive disorder 10/03/2021   Episodic mood disorder (HCC) 10/03/2021   Psychosis (HCC) 10/03/2021   Gait disturbance 12/19/2020   Rash 07/25/2020   Increased frequency of urination 07/25/2020   Closed nondisplaced fracture of proximal phalanx of left little finger with routine healing 04/07/2020   Hyperlipidemia 12/08/2019   Stroke (HCC)  12/08/2019   Ulcerated, foot, unspecified laterality, limited to breakdown of skin (HCC) 12/08/2019   Other constipation 09/28/2019   Dysphagia 09/28/2019   Pelvic pain in female 05/18/2019   Age-related osteoporosis without current pathological fracture 08/08/2018   Urinary hesitancy 10/04/2017   Routine health maintenance 08/26/2017   Candidiasis 08/11/2017   Morbid obesity with BMI of 40.0-44.9, adult (HCC) 07/30/2017   Collagen vascular disease (HCC) 05/21/2017   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) 05/21/2017   Chronic pain 05/21/2017   Cerebrovascular 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) 05/06/2017   Preoperative clearance 05/06/2017   DVT (deep venous thrombosis) (HCC) 03/24/2017   Contracture of joint of left hand 02/17/2017   Other pulmonary embolism without acute cor pulmonale (HCC) 02/06/2017   Right pontine stroke (HCC) 02/03/2017   Vasculitis (HCC) 01/29/2017   Abnormal ANCA test 12/30/2016   Right carpal tunnel syndrome 04/06/2016   GERD (gastroesophageal reflux disease) 12/31/2014   Dyslipidemia 04/06/2014   Calculus of kidney and ureter 05/06/2012   Primary insomnia 07/06/2011   Hypothyroidism 07/06/2011   Depressive disorder 07/06/2011   Spastic hemiplegia affecting nondominant side (HCC) 03/27/2011   PCP: Tawnya Crook, MD  REFERRING PROVIDER: Tawnya Crook, MD   REFERRING DIAG: Z86.73 (ICD-10-CM) - Personal history of transient ischemic attack (TIA), and cerebral infarction without residual deficits, R26.9 (ICD-10-CM) - Unspecified abnormalities of gait and mobility  RATIONALE FOR EVALUATION AND TREATMENT: Rehabilitation  THERAPY DIAG: Difficulty in walking, not elsewhere classified  Muscle weakness (  generalized)  ONSET DATE: 2012  FOLLOW-UP APPT SCHEDULED WITH REFERRING PROVIDER: Yes, next month  FROM INITIAL EVALUATION SUBJECTIVE:                                                                                                                                                                                          SUBJECTIVE STATEMENT:  "I want to be able to walk and stand on my own. I want to be able to do stairs."  PERTINENT HISTORY:  Pt referred for physical therapy for mobility deficits related to multiple chronic strokes with spastic hemiplegia affecting her nondominant side (LUE>LLE). She lives in a single story home (ramp entry) with her spouse. Bathroom has walk-in shower with grabs bars, built-in seat and handheld shower, and ADA height toilet w/grab bars. She currently ambulates limited distances at home with a hemiwalker. She has a history of repeated bouts of physical therapy including multiple episodes with this particular therapist. Most recently she received HH PT from October 2023 through January 2024. Her work with [facility] PT was limited due to complaints of foot pain and intolerance to activity. She did not reach her goals and pt states that she has not been consistent with her HEP since discharge. Per patient "they didn't do anything for me." Her HH PT notes are very detailed and it appears that patient's limitations are related to medical complications, pain, and a lack of compliance. She made very little progress improving from maxA with most mobility to modA. This is consistent with her prior episodes with this therapy where little to no progress was achieved. Pt denies an recent change in her baseline function. Pt stated goals are, "I want to be able to walk and stand on my own. I want to be able to do stairs." These have been her goals for many years and she has been unable to achieve them in the past. Her medical history is complex and her PMH includes morbid obesity, RA, COPD, DM, peripheral neuropathy, MDD, mood disorder, cognitive disorder, dysphagia, vasculitis, osteoporosis, CVA, and chronic pain among other ongoing medical issues.    Pain: No Numbness/Tingling: Yes, numbness in LUE/LLE, Hx of  peripheral neuropathy; Focal Weakness: Yes, L side weakness since CVA  Recent changes in overall health/medication: No Prior history of physical therapy for balance:  Yes, extensive history of outpatient and HH PT. Dominant hand: right Red flags: Negative for bowel/bladder changes, saddle paresthesia, personal history of cancer, h/o spinal tumors, h/o compression fx, h/o abdominal aneurysm, abdominal pain, chills/fever, night sweats, nausea, vomiting, unrelenting pain  PRECAUTIONS: None  WEIGHT BEARING RESTRICTIONS: No  FALLS: Has patient fallen in last  6 months? Yes. Number of falls 1  Living Environment Lives with: lives with an adult companion "significant other x 30 years" Lives in: House/apartment, one level; Stairs:  4 steps to enter, pt uses ramp; Has following equipment at home: Hemi walker, bed side commode, and walkin-shower with grab bars and seat;  Prior level of function: Needs assistance with ADLs and Needs assistance with homemaking  Occupational demands: Not working  Hobbies: Reading  Patient Goals: "I want to be able to walk and stand on my own. I want to be able to do stairs."   OBJECTIVE:   Patient Surveys  FOTO: 32, predicted improvement to 23 ABC: To be completed  Cognition Patient is oriented to person, place, and time.  Recent memory is intact.  Remote memory is intact.  Attention span and concentration are intact.  Expressive speech is intact.  Patient's fund of knowledge is within normal limits for educational level. She appears to have poor insight into her potential to improve given failure to improve with physical therapy over multiple years and multiple episodes of care.    Gross Musculoskeletal Assessment Tremor: None Bulk: Decreased muscle bulk in LUE Tone: Spastic hemiplegia LUE>LLE;  Posture: Hemplegic LUE resting at patient's side;  AROM Deferred  LE MMT: MMT (out of 5) Right  Left   Hip flexion 5 3+  Hip extension    Hip  abduction    Hip adduction    Hip internal rotation    Hip external rotation    Knee flexion 5 4+  Knee extension 5 3+  Ankle dorsiflexion 5 3+  (* = pain; Blank rows = not tested)  Sensation Diminished throughout entire LUE/LLE to light touch. Proprioception, stereognosis, and hot/cold testing deferred on this date.  Reflexes Deferred  Cranial Nerves Deferred  Coordination/Cerebellar Deferred  Bed mobility: Not tested  Transfers: Assistive device utilized: Hemi walker  Sit to stand: Min A Stand to sit: Mod A Chair to chair:  ModA+1 to transfer chair to chair; Floor:  Deferred  Curb:  Curb Comments: Deferred  Stairs: Comments: Unsafe to attempt  Gait: Gait pattern: step to pattern, decreased step length- Right, decreased step length- Left, decreased stance time- Right, decreased stance time- Left, and decreased ankle dorsiflexion- Left Distance walked:  Assistive device utilized: Hemi walker Level of assistance: Modified independence Comments: Pt ambulates very slowly with hemiwalker in RUE and step-to pattern. Spasticity noted in L ankle with absent heel strike and limited dorsiflexion range of motion;  Functional Outcome Measures  Results Comments  BERG 9/56 High risk of falls  TUG Unsafe to attempt   5TSTS Unable   2/6 Minute Walk Test Unable to attempt   10' walk speed Fastest: 47.8s = 0.06 m/s Very slow and not functional for household or community mobility   (Blank rows = not tested)   TODAY'S TREATMENT    SUBJECTIVE: Pt continues to report that she is doing alright today. Energy is slightly improved today compared to the initial evaluation. She has chronic neuropathy that causes her a lot of pain. No specific questions currently   PAIN: BLE neuropathy (chronic)   Ther-ex Pt remained asymptomatic through out the session:  NuStep L0 x 8 minutes for warm-up during interval history, therapist monitoring throughout, rest break required at  2:30; : 76' with hemi-walker in RUE, multiple seated rest breaks required; Seated marches x 10 BLE; Seated clams with manual resistance x 10 BLE; STS with Bal squeeze 2 x 10 reps Gait training  with HW 2 x 64ft    PATIENT EDUCATION:  Education details: Pt educated throughout session about proper posture and technique with exercises. Improved exercise technique, movement at target joints, use of target muscles after min to mod verbal, visual, tactile cues. Prognosis. Continue current HEP issued from [facility] PT Person educated: Patient Education method: Explanation, Demonstration, and Verbal cues Education comprehension: verbalized understanding and returned demonstration   HOME EXERCISE PROGRAM:  Continue current program as issued by [facility] PT   ASSESSMENT:  CLINICAL IMPRESSION: Pt. demsontrates endurance, balance and strength deficits limiting pt with  all activities. Pt compliance ot HEP is questionable.PT provided education of significance of movement at home to improve rehab outcome. Pt advised to walk with Ad during the day at home and to perform STS from a Firm surface 10 times every 4 hours. Pt and spouse demonstrated good understanding. PT continued to challenge  Pt with endurance, strengthening and functional activities. Pt tires easily requiring rest breaks. Pt tol Tx well.   encouraged to follow-up as scheduled. She will benefit from a trial of PT for strength, balance, and mobility to determine whether it will make functional improvement.  OBJECTIVE IMPAIRMENTS: Abnormal gait, decreased activity tolerance, decreased balance, decreased knowledge of condition, decreased mobility, difficulty walking, decreased strength, decreased safety awareness, impaired tone, impaired UE functional use, obesity, and pain.   ACTIVITY LIMITATIONS: carrying, lifting, bending, standing, squatting, stairs, transfers, bed mobility, bathing, toileting, and dressing  PARTICIPATION LIMITATIONS: meal prep,  cleaning, laundry, medication management, driving, shopping, and community activity  PERSONAL FACTORS: Age, Behavior pattern, Past/current experiences, Time since onset of injury/illness/exacerbation, and 3+ comorbidities: DM, OP, CVA, neuropathy, cognitive impairment, MDD, mood disorder, and chronic pain  are also affecting patient's functional outcome.   REHAB POTENTIAL: Poor prior failed episodes of care  CLINICAL DECISION MAKING: Unstable/unpredictable  EVALUATION COMPLEXITY: High   GOALS: Goals reviewed with patient? No  SHORT TERM GOALS: Target date: 05/08/2023  Pt will be independent with HEP in order to improve strength and balance in order to decrease fall risk and improve function at home. Baseline:  Goal status: INITIAL   LONG TERM GOALS: Target date: 06/05/2023  Pt will increase FOTO to at least 43 to demonstrate significant improvement in function at home related to balance  Baseline: 04/10/23: 32 Goal status: INITIAL  2.  Pt will improve BERG by at least 3 points in order to demonstrate clinically significant improvement in balance.   Baseline: 04/10/23: 9/56 Goal status: INITIAL  3.  Pt will improve ABC by at least 13% in order to demonstrate clinically significant improvement in balance confidence.      Baseline: 04/10/23: To be completed Goal status: INITIAL  4. Pt will increase by at least 0.13 m/s in order to demonstrate clinically significant improvement in household ambulation.       Baseline: 04/10/23: 0.06 m/s Goal status: INITIAL   PLAN: PT FREQUENCY: 2x/week  PT DURATION: 8 weeks  PLANNED INTERVENTIONS: Therapeutic exercises, Therapeutic activity, Neuromuscular re-education, Balance training, Gait training, Patient/Family education, Self Care, Joint mobilization, Joint manipulation, Vestibular training, Canalith repositioning, Orthotic/Fit training, DME instructions, Dry Needling, Electrical stimulation, Spinal manipulation, Spinal mobilization,  Cryotherapy, Moist heat, Taping, Traction, Ultrasound, Ionotophoresis 4mg /ml Dexamethasone, Manual therapy, and Re-evaluation.  PLAN FOR NEXT SESSION: complete ABC, Two minute walk test with w/c follow, initiate strengthening and issue HEP;  Janet Berlin PT DPT 9:22 AM,04/25/23 Chancy Smigiel H Jemery Stacey 04/25/2023, 9:22 AM

## 2023-04-25 ENCOUNTER — Ambulatory Visit: Payer: Medicare PPO

## 2023-04-25 DIAGNOSIS — M6281 Muscle weakness (generalized): Secondary | ICD-10-CM

## 2023-04-25 DIAGNOSIS — R262 Difficulty in walking, not elsewhere classified: Secondary | ICD-10-CM | POA: Diagnosis not present

## 2023-04-25 NOTE — Therapy (Signed)
OUTPATIENT PHYSICAL THERAPY BALANCE TREATMENT  Patient Name: Ariel Alvarez MRN: [id number] DOB:[birth year], 77 y.o., female Today's Date: 04/25/2023  END OF SESSION:  PT End of Session - 04/25/23 0917     Visit Number 4    Number of Visits 17    Date for PT Re-Evaluation 06/05/23    PT Start Time 0800    PT Stop Time 0847    PT Time Calculation (min) 47 min              Past Medical History:  Diagnosis Date   Anxiety    Arthritis    RIGHT HAND   Collagen vascular disease (HCC)    Complication of anesthesia    HARD TO WAKE UP AFTER  ONE SURGERY-PT STAETS SHE WAS GIVEN TOO MUCH ANESTHESIA   Depression    Dyspnea    VERY RARE WITH EXERTION   GERD (gastroesophageal reflux disease)    History of kidney stones    H/O   Hypothyroidism    Stroke (HCC) 7829,5621   X2-LEFT HAD PARALYZED    Past Surgical History:  Procedure Laterality Date   CHOLECYSTECTOMY     KIDNEY STONE SURGERY     METATARSAL HEAD EXCISION Bilateral 04/15/2020   Procedure: METATARSAL HEAD PARTIAL EXCISION - BILATERAL;  Surgeon: Linus Galas, DPM;  Location: [facility] ORS;  Service: Podiatry;  Laterality: Bilateral;   Patient Active Problem List   Diagnosis Date Noted   Hypoxia 12/14/2022   COPD with acute bronchitis (HCC) 12/14/2022   History of pulmonary embolism 12/14/2022   Chronic anticoagulation 12/14/2022   Bleeding gums 12/14/2022   Rheumatoid arthritis (HCC) 12/06/2022   Type 2 diabetes mellitus without complication, without long-term current use of insulin (HCC) 05/21/2022   CAP (community acquired pneumonia) 01/31/2022   Diabetes (HCC) 01/31/2022   Acute respiratory failure with hypoxia (HCC) 01/31/2022   Weakness of both lower extremities 01/31/2022   Lactic acidosis 01/31/2022   Moderate episode of recurrent major depressive disorder (HCC) 10/03/2021   Cognitive disorder 10/03/2021   Episodic mood disorder (HCC) 10/03/2021   Psychosis (HCC) 10/03/2021   Gait disturbance 12/19/2020    Rash 07/25/2020   Increased frequency of urination 07/25/2020   Closed nondisplaced fracture of proximal phalanx of left little finger with routine healing 04/07/2020   Hyperlipidemia 12/08/2019   Stroke (HCC) 12/08/2019   Ulcerated, foot, unspecified laterality, limited to breakdown of skin (HCC) 12/08/2019   Other constipation 09/28/2019   Dysphagia 09/28/2019   Pelvic pain in female 05/18/2019   Age-related osteoporosis without current pathological fracture 08/08/2018   Urinary hesitancy 10/04/2017   Routine health maintenance 08/26/2017   Candidiasis 08/11/2017   Morbid obesity with BMI of 40.0-44.9, adult (HCC) 07/30/2017   Collagen vascular disease (HCC) 05/21/2017   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) 05/21/2017   Chronic pain 05/21/2017   Cerebrovascular 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) 05/06/2017   Preoperative clearance 05/06/2017   DVT (deep venous thrombosis) (HCC) 03/24/2017   Contracture of joint of left hand 02/17/2017   Other pulmonary embolism without acute cor pulmonale (HCC) 02/06/2017   Right pontine stroke (HCC) 02/03/2017   Vasculitis (HCC) 01/29/2017   Abnormal ANCA test 12/30/2016   Right carpal tunnel syndrome 04/06/2016   GERD (gastroesophageal reflux disease) 12/31/2014   Dyslipidemia 04/06/2014   Calculus of kidney and ureter 05/06/2012   Primary insomnia 07/06/2011   Hypothyroidism 07/06/2011   Depressive disorder 07/06/2011   Spastic hemiplegia affecting nondominant  side (HCC) 03/27/2011   PCP: Tawnya Crook, MD  REFERRING PROVIDER: Tawnya Crook, MD   REFERRING DIAG: [phone] (ICD-10-CM) - Personal history of transient ischemic attack (TIA), and cerebral infarction without residual deficits, R26.9 (ICD-10-CM) - Unspecified abnormalities of gait and mobility  RATIONALE FOR EVALUATION AND TREATMENT: Rehabilitation      THERAPY DIAG: Difficulty in walking, not elsewhere classified  Muscle weakness  (generalized)  ONSET DATE: 2012  FOLLOW-UP APPT SCHEDULED WITH REFERRING PROVIDER: Yes, next month  FROM INITIAL EVALUATION SUBJECTIVE:                                                                                                                                                                                         SUBJECTIVE STATEMENT:  "I want to be able to walk and stand on my own. I want to be able to do stairs."  PERTINENT HISTORY:  Pt referred for physical therapy for mobility deficits related to multiple chronic strokes with spastic hemiplegia affecting her nondominant side (LUE>LLE). She lives in a single story home (ramp entry) with her spouse. Bathroom has walk-in shower with grabs bars, built-in seat and handheld shower, and ADA height toilet w/grab bars. She currently ambulates limited distances at home with a hemiwalker. She has a history of repeated bouts of physical therapy including multiple episodes with this particular therapist. Most recently she received HH PT from October 2023 through January 2024. Her work with [facility] PT was limited due to complaints of foot pain and intolerance to activity. She did not reach her goals and pt states that she has not been consistent with her HEP since discharge. Per patient "they didn't do anything for me." Her HH PT notes are very detailed and it appears that patient's limitations are related to medical complications, pain, and a lack of compliance. She made very little progress improving from maxA with most mobility to modA. This is consistent with her prior episodes with this therapy where little to no progress was achieved. Pt denies an recent change in her baseline function. Pt stated goals are, "I want to be able to walk and stand on my own. I want to be able to do stairs." These have been her goals for many years and she has been unable to achieve them in the past. Her medical history is complex and her PMH includes morbid obesity, RA, COPD,  DM, peripheral neuropathy, MDD, mood disorder, cognitive disorder, dysphagia, vasculitis, osteoporosis, CVA, and chronic pain among other ongoing medical issues.    Pain: No Numbness/Tingling: Yes, numbness in LUE/LLE, Hx of peripheral neuropathy; Focal Weakness: Yes, L side weakness since CVA  Recent changes in  overall health/medication: No Prior history of physical therapy for balance:  Yes, extensive history of outpatient and HH PT. Dominant hand: right Red flags: Negative for bowel/bladder changes, saddle paresthesia, personal history of cancer, h/o spinal tumors, h/o compression fx, h/o abdominal aneurysm, abdominal pain, chills/fever, night sweats, nausea, vomiting, unrelenting pain  PRECAUTIONS: None  WEIGHT BEARING RESTRICTIONS: No  FALLS: Has patient fallen in last 6 months? Yes. Number of falls 1  Living Environment Lives with: lives with an adult companion "significant other x 30 years" Lives in: House/apartment, one level; Stairs:  4 steps to enter, pt uses ramp; Has following equipment at home: Hemi walker, bed side commode, and walkin-shower with grab bars and seat;  Prior level of function: Needs assistance with ADLs and Needs assistance with homemaking  Occupational demands: Not working  Hobbies: Reading  Patient Goals: "I want to be able to walk and stand on my own. I want to be able to do stairs."   OBJECTIVE:   Patient Surveys  FOTO: 32, predicted improvement to 6 ABC: To be completed  Cognition Patient is oriented to person, place, and time.  Recent memory is intact.  Remote memory is intact.  Attention span and concentration are intact.  Expressive speech is intact.  Patient's fund of knowledge is within normal limits for educational level. She appears to have poor insight into her potential to improve given failure to improve with physical therapy over multiple years and multiple episodes of care.    Gross Musculoskeletal Assessment Tremor:  None Bulk: Decreased muscle bulk in LUE Tone: Spastic hemiplegia LUE>LLE;  Posture: Hemplegic LUE resting at patient's side;  AROM Deferred  LE MMT: MMT (out of 5) Right  Left   Hip flexion 5 3+  Hip extension    Hip abduction    Hip adduction    Hip internal rotation    Hip external rotation    Knee flexion 5 4+  Knee extension 5 3+  Ankle dorsiflexion 5 3+  (* = pain; Blank rows = not tested)  Sensation Diminished throughout entire LUE/LLE to light touch. Proprioception, stereognosis, and hot/cold testing deferred on this date.  Reflexes Deferred  Cranial Nerves Deferred  Coordination/Cerebellar Deferred  Bed mobility: Not tested  Transfers: Assistive device utilized: Hemi walker  Sit to stand: Min A Stand to sit: Mod A Chair to chair:  ModA+1 to transfer chair to chair; Floor:  Deferred  Curb:  Curb Comments: Deferred  Stairs: Comments: Unsafe to attempt  Gait: Gait pattern: step to pattern, decreased step length- Right, decreased step length- Left, decreased stance time- Right, decreased stance time- Left, and decreased ankle dorsiflexion- Left Distance walked:  Assistive device utilized: Hemi walker Level of assistance: Modified independence Comments: Pt ambulates very slowly with hemiwalker in RUE and step-to pattern. Spasticity noted in L ankle with absent heel strike and limited dorsiflexion range of motion;  Functional Outcome Measures  Results Comments  BERG 9/56 High risk of falls  TUG Unsafe to attempt   5TSTS Unable   2/6 Minute Walk Test Unable to attempt   10' walk speed Fastest: 47.8s = 0.06 m/s Very slow and not functional for household or community mobility   (Blank rows = not tested)   TODAY'S TREATMENT    SUBJECTIVE: Pt reported of having spasm in BLE and is tired since yesterday. Pt motivated to continue with session.  She has chronic neuropathy that causes her a lot of pain. No specific questions currently   PAIN: BLE  neuropathy (chronic)   Ther-ex Pt remained asymptomatic through out the session:  NuStep L0 x 6 minutes for warm-up during interval history, therapist monitoring throughout, rest break required at 2:30; Ambulation 20 ft x 1, 10 ft x 1 with CGA of 1 with HW.  Transfers with CGA to min assist.  Sit <->supine with mod assist to LLE and min assist to RLE.  STM to B quads, Adductors and Abductors.  Passive hamstring and gluet stretches Bridges 2 x 10 reps SLR 2 x 10 reps ( Manual cues to L for smooth movement) AAROM Heel slides 1 x 10 reps  STS x 5 reps.    PATIENT EDUCATION:  Education details: Pt educated throughout session about proper posture and technique with exercises. Improved exercise technique, movement at target joints, use of target muscles after min to mod verbal, visual, tactile cues. Prognosis. Continue current HEP issued from [facility] PT Person educated: Patient Education method: Explanation, Demonstration, and Verbal cues Education comprehension: verbalized understanding and returned demonstration   HOME EXERCISE PROGRAM:  Continue current program as issued by [facility]   ASSESSMENT:  CLINICAL IMPRESSION: Pt. Cont demonstrates endurance, balance and strength deficits limiting pt with  all activities. Pt educated about fluctuating endurance for that reason it is OK to listen to body to continue with daily activities. To take frequent rest breaks to prevent fatigue. Pt's peripheral neuropathy also increases pain and spasm in BLE. Pt compliance ot HEP is questionable.PT provided education of significance of movement at home to improve rehab outcome.  Pt and spouse demonstrated good understanding. PT continued to challenge  Pt with endurance, strengthening and functional activities with frequent rest breaks. Pt tires easily requiring rest breaks. Pt tol Tx well.   encouraged to follow-up as scheduled. She will benefit from a trial of PT for strength, balance, and mobility to  determine whether it will make functional improvement.  OBJECTIVE IMPAIRMENTS: Abnormal gait, decreased activity tolerance, decreased balance, decreased knowledge of condition, decreased mobility, difficulty walking, decreased strength, decreased safety awareness, impaired tone, impaired UE functional use, obesity, and pain.   ACTIVITY LIMITATIONS: carrying, lifting, bending, standing, squatting, stairs, transfers, bed mobility, bathing, toileting, and dressing  PARTICIPATION LIMITATIONS: meal prep, cleaning, laundry, medication management, driving, shopping, and community activity  PERSONAL FACTORS: Age, Behavior pattern, Past/current experiences, Time since onset of injury/illness/exacerbation, and 3+ comorbidities: DM, OP, CVA, neuropathy, cognitive impairment, MDD, mood disorder, and chronic pain  are also affecting patient's functional outcome.   REHAB POTENTIAL: Poor prior failed episodes of care  CLINICAL DECISION MAKING: Unstable/unpredictable  EVALUATION COMPLEXITY: High   GOALS: Goals reviewed with patient? No  SHORT TERM GOALS: Target date: 05/08/2023  Pt will be independent with HEP in order to improve strength and balance in order to decrease fall risk and improve function at home. Baseline:  Goal status: INITIAL   LONG TERM GOALS: Target date: 06/05/2023  Pt will increase FOTO to at least 43 to demonstrate significant improvement in function at home related to balance  Baseline: 04/10/23: 32 Goal status: INITIAL  2.  Pt will improve BERG by at least 3 points in order to demonstrate clinically significant improvement in balance.   Baseline: 04/10/23: 9/56 Goal status: INITIAL  3.  Pt will improve ABC by at least 13% in order to demonstrate clinically significant improvement in balance confidence.      Baseline: 04/10/23: To be completed Goal status: INITIAL  4. Pt will increase by at least 0.13 m/s in order to  demonstrate clinically significant improvement in  household ambulation.       Baseline: 04/10/23: 0.06 m/s Goal status: INITIAL   PLAN: PT FREQUENCY: 2x/week  PT DURATION: 8 weeks  PLANNED INTERVENTIONS: Therapeutic exercises, Therapeutic activity, Neuromuscular re-education, Balance training, Gait training, Patient/Family education, Self Care, Joint mobilization, Joint manipulation, Vestibular training, Canalith repositioning, Orthotic/Fit training, DME instructions, Dry Needling, Electrical stimulation, Spinal manipulation, Spinal mobilization, Cryotherapy, Moist heat, Taping, Traction, Ultrasound, Ionotophoresis 4mg /ml Dexamethasone, Manual therapy, and Re-evaluation.  PLAN FOR NEXT SESSION: complete ABC, Two minute walk test with w/c follow, initiate strengthening and issue HEP;  Janet Berlin PT DPT 9:19 AM,04/25/23

## 2023-04-30 ENCOUNTER — Ambulatory Visit: Payer: Medicare PPO

## 2023-05-03 NOTE — Therapy (Signed)
OUTPATIENT PHYSICAL THERAPY BALANCE TREATMENT  Patient Name: Ariel Alvarez MRN: [id number] DOB:[birth year], 77 y.o., female Today's Date: 05/07/2023  END OF SESSION:  PT End of Session - 05/07/23 0809     Visit Number 5    Number of Visits 17    Date for PT Re-Evaluation 06/05/23    Authorization Type eval: 04/10/23    PT Start Time 0800    PT Stop Time 0830    PT Time Calculation (min) 30 min    Activity Tolerance Patient limited by fatigue    Behavior During Therapy WFL for tasks assessed/performed            Past Medical History:  Diagnosis Date   Anxiety    Arthritis    RIGHT HAND   Collagen vascular disease (HCC)    Complication of anesthesia    HARD TO WAKE UP AFTER  ONE SURGERY-PT STAETS SHE WAS GIVEN TOO MUCH ANESTHESIA   Depression    Dyspnea    VERY RARE WITH EXERTION   GERD (gastroesophageal reflux disease)    History of kidney stones    H/O   Hypothyroidism    Stroke (HCC) [id number]   X2-LEFT HAD PARALYZED    Past Surgical History:  Procedure Laterality Date   CHOLECYSTECTOMY     KIDNEY STONE SURGERY     METATARSAL HEAD EXCISION Bilateral 04/15/2020   Procedure: METATARSAL HEAD PARTIAL EXCISION - BILATERAL;  Surgeon: Linus Galas, DPM;  Location: [facility] ORS;  Service: Podiatry;  Laterality: Bilateral;   Patient Active Problem List   Diagnosis Date Noted   Hypoxia 12/14/2022   COPD with acute bronchitis (HCC) 12/14/2022   History of pulmonary embolism 12/14/2022   Chronic anticoagulation 12/14/2022   Bleeding gums 12/14/2022   Rheumatoid arthritis (HCC) 12/06/2022   Type 2 diabetes mellitus without complication, without long-term current use of insulin (HCC) 05/21/2022   CAP (community acquired pneumonia) 01/31/2022   Diabetes (HCC) 01/31/2022   Acute respiratory failure with hypoxia (HCC) 01/31/2022   Weakness of both lower extremities 01/31/2022   Lactic acidosis 01/31/2022   Moderate episode of recurrent major depressive disorder (HCC)  10/03/2021   Cognitive disorder 10/03/2021   Episodic mood disorder (HCC) 10/03/2021   Psychosis (HCC) 10/03/2021   Gait disturbance 12/19/2020   Rash 07/25/2020   Increased frequency of urination 07/25/2020   Closed nondisplaced fracture of proximal phalanx of left little finger with routine healing 04/07/2020   Hyperlipidemia 12/08/2019   Stroke (HCC) 12/08/2019   Ulcerated, foot, unspecified laterality, limited to breakdown of skin (HCC) 12/08/2019   Other constipation 09/28/2019   Dysphagia 09/28/2019   Pelvic pain in female 05/18/2019   Age-related osteoporosis without current pathological fracture 08/08/2018   Urinary hesitancy 10/04/2017   Routine health maintenance 08/26/2017   Candidiasis 08/11/2017   Morbid obesity with BMI of 40.0-44.9, adult (HCC) 07/30/2017   Collagen vascular disease (HCC) 05/21/2017   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) 05/21/2017   Chronic pain 05/21/2017   Cerebrovascular 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) 05/06/2017   Preoperative clearance 05/06/2017   DVT (deep venous thrombosis) (HCC) 03/24/2017   Contracture of joint of left hand 02/17/2017   Other pulmonary embolism without acute cor pulmonale (HCC) 02/06/2017   Right pontine stroke (HCC) 02/03/2017   Vasculitis (HCC) 01/29/2017   Abnormal ANCA test 12/30/2016   Right carpal tunnel syndrome 04/06/2016   GERD (gastroesophageal reflux disease) 12/31/2014   Dyslipidemia 04/06/2014   Calculus of  kidney and ureter 05/06/2012   Primary insomnia 07/06/2011   Hypothyroidism 07/06/2011   Depressive disorder 07/06/2011   Spastic hemiplegia affecting nondominant side (HCC) 03/27/2011   PCP: Tawnya Crook, MD  REFERRING PROVIDER: Tawnya Crook, MD   REFERRING DIAG: [phone] (ICD-10-CM) - Personal history of transient ischemic attack (TIA), and cerebral infarction without residual deficits, R26.9 (ICD-10-CM) - Unspecified abnormalities of gait and  mobility  RATIONALE FOR EVALUATION AND TREATMENT: Rehabilitation      THERAPY DIAG: Difficulty in walking, not elsewhere classified  Muscle weakness (generalized)  ONSET DATE: 2012  FOLLOW-UP APPT SCHEDULED WITH REFERRING PROVIDER: Yes, next month  FROM INITIAL EVALUATION SUBJECTIVE:                                                                                                                                                                                         SUBJECTIVE STATEMENT:  "I want to be able to walk and stand on my own. I want to be able to do stairs."  PERTINENT HISTORY:  Pt referred for physical therapy for mobility deficits related to multiple chronic strokes with spastic hemiplegia affecting her nondominant side (LUE>LLE). She lives in a single story home (ramp entry) with her spouse. Bathroom has walk-in shower with grabs bars, built-in seat and handheld shower, and ADA height toilet w/grab bars. She currently ambulates limited distances at home with a hemiwalker. She has a history of repeated bouts of physical therapy including multiple episodes with this particular therapist. Most recently she received HH PT from October 2023 through January 2024. Her work with [facility] PT was limited due to complaints of foot pain and intolerance to activity. She did not reach her goals and pt states that she has not been consistent with her HEP since discharge. Per patient "they didn't do anything for me." Her HH PT notes are very detailed and it appears that patient's limitations are related to medical complications, pain, and a lack of compliance. She made very little progress improving from maxA with most mobility to modA. This is consistent with her prior episodes with this therapy where little to no progress was achieved. Pt denies an recent change in her baseline function. Pt stated goals are, "I want to be able to walk and stand on my own. I want to be able to do stairs." These have been  her goals for many years and she has been unable to achieve them in the past. Her medical history is complex and her PMH includes morbid obesity, RA, COPD, DM, peripheral neuropathy, MDD, mood disorder, cognitive disorder, dysphagia, vasculitis, osteoporosis, CVA, and chronic pain among other ongoing medical issues.  Pain: No Numbness/Tingling: Yes, numbness in LUE/LLE, Hx of peripheral neuropathy; Focal Weakness: Yes, L side weakness since CVA  Recent changes in overall health/medication: No Prior history of physical therapy for balance:  Yes, extensive history of outpatient and HH PT. Dominant hand: right Red flags: Negative for bowel/bladder changes, saddle paresthesia, personal history of cancer, h/o spinal tumors, h/o compression fx, h/o abdominal aneurysm, abdominal pain, chills/fever, night sweats, nausea, vomiting, unrelenting pain  PRECAUTIONS: None  WEIGHT BEARING RESTRICTIONS: No  FALLS: Has patient fallen in last 6 months? Yes. Number of falls 1  Living Environment Lives with: lives with an adult companion "significant other x 30 years" Lives in: House/apartment, one level; Stairs:  4 steps to enter, pt uses ramp; Has following equipment at home: Hemi walker, bed side commode, and walkin-shower with grab bars and seat;  Prior level of function: Needs assistance with ADLs and Needs assistance with homemaking  Occupational demands: Not working  Hobbies: Reading  Patient Goals: "I want to be able to walk and stand on my own. I want to be able to do stairs."   OBJECTIVE:   Patient Surveys  FOTO: 32, predicted improvement to 51 ABC: To be completed  Cognition Patient is oriented to person, place, and time.  Recent memory is intact.  Remote memory is intact.  Attention span and concentration are intact.  Expressive speech is intact.  Patient's fund of knowledge is within normal limits for educational level. She appears to have poor insight into her potential to  improve given failure to improve with physical therapy over multiple years and multiple episodes of care.    Gross Musculoskeletal Assessment Tremor: None Bulk: Decreased muscle bulk in LUE Tone: Spastic hemiplegia LUE>LLE;  Posture: Hemplegic LUE resting at patient's side;  AROM Deferred  LE MMT: MMT (out of 5) Right  Left   Hip flexion 5 3+  Hip extension    Hip abduction    Hip adduction    Hip internal rotation    Hip external rotation    Knee flexion 5 4+  Knee extension 5 3+  Ankle dorsiflexion 5 3+  (* = pain; Blank rows = not tested)  Sensation Diminished throughout entire LUE/LLE to light touch. Proprioception, stereognosis, and hot/cold testing deferred on this date.  Reflexes Deferred  Cranial Nerves Deferred  Coordination/Cerebellar Deferred  Bed mobility: Not tested  Transfers: Assistive device utilized: Hemi walker  Sit to stand: Min A Stand to sit: Mod A Chair to chair:  ModA+1 to transfer chair to chair; Floor:  Deferred  Curb:  Curb Comments: Deferred  Stairs: Comments: Unsafe to attempt  Gait: Gait pattern: step to pattern, decreased step length- Right, decreased step length- Left, decreased stance time- Right, decreased stance time- Left, and decreased ankle dorsiflexion- Left Distance walked:  Assistive device utilized: Hemi walker Level of assistance: Modified independence Comments: Pt ambulates very slowly with hemiwalker in RUE and step-to pattern. Spasticity noted in L ankle with absent heel strike and limited dorsiflexion range of motion;  Functional Outcome Measures  Results Comments  BERG 9/56 High risk of falls  TUG Unsafe to attempt   5TSTS Unable   2/6 Minute Walk Test Unable to attempt   10' walk speed Fastest: 47.8s = 0.06 m/s Very slow and not functional for household or community mobility   (Blank rows = not tested)   TODAY'S TREATMENT    SUBJECTIVE: Pt reports being sore since her last therapy session. She  states that the last therapist  stretched her too much resulting persistent soreness. She also has chronic neuropathy that causes her a lot of pain. No specific questions currently   PAIN: BLE neuropathy (chronic)   Ther-ex NuStep L0 x 6 minutes for warm-up during interval history, therapist monitoring throughout, rest break required at 2:30; Transfers with CGA to min assist.  Ambulation 20 ft x 1, 10 ft x 1 with CGA of 1 with HW.  Side-stepping in // bars with single RUE support x 1.5 lengths; Seated marches x 10 BLE; Seated clams with manual resistance x 10 BLE; Seated adductor squeezes with manual resistance x 10 BLE; Seated LAQ with manual resistance x 10 BLE; Seated hamstring curls with manual resistance x 10 BLE;    PATIENT EDUCATION:  Education details: Pt educated throughout session about proper posture and technique with exercises. Improved exercise technique, movement at target joints, use of target muscles after min to mod verbal, visual, tactile cues. Prognosis. Continue current HEP. Person educated: Patient Education method: Explanation, Demonstration, and Verbal cues Education comprehension: verbalized understanding and returned demonstration   HOME EXERCISE PROGRAM:  Continue current program as issued by [facility]   ASSESSMENT:  CLINICAL IMPRESSION: Session shortened at pt request due to a conflicting appointment. She demonstrates good motivation during session but continues to require multiple seated rest breaks due to fatigue. Progressed strengthening and gait with the NuStep and in the parallel bars. Encouraged pt to continue the HEP issued by [facility] and to follow-up as scheduled. Plan to progress strengthening at future sessions. She will benefit from a trial of PT for strength, balance, and mobility to determine whether it will make functional improvement.  OBJECTIVE IMPAIRMENTS: Abnormal gait, decreased activity tolerance, decreased balance, decreased knowledge of  condition, decreased mobility, difficulty walking, decreased strength, decreased safety awareness, impaired tone, impaired UE functional use, obesity, and pain.   ACTIVITY LIMITATIONS: carrying, lifting, bending, standing, squatting, stairs, transfers, bed mobility, bathing, toileting, and dressing  PARTICIPATION LIMITATIONS: meal prep, cleaning, laundry, medication management, driving, shopping, and community activity  PERSONAL FACTORS: Age, Behavior pattern, Past/current experiences, Time since onset of injury/illness/exacerbation, and 3+ comorbidities: DM, OP, CVA, neuropathy, cognitive impairment, MDD, mood disorder, and chronic pain  are also affecting patient's functional outcome.   REHAB POTENTIAL: Poor prior failed episodes of care  CLINICAL DECISION MAKING: Unstable/unpredictable  EVALUATION COMPLEXITY: High   GOALS: Goals reviewed with patient? No  SHORT TERM GOALS: Target date: 05/08/2023  Pt will be independent with HEP in order to improve strength and balance in order to decrease fall risk and improve function at home. Baseline:  Goal status: INITIAL   LONG TERM GOALS: Target date: 06/05/2023  Pt will increase FOTO to at least 43 to demonstrate significant improvement in function at home related to balance  Baseline: 04/10/23: 32 Goal status: INITIAL  2.  Pt will improve BERG by at least 3 points in order to demonstrate clinically significant improvement in balance.   Baseline: 04/10/23: 9/56 Goal status: INITIAL  3.  Pt will improve ABC by at least 13% in order to demonstrate clinically significant improvement in balance confidence.      Baseline: 04/10/23: To be completed Goal status: INITIAL  4. Pt will increase by at least 0.13 m/s in order to demonstrate clinically significant improvement in household ambulation.       Baseline: 04/10/23: 0.06 m/s Goal status: INITIAL   PLAN: PT FREQUENCY: 2x/week  PT DURATION: 8 weeks  PLANNED INTERVENTIONS:  Therapeutic exercises, Therapeutic activity, Neuromuscular re-education,  Balance training, Gait training, Patient/Family education, Self Care, Joint mobilization, Joint manipulation, Vestibular training, Canalith repositioning, Orthotic/Fit training, DME instructions, Dry Needling, Electrical stimulation, Spinal manipulation, Spinal mobilization, Cryotherapy, Moist heat, Taping, Traction, Ultrasound, Ionotophoresis 4mg /ml Dexamethasone, Manual therapy, and Re-evaluation.  PLAN FOR NEXT SESSION: complete ABC, progress strengthening and issue HEP;  Sharalyn Ink Roseana Rhine PT, DPT, GCS  8:43 AM,05/07/23

## 2023-05-07 ENCOUNTER — Ambulatory Visit: Payer: Medicare PPO | Attending: Internal Medicine

## 2023-05-07 DIAGNOSIS — M6281 Muscle weakness (generalized): Secondary | ICD-10-CM

## 2023-05-07 DIAGNOSIS — R262 Difficulty in walking, not elsewhere classified: Secondary | ICD-10-CM | POA: Diagnosis present

## 2023-05-09 ENCOUNTER — Ambulatory Visit: Payer: Medicare PPO

## 2023-05-09 DIAGNOSIS — R262 Difficulty in walking, not elsewhere classified: Secondary | ICD-10-CM

## 2023-05-09 DIAGNOSIS — M6281 Muscle weakness (generalized): Secondary | ICD-10-CM

## 2023-05-09 NOTE — Therapy (Signed)
OUTPATIENT PHYSICAL THERAPY BALANCE TREATMENT  Patient Name: ALBANA SAPERSTEIN MRN: [id number] DOB:1946-06-08, 77 y.o., female Today's Date: 05/09/2023  END OF SESSION:  PT End of Session - 05/09/23 0807     Visit Number 6    Number of Visits 17    Date for PT Re-Evaluation 06/05/23    Authorization Type eval: 04/10/23    PT Start Time 0800    PT Stop Time 0845    PT Time Calculation (min) 45 min    Activity Tolerance Patient limited by fatigue    Behavior During Therapy WFL for tasks assessed/performed            Past Medical History:  Diagnosis Date   Anxiety    Arthritis    RIGHT HAND   Collagen vascular disease (HCC)    Complication of anesthesia    HARD TO WAKE UP AFTER  ONE SURGERY-PT STAETS SHE WAS GIVEN TOO MUCH ANESTHESIA   Depression    Dyspnea    VERY RARE WITH EXERTION   GERD (gastroesophageal reflux disease)    History of kidney stones    H/O   Hypothyroidism    Stroke (HCC) 0865,7846   X2-LEFT HAD PARALYZED    Past Surgical History:  Procedure Laterality Date   CHOLECYSTECTOMY     KIDNEY STONE SURGERY     METATARSAL HEAD EXCISION Bilateral 04/15/2020   Procedure: METATARSAL HEAD PARTIAL EXCISION - BILATERAL;  Surgeon: Linus Galas, DPM;  Location: [facility] ORS;  Service: Podiatry;  Laterality: Bilateral;   Patient Active Problem List   Diagnosis Date Noted   Hypoxia 12/14/2022   COPD with acute bronchitis (HCC) 12/14/2022   History of pulmonary embolism 12/14/2022   Chronic anticoagulation 12/14/2022   Bleeding gums 12/14/2022   Rheumatoid arthritis (HCC) 12/06/2022   Type 2 diabetes mellitus without complication, without long-term current use of insulin (HCC) 05/21/2022   CAP (community acquired pneumonia) 01/31/2022   Diabetes (HCC) 01/31/2022   Acute respiratory failure with hypoxia (HCC) 01/31/2022   Weakness of both lower extremities 01/31/2022   Lactic acidosis 01/31/2022   Moderate episode of recurrent major depressive disorder (HCC)  10/03/2021   Cognitive disorder 10/03/2021   Episodic mood disorder (HCC) 10/03/2021   Psychosis (HCC) 10/03/2021   Gait disturbance 12/19/2020   Rash 07/25/2020   Increased frequency of urination 07/25/2020   Closed nondisplaced fracture of proximal phalanx of left little finger with routine healing 04/07/2020   Hyperlipidemia 12/08/2019   Stroke (HCC) 12/08/2019   Ulcerated, foot, unspecified laterality, limited to breakdown of skin (HCC) 12/08/2019   Other constipation 09/28/2019   Dysphagia 09/28/2019   Pelvic pain in female 05/18/2019   Age-related osteoporosis without current pathological fracture 08/08/2018   Urinary hesitancy 10/04/2017   Routine health maintenance 08/26/2017   Candidiasis 08/11/2017   Morbid obesity with BMI of 40.0-44.9, adult (HCC) 07/30/2017   Collagen vascular disease (HCC) 05/21/2017   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) 05/21/2017   Chronic pain 05/21/2017   Cerebrovascular 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) 05/06/2017   Preoperative clearance 05/06/2017   DVT (deep venous thrombosis) (HCC) 03/24/2017   Contracture of joint of left hand 02/17/2017   Other pulmonary embolism without acute cor pulmonale (HCC) 02/06/2017   Right pontine stroke (HCC) 02/03/2017   Vasculitis (HCC) 01/29/2017   Abnormal ANCA test 12/30/2016   Right carpal tunnel syndrome 04/06/2016   GERD (gastroesophageal reflux disease) 12/31/2014   Dyslipidemia 04/06/2014   Calculus of  kidney and ureter 05/06/2012   Primary insomnia 07/06/2011   Hypothyroidism 07/06/2011   Depressive disorder 07/06/2011   Spastic hemiplegia affecting nondominant side (HCC) 03/27/2011   PCP: Tawnya Crook, MD  REFERRING PROVIDER: Tawnya Crook, MD   REFERRING DIAG: [phone] (ICD-10-CM) - Personal history of transient ischemic attack (TIA), and cerebral infarction without residual deficits, R26.9 (ICD-10-CM) - Unspecified abnormalities of gait and  mobility  RATIONALE FOR EVALUATION AND TREATMENT: Rehabilitation      THERAPY DIAG: Difficulty in walking, not elsewhere classified  Muscle weakness (generalized)  ONSET DATE: 2012  FOLLOW-UP APPT SCHEDULED WITH REFERRING PROVIDER: Yes, next month  FROM INITIAL EVALUATION SUBJECTIVE:                                                                                                                                                                                         SUBJECTIVE STATEMENT:  "I want to be able to walk and stand on my own. I want to be able to do stairs."  PERTINENT HISTORY:  Pt referred for physical therapy for mobility deficits related to multiple chronic strokes with spastic hemiplegia affecting her nondominant side (LUE>LLE). She lives in a single story home (ramp entry) with her spouse. Bathroom has walk-in shower with grabs bars, built-in seat and handheld shower, and ADA height toilet w/grab bars. She currently ambulates limited distances at home with a hemiwalker. She has a history of repeated bouts of physical therapy including multiple episodes with this particular therapist. Most recently she received HH PT from October 2023 through January 2024. Her work with [facility] PT was limited due to complaints of foot pain and intolerance to activity. She did not reach her goals and pt states that she has not been consistent with her HEP since discharge. Per patient "they didn't do anything for me." Her HH PT notes are very detailed and it appears that patient's limitations are related to medical complications, pain, and a lack of compliance. She made very little progress improving from maxA with most mobility to modA. This is consistent with her prior episodes with this therapy where little to no progress was achieved. Pt denies an recent change in her baseline function. Pt stated goals are, "I want to be able to walk and stand on my own. I want to be able to do stairs." These have been  her goals for many years and she has been unable to achieve them in the past. Her medical history is complex and her PMH includes morbid obesity, RA, COPD, DM, peripheral neuropathy, MDD, mood disorder, cognitive disorder, dysphagia, vasculitis, osteoporosis, CVA, and chronic pain among other ongoing medical issues.  Pain: No Numbness/Tingling: Yes, numbness in LUE/LLE, Hx of peripheral neuropathy; Focal Weakness: Yes, L side weakness since CVA  Recent changes in overall health/medication: No Prior history of physical therapy for balance:  Yes, extensive history of outpatient and HH PT. Dominant hand: right Red flags: Negative for bowel/bladder changes, saddle paresthesia, personal history of cancer, h/o spinal tumors, h/o compression fx, h/o abdominal aneurysm, abdominal pain, chills/fever, night sweats, nausea, vomiting, unrelenting pain  PRECAUTIONS: None  WEIGHT BEARING RESTRICTIONS: No  FALLS: Has patient fallen in last 6 months? Yes. Number of falls 1  Living Environment Lives with: lives with an adult companion "significant other x 30 years" Lives in: House/apartment, one level; Stairs:  4 steps to enter, pt uses ramp; Has following equipment at home: Hemi walker, bed side commode, and walkin-shower with grab bars and seat;  Prior level of function: Needs assistance with ADLs and Needs assistance with homemaking  Occupational demands: Not working  Hobbies: Reading  Patient Goals: "I want to be able to walk and stand on my own. I want to be able to do stairs."   OBJECTIVE:   Patient Surveys  FOTO: 32, predicted improvement to 29 ABC: To be completed  Cognition Patient is oriented to person, place, and time.  Recent memory is intact.  Remote memory is intact.  Attention span and concentration are intact.  Expressive speech is intact.  Patient's fund of knowledge is within normal limits for educational level. She appears to have poor insight into her potential to  improve given failure to improve with physical therapy over multiple years and multiple episodes of care.    Gross Musculoskeletal Assessment Tremor: None Bulk: Decreased muscle bulk in LUE Tone: Spastic hemiplegia LUE>LLE;  Posture: Hemplegic LUE resting at patient's side;  AROM Deferred  LE MMT: MMT (out of 5) Right  Left   Hip flexion 5 3+  Hip extension    Hip abduction    Hip adduction    Hip internal rotation    Hip external rotation    Knee flexion 5 4+  Knee extension 5 3+  Ankle dorsiflexion 5 3+  (* = pain; Blank rows = not tested)  Sensation Diminished throughout entire LUE/LLE to light touch. Proprioception, stereognosis, and hot/cold testing deferred on this date.  Reflexes Deferred  Cranial Nerves Deferred  Coordination/Cerebellar Deferred  Bed mobility: Not tested  Transfers: Assistive device utilized: Hemi walker  Sit to stand: Min A Stand to sit: Mod A Chair to chair:  ModA+1 to transfer chair to chair; Floor:  Deferred  Curb:  Curb Comments: Deferred  Stairs: Comments: Unsafe to attempt  Gait: Gait pattern: step to pattern, decreased step length- Right, decreased step length- Left, decreased stance time- Right, decreased stance time- Left, and decreased ankle dorsiflexion- Left Distance walked:  Assistive device utilized: Hemi walker Level of assistance: Modified independence Comments: Pt ambulates very slowly with hemiwalker in RUE and step-to pattern. Spasticity noted in L ankle with absent heel strike and limited dorsiflexion range of motion;  Functional Outcome Measures  Results Comments  BERG 9/56 High risk of falls  TUG Unsafe to attempt   5TSTS Unable   2/6 Minute Walk Test Unable to attempt   10' walk speed Fastest: 47.8s = 0.06 m/s Very slow and not functional for household or community mobility   (Blank rows = not tested)   TODAY'S TREATMENT    SUBJECTIVE: Pt reports she is doing well today but is tired. She also  has chronic neuropathy  that causes her a lot of pain. No specific questions currently   PAIN: BLE neuropathy (chronic)   Ther-ex NuStep L0-2 x 10 minutes for warm-up during interval history, therapist monitoring throughout, rest break required at 5:00; Transfers with CGA to mod assist.  Ambulation 20 ft x 1, 10 ft x 1 with CGA of 1 with HW.  Sit to stand from regular height chair with RUE support 2 x 5; Side-stepping in // bars with single RUE support x 2 lengths; Standing mini squats x 10; Standing marches x 8 BLE; Standing heel raises x 10; HEP handout provided and reviewed;   PATIENT EDUCATION:  Education details: Pt educated throughout session about proper posture and technique with exercises. Improved exercise technique, movement at target joints, use of target muscles after min to mod verbal, visual, tactile cues. Prognosis. Updated HEP; Person educated: Patient Education method: Explanation, Demonstration, and Verbal cues Education comprehension: verbalized understanding and returned demonstration   HOME EXERCISE PROGRAM:  Access Code: [email or web address] URL: [email or web address] Date: 05/09/2023 Prepared by: Ria Comment  Exercises - Mini Squat with Counter Support  - 1 x daily - 7 x weekly - 2 sets - 10 reps - Seated Long Arc Quad  - 1 x daily - 7 x weekly - 3 sets - 10 reps - 3s hold - Heel Raises with Counter Support  - 1 x daily - 7 x weekly - 2 sets - 10 reps - 3s hold   ASSESSMENT:  CLINICAL IMPRESSION: Pt demonstrates good motivation during session but continues to require multiple seated rest breaks due to fatigue which is slightly worse today. Progressed strengthening and gait with the NuStep and in the parallel bars. Added additional standing exercises. Updated HEP and handout with education provided. Plan to progress strengthening at future sessions. She will benefit from a trial of PT for strength, balance, and mobility to determine whether it will  make functional improvement.  OBJECTIVE IMPAIRMENTS: Abnormal gait, decreased activity tolerance, decreased balance, decreased knowledge of condition, decreased mobility, difficulty walking, decreased strength, decreased safety awareness, impaired tone, impaired UE functional use, obesity, and pain.   ACTIVITY LIMITATIONS: carrying, lifting, bending, standing, squatting, stairs, transfers, bed mobility, bathing, toileting, and dressing  PARTICIPATION LIMITATIONS: meal prep, cleaning, laundry, medication management, driving, shopping, and community activity  PERSONAL FACTORS: Age, Behavior pattern, Past/current experiences, Time since onset of injury/illness/exacerbation, and 3+ comorbidities: DM, OP, CVA, neuropathy, cognitive impairment, MDD, mood disorder, and chronic pain  are also affecting patient's functional outcome.   REHAB POTENTIAL: Poor prior failed episodes of care  CLINICAL DECISION MAKING: Unstable/unpredictable  EVALUATION COMPLEXITY: High   GOALS: Goals reviewed with patient? No  SHORT TERM GOALS: Target date: 05/08/2023  Pt will be independent with HEP in order to improve strength and balance in order to decrease fall risk and improve function at home. Baseline:  Goal status: INITIAL   LONG TERM GOALS: Target date: 06/05/2023  Pt will increase FOTO to at least 43 to demonstrate significant improvement in function at home related to balance  Baseline: 04/10/23: 32 Goal status: INITIAL  2.  Pt will improve BERG by at least 3 points in order to demonstrate clinically significant improvement in balance.   Baseline: 04/10/23: 9/56 Goal status: INITIAL  3.  Pt will improve ABC by at least 13% in order to demonstrate clinically significant improvement in balance confidence.      Baseline: 04/10/23: To be completed Goal status: INITIAL  4. Pt will increase by at least 0.13 m/s  in order to demonstrate clinically significant improvement in household ambulation.        Baseline: 04/10/23: 0.06 m/s Goal status: INITIAL   PLAN: PT FREQUENCY: 2x/week  PT DURATION: 8 weeks  PLANNED INTERVENTIONS: Therapeutic exercises, Therapeutic activity, Neuromuscular re-education, Balance training, Gait training, Patient/Family education, Self Care, Joint mobilization, Joint manipulation, Vestibular training, Canalith repositioning, Orthotic/Fit training, DME instructions, Dry Needling, Electrical stimulation, Spinal manipulation, Spinal mobilization, Cryotherapy, Moist heat, Taping, Traction, Ultrasound, Ionotophoresis 4mg /ml Dexamethasone, Manual therapy, and Re-evaluation.  PLAN FOR NEXT SESSION: complete ABC, progress strengthening and issue HEP;  Sharalyn Ink Cooper Moroney PT, DPT, GCS  9:57 AM,05/09/23

## 2023-05-14 ENCOUNTER — Ambulatory Visit: Payer: Medicare PPO

## 2023-05-14 DIAGNOSIS — R262 Difficulty in walking, not elsewhere classified: Secondary | ICD-10-CM

## 2023-05-14 DIAGNOSIS — M6281 Muscle weakness (generalized): Secondary | ICD-10-CM

## 2023-05-14 NOTE — Therapy (Signed)
OUTPATIENT PHYSICAL THERAPY BALANCE TREATMENT  Patient Name: Ariel Alvarez MRN: [id number] DOB:[birth year], 77 y.o., female Today's Date: 05/14/2023  END OF SESSION:  PT End of Session - 05/14/23 0821     Visit Number 7    Number of Visits 17    Date for PT Re-Evaluation 06/05/23    Authorization Type eval: 04/10/23    PT Start Time 0800    PT Stop Time 0845    PT Time Calculation (min) 45 min    Activity Tolerance Patient limited by fatigue    Behavior During Therapy WFL for tasks assessed/performed            Past Medical History:  Diagnosis Date   Anxiety    Arthritis    RIGHT HAND   Collagen vascular disease (HCC)    Complication of anesthesia    HARD TO WAKE UP AFTER  ONE SURGERY-PT STAETS SHE WAS GIVEN TOO MUCH ANESTHESIA   Depression    Dyspnea    VERY RARE WITH EXERTION   GERD (gastroesophageal reflux disease)    History of kidney stones    H/O   Hypothyroidism    Stroke (HCC) 4098,1191   X2-LEFT HAD PARALYZED    Past Surgical History:  Procedure Laterality Date   CHOLECYSTECTOMY     KIDNEY STONE SURGERY     METATARSAL HEAD EXCISION Bilateral 04/15/2020   Procedure: METATARSAL HEAD PARTIAL EXCISION - BILATERAL;  Surgeon: Linus Galas, DPM;  Location: [facility] ORS;  Service: Podiatry;  Laterality: Bilateral;   Patient Active Problem List   Diagnosis Date Noted   Hypoxia 12/14/2022   COPD with acute bronchitis (HCC) 12/14/2022   History of pulmonary embolism 12/14/2022   Chronic anticoagulation 12/14/2022   Bleeding gums 12/14/2022   Rheumatoid arthritis (HCC) 12/06/2022   Type 2 diabetes mellitus without complication, without long-term current use of insulin (HCC) 05/21/2022   CAP (community acquired pneumonia) 01/31/2022   Diabetes (HCC) 01/31/2022   Acute respiratory failure with hypoxia (HCC) 01/31/2022   Weakness of both lower extremities 01/31/2022   Lactic acidosis 01/31/2022   Moderate episode of recurrent major depressive disorder (HCC)  10/03/2021   Cognitive disorder 10/03/2021   Episodic mood disorder (HCC) 10/03/2021   Psychosis (HCC) 10/03/2021   Gait disturbance 12/19/2020   Rash 07/25/2020   Increased frequency of urination 07/25/2020   Closed nondisplaced fracture of proximal phalanx of left little finger with routine healing 04/07/2020   Hyperlipidemia 12/08/2019   Stroke (HCC) 12/08/2019   Ulcerated, foot, unspecified laterality, limited to breakdown of skin (HCC) 12/08/2019   Other constipation 09/28/2019   Dysphagia 09/28/2019   Pelvic pain in female 05/18/2019   Age-related osteoporosis without current pathological fracture 08/08/2018   Urinary hesitancy 10/04/2017   Routine health maintenance 08/26/2017   Candidiasis 08/11/2017   Morbid obesity with BMI of 40.0-44.9, adult (HCC) 07/30/2017   Collagen vascular disease (HCC) 05/21/2017   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) 05/21/2017   Chronic pain 05/21/2017   Cerebrovascular 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) 05/06/2017   Preoperative clearance 05/06/2017   DVT (deep venous thrombosis) (HCC) 03/24/2017   Contracture of joint of left hand 02/17/2017   Other pulmonary embolism without acute cor pulmonale (HCC) 02/06/2017   Right pontine stroke (HCC) 02/03/2017   Vasculitis (HCC) 01/29/2017   Abnormal ANCA test 12/30/2016   Right carpal tunnel syndrome 04/06/2016   GERD (gastroesophageal reflux disease) 12/31/2014   Dyslipidemia 04/06/2014   Calculus of  kidney and ureter 05/06/2012   Primary insomnia 07/06/2011   Hypothyroidism 07/06/2011   Depressive disorder 07/06/2011   Spastic hemiplegia affecting nondominant side (HCC) 03/27/2011   PCP: Tawnya Crook, MD  REFERRING PROVIDER: Tawnya Crook, MD   REFERRING DIAG: [phone] (ICD-10-CM) - Personal history of transient ischemic attack (TIA), and cerebral infarction without residual deficits, R26.9 (ICD-10-CM) - Unspecified abnormalities of gait and  mobility  RATIONALE FOR EVALUATION AND TREATMENT: Rehabilitation      THERAPY DIAG: Difficulty in walking, not elsewhere classified  Muscle weakness (generalized)  ONSET DATE: 2012  FOLLOW-UP APPT SCHEDULED WITH REFERRING PROVIDER: Yes, next month  FROM INITIAL EVALUATION SUBJECTIVE:                                                                                                                                                                                         SUBJECTIVE STATEMENT:  "I want to be able to walk and stand on my own. I want to be able to do stairs."  PERTINENT HISTORY:  Pt referred for physical therapy for mobility deficits related to multiple chronic strokes with spastic hemiplegia affecting her nondominant side (LUE>LLE). She lives in a single story home (ramp entry) with her spouse. Bathroom has walk-in shower with grabs bars, built-in seat and handheld shower, and ADA height toilet w/grab bars. She currently ambulates limited distances at home with a hemiwalker. She has a history of repeated bouts of physical therapy including multiple episodes with this particular therapist. Most recently she received HH PT from October 2023 through January 2024. Her work with [facility] PT was limited due to complaints of foot pain and intolerance to activity. She did not reach her goals and pt states that she has not been consistent with her HEP since discharge. Per patient "they didn't do anything for me." Her HH PT notes are very detailed and it appears that patient's limitations are related to medical complications, pain, and a lack of compliance. She made very little progress improving from maxA with most mobility to modA. This is consistent with her prior episodes with this therapy where little to no progress was achieved. Pt denies an recent change in her baseline function. Pt stated goals are, "I want to be able to walk and stand on my own. I want to be able to do stairs." These have been  her goals for many years and she has been unable to achieve them in the past. Her medical history is complex and her PMH includes morbid obesity, RA, COPD, DM, peripheral neuropathy, MDD, mood disorder, cognitive disorder, dysphagia, vasculitis, osteoporosis, CVA, and chronic pain among other ongoing medical issues.  Pain: No Numbness/Tingling: Yes, numbness in LUE/LLE, Hx of peripheral neuropathy; Focal Weakness: Yes, L side weakness since CVA  Recent changes in overall health/medication: No Prior history of physical therapy for balance:  Yes, extensive history of outpatient and HH PT. Dominant hand: right Red flags: Negative for bowel/bladder changes, saddle paresthesia, personal history of cancer, h/o spinal tumors, h/o compression fx, h/o abdominal aneurysm, abdominal pain, chills/fever, night sweats, nausea, vomiting, unrelenting pain  PRECAUTIONS: None  WEIGHT BEARING RESTRICTIONS: No  FALLS: Has patient fallen in last 6 months? Yes. Number of falls 1  Living Environment Lives with: lives with an adult companion "significant other x 30 years" Lives in: House/apartment, one level; Stairs:  4 steps to enter, pt uses ramp; Has following equipment at home: Hemi walker, bed side commode, and walkin-shower with grab bars and seat;  Prior level of function: Needs assistance with ADLs and Needs assistance with homemaking  Occupational demands: Not working  Hobbies: Reading  Patient Goals: "I want to be able to walk and stand on my own. I want to be able to do stairs."   OBJECTIVE:   Patient Surveys  FOTO: 32, predicted improvement to 34 ABC: To be completed  Cognition Patient is oriented to person, place, and time.  Recent memory is intact.  Remote memory is intact.  Attention span and concentration are intact.  Expressive speech is intact.  Patient's fund of knowledge is within normal limits for educational level. She appears to have poor insight into her potential to  improve given failure to improve with physical therapy over multiple years and multiple episodes of care.    Gross Musculoskeletal Assessment Tremor: None Bulk: Decreased muscle bulk in LUE Tone: Spastic hemiplegia LUE>LLE;  Posture: Hemplegic LUE resting at patient's side;  AROM Deferred  LE MMT: MMT (out of 5) Right  Left   Hip flexion 5 3+  Hip extension    Hip abduction    Hip adduction    Hip internal rotation    Hip external rotation    Knee flexion 5 4+  Knee extension 5 3+  Ankle dorsiflexion 5 3+  (* = pain; Blank rows = not tested)  Sensation Diminished throughout entire LUE/LLE to light touch. Proprioception, stereognosis, and hot/cold testing deferred on this date.  Reflexes Deferred  Cranial Nerves Deferred  Coordination/Cerebellar Deferred  Bed mobility: Not tested  Transfers: Assistive device utilized: Hemi walker  Sit to stand: Min A Stand to sit: Mod A Chair to chair:  ModA+1 to transfer chair to chair; Floor:  Deferred  Curb:  Curb Comments: Deferred  Stairs: Comments: Unsafe to attempt  Gait: Gait pattern: step to pattern, decreased step length- Right, decreased step length- Left, decreased stance time- Right, decreased stance time- Left, and decreased ankle dorsiflexion- Left Distance walked:  Assistive device utilized: Hemi walker Level of assistance: Modified independence Comments: Pt ambulates very slowly with hemiwalker in RUE and step-to pattern. Spasticity noted in L ankle with absent heel strike and limited dorsiflexion range of motion;  Functional Outcome Measures  Results Comments  BERG 9/56 High risk of falls  TUG Unsafe to attempt   5TSTS Unable   2/6 Minute Walk Test Unable to attempt   10' walk speed Fastest: 47.8s = 0.06 m/s Very slow and not functional for household or community mobility   (Blank rows = not tested)   TODAY'S TREATMENT    SUBJECTIVE: Pt reports she is doing well today but her legs feel  "stiff." No changes since  the last therapy session. She has chronic neuropathy that causes her a lot of pain. No specific questions currently.   PAIN: BLE neuropathy (chronic)   Ther-ex NuStep L0-1 x 10 minutes for warm-up during interval history, therapist monitoring throughout, rest break required at 2:30 and 7:30 Transfers with CGA to mod assist.  Ambulation 20 ft x 1, 10 ft x 1 with CGA of 1 with HW.  Standing mini squats x 15; Sit to stand from regular height chair with RUE support x 5; Side-stepping in // bars with single RUE support x 2 lengths; Standing marches x 10 BLE; Standing hamstring curls x 10 BLE; Standing heel raises x 10; HEP handout provided and reviewed;   PATIENT EDUCATION:  Education details: Pt educated throughout session about proper posture and technique with exercises. Improved exercise technique, movement at target joints, use of target muscles after min to mod verbal, visual, tactile cues. Updated HEP; Person educated: Patient Education method: Explanation, Demonstration, and Verbal cues Education comprehension: verbalized understanding and returned demonstration   HOME EXERCISE PROGRAM:  Access Code: [email or web address] URL: [email or web address] Date: 05/14/2023 Prepared by: Ria Comment  Exercises - Mini Squat with Counter Support  - 1 x daily - 7 x weekly - 2 sets - 10 reps - Seated Long Arc Quad  - 1 x daily - 7 x weekly - 3 sets - 10 reps - 3s hold - Seated Hip Adduction Isometrics with Ball  - 1 x daily - 7 x weekly - 2 sets - 10 reps - 3s hold - Heel Raises with Counter Support  - 1 x daily - 7 x weekly - 2 sets - 10 reps - 3s hold   ASSESSMENT:  CLINICAL IMPRESSION: Pt demonstrates good motivation during session but continues to require multiple seated rest breaks due to fatigue. Progressed strengthening and gait with the NuStep and in the parallel bars. Performed additional standing exercises again today. Updated HEP and handout with  education provided. Plan to progress strengthening at future sessions. She will benefit from a trial of PT for strength, balance, and mobility to determine whether it will make functional improvement.  OBJECTIVE IMPAIRMENTS: Abnormal gait, decreased activity tolerance, decreased balance, decreased knowledge of condition, decreased mobility, difficulty walking, decreased strength, decreased safety awareness, impaired tone, impaired UE functional use, obesity, and pain.   ACTIVITY LIMITATIONS: carrying, lifting, bending, standing, squatting, stairs, transfers, bed mobility, bathing, toileting, and dressing  PARTICIPATION LIMITATIONS: meal prep, cleaning, laundry, medication management, driving, shopping, and community activity  PERSONAL FACTORS: Age, Behavior pattern, Past/current experiences, Time since onset of injury/illness/exacerbation, and 3+ comorbidities: DM, OP, CVA, neuropathy, cognitive impairment, MDD, mood disorder, and chronic pain  are also affecting patient's functional outcome.   REHAB POTENTIAL: Poor prior failed episodes of care  CLINICAL DECISION MAKING: Unstable/unpredictable  EVALUATION COMPLEXITY: High   GOALS: Goals reviewed with patient? No  SHORT TERM GOALS: Target date: 05/08/2023  Pt will be independent with HEP in order to improve strength and balance in order to decrease fall risk and improve function at home. Baseline:  Goal status: INITIAL   LONG TERM GOALS: Target date: 06/05/2023  Pt will increase FOTO to at least 43 to demonstrate significant improvement in function at home related to balance  Baseline: 04/10/23: 32 Goal status: INITIAL  2.  Pt will improve BERG by at least 3 points in order to demonstrate clinically significant improvement in balance.   Baseline: 04/10/23: 9/56 Goal status: INITIAL  3.  Pt will improve ABC by at  least 13% in order to demonstrate clinically significant improvement in balance confidence.      Baseline: 04/10/23: To be  completed Goal status: INITIAL  4. Pt will increase by at least 0.13 m/s in order to demonstrate clinically significant improvement in household ambulation.       Baseline: 04/10/23: 0.06 m/s Goal status: INITIAL   PLAN: PT FREQUENCY: 2x/week  PT DURATION: 8 weeks  PLANNED INTERVENTIONS: Therapeutic exercises, Therapeutic activity, Neuromuscular re-education, Balance training, Gait training, Patient/Family education, Self Care, Joint mobilization, Joint manipulation, Vestibular training, Canalith repositioning, Orthotic/Fit training, DME instructions, Dry Needling, Electrical stimulation, Spinal manipulation, Spinal mobilization, Cryotherapy, Moist heat, Taping, Traction, Ultrasound, Ionotophoresis 4mg /ml Dexamethasone, Manual therapy, and Re-evaluation.  PLAN FOR NEXT SESSION: complete ABC, progress strengthening and issue HEP;  Sharalyn Ink Asaf Elmquist PT, DPT, GCS  10:16 AM,05/14/23

## 2023-05-16 ENCOUNTER — Ambulatory Visit: Payer: Medicare PPO

## 2023-05-16 DIAGNOSIS — R262 Difficulty in walking, not elsewhere classified: Secondary | ICD-10-CM | POA: Diagnosis not present

## 2023-05-16 DIAGNOSIS — M6281 Muscle weakness (generalized): Secondary | ICD-10-CM

## 2023-05-16 NOTE — Therapy (Signed)
OUTPATIENT PHYSICAL THERAPY BALANCE TREATMENT  Patient Name: RAIANA PHARRIS MRN: [id number] DOB:[birth year], 77 y.o., female Today's Date: 05/16/2023  END OF SESSION:  PT End of Session - 05/16/23 0807     Visit Number 8    Number of Visits 17    Date for PT Re-Evaluation 06/05/23    Authorization Type eval: 04/10/23    PT Start Time 0800    PT Stop Time 0845    PT Time Calculation (min) 45 min    Activity Tolerance Patient limited by fatigue    Behavior During Therapy WFL for tasks assessed/performed            Past Medical History:  Diagnosis Date   Anxiety    Arthritis    RIGHT HAND   Collagen vascular disease (HCC)    Complication of anesthesia    HARD TO WAKE UP AFTER  ONE SURGERY-PT STAETS SHE WAS GIVEN TOO MUCH ANESTHESIA   Depression    Dyspnea    VERY RARE WITH EXERTION   GERD (gastroesophageal reflux disease)    History of kidney stones    H/O   Hypothyroidism    Stroke (HCC) [id number]   X2-LEFT HAD PARALYZED    Past Surgical History:  Procedure Laterality Date   CHOLECYSTECTOMY     KIDNEY STONE SURGERY     METATARSAL HEAD EXCISION Bilateral 04/15/2020   Procedure: METATARSAL HEAD PARTIAL EXCISION - BILATERAL;  Surgeon: Linus Galas, DPM;  Location: [facility] ORS;  Service: Podiatry;  Laterality: Bilateral;   Patient Active Problem List   Diagnosis Date Noted   Hypoxia 12/14/2022   COPD with acute bronchitis (HCC) 12/14/2022   History of pulmonary embolism 12/14/2022   Chronic anticoagulation 12/14/2022   Bleeding gums 12/14/2022   Rheumatoid arthritis (HCC) 12/06/2022   Type 2 diabetes mellitus without complication, without long-term current use of insulin (HCC) 05/21/2022   CAP (community acquired pneumonia) 01/31/2022   Diabetes (HCC) 01/31/2022   Acute respiratory failure with hypoxia (HCC) 01/31/2022   Weakness of both lower extremities 01/31/2022   Lactic acidosis 01/31/2022   Moderate episode of recurrent major depressive disorder (HCC)  10/03/2021   Cognitive disorder 10/03/2021   Episodic mood disorder (HCC) 10/03/2021   Psychosis (HCC) 10/03/2021   Gait disturbance 12/19/2020   Rash 07/25/2020   Increased frequency of urination 07/25/2020   Closed nondisplaced fracture of proximal phalanx of left little finger with routine healing 04/07/2020   Hyperlipidemia 12/08/2019   Stroke (HCC) 12/08/2019   Ulcerated, foot, unspecified laterality, limited to breakdown of skin (HCC) 12/08/2019   Other constipation 09/28/2019   Dysphagia 09/28/2019   Pelvic pain in female 05/18/2019   Age-related osteoporosis without current pathological fracture 08/08/2018   Urinary hesitancy 10/04/2017   Routine health maintenance 08/26/2017   Candidiasis 08/11/2017   Morbid obesity with BMI of 40.0-44.9, adult (HCC) 07/30/2017   Collagen vascular disease (HCC) 05/21/2017   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) 05/21/2017   Chronic pain 05/21/2017   Cerebrovascular 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) 05/06/2017   Preoperative clearance 05/06/2017   DVT (deep venous thrombosis) (HCC) 03/24/2017   Contracture of joint of left hand 02/17/2017   Other pulmonary embolism without acute cor pulmonale (HCC) 02/06/2017   Right pontine stroke (HCC) 02/03/2017   Vasculitis (HCC) 01/29/2017   Abnormal ANCA test 12/30/2016   Right carpal tunnel syndrome 04/06/2016   GERD (gastroesophageal reflux disease) 12/31/2014   Dyslipidemia 04/06/2014   Calculus of  kidney and ureter 05/06/2012   Primary insomnia 07/06/2011   Hypothyroidism 07/06/2011   Depressive disorder 07/06/2011   Spastic hemiplegia affecting nondominant side (HCC) 03/27/2011   PCP: Tawnya Crook, MD  REFERRING PROVIDER: Tawnya Crook, MD   REFERRING DIAG: [phone] (ICD-10-CM) - Personal history of transient ischemic attack (TIA), and cerebral infarction without residual deficits, R26.9 (ICD-10-CM) - Unspecified abnormalities of gait and  mobility  RATIONALE FOR EVALUATION AND TREATMENT: Rehabilitation      THERAPY DIAG: Difficulty in walking, not elsewhere classified  Muscle weakness (generalized)  ONSET DATE: 2012  FOLLOW-UP APPT SCHEDULED WITH REFERRING PROVIDER: Yes, next month  FROM INITIAL EVALUATION SUBJECTIVE:                                                                                                                                                                                         SUBJECTIVE STATEMENT:  "I want to be able to walk and stand on my own. I want to be able to do stairs."  PERTINENT HISTORY:  Pt referred for physical therapy for mobility deficits related to multiple chronic strokes with spastic hemiplegia affecting her nondominant side (LUE>LLE). She lives in a single story home (ramp entry) with her spouse. Bathroom has walk-in shower with grabs bars, built-in seat and handheld shower, and ADA height toilet w/grab bars. She currently ambulates limited distances at home with a hemiwalker. She has a history of repeated bouts of physical therapy including multiple episodes with this particular therapist. Most recently she received HH PT from October 2023 through January 2024. Her work with [facility] was limited due to complaints of foot pain and intolerance to activity. She did not reach her goals and pt states that she has not been consistent with her HEP since discharge. Per patient "they didn't do anything for me." Her HH PT notes are very detailed and it appears that patient's limitations are related to medical complications, pain, and a lack of compliance. She made very little progress improving from maxA with most mobility to modA. This is consistent with her prior episodes with this therapy where little to no progress was achieved. Pt denies an recent change in her baseline function. Pt stated goals are, "I want to be able to walk and stand on my own. I want to be able to do stairs." These have been  her goals for many years and she has been unable to achieve them in the past. Her medical history is complex and her PMH includes morbid obesity, RA, COPD, DM, peripheral neuropathy, MDD, mood disorder, cognitive disorder, dysphagia, vasculitis, osteoporosis, CVA, and chronic pain among other ongoing medical issues.  Pain: No Numbness/Tingling: Yes, numbness in LUE/LLE, Hx of peripheral neuropathy; Focal Weakness: Yes, L side weakness since CVA  Recent changes in overall health/medication: No Prior history of physical therapy for balance:  Yes, extensive history of outpatient and HH PT. Dominant hand: right Red flags: Negative for bowel/bladder changes, saddle paresthesia, personal history of cancer, h/o spinal tumors, h/o compression fx, h/o abdominal aneurysm, abdominal pain, chills/fever, night sweats, nausea, vomiting, unrelenting pain  PRECAUTIONS: None  WEIGHT BEARING RESTRICTIONS: No  FALLS: Has patient fallen in last 6 months? Yes. Number of falls 1  Living Environment Lives with: lives with an adult companion "significant other x 30 years" Lives in: House/apartment, one level; Stairs:  4 steps to enter, pt uses ramp; Has following equipment at home: Hemi walker, bed side commode, and walkin-shower with grab bars and seat;  Prior level of function: Needs assistance with ADLs and Needs assistance with homemaking  Occupational demands: Not working  Hobbies: Reading  Patient Goals: "I want to be able to walk and stand on my own. I want to be able to do stairs."   OBJECTIVE:   Patient Surveys  FOTO: 32, predicted improvement to 50 ABC: To be completed  Cognition Patient is oriented to person, place, and time.  Recent memory is intact.  Remote memory is intact.  Attention span and concentration are intact.  Expressive speech is intact.  Patient's fund of knowledge is within normal limits for educational level. She appears to have poor insight into her potential to  improve given failure to improve with physical therapy over multiple years and multiple episodes of care.    Gross Musculoskeletal Assessment Tremor: None Bulk: Decreased muscle bulk in LUE Tone: Spastic hemiplegia LUE>LLE;  Posture: Hemplegic LUE resting at patient's side;  AROM Deferred  LE MMT: MMT (out of 5) Right  Left   Hip flexion 5 3+  Hip extension    Hip abduction    Hip adduction    Hip internal rotation    Hip external rotation    Knee flexion 5 4+  Knee extension 5 3+  Ankle dorsiflexion 5 3+  (* = pain; Blank rows = not tested)  Sensation Diminished throughout entire LUE/LLE to light touch. Proprioception, stereognosis, and hot/cold testing deferred on this date.  Reflexes Deferred  Cranial Nerves Deferred  Coordination/Cerebellar Deferred  Bed mobility: Not tested  Transfers: Assistive device utilized: Hemi walker  Sit to stand: Min A Stand to sit: Mod A Chair to chair:  ModA+1 to transfer chair to chair; Floor:  Deferred  Curb:  Curb Comments: Deferred  Stairs: Comments: Unsafe to attempt  Gait: Gait pattern: step to pattern, decreased step length- Right, decreased step length- Left, decreased stance time- Right, decreased stance time- Left, and decreased ankle dorsiflexion- Left Distance walked:  Assistive device utilized: Hemi walker Level of assistance: Modified independence Comments: Pt ambulates very slowly with hemiwalker in RUE and step-to pattern. Spasticity noted in L ankle with absent heel strike and limited dorsiflexion range of motion;  Functional Outcome Measures  Results Comments  BERG 9/56 High risk of falls  TUG Unsafe to attempt   5TSTS Unable   2/6 Minute Walk Test Unable to attempt   10' walk speed Fastest: 47.8s = 0.06 m/s Very slow and not functional for household or community mobility   (Blank rows = not tested)   TODAY'S TREATMENT    SUBJECTIVE: Pt reports she is fatigued this morning. Otherwise, no  changes since the last therapy session.  No specific questions currently.   PAIN: BLE neuropathy (chronic)   Ther-ex NuStep L0-1 x 10 minutes for warm-up during interval history, therapist monitoring throughout, rest break required at 2:30 and 7:30 Transfers with CGA to mod assist.  Ambulation 20 ft x 2, CGA x 1 with HW.  Standing mini squats x 16; Sit to stand from regular height chair with RUE support x 5; Side-stepping in // bars with single RUE support 2 x 2 lengths; Standing marches x 10 BLE; Standing heel raises x 10 BLE;   PATIENT EDUCATION:  Education details: Pt educated throughout session about proper posture and technique with exercises. Improved exercise technique, movement at target joints, use of target muscles after min to mod verbal, visual, tactile cues. Updated HEP; Person educated: Patient Education method: Explanation, Demonstration, and Verbal cues Education comprehension: verbalized understanding and returned demonstration   HOME EXERCISE PROGRAM:  Access Code: [email or web address] URL: [email or web address] Date: 05/14/2023 Prepared by: Ria Comment  Exercises - Mini Squat with Counter Support  - 1 x daily - 7 x weekly - 2 sets - 10 reps - Seated Long Arc Quad  - 1 x daily - 7 x weekly - 3 sets - 10 reps - 3s hold - Seated Hip Adduction Isometrics with Ball  - 1 x daily - 7 x weekly - 2 sets - 10 reps - 3s hold - Heel Raises with Counter Support  - 1 x daily - 7 x weekly - 2 sets - 10 reps - 3s hold   ASSESSMENT:  CLINICAL IMPRESSION: Pt demonstrates good motivation during session but requires more seated rest breaks due to excessive fatigue today. Progressed strengthening and gait with the NuStep and in the parallel bars. Performed additional ambulation today. No HEP updates currently. Plan to progress strengthening at future sessions. She will benefit from a trial of PT for strength, balance, and mobility to determine whether it will make functional  improvement.  OBJECTIVE IMPAIRMENTS: Abnormal gait, decreased activity tolerance, decreased balance, decreased knowledge of condition, decreased mobility, difficulty walking, decreased strength, decreased safety awareness, impaired tone, impaired UE functional use, obesity, and pain.   ACTIVITY LIMITATIONS: carrying, lifting, bending, standing, squatting, stairs, transfers, bed mobility, bathing, toileting, and dressing  PARTICIPATION LIMITATIONS: meal prep, cleaning, laundry, medication management, driving, shopping, and community activity  PERSONAL FACTORS: Age, Behavior pattern, Past/current experiences, Time since onset of injury/illness/exacerbation, and 3+ comorbidities: DM, OP, CVA, neuropathy, cognitive impairment, MDD, mood disorder, and chronic pain  are also affecting patient's functional outcome.   REHAB POTENTIAL: Poor prior failed episodes of care  CLINICAL DECISION MAKING: Unstable/unpredictable  EVALUATION COMPLEXITY: High   GOALS: Goals reviewed with patient? No  SHORT TERM GOALS: Target date: 05/08/2023  Pt will be independent with HEP in order to improve strength and balance in order to decrease fall risk and improve function at home. Baseline:  Goal status: INITIAL   LONG TERM GOALS: Target date: 06/05/2023  Pt will increase FOTO to at least 43 to demonstrate significant improvement in function at home related to balance  Baseline: 04/10/23: 32 Goal status: INITIAL  2.  Pt will improve BERG by at least 3 points in order to demonstrate clinically significant improvement in balance.   Baseline: 04/10/23: 9/56 Goal status: INITIAL  3.  Pt will improve ABC by at least 13% in order to demonstrate clinically significant improvement in balance confidence.      Baseline: 04/10/23: To be completed Goal status: INITIAL  4. Pt will increase by at least  0.13 m/s in order to demonstrate clinically significant improvement in household ambulation.       Baseline:  04/10/23: 0.06 m/s Goal status: INITIAL   PLAN: PT FREQUENCY: 2x/week  PT DURATION: 8 weeks  PLANNED INTERVENTIONS: Therapeutic exercises, Therapeutic activity, Neuromuscular re-education, Balance training, Gait training, Patient/Family education, Self Care, Joint mobilization, Joint manipulation, Vestibular training, Canalith repositioning, Orthotic/Fit training, DME instructions, Dry Needling, Electrical stimulation, Spinal manipulation, Spinal mobilization, Cryotherapy, Moist heat, Taping, Traction, Ultrasound, Ionotophoresis 4mg /ml Dexamethasone, Manual therapy, and Re-evaluation.  PLAN FOR NEXT SESSION: progress strengthening and issue HEP;  Sharalyn Ink Evon Dejarnett PT, DPT, GCS  10:01 AM,05/16/23

## 2023-05-21 ENCOUNTER — Ambulatory Visit: Payer: Medicare PPO

## 2023-05-21 DIAGNOSIS — M6281 Muscle weakness (generalized): Secondary | ICD-10-CM

## 2023-05-21 DIAGNOSIS — R262 Difficulty in walking, not elsewhere classified: Secondary | ICD-10-CM

## 2023-05-21 NOTE — Therapy (Signed)
OUTPATIENT PHYSICAL THERAPY BALANCE TREATMENT  Patient Name: Ariel Alvarez MRN: [id number] DOB:1946-02-06, 77 y.o., female Today's Date: 05/23/2023  END OF SESSION:  PT End of Session - 05/23/23 0818     Visit Number 9    Number of Visits 17    Date for PT Re-Evaluation 06/05/23    Authorization Type eval: 04/10/23    PT Start Time 0802    PT Stop Time 0845    PT Time Calculation (min) 43 min    Activity Tolerance Patient limited by fatigue    Behavior During Therapy WFL for tasks assessed/performed             Past Medical History:  Diagnosis Date   Anxiety    Arthritis    RIGHT HAND   Collagen vascular disease (HCC)    Complication of anesthesia    HARD TO WAKE UP AFTER  ONE SURGERY-PT STAETS SHE WAS GIVEN TOO MUCH ANESTHESIA   Depression    Dyspnea    VERY RARE WITH EXERTION   GERD (gastroesophageal reflux disease)    History of kidney stones    H/O   Hypothyroidism    Stroke (HCC) 4098,1191   X2-LEFT HAD PARALYZED    Past Surgical History:  Procedure Laterality Date   CHOLECYSTECTOMY     KIDNEY STONE SURGERY     METATARSAL HEAD EXCISION Bilateral 04/15/2020   Procedure: METATARSAL HEAD PARTIAL EXCISION - BILATERAL;  Surgeon: Linus Galas, DPM;  Location: [facility] ORS;  Service: Podiatry;  Laterality: Bilateral;   Patient Active Problem List   Diagnosis Date Noted   Hypoxia 12/14/2022   COPD with acute bronchitis (HCC) 12/14/2022   History of pulmonary embolism 12/14/2022   Chronic anticoagulation 12/14/2022   Bleeding gums 12/14/2022   Rheumatoid arthritis (HCC) 12/06/2022   Type 2 diabetes mellitus without complication, without long-term current use of insulin (HCC) 05/21/2022   CAP (community acquired pneumonia) 01/31/2022   Diabetes (HCC) 01/31/2022   Acute respiratory failure with hypoxia (HCC) 01/31/2022   Weakness of both lower extremities 01/31/2022   Lactic acidosis 01/31/2022   Moderate episode of recurrent major depressive disorder (HCC)  10/03/2021   Cognitive disorder 10/03/2021   Episodic mood disorder (HCC) 10/03/2021   Psychosis (HCC) 10/03/2021   Gait disturbance 12/19/2020   Rash 07/25/2020   Increased frequency of urination 07/25/2020   Closed nondisplaced fracture of proximal phalanx of left little finger with routine healing 04/07/2020   Hyperlipidemia 12/08/2019   Stroke (HCC) 12/08/2019   Ulcerated, foot, unspecified laterality, limited to breakdown of skin (HCC) 12/08/2019   Other constipation 09/28/2019   Dysphagia 09/28/2019   Pelvic pain in female 05/18/2019   Age-related osteoporosis without current pathological fracture 08/08/2018   Urinary hesitancy 10/04/2017   Routine health maintenance 08/26/2017   Candidiasis 08/11/2017   Morbid obesity with BMI of 40.0-44.9, adult (HCC) 07/30/2017   Collagen vascular disease (HCC) 05/21/2017   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) 05/21/2017   Chronic pain 05/21/2017   Cerebrovascular 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) 05/06/2017   Preoperative clearance 05/06/2017   DVT (deep venous thrombosis) (HCC) 03/24/2017   Contracture of joint of left hand 02/17/2017   Other pulmonary embolism without acute cor pulmonale (HCC) 02/06/2017   Right pontine stroke (HCC) 02/03/2017   Vasculitis (HCC) 01/29/2017   Abnormal ANCA test 12/30/2016   Right carpal tunnel syndrome 04/06/2016   GERD (gastroesophageal reflux disease) 12/31/2014   Dyslipidemia 04/06/2014   Calculus  of kidney and ureter 05/06/2012   Primary insomnia 07/06/2011   Hypothyroidism 07/06/2011   Depressive disorder 07/06/2011   Spastic hemiplegia affecting nondominant side (HCC) 03/27/2011   PCP: Tawnya Crook, MD  REFERRING PROVIDER: Tawnya Crook, MD   REFERRING DIAG: [phone] (ICD-10-CM) - Personal history of transient ischemic attack (TIA), and cerebral infarction without residual deficits, R26.9 (ICD-10-CM) - Unspecified abnormalities of gait and  mobility  RATIONALE FOR EVALUATION AND TREATMENT: Rehabilitation      THERAPY DIAG: Difficulty in walking, not elsewhere classified  Muscle weakness (generalized)  ONSET DATE: 2012  FOLLOW-UP APPT SCHEDULED WITH REFERRING PROVIDER: Yes, next month  FROM INITIAL EVALUATION SUBJECTIVE:                                                                                                                                                                                         SUBJECTIVE STATEMENT:  "I want to be able to walk and stand on my own. I want to be able to do stairs."  PERTINENT HISTORY:  Pt referred for physical therapy for mobility deficits related to multiple chronic strokes with spastic hemiplegia affecting her nondominant side (LUE>LLE). She lives in a single story home (ramp entry) with her spouse. Bathroom has walk-in shower with grabs bars, built-in seat and handheld shower, and ADA height toilet w/grab bars. She currently ambulates limited distances at home with a hemiwalker. She has a history of repeated bouts of physical therapy including multiple episodes with this particular therapist. Most recently she received HH PT from October 2023 through January 2024. Her work with [facility] PT was limited due to complaints of foot pain and intolerance to activity. She did not reach her goals and pt states that she has not been consistent with her HEP since discharge. Per patient "they didn't do anything for me." Her HH PT notes are very detailed and it appears that patient's limitations are related to medical complications, pain, and a lack of compliance. She made very little progress improving from maxA with most mobility to modA. This is consistent with her prior episodes with this therapy where little to no progress was achieved. Pt denies an recent change in her baseline function. Pt stated goals are, "I want to be able to walk and stand on my own. I want to be able to do stairs." These have been  her goals for many years and she has been unable to achieve them in the past. Her medical history is complex and her PMH includes morbid obesity, RA, COPD, DM, peripheral neuropathy, MDD, mood disorder, cognitive disorder, dysphagia, vasculitis, osteoporosis, CVA, and chronic pain among other ongoing medical issues.  Pain: No Numbness/Tingling: Yes, numbness in LUE/LLE, Hx of peripheral neuropathy; Focal Weakness: Yes, L side weakness since CVA  Recent changes in overall health/medication: No Prior history of physical therapy for balance:  Yes, extensive history of outpatient and HH PT. Dominant hand: right Red flags: Negative for bowel/bladder changes, saddle paresthesia, personal history of cancer, h/o spinal tumors, h/o compression fx, h/o abdominal aneurysm, abdominal pain, chills/fever, night sweats, nausea, vomiting, unrelenting pain  PRECAUTIONS: None  WEIGHT BEARING RESTRICTIONS: No  FALLS: Has patient fallen in last 6 months? Yes. Number of falls 1  Living Environment Lives with: lives with an adult companion "significant other x 30 years" Lives in: House/apartment, one level; Stairs:  4 steps to enter, pt uses ramp; Has following equipment at home: Hemi walker, bed side commode, and walkin-shower with grab bars and seat;  Prior level of function: Needs assistance with ADLs and Needs assistance with homemaking  Occupational demands: Not working  Hobbies: Reading  Patient Goals: "I want to be able to walk and stand on my own. I want to be able to do stairs."   OBJECTIVE:   Patient Surveys  FOTO: 32, predicted improvement to 107 ABC: To be completed  Cognition Patient is oriented to person, place, and time.  Recent memory is intact.  Remote memory is intact.  Attention span and concentration are intact.  Expressive speech is intact.  Patient's fund of knowledge is within normal limits for educational level. She appears to have poor insight into her potential to  improve given failure to improve with physical therapy over multiple years and multiple episodes of care.    Gross Musculoskeletal Assessment Tremor: None Bulk: Decreased muscle bulk in LUE Tone: Spastic hemiplegia LUE>LLE;  Posture: Hemplegic LUE resting at patient's side;  AROM Deferred  LE MMT: MMT (out of 5) Right  Left   Hip flexion 5 3+  Hip extension    Hip abduction    Hip adduction    Hip internal rotation    Hip external rotation    Knee flexion 5 4+  Knee extension 5 3+  Ankle dorsiflexion 5 3+  (* = pain; Blank rows = not tested)  Sensation Diminished throughout entire LUE/LLE to light touch. Proprioception, stereognosis, and hot/cold testing deferred on this date.  Reflexes Deferred  Cranial Nerves Deferred  Coordination/Cerebellar Deferred  Bed mobility: Not tested  Transfers: Assistive device utilized: Hemi walker  Sit to stand: Min A Stand to sit: Mod A Chair to chair:  ModA+1 to transfer chair to chair; Floor:  Deferred  Curb:  Curb Comments: Deferred  Stairs: Comments: Unsafe to attempt  Gait: Gait pattern: step to pattern, decreased step length- Right, decreased step length- Left, decreased stance time- Right, decreased stance time- Left, and decreased ankle dorsiflexion- Left Distance walked:  Assistive device utilized: Hemi walker Level of assistance: Modified independence Comments: Pt ambulates very slowly with hemiwalker in RUE and step-to pattern. Spasticity noted in L ankle with absent heel strike and limited dorsiflexion range of motion;  Functional Outcome Measures  Results Comments  BERG 9/56 High risk of falls  TUG Unsafe to attempt   5TSTS Unable   2/6 Minute Walk Test Unable to attempt   10' walk speed Fastest: 47.8s = 0.06 m/s Very slow and not functional for household or community mobility   (Blank rows = not tested)   TODAY'S TREATMENT    SUBJECTIVE: Pt reports she is fatigued this morning. Otherwise, no  changes since the last therapy session.  No specific questions currently.   PAIN: BLE neuropathy (chronic)   Ther-ex NuStep L0-1 x 10 minutes for warm-up during interval history, therapist monitoring throughout, rest break required at 2:30 and 7:30 Ambulation 10 ft x 2, CGA x 1 with HW.  Standing mini squats x 10 Standing marches x 10 BLE;   Neuromuscular Re-education  Transfers with CGA to mod assist.  TUG: 86.0s; Static standing balance without UE support x 30s,  Side-stepping in // bars with single RUE support 2 x 2 lengths;   PATIENT EDUCATION:  Education details: Pt educated throughout session about proper posture and technique with exercises. Improved exercise technique, movement at target joints, use of target muscles after min to mod verbal, visual, tactile cues. Person educated: Patient Education method: Explanation, Demonstration, and Verbal cues Education comprehension: verbalized understanding and returned demonstration   HOME EXERCISE PROGRAM:  Access Code: [email or web address] URL: [email or web address] Date: 05/14/2023 Prepared by: Ria Comment  Exercises - Mini Squat with Counter Support  - 1 x daily - 7 x weekly - 2 sets - 10 reps - Seated Long Arc Quad  - 1 x daily - 7 x weekly - 3 sets - 10 reps - 3s hold - Seated Hip Adduction Isometrics with Ball  - 1 x daily - 7 x weekly - 2 sets - 10 reps - 3s hold - Heel Raises with Counter Support  - 1 x daily - 7 x weekly - 2 sets - 10 reps - 3s hold   ASSESSMENT:  CLINICAL IMPRESSION: Pt demonstrates good motivation during session but requires more seated rest breaks due to excessive fatigue today. Progressed strengthening and performed TUG which is 86.0s today. Plan to perform additional outcome measures at next appointment to assess for chance. Pending results will consider recertification vs discharge. No HEP updates currently. Plan to progress strengthening at future sessions. She will benefit from a trial  of PT for strength, balance, and mobility to determine whether it will make functional improvement.  OBJECTIVE IMPAIRMENTS: Abnormal gait, decreased activity tolerance, decreased balance, decreased knowledge of condition, decreased mobility, difficulty walking, decreased strength, decreased safety awareness, impaired tone, impaired UE functional use, obesity, and pain.   ACTIVITY LIMITATIONS: carrying, lifting, bending, standing, squatting, stairs, transfers, bed mobility, bathing, toileting, and dressing  PARTICIPATION LIMITATIONS: meal prep, cleaning, laundry, medication management, driving, shopping, and community activity  PERSONAL FACTORS: Age, Behavior pattern, Past/current experiences, Time since onset of injury/illness/exacerbation, and 3+ comorbidities: DM, OP, CVA, neuropathy, cognitive impairment, MDD, mood disorder, and chronic pain  are also affecting patient's functional outcome.   REHAB POTENTIAL: Poor prior failed episodes of care  CLINICAL DECISION MAKING: Unstable/unpredictable  EVALUATION COMPLEXITY: High   GOALS: Goals reviewed with patient? No  SHORT TERM GOALS: Target date: 05/08/2023  Pt will be independent with HEP in order to improve strength and balance in order to decrease fall risk and improve function at home. Baseline:  Goal status: INITIAL   LONG TERM GOALS: Target date: 06/05/2023  Pt will increase FOTO to at least 43 to demonstrate significant improvement in function at home related to balance  Baseline: 04/10/23: 32 Goal status: INITIAL  2.  Pt will improve BERG by at least 3 points in order to demonstrate clinically significant improvement in balance.   Baseline: 04/10/23: 9/56 Goal status: INITIAL  3.  Pt will improve ABC by at least 13% in order to demonstrate clinically significant improvement in balance confidence.      Baseline: 04/10/23: To be completed Goal status: INITIAL  4. Pt will increase by at least 0.13 m/s in order to  demonstrate clinically significant improvement in household ambulation.       Baseline: 04/10/23: 0.06 m/s Goal status: INITIAL   PLAN: PT FREQUENCY: 2x/week  PT DURATION: 8 weeks  PLANNED INTERVENTIONS: Therapeutic exercises, Therapeutic activity, Neuromuscular re-education, Balance training, Gait training, Patient/Family education, Self Care, Joint mobilization, Joint manipulation, Vestibular training, Canalith repositioning, Orthotic/Fit training, DME instructions, Dry Needling, Electrical stimulation, Spinal manipulation, Spinal mobilization, Cryotherapy, Moist heat, Taping, Traction, Ultrasound, Ionotophoresis 4mg /ml Dexamethasone, Manual therapy, and Re-evaluation.  PLAN FOR NEXT SESSION: update outcome measures/goals/progress note, progress strengthening and issue HEP;  Sharalyn Ink Azarel Banner PT, DPT, GCS  10:11 AM,05/23/23

## 2023-05-22 NOTE — Discharge Summary (Signed)
Physician Discharge Summary   Patient: Ariel Alvarez MRN: [id number] DOB: 1945/11/17  Admit date:     12/13/2022  Discharge date: 12/14/2022  Discharge Physician: Delfino Lovett   PCP: Tawnya Crook, MD   Recommendations at discharge:    F/up with outpt providers as requested  Discharge Diagnoses: Active Problems:   COPD with acute bronchitis (HCC)   Acute respiratory failure with hypoxia (HCC)   Bleeding gums   History of pulmonary embolism   Chronic anticoagulation   Morbid obesity with BMI of 40.0-44.9, adult (HCC)   Hypothyroidism   GERD (gastroesophageal reflux disease)   Dyslipidemia   Chronic pain   Diabetes (HCC)   Rheumatoid arthritis [facility])  Hospital Course: Depression, class III obesity with BMI 40-44.9, diabetes, hypothyroidism, collagen vascular disease on Plaquenil, history of PE on Eliquis, spastic gait disturbance, who is being admitted for new oxygen requirement of 3 L.  Patient initially presented to the ED for bleeding gums following tooth extraction which successfully resolved with TXA applied in the ED.  She denies choking on blood but does endorse some wheezing and shortness of breath.  She was previously in her usual state of health and denies chest pain, cough, fever or chills. ED course and data review: BP 155/78, O2 sat 87% on room air requiring 3 L to maintain sats in the mid 90s.  Notable labs include WBC of 11,300.  Troponin 4 and BNP 25.9.  Respiratory viral panel negative for COVID flu and RSV. CTA chest consistent with COPD as follows: IMPRESSION: 1. No pulmonary embolism. 2. Bronchial wall thickening in keeping with airway inflammation, peripheral airway impaction, and multifocal air trapping all in keeping with changes of underlying COPD. 3. Mild coronary artery calcification.  Patient was initially treated with TXA soaked gauze with cessation of all bleeding from the mouth.  She was treated with a DuoNeb.  Hospitalist consulted for  admission.   Assessment and Plan: COPD with acute bronchitis (HCC) Acute respiratory failure with hypoxia Regarding hypoxia: CTA ruled out PE.  Shows airway inflammation typical of COPD.  No evidence of aspiration into airway.  Possible sleep apnea She was weaned off to room air and had rapid improvement. Possibly mucus plug resolved. CTA chest showing mucous plugging consistent with COPD  Bleeding gums Resolved with TXA soaked gauze in the ED Will hold dose of apixaban tonight  History of pulmonary embolism Chronic anticoagulation CTA negative for PE. Not having any further bleed while here with eliquis on hold resume apixaban on 12/17/22 if no further bleeding gums  Rheumatoid arthritis (HCC) Diabetes (HCC) Chronic pain Dyslipidemia GERD (gastroesophageal reflux disease) Hypothyroidism Morbid obesity with BMI of 40.0-44.9, adult (HCC) Potential complicating factor to overall prognosis and care          Disposition: Home Diet recommendation:  Discharge Diet Orders (From admission, onward)     Start     Ordered   12/14/22 0000  Diet - low sodium heart healthy        12/14/22 1258           Carb modified diet DISCHARGE MEDICATION: Allergies as of 12/14/2022       Reactions   Latex Hives   Prednisone Nausea And Vomiting   Pt states causes N/V even when taken with food.        Medication List     STOP taking these medications    fluconazole 150 MG tablet Commonly known as: Diflucan   metFORMIN 500 MG tablet Commonly known  as: GLUCOPHAGE   phenazopyridine 200 MG tablet Commonly known as: PYRIDIUM   tolterodine 2 MG 24 hr capsule Commonly known as: DETROL LA       TAKE these medications    acetaminophen 325 MG tablet Commonly known as: TYLENOL Take 325-650 mg by mouth every 6 (six) hours as needed for mild pain or moderate pain.   albuterol 108 (90 Base) MCG/ACT inhaler Commonly known as: VENTOLIN HFA Inhale 2 puffs into the lungs every  6 (six) hours as needed for wheezing or shortness of breath.   apixaban 5 MG Tabs tablet Commonly known as: ELIQUIS Take 1 tablet (5 mg total) by mouth 2 (two) times daily. Hold Eliquis till 12/16/22 and if no further bleeding, resume on 12/17/22 What changed: additional instructions   aspirin EC 81 MG tablet Take 1 tablet (81 mg total) by mouth at bedtime. Hold today. Resume on 12/15/22 if no further bleeding What changed: additional instructions   atorvastatin 40 MG tablet Commonly known as: LIPITOR Take 40 mg by mouth every morning.   cetirizine 10 MG tablet Commonly known as: ZYRTEC Take 10 mg by mouth daily after lunch.   clonazePAM 0.5 MG tablet Commonly known as: KLONOPIN Take 0.5 mg by mouth at bedtime as needed. What changed: Another medication with the same name was removed. Continue taking this medication, and follow the directions you see here.   clotrimazole 1 % cream Commonly known as: LOTRIMIN Apply to affected area 2 times daily What changed:  how much to take when to take this reasons to take this   desvenlafaxine 50 MG 24 hr tablet Commonly known as: PRISTIQ Take 50 mg by mouth daily.   dicyclomine 10 MG capsule Commonly known as: BENTYL Take 10 mg by mouth 2 (two) times daily.   folic acid 1 MG tablet Commonly known as: FOLVITE Take 1 mg by mouth daily.   hydroxychloroquine 200 MG tablet Commonly known as: PLAQUENIL Take 200 mg by mouth daily.   levothyroxine 100 MCG tablet Commonly known as: SYNTHROID Take 100 mcg by mouth daily before breakfast.   methocarbamol 500 MG tablet Commonly known as: ROBAXIN Take 500 mg by mouth at bedtime as needed for muscle spasms.   methotrexate 2.5 MG tablet Commonly known as: RHEUMATREX Take 17.5 mg by mouth every Wednesday.   nitrofurantoin (macrocrystal-monohydrate) 100 MG capsule Commonly known as: MACROBID Take 100 mg by mouth 2 (two) times daily.   nystatin powder Commonly known as:  MYCOSTATIN/NYSTOP Apply 1 Application topically 3 (three) times daily.   omeprazole 20 MG capsule Commonly known as: PRILOSEC Take 20 mg by mouth every morning.   QUEtiapine 50 MG tablet Commonly known as: SEROQUEL Take 1.5 tablets (75 mg total) by mouth at bedtime. Dose increase       ASK your doctor about these medications    rOPINIRole 0.5 MG tablet Commonly known as: REQUIP Take 0.5 mg by mouth at bedtime. Ask about: Should I take this medication?   Semaglutide(0.25 or 0.5MG /DOS) 2 MG/1.5ML Sopn Inject 0.5 mg into the skin every 7 (seven) days.  Inject 0.5 mg under the skin every seven (7) days. Sundays. Type 2 diabetes,wt loss mgmt, pt with bmi 27-2912/21/2023 11/17/2022 & wt-related comorbidity Ask about: Should I take this medication?        Follow-up Information     Tawnya Crook, MD. Schedule an appointment as soon as possible for a visit in 3 day(s).   Specialty: Internal Medicine Why: [facility] Discharge F/UP Contact  information: [address] [place] [address] [phone]                Discharge Exam: Ceasar Mons Weights   12/13/22 2223  Weight: 112.5 kg   Constitutional:      General: She is not in acute distress. HENT:     Head: Normocephalic and atraumatic.  Cardiovascular:     Rate and Rhythm: Normal rate and regular rhythm.     Heart sounds: Normal heart sounds.  Pulmonary:     Effort: Pulmonary effort is normal.  Abdominal:     Palpations: Abdomen is soft.     Tenderness: There is no abdominal tenderness.  Neurological:     Mental Status: Mental status is at baseline.  Condition at discharge: fair  The results of significant diagnostics from this hospitalization (including imaging, microbiology, ancillary and laboratory) are listed below for reference.   Imaging Studies: No results found.  Microbiology: Results for orders placed or performed during the hospital encounter of 12/13/22  Resp panel by RT-PCR  (RSV, Flu A&B, Covid) Anterior Nasal Swab     Status: None   Collection Time: 12/13/22 11:30 PM   Specimen: Anterior Nasal Swab  Result Value Ref Range Status   SARS Coronavirus 2 by RT PCR NEGATIVE NEGATIVE Final    Comment: (NOTE) SARS-CoV-2 target nucleic acids are NOT DETECTED.  The SARS-CoV-2 RNA is generally detectable in upper respiratory specimens during the acute phase of infection. The lowest concentration of SARS-CoV-2 viral copies this assay can detect is 138 copies/mL. A negative result does not preclude SARS-Cov-2 infection and should not be used as the sole basis for treatment or other patient management decisions. A negative result may occur with  improper specimen collection/handling, submission of specimen other than nasopharyngeal swab, presence of viral mutation(s) within the areas targeted by this assay, and inadequate number of viral copies(<138 copies/mL). A negative result must be combined with clinical observations, patient history, and epidemiological information. The expected result is Negative.  Fact Sheet for Patients:  BloggerCourse.com  Fact Sheet for Healthcare Providers:  SeriousBroker.it  This test is no t yet approved or cleared by the [place] FDA and  has been authorized for detection and/or diagnosis of SARS-CoV-2 by FDA under an Emergency Use Authorization (EUA). This EUA will remain  in effect (meaning this test can be used) for the duration of the COVID-19 declaration under Section 564(b)(1) of the Act, 21 U.S.C.section 360bbb-3(b)(1), unless the authorization is terminated  or revoked sooner.       Influenza A by PCR NEGATIVE NEGATIVE Final   Influenza B by PCR NEGATIVE NEGATIVE Final    Comment: (NOTE) The Xpert Xpress SARS-CoV-2/FLU/RSV plus assay is intended as an aid in the diagnosis of influenza from Nasopharyngeal swab specimens and should not be used as a sole basis for  treatment. Nasal washings and aspirates are unacceptable for Xpert Xpress SARS-CoV-2/FLU/RSV testing.  Fact Sheet for Patients: BloggerCourse.com  Fact Sheet for Healthcare Providers: SeriousBroker.it  This test is not yet approved or cleared by the [place] FDA and has been authorized for detection and/or diagnosis of SARS-CoV-2 by FDA under an Emergency Use Authorization (EUA). This EUA will remain in effect (meaning this test can be used) for the duration of the COVID-19 declaration under Section 564(b)(1) of the Act, 21 U.S.C. section 360bbb-3(b)(1), unless the authorization is terminated or revoked.     Resp Syncytial Virus by PCR NEGATIVE NEGATIVE Final    Comment: (  NOTE) Fact Sheet for Patients: BloggerCourse.com  Fact Sheet for Healthcare Providers: SeriousBroker.it  This test is not yet approved or cleared by the [place] FDA and has been authorized for detection and/or diagnosis of SARS-CoV-2 by FDA under an Emergency Use Authorization (EUA). This EUA will remain in effect (meaning this test can be used) for the duration of the COVID-19 declaration under Section 564(b)(1) of the Act, 21 U.S.C. section 360bbb-3(b)(1), unless the authorization is terminated or revoked.  Performed at [facility], [address] [place], [place] [address]      Discharge time spent: greater than 30 minutes.  Signed: Delfino Lovett, MD [facility] 05/22/2023

## 2023-05-23 ENCOUNTER — Ambulatory Visit: Payer: Medicare PPO

## 2023-05-23 DIAGNOSIS — M6281 Muscle weakness (generalized): Secondary | ICD-10-CM

## 2023-05-23 DIAGNOSIS — R262 Difficulty in walking, not elsewhere classified: Secondary | ICD-10-CM

## 2023-05-28 DIAGNOSIS — R262 Difficulty in walking, not elsewhere classified: Secondary | ICD-10-CM

## 2023-05-28 DIAGNOSIS — M6281 Muscle weakness (generalized): Secondary | ICD-10-CM

## 2023-06-04 NOTE — Therapy (Signed)
OUTPATIENT PHYSICAL THERAPY BALANCE TREATMENT/PROGRESS NOTE/DISCHARGE  Dates of reporting period  04/10/23   to   06/05/23   Patient Name: Ariel Alvarez MRN: [id number] DOB:[birth year], 77 y.o., female Today's Date: 06/05/2023  END OF SESSION:  PT End of Session - 06/05/23 0747     Visit Number 10    Number of Visits 17    Date for PT Re-Evaluation 06/05/23    Authorization Type eval: 04/10/23    PT Start Time 0800    PT Stop Time 0845    PT Time Calculation (min) 45 min    Activity Tolerance Patient limited by fatigue    Behavior During Therapy WFL for tasks assessed/performed            Past Medical History:  Diagnosis Date   Anxiety    Arthritis    RIGHT HAND   Collagen vascular disease (HCC)    Complication of anesthesia    HARD TO WAKE UP AFTER  ONE SURGERY-PT STAETS SHE WAS GIVEN TOO MUCH ANESTHESIA   Depression    Dyspnea    VERY RARE WITH EXERTION   GERD (gastroesophageal reflux disease)    History of kidney stones    H/O   Hypothyroidism    Stroke (HCC) 5366,4403   X2-LEFT HAD PARALYZED    Past Surgical History:  Procedure Laterality Date   CHOLECYSTECTOMY     KIDNEY STONE SURGERY     METATARSAL HEAD EXCISION Bilateral 04/15/2020   Procedure: METATARSAL HEAD PARTIAL EXCISION - BILATERAL;  Surgeon: Linus Galas, DPM;  Location: [facility];  Service: Podiatry;  Laterality: Bilateral;   Patient Active Problem List   Diagnosis Date Noted   Hypoxia 12/14/2022   COPD with acute bronchitis (HCC) 12/14/2022   History of pulmonary embolism 12/14/2022   Chronic anticoagulation 12/14/2022   Bleeding gums 12/14/2022   Rheumatoid arthritis (HCC) 12/06/2022   Type 2 diabetes mellitus without complication, without long-term current use of insulin (HCC) 05/21/2022   CAP (community acquired pneumonia) 01/31/2022   Diabetes (HCC) 01/31/2022   Acute respiratory failure with hypoxia (HCC) 01/31/2022   Weakness of both lower extremities 01/31/2022   Lactic acidosis  01/31/2022   Moderate episode of recurrent major depressive disorder (HCC) 10/03/2021   Cognitive disorder 10/03/2021   Episodic mood disorder (HCC) 10/03/2021   Psychosis (HCC) 10/03/2021   Gait disturbance 12/19/2020   Rash 07/25/2020   Increased frequency of urination 07/25/2020   Closed nondisplaced fracture of proximal phalanx of left little finger with routine healing 04/07/2020   Hyperlipidemia 12/08/2019   Stroke (HCC) 12/08/2019   Ulcerated, foot, unspecified laterality, limited to breakdown of skin (HCC) 12/08/2019   Other constipation 09/28/2019   Dysphagia 09/28/2019   Pelvic pain in female 05/18/2019   Age-related osteoporosis without current pathological fracture 08/08/2018   Urinary hesitancy 10/04/2017   Routine health maintenance 08/26/2017   Candidiasis 08/11/2017   Morbid obesity with BMI of 40.0-44.9, adult (HCC) 07/30/2017   Collagen vascular disease (HCC) 05/21/2017   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) 05/21/2017   Chronic pain 05/21/2017   Cerebrovascular 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) 05/06/2017   Preoperative clearance 05/06/2017   DVT (deep venous thrombosis) (HCC) 03/24/2017   Contracture of joint of left hand 02/17/2017   Other pulmonary embolism without acute cor pulmonale (HCC) 02/06/2017   Right pontine stroke (HCC) 02/03/2017   Vasculitis (HCC) 01/29/2017   Abnormal ANCA test 12/30/2016   Right carpal tunnel syndrome 04/06/2016  GERD (gastroesophageal reflux disease) 12/31/2014   Dyslipidemia 04/06/2014   Calculus of kidney and ureter 05/06/2012   Primary insomnia 07/06/2011   Hypothyroidism 07/06/2011   Depressive disorder 07/06/2011   Spastic hemiplegia affecting nondominant side (HCC) 03/27/2011   PCP: Tawnya Crook, MD  REFERRING PROVIDER: Tawnya Crook, MD   REFERRING DIAG: [id number] (ICD-10-CM) - Personal history of transient ischemic attack (TIA), and cerebral infarction without residual  deficits, R26.9 (ICD-10-CM) - Unspecified abnormalities of gait and mobility  RATIONALE FOR EVALUATION AND TREATMENT: Rehabilitation     THERAPY DIAG: Difficulty in walking, not elsewhere classified  Muscle weakness (generalized)  ONSET DATE: 2012  FOLLOW-UP APPT SCHEDULED WITH REFERRING PROVIDER: Yes, next month  FROM INITIAL EVALUATION SUBJECTIVE:                                                                                                                                                                                         SUBJECTIVE STATEMENT:  "I want to be able to walk and stand on my own. I want to be able to do stairs."  PERTINENT HISTORY:  Pt referred for physical therapy for mobility deficits related to multiple chronic strokes with spastic hemiplegia affecting her nondominant side (LUE>LLE). She lives in a single story home (ramp entry) with her spouse. Bathroom has walk-in shower with grabs bars, built-in seat and handheld shower, and ADA height toilet w/grab bars. She currently ambulates limited distances at home with a hemiwalker. She has a history of repeated bouts of physical therapy including multiple episodes with this particular therapist. Most recently she received HH PT from October 2023 through January 2024. Her work with [facility] was limited due to complaints of foot pain and intolerance to activity. She did not reach her goals and pt states that she has not been consistent with her HEP since discharge. Per patient "they didn't do anything for me." Her HH PT notes are very detailed and it appears that patient's limitations are related to medical complications, pain, and a lack of compliance. She made very little progress improving from maxA with most mobility to modA. This is consistent with her prior episodes with this therapy where little to no progress was achieved. Pt denies an recent change in her baseline function. Pt stated goals are, "I want to be able to walk and  stand on my own. I want to be able to do stairs." These have been her goals for many years and she has been unable to achieve them in the past. Her medical history is complex and her PMH includes morbid obesity, RA, COPD, DM, peripheral neuropathy, MDD, mood disorder, cognitive disorder, dysphagia, vasculitis,  osteoporosis, CVA, and chronic pain among other ongoing medical issues.    Pain: No Numbness/Tingling: Yes, numbness in LUE/LLE, Hx of peripheral neuropathy; Focal Weakness: Yes, L side weakness since CVA  Recent changes in overall health/medication: No Prior history of physical therapy for balance:  Yes, extensive history of outpatient and HH PT. Dominant hand: right Red flags: Negative for bowel/bladder changes, saddle paresthesia, personal history of cancer, h/o spinal tumors, h/o compression fx, h/o abdominal aneurysm, abdominal pain, chills/fever, night sweats, nausea, vomiting, unrelenting pain  PRECAUTIONS: None  WEIGHT BEARING RESTRICTIONS: No  FALLS: Has patient fallen in last 6 months? Yes. Number of falls 1  Living Environment Lives with: lives with an adult companion "significant other x 30 years" Lives in: House/apartment, one level; Stairs:  4 steps to enter, pt uses ramp; Has following equipment at home: Hemi walker, bed side commode, and walkin-shower with grab bars and seat;  Prior level of function: Needs assistance with ADLs and Needs assistance with homemaking  Occupational demands: Not working  Hobbies: Reading  Patient Goals: "I want to be able to walk and stand on my own. I want to be able to do stairs."   OBJECTIVE:   Patient Surveys  FOTO: 32, predicted improvement to 64 ABC: To be completed  Cognition Patient is oriented to person, place, and time.  Recent memory is intact.  Remote memory is intact.  Attention span and concentration are intact.  Expressive speech is intact.  Patient's fund of knowledge is within normal limits for educational  level. She appears to have poor insight into her potential to improve given failure to improve with physical therapy over multiple years and multiple episodes of care.    Gross Musculoskeletal Assessment Tremor: None Bulk: Decreased muscle bulk in LUE Tone: Spastic hemiplegia LUE>LLE;  Posture: Hemplegic LUE resting at patient's side;  AROM Deferred  LE MMT: MMT (out of 5) Right  Left   Hip flexion 5 3+  Hip extension    Hip abduction    Hip adduction    Hip internal rotation    Hip external rotation    Knee flexion 5 4+  Knee extension 5 3+  Ankle dorsiflexion 5 3+  (* = pain; Blank rows = not tested)  Sensation Diminished throughout entire LUE/LLE to light touch. Proprioception, stereognosis, and hot/cold testing deferred on this date.  Reflexes Deferred  Cranial Nerves Deferred  Coordination/Cerebellar Deferred  Bed mobility: Not tested  Transfers: Assistive device utilized: Hemi walker  Sit to stand: Min A Stand to sit: Mod A Chair to chair:  ModA+1 to transfer chair to chair; Floor:  Deferred  Curb:  Curb Comments: Deferred  Stairs: Comments: Unsafe to attempt  Gait: Gait pattern: step to pattern, decreased step length- Right, decreased step length- Left, decreased stance time- Right, decreased stance time- Left, and decreased ankle dorsiflexion- Left Distance walked:  Assistive device utilized: Hemi walker Level of assistance: Modified independence Comments: Pt ambulates very slowly with hemiwalker in RUE and step-to pattern. Spasticity noted in L ankle with absent heel strike and limited dorsiflexion range of motion;  Functional Outcome Measures  Results 04/18/23 05/23/23 Comments  BERG 9/56   High risk of falls  TUG Unsafe to attempt  86.0s;   5TSTS Unable     6 Minute Walk Test Unable to attempt : 36' with hemi-walker in RUE, multiple seated rest breaks required;     76m walk speed Fastest: 47.8s (10') = 0.06 m/s   Very slow and not  functional for household or community mobility   (Blank rows = not tested)   TODAY'S TREATMENT    SUBJECTIVE: Pt reports some R inner thigh pain today. She states that it has been hurting since the afternoon following her last physical therapy treatment session. She believes that she is moving better since starting with therapy. No specific questions currently.   PAIN: BLE neuropathy (chronic), R inner thigh pain (not rated);   Neuromuscular Re-education  NuStep L0 x 4 minutes for warm-up during interval history, therapist monitoring throughout;  Updated outcome measures with patient: : 4' with hemi-walker in RUE, multiple seated rest breaks required;  5TSTS: Unable to stand without heavy RUE assist, multiple tries, and minA+1 from therapist  37m walk speed: fastest: 120.7s = 0.08 m/s,  BERG: 7/56 FOTO: 42 Extensive education with patient and partner regarding HEP and discharge   PATIENT EDUCATION:  Education details: Outcome measures, discharge Person educated: Patient and Spouse Education method: Explanation, Demonstration, and Verbal cues Education comprehension: verbalized understanding and returned demonstration   HOME EXERCISE PROGRAM:  Access Code: [email or web address] URL: [email or web address] Date: 05/14/2023 Prepared by: Ria Comment  Exercises - Mini Squat with Counter Support  - 1 x daily - 7 x weekly - 2 sets - 10 reps - Seated Long Arc Quad  - 1 x daily - 7 x weekly - 3 sets - 10 reps - 3s hold - Seated Hip Adduction Isometrics with Ball  - 1 x daily - 7 x weekly - 2 sets - 10 reps - 3s hold - Heel Raises with Counter Support  - 1 x daily - 7 x weekly - 2 sets - 10 reps - 3s hold   ASSESSMENT:  CLINICAL IMPRESSION: Updated outcome measures with patient during session today. Subjectively, pt reports moving better since starting with therapy however after 10 visits over 2 months her outcome measures do not show a clinically significant  improvement. She is still unable to perform a sit to stand from a regular height chair without using her RUE and requiring minA+1 external assistance. Her TUG was 86s when updated at last session. Her BERG is 7/56 placing her at a very high risk for falls. Her walking speed is below what is necessary for limited functional household or community ambulation. Pt has been seen by this therapist multiple times over the span of many years and has not demonstrated improvement despite physical therapy intervention. She does not meet medical necessity to continue therapy at this time. Pt encouraged to continue exercising at home and to remain active. Pt advised to return for reassessment if she experiences a decline in strength or function. Pt will be discharged at this time.  OBJECTIVE IMPAIRMENTS: Abnormal gait, decreased activity tolerance, decreased balance, decreased knowledge of condition, decreased mobility, difficulty walking, decreased strength, decreased safety awareness, impaired tone, impaired UE functional use, obesity, and pain.   ACTIVITY LIMITATIONS: carrying, lifting, bending, standing, squatting, stairs, transfers, bed mobility, bathing, toileting, and dressing  PARTICIPATION LIMITATIONS: meal prep, cleaning, laundry, medication management, driving, shopping, and community activity  PERSONAL FACTORS: Age, Behavior pattern, Past/current experiences, Time since onset of injury/illness/exacerbation, and 3+ comorbidities: DM, OP, CVA, neuropathy, cognitive impairment, MDD, mood disorder, and chronic pain  are also affecting patient's functional outcome.   REHAB POTENTIAL: Poor prior failed episodes of care  CLINICAL DECISION MAKING: Unstable/unpredictable  EVALUATION COMPLEXITY: High   GOALS: Goals reviewed with patient? No  SHORT TERM GOALS: Target date: 05/08/2023  Pt will be independent with HEP in order  to improve strength and balance in order to decrease fall risk and improve function  at home. Baseline:  Goal status: INITIAL   LONG TERM GOALS: Target date: 06/05/2023  Pt will increase FOTO to at least 43 to demonstrate significant improvement in function at home related to balance  Baseline: 04/10/23: 32; 06/05/23: 42 Goal status: PARTIALLY MET  2.  Pt will improve BERG by at least 3 points in order to demonstrate clinically significant improvement in balance.   Baseline: 04/10/23: 9/56; 06/05/23: 7/56 Goal status: NOT MET  3.  Pt will improve ABC by at least 13% in order to demonstrate clinically significant improvement in balance confidence.      Baseline: 04/10/23: To be completed, 06/05/23: Discontinued due to concerns over reliable subjective reports Goal status: DISCONTINUED  4. Pt will increase by at least 0.13 m/s in order to demonstrate clinically significant improvement in household ambulation.       Baseline: 04/10/23: 0.06 m/s; 06/05/23: 0.08 m/s Goal status: NOT MET   PLAN: PT FREQUENCY: 2x/week  PT DURATION: 8 weeks  PLANNED INTERVENTIONS: Therapeutic exercises, Therapeutic activity, Neuromuscular re-education, Balance training, Gait training, Patient/Family education, Self Care, Joint mobilization, Joint manipulation, Vestibular training, Canalith repositioning, Orthotic/Fit training, DME instructions, Dry Needling, Electrical stimulation, Spinal manipulation, Spinal mobilization, Cryotherapy, Moist heat, Taping, Traction, Ultrasound, Ionotophoresis 4mg /ml Dexamethasone, Manual therapy, and Re-evaluation.  PLAN FOR NEXT SESSION: Discharge  Sharalyn Ink Jireh Vinas PT, DPT, GCS  1:29 PM,06/05/23

## 2023-06-05 ENCOUNTER — Ambulatory Visit: Payer: Medicare PPO | Attending: Internal Medicine

## 2023-06-05 DIAGNOSIS — A4189 Other specified sepsis: Secondary | ICD-10-CM | POA: Diagnosis not present

## 2023-06-05 DIAGNOSIS — M6281 Muscle weakness (generalized): Secondary | ICD-10-CM | POA: Insufficient documentation

## 2023-06-05 DIAGNOSIS — U071 COVID-19: Secondary | ICD-10-CM | POA: Diagnosis not present

## 2023-06-05 DIAGNOSIS — R262 Difficulty in walking, not elsewhere classified: Secondary | ICD-10-CM | POA: Insufficient documentation

## 2023-06-08 ENCOUNTER — Encounter: Payer: Self-pay | Admitting: Emergency Medicine

## 2023-06-08 ENCOUNTER — Inpatient Hospital Stay
Admission: EM | Admit: 2023-06-08 | Discharge: 2023-06-10 | DRG: 871 | Disposition: A | Discharge status: Home Health | Payer: Medicare PPO | Attending: Osteopathic Medicine | Admitting: Osteopathic Medicine

## 2023-06-08 ENCOUNTER — Other Ambulatory Visit: Payer: Self-pay

## 2023-06-08 ENCOUNTER — Emergency Department: Payer: Medicare PPO

## 2023-06-08 DIAGNOSIS — Z79890 Hormone replacement therapy (postmenopausal): Secondary | ICD-10-CM

## 2023-06-08 DIAGNOSIS — E039 Hypothyroidism, unspecified: Secondary | ICD-10-CM | POA: Diagnosis present

## 2023-06-08 DIAGNOSIS — Z86718 Personal history of other venous thrombosis and embolism: Secondary | ICD-10-CM | POA: Diagnosis not present

## 2023-06-08 DIAGNOSIS — Z833 Family history of diabetes mellitus: Secondary | ICD-10-CM

## 2023-06-08 DIAGNOSIS — J9811 Atelectasis: Secondary | ICD-10-CM | POA: Diagnosis present

## 2023-06-08 DIAGNOSIS — M359 Systemic involvement of connective tissue, unspecified: Secondary | ICD-10-CM | POA: Diagnosis present

## 2023-06-08 DIAGNOSIS — Z79631 Long term (current) use of antimetabolite agent: Secondary | ICD-10-CM | POA: Diagnosis not present

## 2023-06-08 DIAGNOSIS — R6520 Severe sepsis without septic shock: Secondary | ICD-10-CM | POA: Diagnosis present

## 2023-06-08 DIAGNOSIS — A419 Sepsis, unspecified organism: Secondary | ICD-10-CM

## 2023-06-08 DIAGNOSIS — Z7982 Long term (current) use of aspirin: Secondary | ICD-10-CM | POA: Diagnosis not present

## 2023-06-08 DIAGNOSIS — Z811 Family history of alcohol abuse and dependence: Secondary | ICD-10-CM

## 2023-06-08 DIAGNOSIS — Z87891 Personal history of nicotine dependence: Secondary | ICD-10-CM | POA: Diagnosis not present

## 2023-06-08 DIAGNOSIS — E1142 Type 2 diabetes mellitus with diabetic polyneuropathy: Secondary | ICD-10-CM | POA: Diagnosis present

## 2023-06-08 DIAGNOSIS — U071 COVID-19: Secondary | ICD-10-CM | POA: Diagnosis present

## 2023-06-08 DIAGNOSIS — Z86711 Personal history of pulmonary embolism: Secondary | ICD-10-CM

## 2023-06-08 DIAGNOSIS — E785 Hyperlipidemia, unspecified: Secondary | ICD-10-CM | POA: Diagnosis present

## 2023-06-08 DIAGNOSIS — F419 Anxiety disorder, unspecified: Secondary | ICD-10-CM | POA: Diagnosis present

## 2023-06-08 DIAGNOSIS — Z87442 Personal history of urinary calculi: Secondary | ICD-10-CM

## 2023-06-08 DIAGNOSIS — E669 Obesity, unspecified: Secondary | ICD-10-CM | POA: Diagnosis present

## 2023-06-08 DIAGNOSIS — Z79899 Other long term (current) drug therapy: Secondary | ICD-10-CM | POA: Diagnosis not present

## 2023-06-08 DIAGNOSIS — J1282 Pneumonia due to coronavirus disease 2019: Secondary | ICD-10-CM | POA: Diagnosis present

## 2023-06-08 DIAGNOSIS — A4189 Other specified sepsis: Principal | ICD-10-CM | POA: Diagnosis present

## 2023-06-08 DIAGNOSIS — M1990 Unspecified osteoarthritis, unspecified site: Secondary | ICD-10-CM | POA: Diagnosis present

## 2023-06-08 DIAGNOSIS — Z7901 Long term (current) use of anticoagulants: Secondary | ICD-10-CM | POA: Diagnosis not present

## 2023-06-08 DIAGNOSIS — J189 Pneumonia, unspecified organism: Secondary | ICD-10-CM

## 2023-06-08 DIAGNOSIS — I69354 Hemiplegia and hemiparesis following cerebral infarction affecting left non-dominant side: Secondary | ICD-10-CM

## 2023-06-08 DIAGNOSIS — J9601 Acute respiratory failure with hypoxia: Secondary | ICD-10-CM | POA: Diagnosis present

## 2023-06-08 DIAGNOSIS — Z6841 Body Mass Index (BMI) 40.0 and over, adult: Secondary | ICD-10-CM | POA: Diagnosis not present

## 2023-06-08 DIAGNOSIS — G8114 Spastic hemiplegia affecting left nondominant side: Secondary | ICD-10-CM | POA: Diagnosis not present

## 2023-06-08 DIAGNOSIS — F32A Depression, unspecified: Secondary | ICD-10-CM | POA: Diagnosis present

## 2023-06-08 DIAGNOSIS — Z9049 Acquired absence of other specified parts of digestive tract: Secondary | ICD-10-CM

## 2023-06-08 DIAGNOSIS — K219 Gastro-esophageal reflux disease without esophagitis: Secondary | ICD-10-CM | POA: Diagnosis present

## 2023-06-08 DIAGNOSIS — Z823 Family history of stroke: Secondary | ICD-10-CM

## 2023-06-08 DIAGNOSIS — Z91040 Latex allergy status: Secondary | ICD-10-CM

## 2023-06-08 DIAGNOSIS — Z888 Allergy status to other drugs, medicaments and biological substances status: Secondary | ICD-10-CM

## 2023-06-08 DIAGNOSIS — Z818 Family history of other mental and behavioral disorders: Secondary | ICD-10-CM

## 2023-06-08 LAB — CBC WITH DIFFERENTIAL/PLATELET
Abs Immature Granulocytes: 0.02 10*3/uL (ref 0.00–0.07)
Basophils Absolute: 0.1 10*3/uL (ref 0.0–0.1)
Basophils Relative: 1 %
Eosinophils Absolute: 0.1 10*3/uL (ref 0.0–0.5)
Eosinophils Relative: 1 %
HCT: 40.3 % (ref 36.0–46.0)
Hemoglobin: 12.4 g/dL (ref 12.0–15.0)
Immature Granulocytes: 0 %
Lymphocytes Relative: 9 %
Lymphs Abs: 0.8 10*3/uL (ref 0.7–4.0)
MCH: 29.3 pg (ref 26.0–34.0)
MCHC: 30.8 g/dL (ref 30.0–36.0)
MCV: 95.3 fL (ref 80.0–100.0)
Monocytes Absolute: 0.5 10*3/uL (ref 0.1–1.0)
Monocytes Relative: 6 %
Neutro Abs: 7.6 10*3/uL (ref 1.7–7.7)
Neutrophils Relative %: 83 %
Platelets: 225 10*3/uL (ref 150–400)
RBC: 4.23 MIL/uL (ref 3.87–5.11)
RDW: 17.5 % — ABNORMAL HIGH (ref 11.5–15.5)
WBC: 9.1 10*3/uL (ref 4.0–10.5)
nRBC: 0 % (ref 0.0–0.2)

## 2023-06-08 LAB — COMPREHENSIVE METABOLIC PANEL
ALT: 31 U/L (ref 0–44)
AST: 40 U/L (ref 15–41)
Albumin: 3.2 g/dL — ABNORMAL LOW (ref 3.5–5.0)
Alkaline Phosphatase: 79 U/L (ref 38–126)
Anion gap: 7 (ref 5–15)
BUN: 8 mg/dL (ref 8–23)
CO2: 26 mmol/L (ref 22–32)
Calcium: 8.4 mg/dL — ABNORMAL LOW (ref 8.9–10.3)
Chloride: 103 mmol/L (ref 98–111)
Creatinine, Ser: 0.62 mg/dL (ref 0.44–1.00)
GFR, Estimated: >60 mL/min (ref >60)
Glucose, Bld: 134 mg/dL — ABNORMAL HIGH (ref 70–99)
Potassium: 4.3 mmol/L (ref 3.5–5.1)
Sodium: 136 mmol/L (ref 135–145)
Total Bilirubin: 1.1 mg/dL (ref 0.3–1.2)
Total Protein: 6.3 g/dL — ABNORMAL LOW (ref 6.5–8.1)

## 2023-06-08 LAB — LACTIC ACID, PLASMA: Lactic Acid, Venous: 1.2 mmol/L (ref 0.5–1.9)

## 2023-06-08 LAB — SARS CORONAVIRUS 2 BY RT PCR: SARS Coronavirus 2 by RT PCR: POSITIVE — AB

## 2023-06-08 MED ORDER — ZINC SULFATE 220 (50 ZN) MG PO CAPS
220.0000 mg | ORAL_CAPSULE | Freq: Every day | ORAL | Status: DC
  Administered 2023-06-09 – 2023-06-10 (×2): 220 mg via ORAL
  Filled 2023-06-08 (×2): qty 1

## 2023-06-08 MED ORDER — VENLAFAXINE HCL ER 150 MG PO CP24
150.0000 mg | ORAL_CAPSULE | Freq: Every day | ORAL | Status: DC
  Administered 2023-06-09 – 2023-06-10 (×2): 150 mg via ORAL
  Filled 2023-06-08 (×2): qty 1

## 2023-06-08 MED ORDER — PANTOPRAZOLE SODIUM 40 MG PO TBEC
40.0000 mg | DELAYED_RELEASE_TABLET | Freq: Every day | ORAL | Status: DC
  Administered 2023-06-09 – 2023-06-10 (×2): 40 mg via ORAL
  Filled 2023-06-08 (×2): qty 1

## 2023-06-08 MED ORDER — TRAZODONE HCL 50 MG PO TABS
25.0000 mg | ORAL_TABLET | Freq: Every evening | ORAL | Status: DC | PRN
  Administered 2023-06-09: 25 mg via ORAL
  Filled 2023-06-08: qty 1

## 2023-06-08 MED ORDER — SODIUM CHLORIDE 0.9 % IV BOLUS
1000.0000 mL | Freq: Once | INTRAVENOUS | Status: AC
  Administered 2023-06-08: 1000 mL via INTRAVENOUS

## 2023-06-08 MED ORDER — KETOROLAC TROMETHAMINE 15 MG/ML IJ SOLN
15.0000 mg | Freq: Once | INTRAMUSCULAR | Status: AC
  Administered 2023-06-08: 15 mg via INTRAVENOUS
  Filled 2023-06-08: qty 1

## 2023-06-08 MED ORDER — ASPIRIN 81 MG PO TBEC
81.0000 mg | DELAYED_RELEASE_TABLET | Freq: Every day | ORAL | Status: DC
  Administered 2023-06-09 – 2023-06-10 (×2): 81 mg via ORAL
  Filled 2023-06-08 (×2): qty 1

## 2023-06-08 MED ORDER — SODIUM CHLORIDE 0.9 % IV SOLN
100.0000 mg | Freq: Every day | INTRAVENOUS | Status: DC
  Administered 2023-06-10: 100 mg via INTRAVENOUS
  Filled 2023-06-08: qty 20

## 2023-06-08 MED ORDER — LEVOTHYROXINE SODIUM 50 MCG PO TABS
100.0000 ug | ORAL_TABLET | Freq: Every day | ORAL | Status: DC
  Administered 2023-06-09 – 2023-06-10 (×2): 100 ug via ORAL
  Filled 2023-06-08 (×2): qty 2

## 2023-06-08 MED ORDER — SODIUM CHLORIDE 0.9 % IV SOLN
100.0000 mg | Freq: Once | INTRAVENOUS | Status: AC
  Administered 2023-06-09: 100 mg via INTRAVENOUS
  Filled 2023-06-08: qty 20

## 2023-06-08 MED ORDER — IPRATROPIUM-ALBUTEROL 0.5-2.5 (3) MG/3ML IN SOLN: 3.0000 mL | Freq: Four times a day (QID) | RESPIRATORY_TRACT | Status: DC

## 2023-06-08 MED ORDER — QUETIAPINE FUMARATE 25 MG PO TABS
50.0000 mg | ORAL_TABLET | Freq: Every day | ORAL | Status: DC
  Administered 2023-06-09: 50 mg via ORAL
  Filled 2023-06-08: qty 2

## 2023-06-08 MED ORDER — GUAIFENESIN ER 600 MG PO TB12
600.0000 mg | ORAL_TABLET | Freq: Two times a day (BID) | ORAL | Status: DC
  Administered 2023-06-09 – 2023-06-10 (×3): 600 mg via ORAL
  Filled 2023-06-08 (×4): qty 1

## 2023-06-08 MED ORDER — SODIUM CHLORIDE 0.9 % IV SOLN
500.0000 mg | INTRAVENOUS | Status: DC
  Administered 2023-06-09 (×2): 500 mg via INTRAVENOUS
  Filled 2023-06-08 (×2): qty 5

## 2023-06-08 MED ORDER — SODIUM CHLORIDE 0.9 % IV SOLN: INTRAVENOUS | Status: DC

## 2023-06-08 MED ORDER — HYDROXYCHLOROQUINE SULFATE 200 MG PO TABS: 200.0000 mg | ORAL_TABLET | Freq: Two times a day (BID) | ORAL | Status: DC

## 2023-06-08 MED ORDER — CLONAZEPAM 0.5 MG PO TABS: 0.5000 mg | ORAL_TABLET | Freq: Every evening | ORAL | Status: DC | PRN

## 2023-06-08 MED ORDER — ACETAMINOPHEN 325 MG PO TABS
650.0000 mg | ORAL_TABLET | Freq: Four times a day (QID) | ORAL | Status: DC | PRN
  Administered 2023-06-09 – 2023-06-10 (×2): 650 mg via ORAL
  Filled 2023-06-08 (×2): qty 2

## 2023-06-08 MED ORDER — ATORVASTATIN CALCIUM 20 MG PO TABS
40.0000 mg | ORAL_TABLET | Freq: Every day | ORAL | Status: DC
  Administered 2023-06-09 – 2023-06-10 (×2): 40 mg via ORAL
  Filled 2023-06-08 (×2): qty 2

## 2023-06-08 MED ORDER — METHOTREXATE SODIUM 2.5 MG PO TABS: 17.5000 mg | ORAL_TABLET | ORAL | Status: DC

## 2023-06-08 MED ORDER — DICYCLOMINE HCL 10 MG PO CAPS: 10.0000 mg | ORAL_CAPSULE | Freq: Two times a day (BID) | ORAL | Status: DC

## 2023-06-08 MED ORDER — ONDANSETRON HCL 4 MG PO TABS: 4.0000 mg | ORAL_TABLET | Freq: Four times a day (QID) | ORAL | Status: DC | PRN

## 2023-06-08 MED ORDER — VITAMIN C 500 MG PO TABS
1000.0000 mg | ORAL_TABLET | Freq: Every day | ORAL | Status: DC
  Administered 2023-06-09 – 2023-06-10 (×2): 1000 mg via ORAL
  Filled 2023-06-08 (×2): qty 2

## 2023-06-08 MED ORDER — METHOCARBAMOL 500 MG PO TABS
500.0000 mg | ORAL_TABLET | Freq: Every evening | ORAL | Status: DC | PRN
  Administered 2023-06-10: 500 mg via ORAL
  Filled 2023-06-08: qty 1

## 2023-06-08 MED ORDER — LORATADINE 10 MG PO TABS
10.0000 mg | ORAL_TABLET | Freq: Every day | ORAL | Status: DC
  Administered 2023-06-09 – 2023-06-10 (×2): 10 mg via ORAL
  Filled 2023-06-08 (×2): qty 1

## 2023-06-08 MED ORDER — METHYLPREDNISOLONE SODIUM SUCC 125 MG IJ SOLR
1.0000 mg/kg | Freq: Two times a day (BID) | INTRAMUSCULAR | Status: DC
  Administered 2023-06-09 – 2023-06-10 (×4): 120 mg via INTRAVENOUS
  Filled 2023-06-08 (×4): qty 2

## 2023-06-08 MED ORDER — ALBUTEROL SULFATE HFA 108 (90 BASE) MCG/ACT IN AERS: 2.0000 | INHALATION_SPRAY | RESPIRATORY_TRACT | Status: DC | PRN

## 2023-06-08 MED ORDER — FOLIC ACID 1 MG PO TABS
1.0000 mg | ORAL_TABLET | Freq: Every day | ORAL | Status: DC
  Administered 2023-06-09 – 2023-06-10 (×2): 1 mg via ORAL
  Filled 2023-06-08 (×2): qty 1

## 2023-06-08 MED ORDER — ACETAMINOPHEN 650 MG RE SUPP: 650.0000 mg | Freq: Four times a day (QID) | RECTAL | Status: DC | PRN

## 2023-06-08 MED ORDER — SODIUM CHLORIDE 0.9 % IV SOLN
2.0000 g | INTRAVENOUS | Status: DC
  Administered 2023-06-09 (×2): 2 g via INTRAVENOUS
  Filled 2023-06-08 (×2): qty 20

## 2023-06-08 MED ORDER — APIXABAN 5 MG PO TABS
5.0000 mg | ORAL_TABLET | Freq: Two times a day (BID) | ORAL | Status: DC
  Administered 2023-06-09 – 2023-06-10 (×3): 5 mg via ORAL
  Filled 2023-06-08 (×3): qty 1

## 2023-06-08 MED ORDER — PREDNISONE 50 MG PO TABS: 50.0000 mg | ORAL_TABLET | Freq: Every day | ORAL | Status: DC

## 2023-06-08 MED ORDER — ONDANSETRON HCL 4 MG/2ML IJ SOLN
4.0000 mg | Freq: Four times a day (QID) | INTRAMUSCULAR | Status: DC | PRN
  Administered 2023-06-09 – 2023-06-10 (×2): 4 mg via INTRAVENOUS
  Filled 2023-06-08 (×2): qty 2

## 2023-06-08 MED ORDER — MAGNESIUM HYDROXIDE 400 MG/5ML PO SUSP
30.0000 mL | Freq: Every day | ORAL | Status: DC | PRN
  Administered 2023-06-09: 30 mL via ORAL
  Filled 2023-06-08: qty 30

## 2023-06-08 MED ORDER — HYDROCOD POLI-CHLORPHE POLI ER 10-8 MG/5ML PO SUER: 5.0000 mL | Freq: Two times a day (BID) | ORAL | Status: DC | PRN

## 2023-06-08 NOTE — H&P (Incomplete)
[place]   PATIENT NAME: Ariel Alvarez    MR#:  [id number]  DATE OF BIRTH:  Sep 28, 1946  DATE OF ADMISSION:  06/08/2023  PRIMARY CARE PHYSICIAN: Tawnya Crook, MD   Patient is coming from: Home  REQUESTING/REFERRING PHYSICIAN: Trinna Post, MD  CHIEF COMPLAINT:   Chief Complaint  Patient presents with   Cough    HISTORY OF PRESENT ILLNESS:  Ariel Alvarez is a 77 y.o. Caucasian female with medical history significant for anxiety, osteoarthritis, depression, GERD, hypothyroidism and CVA with left-sided AV plegia, who presented to the emergency room with acute onset of worsening dyspnea with associated cough productive of clear sputum as well as wheezing over the last 3 days.  She admits to nausea without vomiting or diarrhea.  She has been having loss of taste with intact smell.  She admitted to fever and chills.  No chest pain or palpitations.  No dysuria, oliguria or hematuria or flank pain.  She does not recall any sick exposures.  ED Course: When she came to the ER, temperature was 100.1 and respiratory rate was 22 with pulse symmetry of 86% on room air.  Vital signs were otherwise normal.  Labs revealed calcium 8.4 and glucose 134 with albumin 3.2 and total protein 6.3 with otherwise unremarkable CMP.  Lactic acid was 1.2 and CBC was unremarkable.  COVID-19 PCR came back positive.  Blood cultures were drawn. EKG as reviewed by me : None Imaging: Two-view chest x-ray showed mildly prominent left basal interstitial markings that can reflect atelectasis or developing infiltrate.  The patient was started on IV remdesivir after pharmacy consult and will be started on IV Solu-Medrol.  She was given 1 L bolus of IV normal saline and 15 mg of IV Toradol.  She will be admitted to a medical telemetry bed for further evaluation and management. PAST MEDICAL HISTORY:   Past Medical History:  Diagnosis Date   Anxiety    Arthritis    RIGHT HAND   Collagen vascular disease (HCC)     Complication of anesthesia    HARD TO WAKE UP AFTER  ONE SURGERY-PT STAETS SHE WAS GIVEN TOO MUCH ANESTHESIA   Depression    Dyspnea    VERY RARE WITH EXERTION   GERD (gastroesophageal reflux disease)    History of kidney stones    H/O   Hypothyroidism    Stroke (HCC) 3664,4034   X2-LEFT HAD PARALYZED     PAST SURGICAL HISTORY:   Past Surgical History:  Procedure Laterality Date   CHOLECYSTECTOMY     KIDNEY STONE SURGERY     METATARSAL HEAD EXCISION Bilateral 04/15/2020   Procedure: METATARSAL HEAD PARTIAL EXCISION - BILATERAL;  Surgeon: Linus Galas, DPM;  Location: [facility] ORS;  Service: Podiatry;  Laterality: Bilateral;    SOCIAL HISTORY:   Social History   Tobacco Use   Smoking status: Former    Current packs/day: 0.00    Average packs/day: 1 pack/day for 20.0 years (20.0 ttl pk-yrs)    Types: Cigarettes    Start date: 10/29/1954    Quit date: 10/29/1974    Years since quitting: 48.6   Smokeless tobacco: Never  Substance Use Topics   Alcohol use: Not Currently    Comment: WINE OCC    FAMILY HISTORY:   Family History  Problem Relation Age of Onset   Alcohol abuse Father    Depression Father    Suicidality Father    Stroke Paternal Grandmother    Diabetes  Son    Cancer Paternal Aunt     DRUG ALLERGIES:   Allergies  Allergen Reactions   Latex Hives   Prednisone Nausea And Vomiting    Pt states causes N/V even when taken with food.    REVIEW OF SYSTEMS:   ROS As per history of present illness. All pertinent systems were reviewed above. Constitutional, HEENT, cardiovascular, respiratory, GI, GU, musculoskeletal, neuro, psychiatric, endocrine, integumentary and hematologic systems were reviewed and are otherwise negative/unremarkable except for positive findings mentioned above in the HPI.   MEDICATIONS AT HOME:   Prior to Admission medications   Medication Sig Start Date End Date Taking? Authorizing Provider  acetaminophen (TYLENOL) 325 MG tablet Take  325-650 mg by mouth every 6 (six) hours as needed for mild pain or moderate pain.    [provider]  albuterol (VENTOLIN HFA) 108 (90 Base) MCG/ACT inhaler Inhale 2 puffs into the lungs every 6 (six) hours as needed for wheezing or shortness of breath.    [provider]  apixaban (ELIQUIS) 5 MG TABS tablet Take 1 tablet (5 mg total) by mouth 2 (two) times daily. Hold Eliquis till 12/16/22 and if no further bleeding, resume on 12/17/22 12/17/22 01/16/23  Delfino Lovett, MD  aspirin EC 81 MG tablet Take 1 tablet (81 mg total) by mouth at bedtime. Hold today. Resume on 12/15/22 if no further bleeding 12/15/22   Delfino Lovett, MD  atorvastatin (LIPITOR) 40 MG tablet Take 40 mg by mouth every morning.     [provider]  cetirizine (ZYRTEC) 10 MG tablet Take 10 mg by mouth daily after lunch.     [provider]  clonazePAM (KLONOPIN) 0.5 MG tablet Take 0.5 mg by mouth at bedtime as needed.    [provider]  clotrimazole (LOTRIMIN) 1 % cream Apply to affected area 2 times daily Patient taking differently: 1 Application 2 (two) times daily as needed. Apply to affected area 2 times daily 09/13/22   Eusebio Friendly B, PA-C  desvenlafaxine (PRISTIQ) 50 MG 24 hr tablet Take 50 mg by mouth daily. 06/15/21   [provider]  dicyclomine (BENTYL) 10 MG capsule Take 10 mg by mouth 2 (two) times daily.    [provider]  folic acid (FOLVITE) 1 MG tablet Take 1 mg by mouth daily.    [provider]  hydroxychloroquine (PLAQUENIL) 200 MG tablet Take 200 mg by mouth daily.    [provider]  levothyroxine (SYNTHROID) 100 MCG tablet Take 100 mcg by mouth daily before breakfast. 08/05/20   [provider]  methocarbamol (ROBAXIN) 500 MG tablet Take 500 mg by mouth at bedtime as needed for muscle spasms.    [provider]  methotrexate (RHEUMATREX) 2.5 MG tablet Take 17.5 mg by mouth every Wednesday.    [provider]   nitrofurantoin, macrocrystal-monohydrate, (MACROBID) 100 MG capsule Take 100 mg by mouth 2 (two) times daily. 12/13/22   [provider]  nystatin (MYCOSTATIN/NYSTOP) powder Apply 1 Application topically 3 (three) times daily. 12/06/22 12/06/23  [provider]  omeprazole (PRILOSEC) 20 MG capsule Take 20 mg by mouth every morning.     [provider]  QUEtiapine (SEROQUEL) 50 MG tablet Take 1.5 tablets (75 mg total) by mouth at bedtime. Dose increase 10/03/21   Jomarie Longs, MD  eszopiclone (LUNESTA) 2 MG TABS tablet Take 2 mg by mouth at bedtime as needed for sleep. Take immediately before bedtime Patient not taking: Reported on 04/14/2020  09/08/20  [provider]  phentermine 30 MG capsule Take 30 mg by mouth every morning.  09/08/20  [provider]  warfarin (COUMADIN) 2.5 MG tablet Take 2.5 mg by mouth. Patient not taking: Reported on 04/14/2020  09/08/20  [provider]      VITAL SIGNS:  Blood pressure 120/63, pulse 80, temperature 97.7 F (36.5 C), temperature source Oral, resp. rate (!) 24, height 5\' 3"  (1.6 m), weight 120 kg, SpO2 95%.  PHYSICAL EXAMINATION:  Physical Exam  GENERAL:  77 y.o.-year-old Caucasian female patient lying in the bed with no acute distress.  EYES: Pupils equal, round, reactive to light and accommodation. No scleral icterus. Extraocular muscles intact.  HEENT: Head atraumatic, normocephalic. Oropharynx and nasopharynx clear.  NECK:  Supple, no jugular venous distention. No thyroid enlargement, no tenderness.  LUNGS: Diminished left basal breath sounds with left basal crackles. CARDIOVASCULAR: Regular rate and rhythm, S1, S2 normal. No murmurs, rubs, or gallops.  ABDOMEN: Soft, nondistended, nontender. Bowel sounds present. No organomegaly or mass.  EXTREMITIES: No pedal edema, cyanosis, or clubbing.  NEUROLOGIC: Cranial nerves II through XII are intact.  She has left hemiplegia with contractures of the  left hand.  Sensation intact. Gait not checked.  PSYCHIATRIC: The patient is alert and oriented x 3.  Normal affect and good eye contact. SKIN: Erythematous candidal eruption in her lower abdomen. LABORATORY PANEL:   CBC Recent Labs  Lab 06/08/23 2100  WBC 9.1  HGB 12.4  HCT 40.3  PLT 225   ------------------------------------------------------------------------------------------------------------------  Chemistries  Recent Labs  Lab 06/08/23 2100  NA 136  K 4.3  CL 103  CO2 26  GLUCOSE 134*  BUN 8  CREATININE 0.62  CALCIUM 8.4*  AST 40  ALT 31  ALKPHOS 79  BILITOT 1.1   ------------------------------------------------------------------------------------------------------------------  Cardiac Enzymes No results for input(s): "TROPONINI" in the last 168 hours. ------------------------------------------------------------------------------------------------------------------  RADIOLOGY:  DG Chest 2 View  Result Date: 06/08/2023 CLINICAL DATA:  Cough EXAM: CHEST - 2 VIEW COMPARISON:  03/09/2023 FINDINGS: The heart size and mediastinal contours are stable. Mildly prominent left basilar interstitial markings. No pleural effusion or pneumothorax. IMPRESSION: Mildly prominent left basilar interstitial markings, which may reflect atelectasis or developing infiltrate. Electronically Signed   By: Duanne Guess D.O.   On: 06/08/2023 20:46      IMPRESSION AND PLAN:  Assessment and Plan: * Acute hypoxemic respiratory failure due to COVID-19 [facility]) - The patient will be admitted to a medical telemetry bed. - Will be placed on COVID 19 isolation protocol. - We will continue IV remdesivir per pharmacy recommendation. - IV Solu-Medrol be provided given her hypoxia. - Will be placed on vitamin C and zinc sulfate as well as aspirin. - She will be hydrated with IV normal saline.  Sepsis due to pneumonia (HCC) - Sepsis manifested by tachypnea of 22 and heart rate of 97.  She may  meet severe sepsis criteria given her hypoxia of 86% on room air. - She will be placed on IV Rocephin and Zithromax. - We will follow blood cultures. - Mucolytic therapy as well as bronchodilator therapy will be provided.  Dyslipidemia - We will continue statin therapy.  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  Type 2 diabetes mellitus with peripheral neuropathy (HCC) - The patient will be placed on supplemental coverage with NovoLog. - We will continue Neurontin.  History of DVT (deep vein thrombosis) - She also has a history of PE. - We will  continue Eliquis.  Depression - We will continue Effexor XR.  Hypothyroidism - We will Continue Synthroid.  Collagen vascular disease (HCC) - We will continue Plaquenil and folic acid.    DVT prophylaxis: Lovenox.  Advanced Care Planning:  Code Status: full code.  Family Communication:  The plan of care was discussed in details with the patient (and family). I answered all questions. The patient agreed to proceed with the above mentioned plan. Further management will depend upon hospital course. Disposition Plan: Back to previous home environment Consults called: none.  All the records are reviewed and case discussed with ED provider.  Status is: Inpatient    At the time of the admission, it appears that the appropriate admission status for this patient is inpatient.  This is judged to be reasonable and necessary in order to provide the required intensity of service to ensure the patient's safety given the presenting symptoms, physical exam findings and initial radiographic and laboratory data in the context of comorbid conditions.  The patient requires inpatient status due to high intensity of service, high risk of further deterioration and high frequency of surveillance required.  I certify that at the time of admission, it is my clinical judgment that the patient will require inpatient  hospital care extending more than 2 midnights.                            Dispo: The patient is from: Home              Anticipated d/c is to: Home              Patient currently is not medically stable to d/c.              Difficult to place patient: No  Hannah Beat M.D on 06/09/2023 at 1:34 AM  [facility]   From 7 PM-7 AM, contact night-coverage [email or web address]  CC: Primary care physician; Tawnya Crook, MD

## 2023-06-08 NOTE — ED Triage Notes (Signed)
First RN Note:   Pt to ED via [facility] from home. Per EMS pt has been sick x several days. Per EMS pt's husband is refusing to care for patient. Per EMS pt husband states he is "unable to do it anymore". Per EMS pt has had symptoms of a common cold at home.    98%  98HR 112/64

## 2023-06-08 NOTE — ED Provider Notes (Signed)
[facility] Provider Note    Event Date/Time   First MD Initiated Contact with Patient 06/08/23 2013     (approximate)   History   Cough   HPI  Ariel Alvarez is a 77 y.o. female presenting to the emergency department for evaluation of cough and congestion.  She reports that for the past 3 days she has had frequent cough and congestion.  Reports feeling generally weak with bodyaches.  Denies fevers.  No known sick contacts.  Denies chest pain.      Physical Exam   Triage Vital Signs: ED Triage Vitals  Encounter Vitals Group     BP 06/08/23 2005 135/67     Systolic BP Percentile --      Diastolic BP Percentile --      Pulse Rate 06/08/23 2005 97     Resp 06/08/23 2005 (!) 22     Temp 06/08/23 2005 100.1 F (37.8 C)     Temp Source 06/08/23 2015 Oral     SpO2 06/08/23 2005 (!) 86 %     Weight 06/08/23 2001 264 lb 8.8 oz (120 kg)     Height 06/08/23 2001 5\' 3"  (1.6 m)     Head Circumference --      Peak Flow --      Pain Score 06/08/23 2000 8     Pain Loc --      Pain Education --      Exclude from Growth Chart --     Most recent vital signs: Vitals:   06/08/23 2215 06/09/23 0000  BP: 130/61 120/63  Pulse: 84 80  Resp: 20 (!) 24  Temp:    SpO2: 98% 95%     General: Awake, interactive  CV:  Regular rate, good peripheral perfusion.  Resp:  Lungs clear, unlabored respirations.  Abd:  Soft, nondistended.  Neuro:  Symmetric facial movement, fluid speech, generalized weakness   ED Results / Procedures / Treatments   Labs (all labs ordered are listed, but only abnormal results are displayed) Labs Reviewed  SARS CORONAVIRUS 2 BY RT PCR - Abnormal; Notable for the following components:      Result Value   SARS Coronavirus 2 by RT PCR POSITIVE (*)    All other components within normal limits  COMPREHENSIVE METABOLIC PANEL - Abnormal; Notable for the following components:   Glucose, Bld 134 (*)    Calcium 8.4 (*)    Total Protein  6.3 (*)    Albumin 3.2 (*)    All other components within normal limits  CBC WITH DIFFERENTIAL/PLATELET - Abnormal; Notable for the following components:   RDW 17.5 (*)    All other components within normal limits  CULTURE, BLOOD (SINGLE)  LACTIC ACID, PLASMA  BASIC METABOLIC PANEL  CBC     EKG EKG independently reviewed interpreted by myself (ER attending) demonstrates:    RADIOLOGY Imaging independently reviewed and interpreted by myself demonstrates:  CXR without obvious focal consolidation.  Radiology notes mildly prominent left basilar infiltrates, possible reflective of atelectasis or developing infiltrate  PROCEDURES:  Critical Care performed: Yes, see critical care procedure note(s)  Procedures   MEDICATIONS ORDERED IN ED: Medications  aspirin EC tablet 81 mg (has no administration in time range)  hydroxychloroquine (PLAQUENIL) tablet 200 mg (has no administration in time range)  methotrexate (RHEUMATREX) tablet 17.5 mg (has no administration in time range)  atorvastatin (LIPITOR) tablet 40 mg (has no administration in time range)  levothyroxine (SYNTHROID) tablet  100 mcg (has no administration in time range)  QUEtiapine (SEROQUEL) tablet 50 mg (has no administration in time range)  venlafaxine XR (EFFEXOR-XR) 24 hr capsule 150 mg (has no administration in time range)  dicyclomine (BENTYL) capsule 10 mg (has no administration in time range)  pantoprazole (PROTONIX) EC tablet 40 mg (has no administration in time range)  apixaban (ELIQUIS) tablet 5 mg (has no administration in time range)  folic acid (FOLVITE) tablet 1 mg (has no administration in time range)  clonazePAM (KLONOPIN) tablet 0.5 mg (has no administration in time range)  methocarbamol (ROBAXIN) tablet 500 mg (has no administration in time range)  albuterol (VENTOLIN HFA) 108 (90 Base) MCG/ACT inhaler 2 puff (has no administration in time range)  loratadine (CLARITIN) tablet 10 mg (has no administration  in time range)  methylPREDNISolone sodium succinate (SOLU-MEDROL) 125 mg/2 mL injection 120 mg (120 mg Intravenous Given 06/09/23 0022)    Followed by  predniSONE (DELTASONE) tablet 50 mg (has no administration in time range)  zinc sulfate capsule 220 mg (has no administration in time range)  ascorbic acid (VITAMIN C) tablet 1,000 mg (has no administration in time range)  cefTRIAXone (ROCEPHIN) 2 g in sodium chloride 0.9 % 100 mL IVPB (2 g Intravenous New Bag/Given 06/09/23 0021)  azithromycin (ZITHROMAX) 500 mg in sodium chloride 0.9 % 250 mL IVPB (has no administration in time range)  0.9 %  sodium chloride infusion ( Intravenous New Bag/Given 06/09/23 0019)  acetaminophen (TYLENOL) tablet 650 mg (650 mg Oral Given 06/09/23 0021)    Or  acetaminophen (TYLENOL) suppository 650 mg ( Rectal See Alternative 06/09/23 0021)  traZODone (DESYREL) tablet 25 mg (25 mg Oral Given 06/09/23 0022)  magnesium hydroxide (MILK OF MAGNESIA) suspension 30 mL (has no administration in time range)  ondansetron (ZOFRAN) tablet 4 mg (has no administration in time range)    Or  ondansetron (ZOFRAN) injection 4 mg (has no administration in time range)  guaiFENesin (MUCINEX) 12 hr tablet 600 mg (600 mg Oral Given 06/09/23 0021)  chlorpheniramine-HYDROcodone (TUSSIONEX) 10-8 MG/5ML suspension 5 mL (has no administration in time range)  ipratropium-albuterol (DUONEB) 0.5-2.5 (3) MG/3ML nebulizer solution 3 mL (has no administration in time range)  remdesivir 100 mg in sodium chloride 0.9 % 100 mL IVPB (has no administration in time range)    Followed by  remdesivir 100 mg in sodium chloride 0.9 % 100 mL IVPB (has no administration in time range)  remdesivir 100 mg in sodium chloride 0.9 % 100 mL IVPB (has no administration in time range)  ketorolac (TORADOL) 15 MG/ML injection 15 mg (15 mg Intravenous Given 06/08/23 2104)  sodium chloride 0.9 % bolus 1,000 mL (0 mLs Intravenous Stopped 06/09/23 0026)     IMPRESSION / MDM  / ASSESSMENT AND PLAN / ED COURSE  I reviewed the triage vital signs and the nursing notes.  Differential diagnosis includes, but is not limited to, viral illness including COVID-19, pneumonia, anemia, electrolyte abnormality  Patient's presentation is most consistent with acute presentation with potential threat to life or bodily function.  77 year old female presenting with cough for several days.  Noted to be hypoxic in triage with a room air sat of 86%.  Attempted to wean her off oxygen, but did have recurrent desats down to 90%, no history of asthma or COPD, placed back on 1 L nasal cannula.  Her COVID-19 test did return positive.  Her x-Brittanee Ghazarian with possible mild infiltrate, but suspect her cough is more related to her COVID-19  infection so will defer antibiotics to admitting team.  With her mild hypoxia and weakness, will discuss with hospitalist team for admission.     FINAL CLINICAL IMPRESSION(S) / ED DIAGNOSES   Final diagnoses:  COVID-19     Rx / DC Orders   ED Discharge Orders     None        Note:  This document was prepared using Dragon voice recognition software and may include unintentional dictation errors.   Trinna Post, MD 06/09/23 [phone]

## 2023-06-08 NOTE — ED Notes (Signed)
Assisted NT with changing the pt brief

## 2023-06-08 NOTE — ED Notes (Signed)
Patient verbalized she recently was dx with a UTI and treated with abx and finished full course. Patient stating she is urinating a lot as well.

## 2023-06-09 ENCOUNTER — Encounter: Payer: Self-pay | Admitting: Family Medicine

## 2023-06-09 DIAGNOSIS — G8114 Spastic hemiplegia affecting left nondominant side: Secondary | ICD-10-CM

## 2023-06-09 DIAGNOSIS — J9601 Acute respiratory failure with hypoxia: Secondary | ICD-10-CM

## 2023-06-09 DIAGNOSIS — A419 Sepsis, unspecified organism: Secondary | ICD-10-CM

## 2023-06-09 DIAGNOSIS — E1142 Type 2 diabetes mellitus with diabetic polyneuropathy: Secondary | ICD-10-CM

## 2023-06-09 DIAGNOSIS — U071 COVID-19: Secondary | ICD-10-CM

## 2023-06-09 DIAGNOSIS — F32A Depression, unspecified: Secondary | ICD-10-CM | POA: Insufficient documentation

## 2023-06-09 DIAGNOSIS — Z86718 Personal history of other venous thrombosis and embolism: Secondary | ICD-10-CM

## 2023-06-09 LAB — BASIC METABOLIC PANEL WITH GFR
Anion gap: 7 (ref 5–15)
BUN: 8 mg/dL (ref 8–23)
CO2: 25 mmol/L (ref 22–32)
Calcium: 7.9 mg/dL — ABNORMAL LOW (ref 8.9–10.3)
Chloride: 106 mmol/L (ref 98–111)
Creatinine, Ser: 0.67 mg/dL (ref 0.44–1.00)
GFR, Estimated: >60 mL/min
Glucose, Bld: 227 mg/dL — ABNORMAL HIGH (ref 70–99)
Potassium: 4.1 mmol/L (ref 3.5–5.1)
Sodium: 138 mmol/L (ref 135–145)

## 2023-06-09 LAB — CBC
HCT: 39 % (ref 36.0–46.0)
Hemoglobin: 12.3 g/dL (ref 12.0–15.0)
MCH: 29.8 pg (ref 26.0–34.0)
MCHC: 31.5 g/dL (ref 30.0–36.0)
MCV: 94.4 fL (ref 80.0–100.0)
Platelets: 187 10*3/uL (ref 150–400)
RBC: 4.13 MIL/uL (ref 3.87–5.11)
RDW: 17.4 % — ABNORMAL HIGH (ref 11.5–15.5)
WBC: 7.6 10*3/uL (ref 4.0–10.5)
nRBC: 0 % (ref 0.0–0.2)

## 2023-06-09 LAB — GLUCOSE, CAPILLARY
Glucose-Capillary: 131 mg/dL — ABNORMAL HIGH (ref 70–99)
Glucose-Capillary: 241 mg/dL — ABNORMAL HIGH (ref 70–99)
Glucose-Capillary: 206 mg/dL — ABNORMAL HIGH (ref 70–99)
Glucose-Capillary: 176 mg/dL — ABNORMAL HIGH (ref 70–99)

## 2023-06-09 MED ORDER — INSULIN ASPART 100 UNIT/ML IJ SOLN: 0.0000 [IU] | Freq: Every day | INTRAMUSCULAR | Status: DC

## 2023-06-09 MED ORDER — SEMAGLUTIDE (2 MG/DOSE) 8 MG/3ML ~~LOC~~ SOPN
2.0000 mg | PEN_INJECTOR | SUBCUTANEOUS | Status: DC
  Administered 2023-06-09: 2 mg via SUBCUTANEOUS

## 2023-06-09 MED ORDER — INSULIN ASPART 100 UNIT/ML IJ SOLN
0.0000 [IU] | Freq: Three times a day (TID) | INTRAMUSCULAR | Status: DC
  Administered 2023-06-09 (×2): 3 [IU] via SUBCUTANEOUS
  Administered 2023-06-09 – 2023-06-10 (×2): 1 [IU] via SUBCUTANEOUS
  Administered 2023-06-10: 2 [IU] via SUBCUTANEOUS
  Filled 2023-06-09 (×5): qty 1

## 2023-06-09 MED ORDER — GABAPENTIN 300 MG PO CAPS
300.0000 mg | ORAL_CAPSULE | Freq: Three times a day (TID) | ORAL | Status: DC
  Administered 2023-06-09 – 2023-06-10 (×3): 300 mg via ORAL
  Filled 2023-06-09 (×3): qty 1

## 2023-06-09 MED ORDER — IPRATROPIUM-ALBUTEROL 20-100 MCG/ACT IN AERS
1.0000 | INHALATION_SPRAY | Freq: Four times a day (QID) | RESPIRATORY_TRACT | Status: DC
  Administered 2023-06-09 – 2023-06-10 (×6): 1 via RESPIRATORY_TRACT
  Filled 2023-06-09 (×2): qty 4

## 2023-06-09 NOTE — Progress Notes (Signed)
PROGRESS NOTE    Ariel Alvarez   ZOX:[id number] DOB: 11/11/1945  DOA: 06/08/2023 Date of Service: 06/09/23 PCP: Tawnya Crook, MD     Brief Narrative / Hospital Course:  Ariel Alvarez is a 77 y.o. Caucasian female with medical history significant for anxiety, osteoarthritis, depression, GERD, Hx DVT on Eliquis, hypothyroidism and CVA with left-sided hemiplegia, who presented to the emergency room with acute onset of worsening dyspnea with associated cough productive of clear sputum as well as wheezing over the last 3 days.  08/10: SpO2 86% RA, improved on [place] O2.  CXR (+)mildly prominent left basal interstitial markings that can reflect atelectasis or developing infiltrate. COVID(+). Admitted to hospitalist service for acute resp fail d/t COVID 08/11: pt reports feeling some better, would like to get up and move around a bit w/ therapy if possible. 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 Mucolytic therapy  bronchodilator therapy  IV normal saline. Follow BCx  Dyslipidemia statin  Hypothyroidism Synthroid.  Collagen vascular disease (HCC) Plaquenil, folic acid.  Depression/Anxiety Effexor XR. Klonopin  Trazodone prn insomnia   Type 2 diabetes mellitus with peripheral neuropathy  (HCC) supplemental coverage with NovoLog. Neurontin.  History of DVT (deep vein thrombosis) history of PE. Eliquis.  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) baclofen.    DVT prophylaxis: ELiquis Pertinent IV fluids/nutrition: NS 140mL/h, carb diet  Central lines / invasive devices: none  Code Status: FULL CODE ACP documentation reviewed: 06/09/23 none on file   Current Admission Status: inpatient   TOC needs / Dispo plan: home when stable, PT/OT to eval but she has DME w/ walker and wheelchair, may benefit from [facility]  Barriers to discharge / significant pending items: clinical improvement, will need to be off O2 or may need to arrange home O2, await BCx given sepsis, possible d/c Mon 08/21 or Tues 08/13             Subjective / Brief ROS:  Patient reports doing better this morning compared to yesterday Denies CP Some SOB/cough Pain controlled.  Denies new weakness.  Tolerating diet.  Reports no concerns w/ urination/defecation.  Would like to get OOB if possible w/ therapy today   Family Communication: declined call today     Objective Findings:  Vitals:   06/08/23 2215 06/09/23 0000 06/09/23 0242 06/09/23 0752  BP: 130/61 120/63 112/61 (!) 120/55  Pulse: 84 80 72 73  Resp: 20 (!) 24 20 20   Temp:  97.7 F (36.5 C) 97.7 F (36.5 C) 97.8 F (36.6 C)  TempSrc:  Oral    SpO2: 98% 95% 98% 100%  Weight:      Height:        Intake/Output Summary (Last 24 hours) at 06/09/2023 1314 Last data filed at 06/09/2023 0431 Gross  per 24 hour  Intake --  Output 150 ml  Net -150 ml   Filed Weights   06/08/23 2001  Weight: 120 kg    Examination:  Physical Exam Constitutional:      General: She is not in acute distress.    Appearance: She is obese. She is not ill-appearing.  Cardiovascular:     Rate and Rhythm: Normal rate and regular rhythm.     Heart sounds: Normal heart sounds.  Pulmonary:     Effort: Pulmonary effort is normal.      Breath sounds: Normal breath sounds.  Abdominal:     General: Abdomen is flat.     Palpations: Abdomen is soft.  Musculoskeletal:     Right lower leg: No edema.     Left lower leg: Edema present.  Skin:    General: Skin is warm and dry.  Neurological:     Mental Status: She is alert and oriented to person, place, and time.     Motor: Weakness (L arm chronic weakness d/t old CVA) present.  Psychiatric:        Mood and Affect: Mood normal.        Behavior: Behavior normal.          Scheduled Medications:   apixaban  5 mg Oral BID   vitamin C  1,000 mg Oral Daily   aspirin EC  81 mg Oral Daily   atorvastatin  40 mg Oral Daily   folic acid  1 mg Oral Daily   guaiFENesin  600 mg Oral BID   [START ON 06/13/2023] hydroxychloroquine  200 mg Oral BID   insulin aspart  0-5 Units Subcutaneous QHS   insulin aspart  0-9 Units Subcutaneous TID WC   Ipratropium-Albuterol  1 puff Inhalation Q6H   levothyroxine  100 mcg Oral Q0600   loratadine  10 mg Oral Daily   [START ON 06/12/2023] methotrexate  17.5 mg Oral Q Wed   methylPREDNISolone (SOLU-MEDROL) injection  1 mg/kg Intravenous Q12H   Followed by   Melene Muller ON 06/12/2023] predniSONE  50 mg Oral Q breakfast   pantoprazole  40 mg Oral Daily   QUEtiapine  50 mg Oral QHS   venlafaxine XR  150 mg Oral Q breakfast   zinc sulfate  220 mg Oral Daily    Continuous Infusions:  sodium chloride 100 mL/hr at 06/09/23 0019   azithromycin Stopped (06/09/23 0222)   cefTRIAXone (ROCEPHIN)  IV Stopped (06/09/23 0054)   [START ON 06/10/2023] remdesivir 100 mg in sodium chloride 0.9 % 100 mL IVPB      PRN Medications:  acetaminophen **OR** acetaminophen, albuterol, chlorpheniramine-HYDROcodone, clonazePAM, magnesium hydroxide, methocarbamol, ondansetron **OR** ondansetron (ZOFRAN) IV, traZODone  Antimicrobials from admission:  Anti-infectives (From admission, onward)    Start     Dose/Rate Route Frequency Ordered Stop   06/13/23 1000   hydroxychloroquine (PLAQUENIL) tablet 200 mg        200 mg Oral 2 times daily 06/08/23 2330     06/10/23 1000  remdesivir 100 mg in sodium chloride 0.9 % 100 mL IVPB        100 mg 200 mL/hr over 30 Minutes Intravenous Daily 06/08/23 2349 06/12/23 0959   06/09/23 0230  remdesivir 100 mg in sodium chloride 0.9 % 100 mL IVPB       Placed in "Followed by" Linked Group   100 mg 200 mL/hr over 30 Minutes Intravenous  Once 06/08/23 2349 06/09/23 0353   06/09/23 0200  remdesivir 100 mg  in sodium chloride 0.9 % 100 mL IVPB       Placed in "Followed by" Linked Group   100 mg 200 mL/hr over 30 Minutes Intravenous  Once 06/08/23 2349 06/09/23 0313   06/08/23 2345  cefTRIAXone (ROCEPHIN) 2 g in sodium chloride 0.9 % 100 mL IVPB        2 g 200 mL/hr over 30 Minutes Intravenous Every 24 hours 06/08/23 2332 06/13/23 2344   06/08/23 2345  azithromycin (ZITHROMAX) 500 mg in sodium chloride 0.9 % 250 mL IVPB        500 mg 250 mL/hr over 60 Minutes Intravenous Every 24 hours 06/08/23 2332 06/13/23 2344           Data Reviewed:  I have personally reviewed the following...  CBC: Recent Labs  Lab 06/08/23 2100 06/09/23 0456  WBC 9.1 7.6  NEUTROABS 7.6  --   HGB 12.4 12.3  HCT 40.3 39.0  MCV 95.3 94.4  PLT 225 187   Basic Metabolic Panel: Recent Labs  Lab 06/08/23 2100 06/09/23 0456  NA 136 138  K 4.3 4.1  CL 103 106  CO2 26 25  GLUCOSE 134* 227*  BUN 8 8  CREATININE 0.62 0.67  CALCIUM 8.4* 7.9*   GFR: Estimated Creatinine Clearance: 77.3 mL/min (by C-G formula based on SCr of 0.67 mg/dL). Liver Function Tests: Recent Labs  Lab 06/08/23 2100  AST 40  ALT 31  ALKPHOS 79  BILITOT 1.1  PROT 6.3*  ALBUMIN 3.2*   No results for input(s): "LIPASE", "AMYLASE" in the last 168 hours. No results for input(s): "AMMONIA" in the last 168 hours. Coagulation Profile: No results for input(s): "INR", "PROTIME" in the last 168 hours. Cardiac Enzymes: No results for input(s):  "CKTOTAL", "CKMB", "CKMBINDEX", "TROPONINI" in the last 168 hours. BNP (last 3 results) No results for input(s): "PROBNP" in the last 8760 hours. HbA1C: No results for input(s): "HGBA1C" in the last 72 hours. CBG: Recent Labs  Lab 06/09/23 0237 06/09/23 1148  GLUCAP 131* 241*   Lipid Profile: No results for input(s): "CHOL", "HDL", "LDLCALC", "TRIG", "CHOLHDL", "LDLDIRECT" in the last 72 hours. Thyroid Function Tests: No results for input(s): "TSH", "T4TOTAL", "FREET4", "T3FREE", "THYROIDAB" in the last 72 hours. Anemia Panel: No results for input(s): "VITAMINB12", "FOLATE", "FERRITIN", "TIBC", "IRON", "RETICCTPCT" in the last 72 hours. Most Recent Urinalysis On File:     Component Value Date/Time   COLORURINE YELLOW (A) 03/10/2023 0334   APPEARANCEUR CLEAR 03/10/2023 0334   LABSPEC >1.030 (H) 03/10/2023 0334   PHURINE 5.0 03/10/2023 0334   GLUCOSEU NEGATIVE 03/10/2023 0334   HGBUR NEGATIVE 03/10/2023 0334   BILIRUBINUR NEGATIVE 03/10/2023 0334   KETONESUR NEGATIVE 03/10/2023 0334   PROTEINUR NEGATIVE 03/10/2023 0334   NITRITE NEGATIVE 03/10/2023 0334   LEUKOCYTESUR NEGATIVE 03/10/2023 0334   Sepsis Labs: @LABRCNTIP (procalcitonin:4,lacticidven:4) Microbiology: Recent Results (from the past 240 hour(s))  SARS Coronavirus 2 by RT PCR (hospital order, performed in [facility] hospital lab) *cepheid single result test* Anterior Nasal Swab     Status: Abnormal   Collection Time: 06/08/23  8:07 PM   Specimen: Anterior Nasal Swab  Result Value Ref Range Status   SARS Coronavirus 2 by RT PCR POSITIVE (A) NEGATIVE Final    Comment: (NOTE) SARS-CoV-2 target nucleic acids are DETECTED  SARS-CoV-2 RNA is generally detectable in upper respiratory specimens  during the acute phase of infection.  Positive results are indicative  of the presence of the identified virus, but do not rule out  bacterial infection or co-infection with other pathogens not detected by the test.  Clinical  correlation with patient history and  other diagnostic information is necessary to determine patient infection status.  The expected result is negative.  Fact Sheet for Patients:   RoadLapTop.co.za   Fact Sheet for Healthcare Providers:   http://kim-miller.com/    This test is not yet approved or cleared by the [place] FDA and  has been authorized for detection and/or diagnosis of SARS-CoV-2 by FDA under an Emergency Use Authorization (EUA).  This EUA will remain in effect (meaning this test can be used) for the duration of  the COVID-19 declaration under Section 564(b)(1)  of the Act, 21 U.S.C. section 360-bbb-3(b)(1), unless the authorization is terminated or revoked sooner.   Performed at [facility], [address] [place], [place] [address]   Blood culture (single)     Status: None (Preliminary result)   Collection Time: 06/08/23  9:00 PM   Specimen: BLOOD  Result Value Ref Range Status   Specimen Description BLOOD BLOOD RIGHT ARM  Final   Special Requests   Final    BOTTLES DRAWN AEROBIC AND ANAEROBIC Blood Culture adequate volume   Culture   Final    NO GROWTH < 12 HOURS Performed at [facility], [address] [place], [place] [address]    Report Status PENDING  Incomplete      Radiology Studies last 3 days: DG Chest 2 View  Result Date: 06/08/2023 CLINICAL DATA:  Cough EXAM: CHEST - 2 VIEW COMPARISON:  03/09/2023 FINDINGS: The heart size and mediastinal contours are stable. Mildly prominent left basilar interstitial markings. No pleural effusion or pneumothorax. IMPRESSION: Mildly prominent left basilar interstitial markings, which may reflect atelectasis or developing infiltrate. Electronically Signed   By: Duanne Guess D.O.   On: 06/08/2023 20:46             LOS: 1 day       Sunnie Nielsen, DO [facility] 06/09/2023, 1:14 PM    Dictation software may have been  used to generate the above note. Typos may occur and escape review in typed/dictated notes. Please contact Dr Lyn Hollingshead directly for clarity if needed.  Staff may message me via secure chat in Epic  but this may not receive an immediate response,  please page me for urgent matters!  If 7PM-7AM, please contact night coverage [email or web address]

## 2023-06-09 NOTE — Evaluation (Signed)
Physical Therapy Evaluation Patient Details Name: Ariel Alvarez MRN: [id number] DOB: [birth year] Today's Date: 06/09/2023  History of Present Illness  Ariel Alvarez is a 77 y.o. Caucasian female with medical history significant for anxiety, osteoarthritis, depression, GERD, Hx DVT on Eliquis, hypothyroidism and CVA with left-sided hemiplegia, who presented to the emergency room with acute onset of worsening dyspnea with associated cough productive of clear sputum as well as wheezing over the last 3 days.   Clinical Impression  Patient received in bed, she is asking to get up to bathroom to have BM. States she tried using bed pan but could not. She required max to total A for bed mobility due to L side paresis from old CVA. She is able to stand with mod A from bed and take a few steps to [facility]. Assist needed for peri care. She is able to stand again and pivot around towards recliner when she reported LE pain and needed to sit down. Chair pulled up behind her. Patient will continue to benefit from skilled PT to improve functional independence.          If plan is discharge home, recommend the following: A lot of help with bathing/dressing/bathroom;A lot of help with walking and/or transfers;Help with stairs or ramp for entrance;Assist for transportation;Assistance with cooking/housework   Can travel by private vehicle        Equipment Recommendations None recommended by PT  Recommendations for Other Services       Functional Status Assessment Patient has had a recent decline in their functional status and demonstrates the ability to make significant improvements in function in a reasonable and predictable amount of time.     Precautions / Restrictions Precautions Precautions: Fall Restrictions Weight Bearing Restrictions: No      Mobility  Bed Mobility Overal bed mobility: Needs Assistance Bed Mobility: Supine to Sit     Supine to sit: Max assist, HOB elevated           Transfers Overall transfer level: Needs assistance Equipment used: Hemi-walker Transfers: Sit to/from Stand Sit to Stand: Mod assist                Ambulation/Gait Ambulation/Gait assistance: Min assist Gait Distance (Feet): 3 Feet Assistive device: Hemi-walker Gait Pattern/deviations: Step-to pattern, Decreased step length - right, Decreased step length - left, Decreased stride length Gait velocity: decr     General Gait Details: patient able to take a few steps to [facility] then a few more side steps to recliner. Reports B LE pain with standing/attempting to walk, required chair to be pulled up behind her.  Stairs            Wheelchair Mobility     Tilt Bed    Modified Rankin (Stroke Patients Only)       Balance Overall balance assessment: Needs assistance Sitting-balance support: Feet supported Sitting balance-Leahy Scale: Fair     Standing balance support: Single extremity supported, During functional activity, Reliant on assistive device for balance Standing balance-Leahy Scale: Fair                               Pertinent Vitals/Pain Pain Assessment Pain Assessment: Faces Faces Pain Scale: Hurts little more Pain Location: B LEs Pain Descriptors / Indicators: Discomfort Pain Intervention(s): Monitored during session, Repositioned    Home Living Family/patient expects to be discharged to:: Private residence Living Arrangements: Spouse/significant other Available Help at Discharge: Family Type  of Home: House         Home Layout: One level Home Equipment: Other (comment);Wheelchair - manual (Hemiwalker)      Prior Function Prior Level of Function : Needs assist             Mobility Comments: walks short distances with hemiwalker, assistance from SO with all mobility ADLs Comments: Assistance provided by SO for ADLs     Extremity/Trunk Assessment   Upper Extremity Assessment Upper Extremity Assessment: Defer to OT  evaluation;LUE deficits/detail LUE Deficits / Details: L hemiplegia LUE Coordination: decreased gross motor    Lower Extremity Assessment Lower Extremity Assessment: LLE deficits/detail LLE Deficits / Details: Hemiplegia from prior CVA LLE Coordination: decreased gross motor    Cervical / Trunk Assessment Cervical / Trunk Assessment: Normal  Communication   Communication Communication: No apparent difficulties Cueing Techniques: Verbal cues  Cognition Arousal: Alert Behavior During Therapy: WFL for tasks assessed/performed Overall Cognitive Status: No family/caregiver present to determine baseline cognitive functioning                                          General Comments      Exercises     Assessment/Plan    PT Assessment Patient needs continued PT services  PT Problem List Decreased strength;Decreased activity tolerance;Decreased balance;Decreased mobility;Obesity       PT Treatment Interventions DME instruction;Gait training;Functional mobility training;Therapeutic activities;Therapeutic exercise;Patient/family education    PT Goals (Current goals can be found in the Care Plan section)  Acute Rehab PT Goals Patient Stated Goal: to get up and move around PT Goal Formulation: With patient Time For Goal Achievement: 06/23/23 Potential to Achieve Goals: Good    Frequency Min 1X/week     Co-evaluation               AM-PAC PT "6 Clicks" Mobility  Outcome Measure Help needed turning from your back to your side while in a flat bed without using bedrails?: Total Help needed moving from lying on your back to sitting on the side of a flat bed without using bedrails?: Total Help needed moving to and from a bed to a chair (including a wheelchair)?: A Lot Help needed standing up from a chair using your arms (e.g., wheelchair or bedside chair)?: A Lot Help needed to walk in hospital room?: A Lot Help needed climbing 3-5 steps with a railing? :  Total 6 Click Score: 9    End of Session Equipment Utilized During Treatment: Gait belt;Oxygen Activity Tolerance: Patient limited by fatigue Patient left: in chair;with chair alarm set;with call bell/phone within reach;with nursing/sitter in room Nurse Communication: Mobility status PT Visit Diagnosis: Unsteadiness on feet (R26.81);Other abnormalities of gait and mobility (R26.89);Muscle weakness (generalized) (M62.81);Difficulty in walking, not elsewhere classified (R26.2)    Time: 1610-9604 PT Time Calculation (min) (ACUTE ONLY): 24 min   Charges:   PT Evaluation $PT Eval Moderate Complexity: 1 Mod PT Treatments $Therapeutic Activity: 8-22 mins PT General Charges $$ ACUTE PT VISIT: 1 Visit         Carnisha Feltz, PT, GCS 06/09/23,3:51 PM

## 2023-06-09 NOTE — ED Notes (Signed)
Requested transport to the floor ? ?

## 2023-06-09 NOTE — Assessment & Plan Note (Signed)
-   We will Continue Synthroid. 

## 2023-06-09 NOTE — Assessment & Plan Note (Signed)
-   We will continue Plaquenil and folic acid.

## 2023-06-09 NOTE — Hospital Course (Addendum)
Ariel Alvarez is a 77 y.o. Caucasian female with medical history significant for anxiety, osteoarthritis, depression, GERD, Hx DVT on Eliquis, hypothyroidism and CVA with left-sided hemiplegia, who presented to the emergency room with acute onset of worsening dyspnea with associated cough productive of clear sputum as well as wheezing over the last 3 days.  08/10: SpO2 86% RA, improved on  O2.  CXR (+)mildly prominent left basal interstitial markings that can reflect atelectasis or developing infiltrate. COVID(+). Admitted to hospitalist service for acute resp fail d/t COVID 08/11: pt reports feeling some better, would like to get up and move around a bit w/ therapy if possible. Still some cough/SOB 08/12: *** O2  Consultants:  none  Procedures: none      ASSESSMENT & PLAN:   Principal Problem:   Acute hypoxemic respiratory failure due to COVID-19 [facility]) Active Problems:   Sepsis due to pneumonia (HCC)   Dyslipidemia   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)   Type 2 diabetes mellitus with peripheral neuropathy (HCC)   Collagen vascular disease (HCC)   Hypothyroidism   Depression   History of DVT (deep vein thrombosis)   Acute hypoxemic respiratory failure due to COVID-19 (HCC) / CAP Sepsis criteria met: HR >90, RR >20, (+)COVID Supplemental O2 IV remdesivir for COVID IV Rocephin and Zithromax for CAP IV Solu-Medrol --> po prednisone  vitamin C and zinc sulfate as well as aspirin. Mucolytic therapy  bronchodilator therapy  IV normal saline. Follow BCx  Dyslipidemia statin  Hypothyroidism Synthroid.  Collagen vascular disease (HCC) Plaquenil, folic acid.  Depression/Anxiety Effexor XR. Klonopin  Trazodone prn insomnia   Type 2 diabetes mellitus with peripheral neuropathy (HCC) supplemental coverage with NovoLog. Neurontin.  History of DVT (deep vein thrombosis) history of PE. Eliquis.  Spastic hemiplegia affecting left nondominant  side (HCC) baclofen.    DVT prophylaxis: ELiquis Pertinent IV fluids/nutrition: NS 181mL/h, carb diet  Central lines / invasive devices: none  Code Status: FULL CODE ACP documentation reviewed: 06/09/23 none on file   Current Admission Status: inpatient   TOC needs / Dispo plan: home when stable, PT/OT to eval but she has DME w/ walker and wheelchair, may benefit from [facility]  Barriers to discharge / significant pending items: clinical improvement, will need to be off O2 or may need to arrange home O2, await BCx given sepsis, possible d/c Mon 08/21 or Tues 08/13

## 2023-06-09 NOTE — Assessment & Plan Note (Signed)
-   The patient will be admitted to a medical telemetry bed. - Will be placed on COVID 19 isolation protocol. - We will continue IV remdesivir per pharmacy recommendation. - IV Solu-Medrol be provided given her hypoxia. - Will be placed on vitamin C and zinc sulfate as well as aspirin. - She will be hydrated with IV normal saline.

## 2023-06-09 NOTE — Assessment & Plan Note (Signed)
-   We will continue baclofen.

## 2023-06-09 NOTE — Progress Notes (Signed)
Remdesivir - Pharmacy Brief Note   O:  Pt symptomatic < 5 days SpO2: 86% now on 2 L/min   A/P:  Paxlovid Contraindicated d/t pt on Apixaban 5 mg BID Remdesivir 200 mg IVPB once followed by 100 mg IVPB daily x 2 days Will hold hydroxychloroquine for 3 days while on Remdesivir d/t D-D interaction.  Otelia Sergeant, PharmD, [facility] 06/09/2023 12:11 AM

## 2023-06-09 NOTE — Assessment & Plan Note (Signed)
-   We will continue Effexor XR. 

## 2023-06-09 NOTE — ED Notes (Signed)
Transport arrival to take the patient to the floor.

## 2023-06-09 NOTE — Assessment & Plan Note (Signed)
-   She also has a history of PE. - We will continue Eliquis.

## 2023-06-09 NOTE — Assessment & Plan Note (Signed)
supplemental coverage with NovoLog. continue Neurontin. 

## 2023-06-09 NOTE — Assessment & Plan Note (Signed)
-   We will continue statin therapy. 

## 2023-06-09 NOTE — Assessment & Plan Note (Signed)
-   Sepsis manifested by tachypnea of 22 and heart rate of 97.  She may meet severe sepsis criteria given her hypoxia of 86% on room air. - She will be placed on IV Rocephin and Zithromax. - We will follow blood cultures. - Mucolytic therapy as well as bronchodilator therapy will be provided.

## 2023-06-10 DIAGNOSIS — J9601 Acute respiratory failure with hypoxia: Secondary | ICD-10-CM | POA: Diagnosis not present

## 2023-06-10 DIAGNOSIS — U071 COVID-19: Secondary | ICD-10-CM | POA: Diagnosis not present

## 2023-06-10 LAB — GLUCOSE, CAPILLARY
Glucose-Capillary: 147 mg/dL — ABNORMAL HIGH (ref 70–99)
Glucose-Capillary: 152 mg/dL — ABNORMAL HIGH (ref 70–99)

## 2023-06-10 MED ORDER — AMOXICILLIN-POT CLAVULANATE 500-125 MG PO TABS: 1.0000 | ORAL_TABLET | Freq: Two times a day (BID) | ORAL | 0 refills | Status: AC

## 2023-06-10 MED ORDER — AZITHROMYCIN 500 MG PO TABS: 500.0000 mg | ORAL_TABLET | Freq: Every day | ORAL | 0 refills | Status: AC

## 2023-06-10 MED ORDER — MAGNESIUM HYDROXIDE 400 MG/5ML PO SUSP: 30.0000 mL | Freq: Every day | ORAL | Status: DC | PRN

## 2023-06-10 MED ORDER — ONDANSETRON HCL 4 MG/2ML IJ SOLN: 4.0000 mg | INTRAMUSCULAR | Status: DC | PRN

## 2023-06-10 MED ORDER — GUAIFENESIN ER 600 MG PO TB12: 600.0000 mg | ORAL_TABLET | Freq: Two times a day (BID) | ORAL | Status: DC

## 2023-06-10 NOTE — Progress Notes (Signed)
Physical Therapy Treatment Patient Details Name: Ariel Alvarez MRN: [id number] DOB: 1946-07-08 Today's Date: 06/10/2023   History of Present Illness 77 y.o. female with medical history significant for anxiety, osteoarthritis, depression, GERD, Hx DVT on Eliquis, hypothyroidism and CVA with left-sided hemiplegia, who presented to the emergency room with acute onset of worsening dyspnea with associated cough productive of clear sputum as well as wheezing over the last 3 days.    PT Comments  Patient supine in bed on arrival and agreeable to PT/OT treatment session with encouragement. Required maxA for bed mobility with use of bed rails and HOB very elevated. Patient able to stand from elevated surface with modA+2 and take steps towards recliner with hemiwalker and minA with +2 for safety. Tangential throughout session about topics not associated with conversation. Requires cues for redirection onto task. Left in recliner with chair alarm set and call bell within reach. Discharge plan remains appropriate at this time as long as husband is able to provide necessary assistance.     If plan is discharge home, recommend the following: A lot of help with bathing/dressing/bathroom;A lot of help with walking and/or transfers;Help with stairs or ramp for entrance;Assist for transportation;Assistance with cooking/housework   Can travel by private vehicle        Equipment Recommendations  None recommended by PT    Recommendations for Other Services       Precautions / Restrictions Precautions Precautions: Fall Precaution Comments: hx of L hemi Restrictions Weight Bearing Restrictions: No     Mobility  Bed Mobility Overal bed mobility: Needs Assistance Bed Mobility: Supine to Sit     Supine to sit: Max assist, HOB elevated, Used rails     General bed mobility comments: assist for LE management and trunk elevation    Transfers Overall transfer level: Needs assistance Equipment used:  Hemi-Jaegar Croft Transfers: Sit to/from Stand Sit to Stand: Mod assist, +2 safety/equipment, +2 physical assistance           General transfer comment: from slightly elevated bed surface, requred modA+2 to stand    Ambulation/Gait Ambulation/Gait assistance: Min assist, +2 safety/equipment Gait Distance (Feet): 5 Feet Assistive device: Hemi-Alease Fait Gait Pattern/deviations: Step-to pattern, Decreased step length - right, Decreased step length - left, Decreased stride length Gait velocity: decreased     General Gait Details: able to take a few steps to recliner with minA, hemiwalker, and +2 for safety. Cues to back up to recliner but patient sitting prematurely prior to legs touching recliner chair. Fatigues quickly   Stairs             Wheelchair Mobility     Tilt Bed    Modified Rankin (Stroke Patients Only)       Balance Overall balance assessment: Needs assistance Sitting-balance support: Feet supported Sitting balance-Leahy Scale: Fair     Standing balance support: Single extremity supported, During functional activity, Reliant on assistive device for balance Standing balance-Leahy Scale: Fair                              Cognition Arousal: Alert Behavior During Therapy: WFL for tasks assessed/performed Overall Cognitive Status: History of cognitive impairments - at baseline                                 General Comments: tangential throughout session on topics not associated with conversation. Follows commands with  increased time and redirection        Exercises      General Comments        Pertinent Vitals/Pain Pain Assessment Pain Assessment: Faces Faces Pain Scale: Hurts little more Pain Location: B LEs Pain Descriptors / Indicators: Discomfort Pain Intervention(s): Monitored during session, Repositioned    Home Living                          Prior Function            PT Goals (current goals can  now be found in the care plan section) Acute Rehab PT Goals Patient Stated Goal: to get up and move around PT Goal Formulation: With patient Time For Goal Achievement: 06/23/23 Potential to Achieve Goals: Good Progress towards PT goals: Progressing toward goals    Frequency    Min 1X/week      PT Plan      Co-evaluation PT/OT/SLP Co-Evaluation/Treatment: Yes Reason for Co-Treatment: For patient/therapist safety;To address functional/ADL transfers PT goals addressed during session: Balance;Mobility/safety with mobility        AM-PAC PT "6 Clicks" Mobility   Outcome Measure  Help needed turning from your back to your side while in a flat bed without using bedrails?: Total Help needed moving from lying on your back to sitting on the side of a flat bed without using bedrails?: Total Help needed moving to and from a bed to a chair (including a wheelchair)?: A Lot Help needed standing up from a chair using your arms (e.g., wheelchair or bedside chair)?: A Lot Help needed to walk in hospital room?: A Lot Help needed climbing 3-5 steps with a railing? : Total 6 Click Score: 9    End of Session Equipment Utilized During Treatment: Gait belt Activity Tolerance: Patient limited by fatigue Patient left: in chair;with call bell/phone within reach;with chair alarm set Nurse Communication: Mobility status PT Visit Diagnosis: Unsteadiness on feet (R26.81);Other abnormalities of gait and mobility (R26.89);Muscle weakness (generalized) (M62.81);Difficulty in walking, not elsewhere classified (R26.2)     Time: 7829-5621 PT Time Calculation (min) (ACUTE ONLY): 22 min  Charges:    $Therapeutic Activity: 8-22 mins PT General Charges $$ ACUTE PT VISIT: 1 Visit                     Maylon Peppers, PT, DPT Physical Therapist - [facility]  [facility]    Izsak Meir A Braydee Shimkus 06/10/2023, 10:45 AM

## 2023-06-10 NOTE — Evaluation (Signed)
Occupational Therapy Evaluation Patient Details Name: Ariel Alvarez MRN: [id number] DOB: June 28, 1946 Today's Date: 06/10/2023   History of Present Illness 77 y.o. female with medical history significant for anxiety, osteoarthritis, depression, GERD, Hx DVT on Eliquis, hypothyroidism and CVA with left-sided hemiplegia, who presented to the emergency room with acute onset of worsening dyspnea with associated cough productive of clear sputum as well as wheezing over the last 3 days.   Clinical Impression   Pt was seen for OT evaluation this date and cotx with PT to maximize ADL mobility efforts and safety. Prior to hospital admission, pt was requiring some assist from spouse for ADL tasks and using a hemiwalker for short distance mobility. Pt lives with her spouse who is her caregiver per pt report. Pt presents to acute OT demonstrating impaired ADL performance and functional mobility 2/2 hx of L hemi deficits, impaired balance, strength generally, and activity tolerance (See OT problem list for additional functional deficits). Pt currently requires maxA for bed mobility with use of bed rails and HOB very elevated. Patient able to stand from elevated surface with modA+2 and take steps towards recliner with hemiwalker and minA with +2 for safety. Pt requires MIN A for UB ADL tasks and MOD-MAX A for LB ADL tasks. Pt notes that her spouse is her caregiver and that he will be able to provide higher level of assist pt now requires. Pt would benefit from skilled OT services to address noted impairments and functional limitations (see below for any additional details) in order to maximize safety and independence while minimizing falls risk and caregiver burden.     If plan is discharge home, recommend the following: A lot of help with walking and/or transfers;A lot of help with bathing/dressing/bathroom;Assistance with cooking/housework;Assist for transportation;Help with stairs or ramp for entrance     Functional Status Assessment  Patient has had a recent decline in their functional status and demonstrates the ability to make significant improvements in function in a reasonable and predictable amount of time.  Equipment Recommendations  BSC/3in1    Recommendations for Other Services       Precautions / Restrictions Precautions Precautions: Fall Precaution Comments: hx of L hemi Restrictions Weight Bearing Restrictions: No      Mobility Bed Mobility Overal bed mobility: Needs Assistance Bed Mobility: Supine to Sit     Supine to sit: Max assist, HOB elevated, Used rails, +2 for physical assistance          Transfers Overall transfer level: Needs assistance Equipment used: Hemi-walker Transfers: Sit to/from Stand Sit to Stand: Mod assist, +2 safety/equipment, +2 physical assistance           General transfer comment: from slightly elevated bed surface, requred modA+2 to stand      Balance Overall balance assessment: Needs assistance Sitting-balance support: Feet supported Sitting balance-Leahy Scale: Fair     Standing balance support: Single extremity supported, During functional activity, Reliant on assistive device for balance Standing balance-Leahy Scale: Fair                             ADL either performed or assessed with clinical judgement   ADL                                         General ADL Comments: Pt currently requires MIN A for UB  ADL tasks and MOD-MAX A for LB ADL tasks. Pt notes that her spouse is her caregiver and that he will be able to provide higher level of assist pt now requires.     Vision         Perception         Praxis         Pertinent Vitals/Pain Pain Assessment Pain Assessment: Faces Faces Pain Scale: Hurts little more Pain Location: B LEs Pain Descriptors / Indicators: Discomfort Pain Intervention(s): Limited activity within patient's tolerance, Monitored during session,  Repositioned     Extremity/Trunk Assessment Upper Extremity Assessment Upper Extremity Assessment: Generalized weakness;Left hand dominant LUE Deficits / Details: hx L hemiplegia LUE Coordination: decreased gross motor;decreased fine motor   Lower Extremity Assessment Lower Extremity Assessment: Defer to PT evaluation;LLE deficits/detail LLE Deficits / Details: Hemiplegia from prior CVA LLE Coordination: decreased gross motor;decreased fine motor   Cervical / Trunk Assessment Cervical / Trunk Assessment: Normal   Communication     Cognition Arousal: Alert Behavior During Therapy: WFL for tasks assessed/performed Overall Cognitive Status: History of cognitive impairments - at baseline                                 General Comments: tangential throughout session on topics not associated with conversation. Follows commands with increased time and redirection     General Comments       Exercises Other Exercises Other Exercises: Pt educated in benefits of upright positioning and activity pacing to support ADL/mobility participation while minimizing over exertion and falls risk.   Shoulder Instructions      Home Living Family/patient expects to be discharged to:: Private residence Living Arrangements: Spouse/significant other Available Help at Discharge: Family Type of Home: House       Home Layout: One level               Home Equipment: Other (comment);Wheelchair - manual (Hemiwalker)          Prior Functioning/Environment Prior Level of Function : Needs assist             Mobility Comments: walks short distances with hemiwalker, assistance from SO with all mobility ADLs Comments: Assistance provided by SO for ADLs        OT Problem List: Decreased strength;Decreased range of motion;Decreased cognition;Cardiopulmonary status limiting activity;Decreased activity tolerance;Decreased safety awareness;Impaired balance (sitting and/or  standing);Decreased knowledge of use of DME or AE;Obesity;Impaired UE functional use      OT Treatment/Interventions: Self-care/ADL training;Therapeutic exercise;Therapeutic activities;Energy conservation;DME and/or AE instruction;Patient/family education;Balance training    OT Goals(Current goals can be found in the care plan section) Acute Rehab OT Goals Patient Stated Goal: go home and get therapy OT Goal Formulation: With patient Time For Goal Achievement: 06/24/23 Potential to Achieve Goals: Good ADL Goals Pt Will Perform Upper Body Dressing: with modified independence;sitting Pt Will Perform Lower Body Dressing: with caregiver independent in assisting;sit to/from stand Pt Will Transfer to Toilet: ambulating;bedside commode;with mod assist (LRAD) Pt Will Perform Toileting - Clothing Manipulation and hygiene: with modified independence  OT Frequency: Min 1X/week    Co-evaluation PT/OT/SLP Co-Evaluation/Treatment: Yes Reason for Co-Treatment: For patient/therapist safety;To address functional/ADL transfers PT goals addressed during session: Balance;Mobility/safety with mobility OT goals addressed during session: ADL's and self-care;Proper use of [employer] equipment and DME      AM-PAC OT "6 Clicks" Daily Activity     Outcome Measure Help from another  person eating meals?: A Little Help from another person taking care of personal grooming?: A Little Help from another person toileting, which includes using toliet, bedpan, or urinal?: A Lot Help from another person bathing (including washing, rinsing, drying)?: A Lot Help from another person to put on and taking off regular upper body clothing?: A Lot Help from another person to put on and taking off regular lower body clothing?: A Lot 6 Click Score: 14   End of Session Equipment Utilized During Treatment: Gait belt;Other (comment) (hemiwalker)  Activity Tolerance: Patient tolerated treatment well Patient left: in chair;with call  bell/phone within reach;with chair alarm set  OT Visit Diagnosis: Other abnormalities of gait and mobility (R26.89)                Time: 1610-9604 OT Time Calculation (min): 22 min Charges:  OT General Charges $OT Visit: 1 Visit OT Evaluation $OT Eval Moderate Complexity: 1 Mod  Arman Filter., MPH, MS, OTR/L ascom [phone] 06/10/23, 2:51 PM

## 2023-06-10 NOTE — TOC Progression Note (Signed)
Transition of Care [facility]) - Progression Note    Patient Details  Name: Ariel Alvarez MRN: [id number] Date of Birth: 1945/12/08  Transition of Care [facility]) CM/SW Contact  Marlowe Sax, RN Phone Number: 06/10/2023, 1:55 PM  Clinical Narrative:   Spoke to the patient, she lives at home with Jama Flavors, she asked me to call him  Stated he is made at her right now and wanted me to ask him if he was picking her up from the hospital I tried the number she gave me [phone], I got a VM but his VM is full and I was not able to leave a message I let her know She stated that she will need HH, [facility] has accepted her for [facility] She stated that she will need a Wheelchair hers is about 75 years old and is broken I notified [employer] and requested it be delivered to her bedside prior to DC     Expected Discharge Plan: Home w Home Health Services Barriers to Discharge: No Barriers Identified  Expected Discharge Plan and Services   Discharge Planning Services: CM Consult   Living arrangements for the past 2 months: Single Family Home Expected Discharge Date: 06/10/23               DME Arranged: Wheelchair manual DME Agency: [employer] Date DME Agency Contacted: 06/10/23 Time DME Agency Contacted: [phone] Representative spoke with at DME Agency: jon HH Arranged: PT, OT HH Agency: [facility] Date [facility] Agency Contacted: 06/10/23 Time HH Agency Contacted: [phone] Representative spoke with at [facility] Agency: [place]   Social Determinants of Health (SDOH) Interventions SDOH Screenings   Food Insecurity: No Food Insecurity (06/09/2023)  Housing: Low Risk  (06/09/2023)  Transportation Needs: No Transportation Needs (06/09/2023)  Utilities: Not At Risk (06/09/2023)  Depression (PHQ2-9): High Risk (10/03/2021)  Financial Resource Strain: Medium Risk (10/16/2019)   Received from [facility], [facility]  Physical Activity: Inactive (10/16/2019)   Received from [facility],  [facility]  Tobacco Use: Medium Risk (06/09/2023)  Health Literacy: Low Risk  (02/04/2021)   Received from [facility], [facility]    Readmission Risk Interventions     No data to display

## 2023-06-10 NOTE — Discharge Summary (Signed)
Physician Discharge Summary   Patient: Ariel Alvarez MRN: [id number]  DOB: Sep 22, 1946   Admit:     Date of Admission: 06/08/2023 Admitted from: home   Discharge: Date of discharge: 06/10/23 Disposition: Home health Condition at discharge: good  CODE STATUS: FULL CODE     Discharge Physician: Sunnie Nielsen, DO [facility]     PCP: Tawnya Crook, MD  Recommendations for Outpatient Follow-up:  Follow up with PCP Tawnya Crook, MD in 1-2 weeks Please obtain labs/tests: consider CBC, BMP, repeat CXR  in 1-2 weeks Please follow up on the following pending results: none PCP AND OTHER OUTPATIENT PROVIDERS: SEE BELOW FOR SPECIFIC DISCHARGE INSTRUCTIONS PRINTED FOR PATIENT IN ADDITION TO GENERIC AVS PATIENT INFO    Discharge Instructions     Diet - low sodium heart healthy   Complete by: As directed    Increase activity slowly   Complete by: As directed          Discharge Diagnoses: Principal Problem:   Acute hypoxemic respiratory failure due to COVID-19 [facility]) Active Problems:   Sepsis due to pneumonia (HCC)   Dyslipidemia   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 Caucasian female with medical history significant for anxiety, osteoarthritis, depression, GERD, Hx DVT on Eliquis, hypothyroidism and CVA with left-sided hemiplegia, who presented to the emergency room with acute onset of worsening dyspnea with associated cough productive of clear sputum as well as wheezing over the last 3 days.  08/10: SpO2 86% RA, improved on [place] O2.  CXR (+)mildly prominent left basal interstitial markings that can reflect atelectasis or developing infiltrate. COVID(+). Admitted to hospitalist service for acute resp fail d/t  COVID 08/11: pt reports feeling some better, would like to get up and move around a bit w/ therapy if possible. Still some cough/SOB 08/12: doing well off supplemental O2, eager for discharge home, no SOB, mild cough  Consultants:  none  Procedures: none      ASSESSMENT & PLAN:   Acute hypoxemic respiratory failure due to COVID-19 (HCC) / CAP - resolved Sepsis criteria met on admission: HR >90, RR >20, (+)COVID Supplemental O2 --> d/c and doing well IV remdesivir for COVID --> d/c  IV Rocephin and Zithromax for CAP --> po augmentin + azithromycin on discharge IV Solu-Medrol --> po prednisone but this was held d/t hx adverse reaction, and pt breathing well no supplemental O2 vitamin C and zinc sulfate as well as aspirin. Mucolytic therapy  bronchodilator therapy  BCx NGx2d   Dyslipidemia statin  Hypothyroidism Synthroid.  Collagen vascular disease (HCC) Plaquenil, folic acid.  Depression/Anxiety Effexor XR. Klonopin  Trazodone prn insomnia   Type 2 diabetes mellitus with peripheral neuropathy (HCC) supplemental coverage with NovoLog. Neurontin.  History of DVT (deep vein thrombosis) history of PE. Eliquis.  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) baclofen.         Discharge Instructions  Allergies as of 06/10/2023       Reactions   Latex Hives   Prednisone Nausea And Vomiting   Pt states causes N/V even when taken with food.        Medication List     TAKE these medications    acetaminophen 325 MG tablet Commonly  known as: TYLENOL Take 325-650 mg by mouth every 6 (six) hours as needed for mild pain or moderate pain.   albuterol 108 (90 Base) MCG/ACT inhaler Commonly known as: VENTOLIN HFA Inhale 2 puffs into the lungs every 6 (six) hours as needed for wheezing or shortness of breath.   amoxicillin-clavulanate 500-125 MG tablet Commonly known as: AUGMENTIN Take 1 tablet by mouth 2 (two) times daily for 5 days. Start  taking on: June 11, 2023   apixaban 5 MG Tabs tablet Commonly known as: ELIQUIS Take 1 tablet (5 mg total) by mouth 2 (two) times daily. Hold Eliquis till 12/16/22 and if no further bleeding, resume on 12/17/22   aspirin EC 81 MG tablet Take 1 tablet (81 mg total) by mouth at bedtime. Hold today. Resume on 12/15/22 if no further bleeding   atorvastatin 40 MG tablet Commonly known as: LIPITOR Take 40 mg by mouth every morning.   azithromycin 500 MG tablet Commonly known as: Zithromax Take 1 tablet (500 mg total) by mouth daily for 3 days. Start taking on: June 11, 2023   cetirizine 10 MG tablet Commonly known as: ZYRTEC Take 10 mg by mouth daily after lunch.   clonazePAM 0.5 MG tablet Commonly known as: KLONOPIN Take 0.5 mg by mouth at bedtime as needed.   clotrimazole 1 % cream Commonly known as: LOTRIMIN Apply to affected area 2 times daily What changed:  how much to take when to take this reasons to take this   desvenlafaxine 50 MG 24 hr tablet Commonly known as: PRISTIQ Take 50 mg by mouth daily.   dicyclomine 10 MG capsule Commonly known as: BENTYL Take 10 mg by mouth 2 (two) times daily.   folic acid 1 MG tablet Commonly known as: FOLVITE Take 1 mg by mouth daily.   gabapentin 300 MG capsule Commonly known as: NEURONTIN Take 300 mg by mouth 3 (three) times daily.   guaiFENesin 600 MG 12 hr tablet Commonly known as: MUCINEX Take 1 tablet (600 mg total) by mouth 2 (two) times daily.   hydroxychloroquine 200 MG tablet Commonly known as: PLAQUENIL Take 200 mg by mouth daily.   ketoconazole 2 % cream Commonly known as: NIZORAL Apply 1 Application topically daily.   levothyroxine 100 MCG tablet Commonly known as: SYNTHROID Take 100 mcg by mouth daily before breakfast.   methocarbamol 500 MG tablet Commonly known as: ROBAXIN Take 500 mg by mouth at bedtime as needed for muscle spasms.   methotrexate 2.5 MG tablet Commonly known as:  RHEUMATREX Take 17.5 mg by mouth every Wednesday.   nystatin powder Commonly known as: MYCOSTATIN/NYSTOP Apply 1 Application topically 3 (three) times daily.   omeprazole 20 MG capsule Commonly known as: PRILOSEC Take 20 mg by mouth every morning.   Ozempic (2 MG/DOSE) 8 MG/3ML Sopn Generic drug: Semaglutide (2 MG/DOSE) Inject into the skin.   QUEtiapine 50 MG tablet Commonly known as: SEROQUEL Take 1.5 tablets (75 mg total) by mouth at bedtime. Dose increase          Allergies  Allergen Reactions   Latex Hives   Prednisone Nausea And Vomiting    Pt states causes N/V even when taken with food.     Subjective: pt reports feeling well today, breathing fine, didn't sleep much and is eager to go home today if possible    Discharge Exam: BP (!) 111/55 (BP Location: Right Arm)   Pulse 85   Temp 98 F (36.7 C)   Resp 14  Ht 5\' 3"  (1.6 m)   Wt 120 kg   SpO2 96%   BMI 46.86 kg/m  General: Pt is alert, awake, not in acute distress Cardiovascular: RRR, S1/S2 WNL, no rubs, no gallops Respiratory: CTA bilaterally, no wheezing, no rhonchi Abdominal: Soft, NT, ND, bowel sounds + Extremities: no edema, no cyanosis     The results of significant diagnostics from this hospitalization (including imaging, microbiology, ancillary and laboratory) are listed below for reference.     Microbiology: Recent Results (from the past 240 hour(s))  SARS Coronavirus 2 by RT PCR (hospital order, performed in [facility] hospital lab) *cepheid single result test* Anterior Nasal Swab     Status: Abnormal   Collection Time: 06/08/23  8:07 PM   Specimen: Anterior Nasal Swab  Result Value Ref Range Status   SARS Coronavirus 2 by RT PCR POSITIVE (A) NEGATIVE Final    Comment: (NOTE) SARS-CoV-2 target nucleic acids are DETECTED  SARS-CoV-2 RNA is generally detectable in upper respiratory specimens  during the acute phase of infection.  Positive results are indicative  of the presence  of the identified virus, but do not rule out bacterial infection or co-infection with other pathogens not detected by the test.  Clinical correlation with patient history and  other diagnostic information is necessary to determine patient infection status.  The expected result is negative.  Fact Sheet for Patients:   RoadLapTop.co.za   Fact Sheet for Healthcare Providers:   http://kim-miller.com/    This test is not yet approved or cleared by the [place] FDA and  has been authorized for detection and/or diagnosis of SARS-CoV-2 by FDA under an Emergency Use Authorization (EUA).  This EUA will remain in effect (meaning this test can be used) for the duration of  the COVID-19 declaration under Section 564(b)(1)  of the Act, 21 U.S.C. section 360-bbb-3(b)(1), unless the authorization is terminated or revoked sooner.   Performed at [facility], [address] [place], [place] [address]   Blood culture (single)     Status: None (Preliminary result)   Collection Time: 06/08/23  9:00 PM   Specimen: BLOOD  Result Value Ref Range Status   Specimen Description BLOOD BLOOD RIGHT ARM  Final   Special Requests   Final    BOTTLES DRAWN AEROBIC AND ANAEROBIC Blood Culture adequate volume   Culture   Final    NO GROWTH 2 DAYS Performed at [facility], [address], [place], [place] [address]    Report Status PENDING  Incomplete     Labs: BNP (last 3 results) Recent Labs    12/13/22 2330  BNP 25.9   Basic Metabolic Panel: Recent Labs  Lab 06/08/23 2100 06/09/23 0456  NA 136 138  K 4.3 4.1  CL 103 106  CO2 26 25  GLUCOSE 134* 227*  BUN 8 8  CREATININE 0.62 0.67  CALCIUM 8.4* 7.9*   Liver Function Tests: Recent Labs  Lab 06/08/23 2100  AST 40  ALT 31  ALKPHOS 79  BILITOT 1.1  PROT 6.3*  ALBUMIN 3.2*   No results for input(s): "LIPASE", "AMYLASE" in the last 168 hours. No results for input(s):  "AMMONIA" in the last 168 hours. CBC: Recent Labs  Lab 06/08/23 2100 06/09/23 0456  WBC 9.1 7.6  NEUTROABS 7.6  --   HGB 12.4 12.3  HCT 40.3 39.0  MCV 95.3 94.4  PLT 225 187   Cardiac Enzymes: No results for input(s): "CKTOTAL", "CKMB", "CKMBINDEX", "TROPONINI" in the  last 168 hours. BNP: Invalid input(s): "POCBNP" CBG: Recent Labs  Lab 06/09/23 1148 06/09/23 1744 06/09/23 2110 06/10/23 0800 06/10/23 1202  GLUCAP 241* 206* 176* 147* 152*   D-Dimer No results for input(s): "DDIMER" in the last 72 hours. Hgb A1c No results for input(s): "HGBA1C" in the last 72 hours. Lipid Profile No results for input(s): "CHOL", "HDL", "LDLCALC", "TRIG", "CHOLHDL", "LDLDIRECT" in the last 72 hours. Thyroid function studies No results for input(s): "TSH", "T4TOTAL", "T3FREE", "THYROIDAB" in the last 72 hours.  Invalid input(s): "FREET3" Anemia work up No results for input(s): "VITAMINB12", "FOLATE", "FERRITIN", "TIBC", "IRON", "RETICCTPCT" in the last 72 hours. Urinalysis    Component Value Date/Time   COLORURINE YELLOW (A) 03/10/2023 0334   APPEARANCEUR CLEAR 03/10/2023 0334   LABSPEC >1.030 (H) 03/10/2023 0334   PHURINE 5.0 03/10/2023 0334   GLUCOSEU NEGATIVE 03/10/2023 0334   HGBUR NEGATIVE 03/10/2023 0334   BILIRUBINUR NEGATIVE 03/10/2023 0334   KETONESUR NEGATIVE 03/10/2023 0334   PROTEINUR NEGATIVE 03/10/2023 0334   NITRITE NEGATIVE 03/10/2023 0334   LEUKOCYTESUR NEGATIVE 03/10/2023 0334   Sepsis Labs Recent Labs  Lab 06/08/23 2100 06/09/23 0456  WBC 9.1 7.6   Microbiology Recent Results (from the past 240 hour(s))  SARS Coronavirus 2 by RT PCR (hospital order, performed in [facility] hospital lab) *cepheid single result test* Anterior Nasal Swab     Status: Abnormal   Collection Time: 06/08/23  8:07 PM   Specimen: Anterior Nasal Swab  Result Value Ref Range Status   SARS Coronavirus 2 by RT PCR POSITIVE (A) NEGATIVE Final    Comment: (NOTE) SARS-CoV-2 target  nucleic acids are DETECTED  SARS-CoV-2 RNA is generally detectable in upper respiratory specimens  during the acute phase of infection.  Positive results are indicative  of the presence of the identified virus, but do not rule out bacterial infection or co-infection with other pathogens not detected by the test.  Clinical correlation with patient history and  other diagnostic information is necessary to determine patient infection status.  The expected result is negative.  Fact Sheet for Patients:   RoadLapTop.co.za   Fact Sheet for Healthcare Providers:   http://kim-miller.com/    This test is not yet approved or cleared by the [place] FDA and  has been authorized for detection and/or diagnosis of SARS-CoV-2 by FDA under an Emergency Use Authorization (EUA).  This EUA will remain in effect (meaning this test can be used) for the duration of  the COVID-19 declaration under Section 564(b)(1)  of the Act, 21 U.S.C. section 360-bbb-3(b)(1), unless the authorization is terminated or revoked sooner.   Performed at [facility], [address] [place], [place] [address]   Blood culture (single)     Status: None (Preliminary result)   Collection Time: 06/08/23  9:00 PM   Specimen: BLOOD  Result Value Ref Range Status   Specimen Description BLOOD BLOOD RIGHT ARM  Final   Special Requests   Final    BOTTLES DRAWN AEROBIC AND ANAEROBIC Blood Culture adequate volume   Culture   Final    NO GROWTH 2 DAYS Performed at [facility], [address] [place], [place] [address]    Report Status PENDING  Incomplete   Imaging DG Chest 2 View  Result Date: 06/08/2023 CLINICAL DATA:  Cough EXAM: CHEST - 2 VIEW COMPARISON:  03/09/2023 FINDINGS: The heart size and mediastinal contours are stable. Mildly prominent left basilar interstitial markings. No pleural effusion or pneumothorax. IMPRESSION: Mildly prominent left  basilar  interstitial markings, which may reflect atelectasis or developing infiltrate. Electronically Signed   By: Duanne Guess D.O.   On: 06/08/2023 20:46      Time coordinating discharge: over 30 minutes  SIGNED:  Sunnie Nielsen DO [facility]

## 2023-06-10 NOTE — Progress Notes (Signed)
Patient suffers from stroke w/ residual hemiparesis which impairs their ability to perform daily activities like bathing, dressing, grooming, and toileting in the home.  A cane, crutch, or walker will not resolve issue with performing activities of daily living. A wheelchair will allow patient to safely perform daily activities. Patient can safely propel the wheelchair in the home or has a caregiver who can provide assistance. Length of need Lifetime. Accessories: elevating leg rests (ELRs), wheel locks, extensions and anti-tippers.

## 2023-06-10 NOTE — Progress Notes (Signed)
1405 Pt specific meds returned to pt who verifies that she has all her meds. Witnessed with another nurse Karin Golden LPN

## 2023-06-10 NOTE — Plan of Care (Signed)
  Problem: Education: Goal: Knowledge of risk factors and measures for prevention of condition will improve Outcome: Progressing   Problem: Coping: Goal: Psychosocial and spiritual needs will be supported Outcome: Progressing   Problem: Respiratory: Goal: Will maintain a patent airway Outcome: Progressing Goal: Complications related to the disease process, condition or treatment will be avoided or minimized Outcome: Progressing   Problem: Activity: Goal: Ability to tolerate increased activity will improve Outcome: Progressing   Problem: Clinical Measurements: Goal: Ability to maintain a body temperature in the normal range will improve Outcome: Progressing   Problem: Respiratory: Goal: Ability to maintain adequate ventilation will improve Outcome: Progressing Goal: Ability to maintain a clear airway will improve Outcome: Progressing   Problem: Education: Goal: Ability to describe self-care measures that may prevent or decrease complications (Diabetes Survival Skills Education) will improve Outcome: Progressing Goal: Individualized Educational Video(s) Outcome: Progressing   Problem: Coping: Goal: Ability to adjust to condition or change in health will improve Outcome: Progressing   Problem: Fluid Volume: Goal: Ability to maintain a balanced intake and output will improve Outcome: Progressing   Problem: Health Behavior/Discharge Planning: Goal: Ability to identify and utilize available resources and services will improve Outcome: Progressing Goal: Ability to manage health-related needs will improve Outcome: Progressing   Problem: Metabolic: Goal: Ability to maintain appropriate glucose levels will improve Outcome: Progressing   Problem: Nutritional: Goal: Maintenance of adequate nutrition will improve Outcome: Progressing Goal: Progress toward achieving an optimal weight will improve Outcome: Progressing   Problem: Skin Integrity: Goal: Risk for impaired skin  integrity will decrease Outcome: Progressing   Problem: Tissue Perfusion: Goal: Adequacy of tissue perfusion will improve Outcome: Progressing   Problem: Education: Goal: Knowledge of General Education information will improve Description: Including pain rating scale, medication(s)/side effects and non-pharmacologic comfort measures Outcome: Progressing   Problem: Health Behavior/Discharge Planning: Goal: Ability to manage health-related needs will improve Outcome: Progressing   Problem: Clinical Measurements: Goal: Ability to maintain clinical measurements within normal limits will improve Outcome: Progressing Goal: Will remain free from infection Outcome: Progressing Goal: Diagnostic test results will improve Outcome: Progressing Goal: Respiratory complications will improve Outcome: Progressing Goal: Cardiovascular complication will be avoided Outcome: Progressing   Problem: Activity: Goal: Risk for activity intolerance will decrease Outcome: Progressing   Problem: Nutrition: Goal: Adequate nutrition will be maintained Outcome: Progressing   Problem: Coping: Goal: Level of anxiety will decrease Outcome: Progressing   Problem: Elimination: Goal: Will not experience complications related to bowel motility Outcome: Progressing Goal: Will not experience complications related to urinary retention Outcome: Progressing   Problem: Pain Managment: Goal: General experience of comfort will improve Outcome: Progressing   Problem: Safety: Goal: Ability to remain free from injury will improve Outcome: Progressing   Problem: Skin Integrity: Goal: Risk for impaired skin integrity will decrease Outcome: Progressing

## 2023-06-10 NOTE — TOC Progression Note (Signed)
Transition of Care [facility]) - Progression Note    Patient Details  Name: Ariel Alvarez MRN: [id number] Date of Birth: 1946/03/26  Transition of Care [facility]) CM/SW Contact  Marlowe Sax, RN Phone Number: 06/10/2023, 3:05 PM  Clinical Narrative:      The patients ride got to the hospitl and the patient did not want to wait any longer for the wheelchair, I asked [employer] to deliver to her home Expected Discharge Plan: Home w Home Health Services Barriers to Discharge: No Barriers Identified  Expected Discharge Plan and Services   Discharge Planning Services: CM Consult   Living arrangements for the past 2 months: Single Family Home Expected Discharge Date: 06/10/23               DME Arranged: Wheelchair manual DME Agency: [employer] Date DME Agency Contacted: 06/10/23 Time DME Agency Contacted: [phone] Representative spoke with at DME Agency: jon HH Arranged: PT, OT HH Agency: [facility] Date HH Agency Contacted: 06/10/23 Time HH Agency Contacted: [phone] Representative spoke with at [facility] Agency: [place]   Social Determinants of Health (SDOH) Interventions SDOH Screenings   Food Insecurity: No Food Insecurity (06/09/2023)  Housing: Low Risk  (06/09/2023)  Transportation Needs: No Transportation Needs (06/09/2023)  Utilities: Not At Risk (06/09/2023)  Depression (PHQ2-9): High Risk (10/03/2021)  Financial Resource Strain: Medium Risk (10/16/2019)   [facility], [facility]  Physical Activity: Inactive (10/16/2019)   Received from [facility], [facility]  Tobacco Use: Medium Risk (06/09/2023)  Health Literacy: Low Risk  (02/04/2021)   Received from [facility], [facility]    Readmission Risk Interventions     No data to display

## 2023-06-10 NOTE — Progress Notes (Signed)
   06/10/23 1300  Spiritual Encounters  Type of Visit Initial  Care provided to: Patient  Referral source Nurse (RN/NT/LPN)  Reason for visit Advance directives  OnCall Visit No   Patient notes she did not request an advanced directive but would be interested after speaking with her ex-fiance. She will notify nurse once ready to continue with documents.  Chaplain spiritual support services remain available as the need arises.

## 2023-06-13 LAB — CULTURE, BLOOD (SINGLE)
Culture: NO GROWTH
Special Requests: ADEQUATE

## 2023-09-10 ENCOUNTER — Ambulatory Visit
Admission: EM | Admit: 2023-09-10 | Discharge: 2023-09-10 | Disposition: A | Discharge status: Home / Self Care | Payer: Medicare PPO | Attending: Emergency Medicine | Admitting: Emergency Medicine

## 2023-09-10 DIAGNOSIS — R300 Dysuria: Secondary | ICD-10-CM | POA: Diagnosis present

## 2023-09-10 DIAGNOSIS — N390 Urinary tract infection, site not specified: Secondary | ICD-10-CM | POA: Diagnosis not present

## 2023-09-10 LAB — URINALYSIS, W/ REFLEX TO CULTURE (INFECTION SUSPECTED)
Glucose, UA: 100 mg/dL — AB
Hgb urine dipstick: NEGATIVE
Nitrite: POSITIVE — AB
Protein, ur: NEGATIVE mg/dL
Specific Gravity, Urine: >1.03 — ABNORMAL HIGH (ref 1.005–1.030)
WBC, UA: >50 WBC/hpf (ref 0–5)
pH: 6.5 (ref 5.0–8.0)

## 2023-09-10 MED ORDER — CEFUROXIME AXETIL 500 MG PO TABS: 500.0000 mg | ORAL_TABLET | Freq: Two times a day (BID) | ORAL | 0 refills | Status: AC

## 2023-09-10 NOTE — ED Triage Notes (Signed)
Patient states that sx started Thursday. Abdominal pain,painful urination.

## 2023-09-10 NOTE — Discharge Instructions (Addendum)
You urine is positive for UTI. Take antibiotic as directed (Ceftin). Drink plenty of water, follow up with PCP.  Call and schedule a follow-up appointment with urologist to discuss recurrent UTIs  Go to Er for new or worsening issues or concerns(fever, nausea, vomiting, unable to keep meds down, muscle aches, etc)

## 2023-09-10 NOTE — ED Provider Notes (Signed)
[facility]    CSN: [id number] Arrival date & time: 09/10/23  1203      History   Chief Complaint Chief Complaint  Patient presents with   Urinary Frequency    HPI Ariel Alvarez is a 77 y.o. female.   77 year old female, Ariel Alvarez, presents to urgent care for evaluation of abdominal pain and painful urination that started Thursday.  Patient has urinary frequency and urgency, no fever, no nausea, no vomiting.  Previous history of UTIs.  The history is provided by the patient. No language interpreter was used.    Past Medical History:  Diagnosis Date   Anxiety    Arthritis    RIGHT HAND   Collagen vascular disease (HCC)    Complication of anesthesia    HARD TO WAKE UP AFTER  ONE SURGERY-PT STAETS SHE WAS GIVEN TOO MUCH ANESTHESIA   Depression    Dyspnea    VERY RARE WITH EXERTION   GERD (gastroesophageal reflux disease)    History of kidney stones    H/O   Hypothyroidism    Stroke (HCC) 1191,4782   X2-LEFT HAD PARALYZED     Patient Active Problem List   Diagnosis Date Noted   Recurrent UTI 09/10/2023   Dysuria 09/10/2023   Sepsis due to pneumonia (HCC) 06/09/2023   Depression 06/09/2023   Type 2 diabetes mellitus with peripheral neuropathy (HCC) 06/09/2023   History of DVT (deep vein thrombosis) 06/09/2023   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) 06/09/2023   Acute hypoxemic respiratory failure due to COVID-19 (HCC) 06/08/2023   Hypoxia 12/14/2022   COPD with acute bronchitis (HCC) 12/14/2022   History of pulmonary embolism 12/14/2022   Chronic anticoagulation 12/14/2022   Bleeding gums 12/14/2022   Rheumatoid arthritis (HCC) 12/06/2022   Type 2 diabetes mellitus without complication, without long-term current use of insulin (HCC) 05/21/2022   CAP (community acquired pneumonia) 01/31/2022   Diabetes (HCC) 01/31/2022   Acute respiratory failure with hypoxia (HCC) 01/31/2022   Weakness of both lower extremities  01/31/2022   Lactic acidosis 01/31/2022   Moderate episode of recurrent major depressive disorder (HCC) 10/03/2021   Cognitive disorder 10/03/2021   Episodic mood disorder (HCC) 10/03/2021   Psychosis (HCC) 10/03/2021   Gait disturbance 12/19/2020   Rash 07/25/2020   Increased frequency of urination 07/25/2020   Closed nondisplaced fracture of proximal phalanx of left little finger with routine healing 04/07/2020   Hyperlipidemia 12/08/2019   Stroke (HCC) 12/08/2019   Ulcerated, foot, unspecified laterality, limited to breakdown of skin (HCC) 12/08/2019   Other constipation 09/28/2019   Dysphagia 09/28/2019   Pelvic pain in female 05/18/2019   Age-related osteoporosis without current pathological fracture 08/08/2018   Urinary hesitancy 10/04/2017   Routine health maintenance 08/26/2017   Candidiasis 08/11/2017   Morbid obesity with BMI of 40.0-44.9, adult (HCC) 07/30/2017   Collagen vascular disease (HCC) 05/21/2017   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) 05/21/2017   Chronic pain 05/21/2017   Cerebrovascular 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) 05/06/2017   Preoperative clearance 05/06/2017   DVT (deep venous thrombosis) (HCC) 03/24/2017   Contracture of joint of left hand 02/17/2017   Other pulmonary embolism without acute cor pulmonale (HCC) 02/06/2017   Right pontine stroke (HCC) 02/03/2017   Vasculitis (HCC) 01/29/2017   Abnormal ANCA test 12/30/2016   Right carpal tunnel syndrome 04/06/2016   GERD (gastroesophageal reflux disease) 12/31/2014   Dyslipidemia 04/06/2014   Calculus of kidney  and ureter 05/06/2012   Primary insomnia 07/06/2011   Hypothyroidism 07/06/2011   Depressive disorder 07/06/2011   Spastic hemiplegia affecting nondominant side (HCC) 03/27/2011    Past Surgical History:  Procedure Laterality Date   CHOLECYSTECTOMY     KIDNEY STONE SURGERY     METATARSAL HEAD EXCISION Bilateral 04/15/2020   Procedure: METATARSAL  HEAD PARTIAL EXCISION - BILATERAL;  Surgeon: Linus Galas, DPM;  Location: [facility];  Service: Podiatry;  Laterality: Bilateral;    OB History   No obstetric history on file.      Home Medications    Prior to Admission medications   Medication Sig Start Date End Date Taking? Authorizing Provider  acetaminophen (TYLENOL) 325 MG tablet Take 325-650 mg by mouth every 6 (six) hours as needed for mild pain or moderate pain.   Yes [provider]  albuterol (VENTOLIN HFA) 108 (90 Base) MCG/ACT inhaler Inhale 2 puffs into the lungs every 6 (six) hours as needed for wheezing or shortness of breath.   Yes [provider]  aspirin EC 81 MG tablet Take 1 tablet (81 mg total) by mouth at bedtime. Hold today. Resume on 12/15/22 if no further bleeding 12/15/22  Yes Delfino Lovett, MD  atorvastatin (LIPITOR) 40 MG tablet Take 40 mg by mouth every morning.    Yes [provider]  cefUROXime (CEFTIN) 500 MG tablet Take 1 tablet (500 mg total) by mouth 2 (two) times daily with a meal for 7 days. 09/10/23 09/17/23 Yes Adalae Baysinger, Para March, NP  cetirizine (ZYRTEC) 10 MG tablet Take 10 mg by mouth daily after lunch.    Yes [provider]  clonazePAM (KLONOPIN) 0.5 MG tablet Take 0.5 mg by mouth at bedtime as needed.   Yes [provider]  clotrimazole (LOTRIMIN) 1 % cream Apply to affected area 2 times daily Patient taking differently: 1 Application 2 (two) times daily as needed. Apply to affected area 2 times daily 09/13/22  Yes Eusebio Friendly B, PA-C  desvenlafaxine (PRISTIQ) 50 MG 24 hr tablet Take 50 mg by mouth daily. 06/15/21  Yes [provider]  dicyclomine (BENTYL) 10 MG capsule Take 10 mg by mouth 2 (two) times daily.   Yes [provider]  folic acid (FOLVITE) 1 MG tablet Take 1 mg by mouth daily.   Yes [provider]  gabapentin (NEURONTIN) 300 MG capsule Take 300 mg by mouth 3 (three) times daily.   Yes [provider]   guaiFENesin (MUCINEX) 600 MG 12 hr tablet Take 1 tablet (600 mg total) by mouth 2 (two) times daily. 06/10/23  Yes Sunnie Nielsen, DO  hydroxychloroquine (PLAQUENIL) 200 MG tablet Take 200 mg by mouth daily.   Yes [provider]  ketoconazole (NIZORAL) 2 % cream Apply 1 Application topically daily. 02/25/23  Yes [provider]  levothyroxine (SYNTHROID) 100 MCG tablet Take 100 mcg by mouth daily before breakfast. 08/05/20  Yes [provider]  methotrexate (RHEUMATREX) 2.5 MG tablet Take 17.5 mg by mouth every Wednesday.   Yes [provider]  MOUNJARO 7.5 MG/0.5ML Pen SMARTSIG:7.5 Milligram(s) SUB-Q Once a Week 08/14/23  Yes [provider]  nystatin (MYCOSTATIN/NYSTOP) powder Apply 1 Application topically 3 (three) times daily. 12/06/22 12/06/23 Yes [provider]  omeprazole (PRILOSEC) 20 MG capsule Take 20 mg by mouth every morning.    Yes [provider]  phenazopyridine (PYRIDIUM) 200 MG tablet Take by mouth. 09/03/23 09/13/23 Yes [provider]  QUEtiapine (SEROQUEL) 50 MG tablet Take 1.5  tablets (75 mg total) by mouth at bedtime. Dose increase 10/03/21  Yes Eappen, Levin Bacon, MD  apixaban (ELIQUIS) 5 MG TABS tablet Take 1 tablet (5 mg total) by mouth 2 (two) times daily. Hold Eliquis till 12/16/22 and if no further bleeding, resume on 12/17/22 12/17/22 06/09/23  Delfino Lovett, MD  methocarbamol (ROBAXIN) 500 MG tablet Take 500 mg by mouth at bedtime as needed for muscle spasms.    [provider]  eszopiclone (LUNESTA) 2 MG TABS tablet Take 2 mg by mouth at bedtime as needed for sleep. Take immediately before bedtime Patient not taking: Reported on 04/14/2020  09/08/20  [provider]  phentermine 30 MG capsule Take 30 mg by mouth every morning.  09/08/20  [provider]  warfarin (COUMADIN) 2.5 MG tablet Take 2.5 mg by mouth. Patient not taking: Reported on 04/14/2020  09/08/20  [provider]    Family History Family History  Problem Relation Age of Onset   Alcohol abuse Father    Depression Father    Suicidality Father    Stroke Paternal Grandmother    Diabetes Son    Cancer Paternal Aunt     Social History Social History   Tobacco Use   Smoking status: Former    Current packs/day: 0.00    Average packs/day: 1 pack/day for 20.0 years (20.0 ttl pk-yrs)    Types: Cigarettes    Start date: 10/29/1954    Quit date: 10/29/1974    Years since quitting: 48.8   Smokeless tobacco: Never  Vaping Use   Vaping status: Never Used  Substance Use Topics   Alcohol use: Not Currently    Comment: WINE OCC   Drug use: No     Allergies   Latex and Prednisone   Review of Systems Review of Systems  Constitutional:  Negative for fever.  Gastrointestinal:  Positive for abdominal pain. Negative for diarrhea, nausea and vomiting.  Genitourinary:  Positive for dysuria, frequency and urgency.  All other systems reviewed and are negative.    Physical Exam Triage Vital Signs ED Triage Vitals  Encounter Vitals Group     BP 09/10/23 1246 138/68     Systolic BP Percentile --      Diastolic BP Percentile --      Pulse Rate 09/10/23 1246 78     Resp 09/10/23 1246 19     Temp 09/10/23 1246 98.7 F (37.1 C)     Temp Source 09/10/23 1246 Oral     SpO2 09/10/23 1246 95 %     Weight --      Height --      Head Circumference --      Peak Flow --      Pain Score 09/10/23 1244 7     Pain Loc --      Pain Education --      Exclude from Growth Chart --    No data found.  Updated Vital Signs BP 138/68 (BP Location: Right Arm)   Pulse 78   Temp 98.7 F (37.1 C) (Oral)   Resp 19   SpO2 95%   Visual Acuity Right Eye Distance:   Left Eye Distance:   Bilateral Distance:    Right Eye Near:   Left Eye Near:    Bilateral Near:     Physical Exam Vitals and nursing note reviewed.  Cardiovascular:     Rate and Rhythm: Normal rate.  Pulmonary:     Effort: Pulmonary  effort is normal.  Neurological:     General: No focal deficit present.     Mental Status: She is alert and oriented to person, place, and time.     Comments: Left-sided weakness /hand contracture /deficit due to previous stroke  Psychiatric:        Attention and Perception: Attention normal.        Mood and Affect: Mood normal.        Speech: Speech normal.        Behavior: Behavior normal.      UC Treatments / Results  Labs (all labs ordered are listed, but only abnormal results are displayed) Labs Reviewed  URINALYSIS, W/ REFLEX TO CULTURE (INFECTION SUSPECTED) - Abnormal; Notable for the following components:      Result Value   APPearance HAZY (*)    Specific Gravity, Urine >1.030 (*)    Glucose, UA 100 (*)    Bilirubin Urine SMALL (*)    Ketones, ur TRACE (*)    Nitrite POSITIVE (*)    Leukocytes,Ua TRACE (*)    Bacteria, UA MANY (*)    All other components within normal limits  URINE CULTURE    EKG   Radiology No results found.  Procedures Procedures (including critical care time)  Medications Ordered in UC Medications - No data to display  Initial Impression / Assessment and Plan / UC Course  I have reviewed the triage vital signs and the nursing notes.  Pertinent labs & imaging results that were available during my care of the patient were reviewed by me and considered in my medical decision making (see chart for details).  Clinical Course as of 09/10/23 2046  Tue Sep 10, 2023  1303 UA is positive for UTI: Trace ketones, positive nitrate, positive leukocytes, over 50 WBC, many bacteria. We will treat for UTI, discussed exam findings and plan of care with patient patient verbalized understanding to this provider [JD]    Clinical Course User Index [JD] Jahlil Ziller, Para March, NP   Discussed exam findings and plan of care with patient, strict go to ER precautions given.   Patient verbalized understanding to this provider.  Ddx: Reurrent UTI, dysuria Final  Clinical Impressions(s) / UC Diagnoses   Final diagnoses:  Recurrent UTI  Dysuria     Discharge Instructions      You urine is positive for UTI. Take antibiotic as directed (Ceftin). Drink plenty of water, follow up with PCP.  Call and schedule a follow-up appointment with urologist to discuss recurrent UTIs  Go to Er for new or worsening issues or concerns(fever, nausea, vomiting, unable to keep meds down, muscle aches, etc)    ED Prescriptions     Medication Sig Dispense Auth. Provider   cefUROXime (CEFTIN) 500 MG tablet Take 1 tablet (500 mg total) by mouth 2 (two) times daily with a meal for 7 days. 14 tablet Corbin Hott, Para March, NP      PDMP not reviewed this encounter.   Clancy Gourd, NP 09/10/23 2046

## 2023-09-13 LAB — URINE CULTURE
Culture: 60000 — AB
Special Requests: NORMAL

## 2023-09-28 ENCOUNTER — Ambulatory Visit
Admission: EM | Admit: 2023-09-28 | Discharge: 2023-09-28 | Disposition: A | Discharge status: Home / Self Care | Payer: Medicare PPO | Attending: Emergency Medicine | Admitting: Emergency Medicine

## 2023-09-28 DIAGNOSIS — S76211A Strain of adductor muscle, fascia and tendon of right thigh, initial encounter: Secondary | ICD-10-CM | POA: Insufficient documentation

## 2023-09-28 DIAGNOSIS — N390 Urinary tract infection, site not specified: Secondary | ICD-10-CM | POA: Diagnosis present

## 2023-09-28 LAB — URINALYSIS, W/ REFLEX TO CULTURE (INFECTION SUSPECTED)
Bilirubin Urine: NEGATIVE
Glucose, UA: NEGATIVE mg/dL
Hgb urine dipstick: NEGATIVE
Ketones, ur: NEGATIVE mg/dL
Nitrite: NEGATIVE
Protein, ur: NEGATIVE mg/dL
RBC / HPF: NONE SEEN RBC/hpf (ref 0–5)
Specific Gravity, Urine: 1.02 (ref 1.005–1.030)
pH: 6 (ref 5.0–8.0)

## 2023-09-28 MED ORDER — CIPROFLOXACIN HCL 500 MG PO TABS: 500.0000 mg | ORAL_TABLET | Freq: Two times a day (BID) | ORAL | 0 refills | Status: DC

## 2023-09-28 MED ORDER — PHENAZOPYRIDINE HCL 200 MG PO TABS: 200.0000 mg | ORAL_TABLET | Freq: Three times a day (TID) | ORAL | 0 refills | Status: DC

## 2023-09-28 MED ORDER — BACLOFEN 10 MG PO TABS: 10.0000 mg | ORAL_TABLET | Freq: Three times a day (TID) | ORAL | 0 refills | Status: DC

## 2023-09-28 NOTE — ED Provider Notes (Signed)
[facility]    CSN: [id number] Arrival date & time: 09/28/23  1016      History   Chief Complaint Chief Complaint  Patient presents with   Abdominal Pain    Abdominal pain and urinary frequency   Leg Pain    Right leg pain x2 months    HPI Ariel Alvarez is a 77 y.o. female.   HPI  77 year old female with past medical history significant for hyperlipidemia, CVA with residual left-sided deficits, recurrent UTIs, and morbid obesity presents for evaluation of right flank pain, dysuria, urinary urgency, urinary frequency, and urinary incontinence that has been going on for last 1 to 2 weeks.  She is also experiencing pain on the inner aspect of her right thigh for the past 2 months since completing physical therapy.  She denies any blood in her urine or fever.  No falls or heavy lifting.  Patient is wheelchair-bound.  Past Medical History:  Diagnosis Date   Anxiety    Arthritis    RIGHT HAND   Collagen vascular disease (HCC)    Complication of anesthesia    HARD TO WAKE UP AFTER  ONE SURGERY-PT STAETS SHE WAS GIVEN TOO MUCH ANESTHESIA   Depression    Dyspnea    VERY RARE WITH EXERTION   GERD (gastroesophageal reflux disease)    History of kidney stones    H/O   Hypothyroidism    Stroke (HCC) 3016,0109   X2-LEFT HAD PARALYZED     Patient Active Problem List   Diagnosis Date Noted   Recurrent UTI 09/10/2023   Dysuria 09/10/2023   Sepsis due to pneumonia (HCC) 06/09/2023   Depression 06/09/2023   Type 2 diabetes mellitus with peripheral neuropathy (HCC) 06/09/2023   History of DVT (deep vein thrombosis) 06/09/2023   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) 06/09/2023   Acute hypoxemic respiratory failure due to COVID-19 (HCC) 06/08/2023   Hypoxia 12/14/2022   COPD with acute bronchitis (HCC) 12/14/2022   History of pulmonary embolism 12/14/2022   Chronic anticoagulation 12/14/2022   Bleeding gums 12/14/2022   Rheumatoid arthritis  (HCC) 12/06/2022   Type 2 diabetes mellitus without complication, without long-term current use of insulin (HCC) 05/21/2022   CAP (community acquired pneumonia) 01/31/2022   Diabetes (HCC) 01/31/2022   Acute respiratory failure with hypoxia (HCC) 01/31/2022   Weakness of both lower extremities 01/31/2022   Lactic acidosis 01/31/2022   Moderate episode of recurrent major depressive disorder (HCC) 10/03/2021   Cognitive disorder 10/03/2021   Episodic mood disorder (HCC) 10/03/2021   Psychosis (HCC) 10/03/2021   Gait disturbance 12/19/2020   Rash 07/25/2020   Increased frequency of urination 07/25/2020   Closed nondisplaced fracture of proximal phalanx of left little finger with routine healing 04/07/2020   Hyperlipidemia 12/08/2019   Stroke (HCC) 12/08/2019   Ulcerated, foot, unspecified laterality, limited to breakdown of skin (HCC) 12/08/2019   Other constipation 09/28/2019   Dysphagia 09/28/2019   Pelvic pain in female 05/18/2019   Age-related osteoporosis without current pathological fracture 08/08/2018   Urinary hesitancy 10/04/2017   Routine health maintenance 08/26/2017   Candidiasis 08/11/2017   Morbid obesity with BMI of 40.0-44.9, adult (HCC) 07/30/2017   Collagen vascular disease (HCC) 05/21/2017   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) 05/21/2017   Chronic pain 05/21/2017   Cerebrovascular 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) 05/06/2017   Preoperative clearance 05/06/2017   DVT (deep venous thrombosis) (HCC) 03/24/2017   Contracture  of joint of left hand 02/17/2017   Other pulmonary embolism without acute cor pulmonale (HCC) 02/06/2017   Right pontine stroke (HCC) 02/03/2017   Vasculitis (HCC) 01/29/2017   Abnormal ANCA test 12/30/2016   Right carpal tunnel syndrome 04/06/2016   GERD (gastroesophageal reflux disease) 12/31/2014   Dyslipidemia 04/06/2014   Calculus of kidney and ureter 05/06/2012   Primary insomnia 07/06/2011    Hypothyroidism 07/06/2011   Depressive disorder 07/06/2011   Spastic hemiplegia affecting nondominant side (HCC) 03/27/2011    Past Surgical History:  Procedure Laterality Date   CHOLECYSTECTOMY     KIDNEY STONE SURGERY     METATARSAL HEAD EXCISION Bilateral 04/15/2020   Procedure: METATARSAL HEAD PARTIAL EXCISION - BILATERAL;  Surgeon: Linus Galas, DPM;  Location: [facility];  Service: Podiatry;  Laterality: Bilateral;    OB History   No obstetric history on file.      Home Medications    Prior to Admission medications   Medication Sig Start Date End Date Taking? Authorizing Provider  acetaminophen (TYLENOL) 325 MG tablet Take 325-650 mg by mouth every 6 (six) hours as needed for mild pain or moderate pain.   Yes [provider]  albuterol (VENTOLIN HFA) 108 (90 Base) MCG/ACT inhaler Inhale 2 puffs into the lungs every 6 (six) hours as needed for wheezing or shortness of breath.   Yes [provider]  aspirin EC 81 MG tablet Take 1 tablet (81 mg total) by mouth at bedtime. Hold today. Resume on 12/15/22 if no further bleeding 12/15/22  Yes Delfino Lovett, MD  atorvastatin (LIPITOR) 40 MG tablet Take 40 mg by mouth every morning.    Yes [provider]  baclofen (LIORESAL) 10 MG tablet Take 1 tablet (10 mg total) by mouth 3 (three) times daily. 09/28/23  Yes Becky Augusta, NP  cetirizine (ZYRTEC) 10 MG tablet Take 10 mg by mouth daily after lunch.    Yes [provider]  ciprofloxacin (CIPRO) 500 MG tablet Take 1 tablet (500 mg total) by mouth 2 (two) times daily. 09/28/23  Yes Becky Augusta, NP  clonazePAM (KLONOPIN) 0.5 MG tablet Take 0.5 mg by mouth at bedtime as needed.   Yes [provider]  clotrimazole (LOTRIMIN) 1 % cream Apply to affected area 2 times daily Patient taking differently: 1 Application 2 (two) times daily as needed. Apply to affected area 2 times daily 09/13/22  Yes Eusebio Friendly B, PA-C  desvenlafaxine (PRISTIQ) 50 MG 24 hr  tablet Take 50 mg by mouth daily. 06/15/21  Yes [provider]  dicyclomine (BENTYL) 10 MG capsule Take 10 mg by mouth 2 (two) times daily.   Yes [provider]  folic acid (FOLVITE) 1 MG tablet Take 1 mg by mouth daily.   Yes [provider]  gabapentin (NEURONTIN) 300 MG capsule Take 300 mg by mouth 3 (three) times daily.   Yes [provider]  guaiFENesin (MUCINEX) 600 MG 12 hr tablet Take 1 tablet (600 mg total) by mouth 2 (two) times daily. 06/10/23  Yes Sunnie Nielsen, DO  hydroxychloroquine (PLAQUENIL) 200 MG tablet Take 200 mg by mouth daily.   Yes [provider]  ketoconazole (NIZORAL) 2 % cream Apply 1 Application topically daily. 02/25/23  Yes [provider]  levothyroxine (SYNTHROID) 100 MCG tablet Take 100 mcg by mouth daily before breakfast. 08/05/20  Yes [provider]  methocarbamol (ROBAXIN) 500 MG tablet Take 500 mg by mouth at bedtime as needed for muscle spasms.   Yes  [provider]  methotrexate (RHEUMATREX) 2.5 MG tablet Take 17.5 mg by mouth every Wednesday.   Yes [provider]  MOUNJARO 7.5 MG/0.5ML Pen SMARTSIG:7.5 Milligram(s) SUB-Q Once a Week 08/14/23  Yes [provider]  nystatin (MYCOSTATIN/NYSTOP) powder Apply 1 Application topically 3 (three) times daily. 12/06/22 12/06/23 Yes [provider]  omeprazole (PRILOSEC) 20 MG capsule Take 20 mg by mouth every morning.    Yes [provider]  phenazopyridine (PYRIDIUM) 200 MG tablet Take 1 tablet (200 mg total) by mouth 3 (three) times daily. 09/28/23  Yes Becky Augusta, NP  QUEtiapine (SEROQUEL) 50 MG tablet Take 1.5 tablets (75 mg total) by mouth at bedtime. Dose increase 10/03/21  Yes Eappen, Levin Bacon, MD  apixaban (ELIQUIS) 5 MG TABS tablet Take 1 tablet (5 mg total) by mouth 2 (two) times daily. Hold Eliquis till 12/16/22 and if no further bleeding, resume on 12/17/22 12/17/22 06/09/23  Delfino Lovett, MD  eszopiclone  (LUNESTA) 2 MG TABS tablet Take 2 mg by mouth at bedtime as needed for sleep. Take immediately before bedtime Patient not taking: Reported on 04/14/2020  09/08/20  [provider]  phentermine 30 MG capsule Take 30 mg by mouth every morning.  09/08/20  [provider]  warfarin (COUMADIN) 2.5 MG tablet Take 2.5 mg by mouth. Patient not taking: Reported on 04/14/2020  09/08/20  [provider]    Family History Family History  Problem Relation Age of Onset   Alcohol abuse Father    Depression Father    Suicidality Father    Stroke Paternal Grandmother    Diabetes Son    Cancer Paternal Aunt     Social History Social History   Tobacco Use   Smoking status: Former    Current packs/day: 0.00    Average packs/day: 1 pack/day for 20.0 years (20.0 ttl pk-yrs)    Types: Cigarettes    Start date: 10/29/1954    Quit date: 10/29/1974    Years since quitting: 48.9   Smokeless tobacco: Never  Vaping Use   Vaping status: Never Used  Substance Use Topics   Alcohol use: Not Currently    Comment: WINE OCC   Drug use: No     Allergies   Latex and Prednisone   Review of Systems Review of Systems  Constitutional:  Negative for fever.  Genitourinary:  Positive for dysuria, frequency and urgency. Negative for hematuria.       Urinary incontinence.  Musculoskeletal:  Positive for myalgias.     Physical Exam Triage Vital Signs ED Triage Vitals  Encounter Vitals Group     BP      Systolic BP Percentile      Diastolic BP Percentile      Pulse      Resp      Temp      Temp src      SpO2      Weight      Height      Head Circumference      Peak Flow      Pain Score      Pain Loc      Pain Education      Exclude from Growth Chart    No data found.  Updated Vital Signs BP 100/75 (BP Location: Right Arm)   Pulse 75   Temp 97.8 F (36.6 C) (Oral)   Resp 16   Ht 5\' 3"  (1.6 m)   Wt 249 lb (112.9 kg)  SpO2 95%   BMI 44.11 kg/m   Visual  Acuity Right Eye Distance:   Left Eye Distance:   Bilateral Distance:    Right Eye Near:   Left Eye Near:    Bilateral Near:     Physical Exam Vitals and nursing note reviewed.  Constitutional:      Appearance: Normal appearance. She is obese. She is not ill-appearing.  Cardiovascular:     Rate and Rhythm: Normal rate and regular rhythm.     Pulses: Normal pulses.     Heart sounds: Normal heart sounds. No murmur heard.    No friction rub. No gallop.  Pulmonary:     Effort: Pulmonary effort is normal.     Breath sounds: Normal breath sounds. No wheezing, rhonchi or rales.  Abdominal:     Tenderness: There is no right CVA tenderness or left CVA tenderness.  Musculoskeletal:        General: Tenderness present. No swelling.  Skin:    General: Skin is warm and dry.     Capillary Refill: Capillary refill takes less than 2 seconds.  Neurological:     General: No focal deficit present.     Mental Status: She is alert and oriented to person, place, and time.      UC Treatments / Results  Labs (all labs ordered are listed, but only abnormal results are displayed) Labs Reviewed  URINALYSIS, W/ REFLEX TO CULTURE (INFECTION SUSPECTED) - Abnormal; Notable for the following components:      Result Value   Leukocytes,Ua TRACE (*)    Bacteria, UA MANY (*)    All other components within normal limits  URINE CULTURE    EKG   Radiology No results found.  Procedures Procedures (including critical care time)  Medications Ordered in UC Medications - No data to display  Initial Impression / Assessment and Plan / UC Course  I have reviewed the triage vital signs and the nursing notes.  Pertinent labs & imaging results that were available during my care of the patient were reviewed by me and considered in my medical decision making (see chart for details).   Patient is a nontoxic-appearing 77 year old female presenting for evaluation of multiple complaints as outlined in HPI  above.  Her first complaint has to deal with UTI symptoms which been present for last 1 to 2 weeks.  She was evaluated at this urgent care on 09/10/2023 and diagnosed with recurrent UTI and discharged home on cefuroxime 500 mg twice daily for 7 days.  Patient reports that her symptoms came back right after she finished the medication.  Her second complaint is that she has been experiencing pain in the inner aspect of her right thighs since physical therapy.  She reports that her last day physical therapy and she was on an exercise bike and reports that she rode it too long and she developed pain on the inner aspect of her right thigh.  She has not talked to her primary care provider about this and she did not talk to the physical therapist.  She reports that she has a follow-up appointment with [facility] ger[facility] in approximately a month.  Her physical exam reveals no CVA tenderness though she does have mild tenderness to the right side of her abdomen and her pannus.  She also has tenderness with palpation to the muscles of the inner thigh with some mild spasm.  I will order urinalysis to evaluate for the presence of UTI.  Urinalysis shows trace leukocyte  esterase but negative for nitrites, protein, or ketones.  She has 6-10 squamous epithelials with 11-20 WBCs, many bacteria, and WBC clumps.  Despite the skin contamination I will order a urine culture.  I will discharge patient home with a diagnosis of recurrent UTI and start her on Cipro 500 mg twice daily for 7 days for treatment of urinary tract infection along with Pyridium to 200 mg every 8 hours as needed for urinary discomfort.  I will also refer her to [facility] urology for evaluation for recurrent UTIs.  With regards to her right thigh strain I have advised her to use over-the-counter Voltaren gel 4 times a day to help with pain and inflammation and I will prescribe baclofen 10 mg that she can use every 8 hours as needed for muscle pain and tension.  Home  physical therapy exercises and moist heat application were also discussed.   Final Clinical Impressions(s) / UC Diagnoses   Final diagnoses:  Recurrent UTI  Strain of groin, right, initial encounter     Discharge Instructions      Take the Cipro twice daily for 7 days with food for treatment of urinary tract infection.  Use the Pyridium every 8 hours as needed for urinary discomfort.  This will turn your urine a bright red-orange.  Increase your oral fluid intake so that you increase your urine production and or flushing your urinary system.  Take an over-the-counter probiotic, such as Culturelle-Align-Activia, 1 hour after each dose of antibiotic to prevent diarrhea or yeast infections from forming.  We will culture urine and change the antibiotics if necessary.  For your right groin strain you can purchase over-the-counter Voltaren gel and apply 4 g to your inner thigh every 6 hours as needed for pain and inflammation.  Take the baclofen 10 mg tablets, 1 tablet every 8 hours, as needed for muscle pain to your inner thigh.  Apply moist heat to your inner thigh to help with pain and inflammation.  While the home physical therapy exercises given your discharge instructions.  Return for reevaluation, or see your primary care provider, for any new or worsening symptoms.      ED Prescriptions     Medication Sig Dispense Auth. Provider   phenazopyridine (PYRIDIUM) 200 MG tablet Take 1 tablet (200 mg total) by mouth 3 (three) times daily. 6 tablet Becky Augusta, NP   baclofen (LIORESAL) 10 MG tablet Take 1 tablet (10 mg total) by mouth 3 (three) times daily. 30 each Becky Augusta, NP   ciprofloxacin (CIPRO) 500 MG tablet Take 1 tablet (500 mg total) by mouth 2 (two) times daily. 20 tablet Becky Augusta, NP      PDMP not reviewed this encounter.   Becky Augusta, NP 09/28/23 1150

## 2023-09-28 NOTE — Discharge Instructions (Addendum)
Take the Cipro twice daily for 7 days with food for treatment of urinary tract infection.  Use the Pyridium every 8 hours as needed for urinary discomfort.  This will turn your urine a bright red-orange.  Increase your oral fluid intake so that you increase your urine production and or flushing your urinary system.  Take an over-the-counter probiotic, such as Culturelle-Align-Activia, 1 hour after each dose of antibiotic to prevent diarrhea or yeast infections from forming.  We will culture urine and change the antibiotics if necessary.  For your right groin strain you can purchase over-the-counter Voltaren gel and apply 4 g to your inner thigh every 6 hours as needed for pain and inflammation.  Take the baclofen 10 mg tablets, 1 tablet every 8 hours, as needed for muscle pain to your inner thigh.  Apply moist heat to your inner thigh to help with pain and inflammation.  While the home physical therapy exercises given your discharge instructions.  Return for reevaluation, or see your primary care provider, for any new or worsening symptoms.

## 2023-09-28 NOTE — ED Triage Notes (Signed)
Pt states that she has abdominal pain and urinary frequency. X1-2 weeks  Pt states that she was recently treated for a UTI.  Pt states that she also has some right leg pain.x 2 months

## 2023-09-29 DIAGNOSIS — M79602 Pain in left arm: Secondary | ICD-10-CM | POA: Diagnosis not present

## 2023-09-29 DIAGNOSIS — N3000 Acute cystitis without hematuria: Secondary | ICD-10-CM | POA: Diagnosis not present

## 2023-09-29 DIAGNOSIS — R109 Unspecified abdominal pain: Secondary | ICD-10-CM | POA: Diagnosis present

## 2023-09-30 ENCOUNTER — Emergency Department
Admission: EM | Admit: 2023-09-30 | Discharge: 2023-09-30 | Disposition: A | Discharge status: Home / Self Care | Payer: Medicare PPO | Attending: Emergency Medicine | Admitting: Emergency Medicine

## 2023-09-30 ENCOUNTER — Emergency Department: Payer: Medicare PPO

## 2023-09-30 ENCOUNTER — Other Ambulatory Visit: Payer: Self-pay

## 2023-09-30 DIAGNOSIS — N3000 Acute cystitis without hematuria: Secondary | ICD-10-CM

## 2023-09-30 DIAGNOSIS — M79602 Pain in left arm: Secondary | ICD-10-CM

## 2023-09-30 DIAGNOSIS — R1084 Generalized abdominal pain: Secondary | ICD-10-CM

## 2023-09-30 LAB — COMPREHENSIVE METABOLIC PANEL
ALT: 23 U/L (ref 0–44)
AST: 33 U/L (ref 15–41)
Albumin: 3.1 g/dL — ABNORMAL LOW (ref 3.5–5.0)
Alkaline Phosphatase: 77 U/L (ref 38–126)
Anion gap: 7 (ref 5–15)
BUN: 7 mg/dL — ABNORMAL LOW (ref 8–23)
CO2: 29 mmol/L (ref 22–32)
Calcium: 8.5 mg/dL — ABNORMAL LOW (ref 8.9–10.3)
Chloride: 106 mmol/L (ref 98–111)
Creatinine, Ser: 0.6 mg/dL (ref 0.44–1.00)
GFR, Estimated: >60 mL/min (ref >60)
Glucose, Bld: 169 mg/dL — ABNORMAL HIGH (ref 70–99)
Potassium: 3.8 mmol/L (ref 3.5–5.1)
Sodium: 142 mmol/L (ref 135–145)
Total Bilirubin: 1 mg/dL (ref <1.2)
Total Protein: 5.6 g/dL — ABNORMAL LOW (ref 6.5–8.1)

## 2023-09-30 LAB — CBC WITH DIFFERENTIAL/PLATELET
Abs Immature Granulocytes: 0.02 10*3/uL (ref 0.00–0.07)
Basophils Absolute: 0.1 10*3/uL (ref 0.0–0.1)
Basophils Relative: 1 %
Eosinophils Absolute: 0.5 10*3/uL (ref 0.0–0.5)
Eosinophils Relative: 9 %
HCT: 40.7 % (ref 36.0–46.0)
Hemoglobin: 12.8 g/dL (ref 12.0–15.0)
Immature Granulocytes: 0 %
Lymphocytes Relative: 25 %
Lymphs Abs: 1.4 10*3/uL (ref 0.7–4.0)
MCH: 31 pg (ref 26.0–34.0)
MCHC: 31.4 g/dL (ref 30.0–36.0)
MCV: 98.5 fL (ref 80.0–100.0)
Monocytes Absolute: 0.5 10*3/uL (ref 0.1–1.0)
Monocytes Relative: 9 %
Neutro Abs: 3.1 10*3/uL (ref 1.7–7.7)
Neutrophils Relative %: 56 %
Platelets: 195 10*3/uL (ref 150–400)
RBC: 4.13 MIL/uL (ref 3.87–5.11)
RDW: 15.9 % — ABNORMAL HIGH (ref 11.5–15.5)
WBC: 5.5 10*3/uL (ref 4.0–10.5)
nRBC: 0 % (ref 0.0–0.2)

## 2023-09-30 LAB — URINE CULTURE: Culture: 40000 — AB

## 2023-09-30 LAB — LIPASE, BLOOD: Lipase: 27 U/L (ref 11–51)

## 2023-09-30 MED ORDER — IOHEXOL 350 MG/ML SOLN
100.0000 mL | Freq: Once | INTRAVENOUS | Status: AC | PRN
  Administered 2023-09-30: 100 mL via INTRAVENOUS

## 2023-09-30 NOTE — ED Notes (Signed)
Pt stopped RN when walking by and stated she was "drowning in pee."  RN got another RN and brought patient to a private room and cleaned pt, changed brief.  Pt repositioned and brought back to hall.

## 2023-09-30 NOTE — ED Notes (Signed)
Called [facility] spoke with rep.Amy she stated the pt is first to be picked up, but it probably will not be until after shift change.

## 2023-09-30 NOTE — ED Provider Notes (Signed)
[facility] Provider Note    Event Date/Time   First MD Initiated Contact with Patient 09/30/23 0319     (approximate)   History   Arm Swelling   HPI  Ariel Alvarez is a 77 year old female presenting to the emergency department for evaluation of abdominal pain and left arm pain.  Patient has a history of CVA with residual left-sided deficits.  In triage she reported that she had new left arm swelling, but clarifies to me that she has chronic swelling of her left arm with weakness there from her prior stroke, but has had some generalized pain in the extremity over the past few days.  No recent trauma.  In addition, she reports that over the past few days she has had some generalized abdominal pain with urinary frequency.  Last bowel movement was 2 days ago.  Reports nausea without vomiting.     Physical Exam   Triage Vital Signs: ED Triage Vitals  Encounter Vitals Group     BP 09/30/23 0014 127/61     Systolic BP Percentile --      Diastolic BP Percentile --      Pulse Rate 09/30/23 0014 85     Resp 09/30/23 0014 20     Temp 09/30/23 0014 97.9 F (36.6 C)     Temp src --      SpO2 09/30/23 0014 92 %     Weight 09/30/23 0011 273 lb (123.8 kg)     Height 09/30/23 0011 5\' 3"  (1.6 m)     Head Circumference --      Peak Flow --      Pain Score 09/30/23 0011 9     Pain Loc --      Pain Education --      Exclude from Growth Chart --     Most recent vital signs: Vitals:   09/30/23 0014 09/30/23 0500  BP: 127/61 (!) 132/59  Pulse: 85 94  Resp: 20 18  Temp: 97.9 F (36.6 C)   SpO2: 92% 91%     General: Awake, interactive  CV:  Regular rate, good peripheral perfusion.  Resp:  Unlabored respirations, lungs clear to auscultation Abd:  Nondistended, soft, diffuse tenderness to palpation most notably over the lower abdomen and in the right lower quadrant Neuro:  No gross facial asymmetry, fluid speech, weakness of the left upper extremity  consistent with patient's reported baseline MSK:  Swelling of the left upper extremity compared to the contralateral side, but patient does report that this is her baseline.  No appreciable erythema or warmth.  2+ radial pulses bilaterally.  No focal area of tenderness.  Reports palpation of the area improves her pain.   ED Results / Procedures / Treatments   Labs (all labs ordered are listed, but only abnormal results are displayed) Labs Reviewed  CBC WITH DIFFERENTIAL/PLATELET - Abnormal; Notable for the following components:      Result Value   RDW 15.9 (*)    All other components within normal limits  COMPREHENSIVE METABOLIC PANEL - Abnormal; Notable for the following components:   Glucose, Bld 169 (*)    BUN 7 (*)    Calcium 8.5 (*)    Total Protein 5.6 (*)    Albumin 3.1 (*)    All other components within normal limits  LIPASE, BLOOD     EKG EKG independently reviewed interpreted by myself (ER attending) demonstrates:    RADIOLOGY Imaging independently reviewed and interpreted by  myself demonstrates:  Left upper extremity ultrasound without evidence of DVT CT abdomen pelvis without evidence of pyelonephritis or SBO  PROCEDURES:  Critical Care performed: No  Procedures   MEDICATIONS ORDERED IN ED: Medications  iohexol (OMNIPAQUE) 350 MG/ML injection 100 mL (100 mLs Intravenous Contrast Given 09/30/23 0436)     IMPRESSION / MDM / ASSESSMENT AND PLAN / ED COURSE  I reviewed the triage vital signs and the nursing notes.  Differential diagnosis includes, but is not limited to, DVT, musculoskeletal strain, no evidence of cellulitis, regarding abdominal pain consideration for UTI, pyelonephritis, appendicitis, other acute intra-abdominal process  Patient's presentation is most consistent with acute presentation with potential threat to life or bodily function.  78 year old female presenting to the emergency department for evaluation of arm pain and abdominal pain.   Regarding arm pain, history stroke with residual deficit in the area.  Reports swelling is baseline, physical exam overall reassuring.  Ultrasound without evidence of DVT.  Regarding her abdominal pain, I did review her urine from urgent care.  This demonstrated many bacteria, 11-20 white blood cells with trace leukocyte esterase.  Urine culture did result with Klebsiella, susceptibilities pending.  However, with patient's abdominal tenderness, will obtain labs and CT to further evaluate for pyelonephritis or other acute intra-abdominal process.  Labs overall reassuring.  CT without acute findings.  Do think patient is stable for discharge home.  I did review urgent care note, patient does have a prescription for Cipro.  Culture with Klebsiella, do think this is reasonable continued empiric treatment until sensitivities result.  Strict return precautions provided.  Patient discharged stable condition.     FINAL CLINICAL IMPRESSION(S) / ED DIAGNOSES   Final diagnoses:  Left arm pain  Generalized abdominal pain  Acute cystitis without hematuria     Rx / DC Orders   ED Discharge Orders     None        Note:  This document was prepared using Dragon voice recognition software and may include unintentional dictation errors.   Trinna Post, MD 09/30/23 [phone]

## 2023-09-30 NOTE — ED Notes (Signed)
First nurse note: Pt BIB EMS from home c/o left arm and leg swelling and pain.  Hx left side deficit from previous CVA.  EMS vitals 133/84 BP, 86 HR, 273 CBG.

## 2023-09-30 NOTE — ED Notes (Signed)
Pt ask for pain meds, RN notified.

## 2023-09-30 NOTE — Discharge Instructions (Signed)
You were seen in the emergency department today for evaluation of your abdominal pain and arm pain.  Fortunately your testing was overall reassuring.  Please continue to take the antibiotic that you are prescribed for your UTI.  Return to the ER for new or worsening symptoms.

## 2023-09-30 NOTE — Telephone Encounter (Signed)
Telephone encounter created for results review.

## 2023-09-30 NOTE — ED Triage Notes (Signed)
Pt to ED via EMS from home, pt reports left arm swelling and pain that began 3 days ago, denies injury to area. No redness noted. Pt states she is also being treated for a UTI, started abx 1 day ago. Pt has previous stroke 7 years ago, left side effected.
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
## 2023-10-01 ENCOUNTER — Emergency Department: Payer: Medicare PPO

## 2023-10-01 ENCOUNTER — Observation Stay
Admission: EM | Admit: 2023-10-01 | Discharge: 2023-10-05 | Disposition: A | Discharge status: Home Health | Payer: Medicare PPO | Attending: Student | Admitting: Student

## 2023-10-01 DIAGNOSIS — Z8673 Personal history of transient ischemic attack (TIA), and cerebral infarction without residual deficits: Secondary | ICD-10-CM | POA: Diagnosis not present

## 2023-10-01 DIAGNOSIS — E1140 Type 2 diabetes mellitus with diabetic neuropathy, unspecified: Secondary | ICD-10-CM | POA: Diagnosis not present

## 2023-10-01 DIAGNOSIS — Z86718 Personal history of other venous thrombosis and embolism: Secondary | ICD-10-CM | POA: Insufficient documentation

## 2023-10-01 DIAGNOSIS — K219 Gastro-esophageal reflux disease without esophagitis: Secondary | ICD-10-CM | POA: Insufficient documentation

## 2023-10-01 DIAGNOSIS — R531 Weakness: Principal | ICD-10-CM

## 2023-10-01 DIAGNOSIS — Z87891 Personal history of nicotine dependence: Secondary | ICD-10-CM | POA: Diagnosis not present

## 2023-10-01 DIAGNOSIS — N390 Urinary tract infection, site not specified: Secondary | ICD-10-CM

## 2023-10-01 DIAGNOSIS — K5900 Constipation, unspecified: Secondary | ICD-10-CM | POA: Insufficient documentation

## 2023-10-01 DIAGNOSIS — Z6841 Body Mass Index (BMI) 40.0 and over, adult: Secondary | ICD-10-CM | POA: Insufficient documentation

## 2023-10-01 DIAGNOSIS — R1084 Generalized abdominal pain: Secondary | ICD-10-CM | POA: Diagnosis not present

## 2023-10-01 DIAGNOSIS — F419 Anxiety disorder, unspecified: Secondary | ICD-10-CM | POA: Insufficient documentation

## 2023-10-01 DIAGNOSIS — E785 Hyperlipidemia, unspecified: Secondary | ICD-10-CM | POA: Diagnosis not present

## 2023-10-01 DIAGNOSIS — W19XXXA Unspecified fall, initial encounter: Principal | ICD-10-CM | POA: Insufficient documentation

## 2023-10-01 DIAGNOSIS — E039 Hypothyroidism, unspecified: Secondary | ICD-10-CM | POA: Diagnosis not present

## 2023-10-01 DIAGNOSIS — L959 Vasculitis limited to the skin, unspecified: Secondary | ICD-10-CM | POA: Insufficient documentation

## 2023-10-01 DIAGNOSIS — F32A Depression, unspecified: Secondary | ICD-10-CM | POA: Insufficient documentation

## 2023-10-01 DIAGNOSIS — K5730 Diverticulosis of large intestine without perforation or abscess without bleeding: Secondary | ICD-10-CM | POA: Diagnosis not present

## 2023-10-01 DIAGNOSIS — Z8679 Personal history of other diseases of the circulatory system: Secondary | ICD-10-CM

## 2023-10-01 DIAGNOSIS — E1142 Type 2 diabetes mellitus with diabetic polyneuropathy: Secondary | ICD-10-CM | POA: Diagnosis present

## 2023-10-01 DIAGNOSIS — Z7982 Long term (current) use of aspirin: Secondary | ICD-10-CM | POA: Diagnosis not present

## 2023-10-01 DIAGNOSIS — Z86711 Personal history of pulmonary embolism: Secondary | ICD-10-CM | POA: Diagnosis present

## 2023-10-01 LAB — CBC WITH DIFFERENTIAL/PLATELET
Abs Immature Granulocytes: 0.02 10*3/uL (ref 0.00–0.07)
Basophils Absolute: 0.1 10*3/uL (ref 0.0–0.1)
Basophils Relative: 1 %
Eosinophils Absolute: 0.5 10*3/uL (ref 0.0–0.5)
Eosinophils Relative: 6 %
HCT: 39.2 % (ref 36.0–46.0)
Hemoglobin: 12.1 g/dL (ref 12.0–15.0)
Immature Granulocytes: 0 %
Lymphocytes Relative: 17 %
Lymphs Abs: 1.2 10*3/uL (ref 0.7–4.0)
MCH: 30.7 pg (ref 26.0–34.0)
MCHC: 30.9 g/dL (ref 30.0–36.0)
MCV: 99.5 fL (ref 80.0–100.0)
Monocytes Absolute: 0.8 10*3/uL (ref 0.1–1.0)
Monocytes Relative: 11 %
Neutro Abs: 4.8 10*3/uL (ref 1.7–7.7)
Neutrophils Relative %: 65 %
Platelets: 186 10*3/uL (ref 150–400)
RBC: 3.94 MIL/uL (ref 3.87–5.11)
RDW: 15.8 % — ABNORMAL HIGH (ref 11.5–15.5)
WBC: 7.4 10*3/uL (ref 4.0–10.5)
nRBC: 0 % (ref 0.0–0.2)

## 2023-10-01 LAB — BASIC METABOLIC PANEL
Anion gap: 6 (ref 5–15)
BUN: 8 mg/dL (ref 8–23)
CO2: 27 mmol/L (ref 22–32)
Calcium: 8.2 mg/dL — ABNORMAL LOW (ref 8.9–10.3)
Chloride: 107 mmol/L (ref 98–111)
Creatinine, Ser: 0.66 mg/dL (ref 0.44–1.00)
GFR, Estimated: >60 mL/min (ref >60)
Glucose, Bld: 256 mg/dL — ABNORMAL HIGH (ref 70–99)
Potassium: 3.6 mmol/L (ref 3.5–5.1)
Sodium: 140 mmol/L (ref 135–145)

## 2023-10-01 MED ORDER — MORPHINE SULFATE (PF) 4 MG/ML IV SOLN
4.0000 mg | Freq: Once | INTRAVENOUS | Status: AC
  Administered 2023-10-01: 4 mg via INTRAVENOUS
  Filled 2023-10-01: qty 1

## 2023-10-01 MED ORDER — IOHEXOL 350 MG/ML SOLN
100.0000 mL | Freq: Once | INTRAVENOUS | Status: AC | PRN
  Administered 2023-10-01: 100 mL via INTRAVENOUS

## 2023-10-01 NOTE — ED Provider Notes (Signed)
[facility] Provider Note    Event Date/Time   First MD Initiated Contact with Patient 10/01/23 2114     (approximate)   History   Fall (EMS states patient had a fall from wheelchair while reaching for phone. EMS was called but refused trip to hospital. Denies LOC or hitting head. Pt does take eliquis.  Called this evening with increasing Left arm, L leg and abdominal pain. Is currently being treated for UTI. Husband reported to EMS that pt was more agitated this evening)   HPI Ariel Alvarez is a 77 y.o. female presenting today for ground-level fall.  Patient states she fell out of her wheelchair while trying to grab her phone.  She was mostly complaining of abdominal pain initially but then reported bilateral hip pain as well as intermittent chest pain.  Also complained of cervical spine pain.  Denies pain symptoms to either upper extremities or lower extremities.  Denies obvious injury to her head but is on blood thinners.     Physical Exam   Triage Vital Signs: ED Triage Vitals  Encounter Vitals Group     BP 10/01/23 2119 137/61     Systolic BP Percentile --      Diastolic BP Percentile --      Pulse Rate 10/01/23 2117 88     Resp 10/01/23 2117 18     Temp 10/01/23 2117 98 F (36.7 C)     Temp src --      SpO2 10/01/23 2119 93 %     Weight --      Height --      Head Circumference --      Peak Flow --      Pain Score 10/01/23 2119 5     Pain Loc --      Pain Education --      Exclude from Growth Chart --     Most recent vital signs: Vitals:   10/01/23 2117 10/01/23 2119  BP:  137/61  Pulse: 88   Resp: 18   Temp: 98 F (36.7 C)   SpO2:  93%   I have reviewed the vital signs. General:  Awake, alert, no acute distress. Head:  Normocephalic, Atraumatic. EENT:  PERRL, EOMI, Oral mucosa pink and moist, Neck is supple. Cardiovascular: Regular rate, 2+ distal pulses. Respiratory:  Normal respiratory effort, symmetrical expansion, no  distress.   Abdomen: Generalized tenderness palpation throughout with no obvious bruising or ecchymosis Extremities:  Moving all four extremities through full ROM without pain.  Tender to palpation in cervical spine.  No tenderness palpation throughout bilateral upper or lower extremities Neuro:  Alert and oriented.  Interacting appropriately.   Skin:  Warm, dry, no rash.   Psych: Appropriate affect.    ED Results / Procedures / Treatments   Labs (all labs ordered are listed, but only abnormal results are displayed) Labs Reviewed  CBC WITH DIFFERENTIAL/PLATELET - Abnormal; Notable for the following components:      Result Value   RDW 15.8 (*)    All other components within normal limits  BASIC METABOLIC PANEL - Abnormal; Notable for the following components:   Glucose, Bld 256 (*)    Calcium 8.2 (*)    All other components within normal limits  URINALYSIS, ROUTINE W REFLEX MICROSCOPIC     EKG    RADIOLOGY CT is pending at time of signout   PROCEDURES:  Critical Care performed: No  Procedures   MEDICATIONS ORDERED IN  ED: Medications  morphine (PF) 4 MG/ML injection 4 mg (4 mg Intravenous Given 10/01/23 2228)  iohexol (OMNIPAQUE) 350 MG/ML injection 100 mL (100 mLs Intravenous Contrast Given 10/01/23 2258)     IMPRESSION / MDM / ASSESSMENT AND PLAN / ED COURSE  I reviewed the triage vital signs and the nursing notes.                              Differential diagnosis includes, but is not limited to, ICH, cervical spine injury, intra-abdominal traumatic injury, hip fracture, rib fractures  Patient's presentation is most consistent with acute complicated illness / injury requiring diagnostic workup.  Patient is a 77 year old female presenting today for ground-level fall with trauma to her trunk and cervical spine.  Physical exam largely unremarkable outside of few pain symptoms complaints in the neck and abdomen.  CTs ordered to rule out any obvious traumatic  pathology.  Vital signs otherwise stable.  Patient signed out pending results of CTs.  If negative, suspect she can go home his pain symptoms completely resolved after 1 dose of morphine.  The patient is on the cardiac monitor to evaluate for evidence of arrhythmia and/or significant heart rate changes.     FINAL CLINICAL IMPRESSION(S) / ED DIAGNOSES   Final diagnoses:  Fall, initial encounter  Generalized abdominal pain     Rx / DC Orders   ED Discharge Orders     None        Note:  This document was prepared using Dragon voice recognition software and may include unintentional dictation errors.   Janith Lima, MD 10/01/23 [phone]

## 2023-10-02 DIAGNOSIS — E785 Hyperlipidemia, unspecified: Secondary | ICD-10-CM | POA: Diagnosis not present

## 2023-10-02 DIAGNOSIS — R531 Weakness: Principal | ICD-10-CM

## 2023-10-02 DIAGNOSIS — Z8679 Personal history of other diseases of the circulatory system: Secondary | ICD-10-CM

## 2023-10-02 DIAGNOSIS — N390 Urinary tract infection, site not specified: Secondary | ICD-10-CM

## 2023-10-02 DIAGNOSIS — F419 Anxiety disorder, unspecified: Secondary | ICD-10-CM | POA: Insufficient documentation

## 2023-10-02 DIAGNOSIS — E039 Hypothyroidism, unspecified: Secondary | ICD-10-CM

## 2023-10-02 DIAGNOSIS — K219 Gastro-esophageal reflux disease without esophagitis: Secondary | ICD-10-CM | POA: Insufficient documentation

## 2023-10-02 DIAGNOSIS — F32A Depression, unspecified: Secondary | ICD-10-CM | POA: Insufficient documentation

## 2023-10-02 DIAGNOSIS — E1142 Type 2 diabetes mellitus with diabetic polyneuropathy: Secondary | ICD-10-CM

## 2023-10-02 LAB — BASIC METABOLIC PANEL
Anion gap: 5 (ref 5–15)
BUN: 8 mg/dL (ref 8–23)
CO2: 28 mmol/L (ref 22–32)
Calcium: 8.2 mg/dL — ABNORMAL LOW (ref 8.9–10.3)
Chloride: 106 mmol/L (ref 98–111)
Creatinine, Ser: 0.67 mg/dL (ref 0.44–1.00)
GFR, Estimated: >60 mL/min (ref >60)
Glucose, Bld: 175 mg/dL — ABNORMAL HIGH (ref 70–99)
Potassium: 3.7 mmol/L (ref 3.5–5.1)
Sodium: 139 mmol/L (ref 135–145)

## 2023-10-02 LAB — CBC
HCT: 40.2 % (ref 36.0–46.0)
Hemoglobin: 12.6 g/dL (ref 12.0–15.0)
MCH: 31.1 pg (ref 26.0–34.0)
MCHC: 31.3 g/dL (ref 30.0–36.0)
MCV: 99.3 fL (ref 80.0–100.0)
Platelets: 184 10*3/uL (ref 150–400)
RBC: 4.05 MIL/uL (ref 3.87–5.11)
RDW: 15.9 % — ABNORMAL HIGH (ref 11.5–15.5)
WBC: 8.1 10*3/uL (ref 4.0–10.5)
nRBC: 0 % (ref 0.0–0.2)

## 2023-10-02 LAB — GLUCOSE, CAPILLARY
Glucose-Capillary: 129 mg/dL — ABNORMAL HIGH (ref 70–99)
Glucose-Capillary: 128 mg/dL — ABNORMAL HIGH (ref 70–99)
Glucose-Capillary: 151 mg/dL — ABNORMAL HIGH (ref 70–99)
Glucose-Capillary: 109 mg/dL — ABNORMAL HIGH (ref 70–99)

## 2023-10-02 LAB — HEPATIC FUNCTION PANEL
ALT: 22 U/L (ref 0–44)
AST: 33 U/L (ref 15–41)
Albumin: 3.2 g/dL — ABNORMAL LOW (ref 3.5–5.0)
Alkaline Phosphatase: 74 U/L (ref 38–126)
Bilirubin, Direct: 0.3 mg/dL — ABNORMAL HIGH (ref 0.0–0.2)
Indirect Bilirubin: 0.6 mg/dL (ref 0.3–0.9)
Total Bilirubin: 0.9 mg/dL (ref <1.2)
Total Protein: 5.8 g/dL — ABNORMAL LOW (ref 6.5–8.1)

## 2023-10-02 LAB — LIPASE, BLOOD: Lipase: 87 U/L — ABNORMAL HIGH (ref 11–51)

## 2023-10-02 LAB — URINALYSIS, ROUTINE W REFLEX MICROSCOPIC
Bilirubin Urine: NEGATIVE
Glucose, UA: NEGATIVE mg/dL
Hgb urine dipstick: NEGATIVE
Ketones, ur: NEGATIVE mg/dL
Leukocytes,Ua: NEGATIVE
Nitrite: POSITIVE — AB
Protein, ur: NEGATIVE mg/dL
Specific Gravity, Urine: >1.046 — ABNORMAL HIGH (ref 1.005–1.030)
pH: 5 (ref 5.0–8.0)

## 2023-10-02 LAB — HEMOGLOBIN A1C
Hgb A1c MFr Bld: 6.2 % — ABNORMAL HIGH (ref 4.8–5.6)
Mean Plasma Glucose: 131.24 mg/dL

## 2023-10-02 LAB — TSH: TSH: 4.09 u[IU]/mL (ref 0.350–4.500)

## 2023-10-02 MED ORDER — MAGNESIUM HYDROXIDE 400 MG/5ML PO SUSP: 30.0000 mL | Freq: Every day | ORAL | Status: DC | PRN

## 2023-10-02 MED ORDER — INSULIN ASPART 100 UNIT/ML IJ SOLN
0.0000 [IU] | Freq: Three times a day (TID) | INTRAMUSCULAR | Status: DC
  Administered 2023-10-02 (×2): 2 [IU] via SUBCUTANEOUS
  Administered 2023-10-02: 3 [IU] via SUBCUTANEOUS
  Administered 2023-10-04 – 2023-10-05 (×2): 2 [IU] via SUBCUTANEOUS
  Filled 2023-10-02 (×6): qty 1

## 2023-10-02 MED ORDER — BISACODYL 10 MG RE SUPP
10.0000 mg | Freq: Once | RECTAL | Status: AC
  Administered 2023-10-03: 10 mg via RECTAL
  Filled 2023-10-02: qty 1

## 2023-10-02 MED ORDER — GABAPENTIN 300 MG PO CAPS
300.0000 mg | ORAL_CAPSULE | Freq: Three times a day (TID) | ORAL | Status: DC
  Administered 2023-10-02 – 2023-10-04 (×7): 300 mg via ORAL
  Filled 2023-10-02 (×7): qty 1

## 2023-10-02 MED ORDER — ONDANSETRON HCL 4 MG PO TABS: 4.0000 mg | ORAL_TABLET | Freq: Four times a day (QID) | ORAL | Status: DC | PRN

## 2023-10-02 MED ORDER — BISACODYL 5 MG PO TBEC
10.0000 mg | DELAYED_RELEASE_TABLET | Freq: Every day | ORAL | Status: DC
  Administered 2023-10-03 – 2023-10-05 (×3): 10 mg via ORAL
  Filled 2023-10-02 (×3): qty 2

## 2023-10-02 MED ORDER — ACETAMINOPHEN 325 MG PO TABS
650.0000 mg | ORAL_TABLET | Freq: Four times a day (QID) | ORAL | Status: DC | PRN
  Administered 2023-10-02 – 2023-10-04 (×4): 650 mg via ORAL
  Filled 2023-10-02 (×4): qty 2

## 2023-10-02 MED ORDER — PANTOPRAZOLE SODIUM 40 MG PO TBEC
40.0000 mg | DELAYED_RELEASE_TABLET | Freq: Every day | ORAL | Status: DC
  Administered 2023-10-02 – 2023-10-05 (×4): 40 mg via ORAL
  Filled 2023-10-02 (×4): qty 1

## 2023-10-02 MED ORDER — ONDANSETRON HCL 4 MG/2ML IJ SOLN: 4.0000 mg | Freq: Four times a day (QID) | INTRAMUSCULAR | Status: DC | PRN

## 2023-10-02 MED ORDER — MORPHINE SULFATE (PF) 4 MG/ML IV SOLN
4.0000 mg | Freq: Once | INTRAVENOUS | Status: AC
  Administered 2023-10-02: 4 mg via INTRAVENOUS
  Filled 2023-10-02: qty 1

## 2023-10-02 MED ORDER — ALBUTEROL SULFATE (2.5 MG/3ML) 0.083% IN NEBU: 2.5000 mg | INHALATION_SOLUTION | Freq: Four times a day (QID) | RESPIRATORY_TRACT | Status: DC | PRN

## 2023-10-02 MED ORDER — SODIUM CHLORIDE 0.9 % IV SOLN
1.0000 g | Freq: Once | INTRAVENOUS | Status: AC
  Administered 2023-10-02: 1 g via INTRAVENOUS
  Filled 2023-10-02: qty 10

## 2023-10-02 MED ORDER — ATORVASTATIN CALCIUM 20 MG PO TABS
40.0000 mg | ORAL_TABLET | ORAL | Status: DC
  Administered 2023-10-02 – 2023-10-05 (×4): 40 mg via ORAL
  Filled 2023-10-02 (×4): qty 2

## 2023-10-02 MED ORDER — TRAZODONE HCL 50 MG PO TABS
25.0000 mg | ORAL_TABLET | Freq: Every evening | ORAL | Status: DC | PRN
  Administered 2023-10-02 – 2023-10-05 (×3): 25 mg via ORAL
  Filled 2023-10-02 (×3): qty 1

## 2023-10-02 MED ORDER — DICYCLOMINE HCL 10 MG PO CAPS: 10.0000 mg | ORAL_CAPSULE | Freq: Two times a day (BID) | ORAL | Status: DC

## 2023-10-02 MED ORDER — METHOTREXATE SODIUM 2.5 MG PO TABS
17.5000 mg | ORAL_TABLET | ORAL | Status: DC
Start: 2023-10-02 — End: 2023-10-06
  Administered 2023-10-02: 17.5 mg via ORAL
  Filled 2023-10-02: qty 7

## 2023-10-02 MED ORDER — FOLIC ACID 1 MG PO TABS
1.0000 mg | ORAL_TABLET | Freq: Every day | ORAL | Status: DC
  Administered 2023-10-02 – 2023-10-05 (×4): 1 mg via ORAL
  Filled 2023-10-02 (×4): qty 1

## 2023-10-02 MED ORDER — QUETIAPINE FUMARATE 25 MG PO TABS
75.0000 mg | ORAL_TABLET | Freq: Every day | ORAL | Status: DC
  Administered 2023-10-02 – 2023-10-03 (×2): 75 mg via ORAL
  Filled 2023-10-02 (×2): qty 3

## 2023-10-02 MED ORDER — POLYETHYLENE GLYCOL 3350 17 G PO PACK
17.0000 g | PACK | Freq: Two times a day (BID) | ORAL | Status: DC
Start: 2023-10-02 — End: 2023-10-06
  Administered 2023-10-02 – 2023-10-05 (×7): 17 g via ORAL
  Filled 2023-10-02 (×8): qty 1

## 2023-10-02 MED ORDER — ASPIRIN 81 MG PO TBEC
81.0000 mg | DELAYED_RELEASE_TABLET | Freq: Every day | ORAL | Status: DC
  Administered 2023-10-02 – 2023-10-05 (×4): 81 mg via ORAL
  Filled 2023-10-02 (×4): qty 1

## 2023-10-02 MED ORDER — HYDROXYCHLOROQUINE SULFATE 200 MG PO TABS
200.0000 mg | ORAL_TABLET | Freq: Every day | ORAL | Status: DC
  Administered 2023-10-02 – 2023-10-05 (×4): 200 mg via ORAL
  Filled 2023-10-02 (×4): qty 1

## 2023-10-02 MED ORDER — INSULIN ASPART 100 UNIT/ML IJ SOLN: 0.0000 [IU] | Freq: Every day | INTRAMUSCULAR | Status: DC

## 2023-10-02 MED ORDER — GUAIFENESIN ER 600 MG PO TB12
600.0000 mg | ORAL_TABLET | Freq: Two times a day (BID) | ORAL | Status: DC
  Administered 2023-10-02 – 2023-10-05 (×8): 600 mg via ORAL
  Filled 2023-10-02 (×8): qty 1

## 2023-10-02 MED ORDER — VENLAFAXINE HCL ER 37.5 MG PO CP24
37.5000 mg | ORAL_CAPSULE | Freq: Every day | ORAL | Status: DC
  Administered 2023-10-02 – 2023-10-05 (×4): 37.5 mg via ORAL
  Filled 2023-10-02 (×5): qty 1

## 2023-10-02 MED ORDER — PHENAZOPYRIDINE HCL 200 MG PO TABS
200.0000 mg | ORAL_TABLET | Freq: Three times a day (TID) | ORAL | Status: DC
  Administered 2023-10-02 – 2023-10-04 (×7): 200 mg via ORAL
  Filled 2023-10-02 (×8): qty 1

## 2023-10-02 MED ORDER — LORATADINE 10 MG PO TABS
10.0000 mg | ORAL_TABLET | Freq: Every day | ORAL | Status: DC
  Administered 2023-10-02 – 2023-10-05 (×4): 10 mg via ORAL
  Filled 2023-10-02 (×5): qty 1

## 2023-10-02 MED ORDER — CLONAZEPAM 0.5 MG PO TABS
0.5000 mg | ORAL_TABLET | Freq: Every evening | ORAL | Status: DC | PRN
  Administered 2023-10-02 – 2023-10-04 (×3): 0.5 mg via ORAL
  Filled 2023-10-02 (×3): qty 1

## 2023-10-02 MED ORDER — APIXABAN 5 MG PO TABS
5.0000 mg | ORAL_TABLET | Freq: Two times a day (BID) | ORAL | Status: DC
  Administered 2023-10-02 – 2023-10-05 (×8): 5 mg via ORAL
  Filled 2023-10-02 (×8): qty 1

## 2023-10-02 MED ORDER — DICYCLOMINE HCL 10 MG PO CAPS: 10.0000 mg | ORAL_CAPSULE | Freq: Two times a day (BID) | ORAL | Status: DC | PRN

## 2023-10-02 MED ORDER — BISACODYL 10 MG RE SUPP: 10.0000 mg | Freq: Every day | RECTAL | Status: DC | PRN

## 2023-10-02 MED ORDER — BACLOFEN 10 MG PO TABS
10.0000 mg | ORAL_TABLET | Freq: Three times a day (TID) | ORAL | Status: DC
  Administered 2023-10-02 – 2023-10-04 (×7): 10 mg via ORAL
  Filled 2023-10-02 (×7): qty 1

## 2023-10-02 MED ORDER — ACETAMINOPHEN 650 MG RE SUPP: 650.0000 mg | Freq: Four times a day (QID) | RECTAL | Status: DC | PRN

## 2023-10-02 MED ORDER — SODIUM CHLORIDE 0.9 % IV SOLN
1.0000 g | INTRAVENOUS | Status: DC
  Administered 2023-10-03 – 2023-10-04 (×2): 1 g via INTRAVENOUS
  Filled 2023-10-02 (×2): qty 10

## 2023-10-02 MED ORDER — LEVOTHYROXINE SODIUM 100 MCG PO TABS
100.0000 ug | ORAL_TABLET | Freq: Every day | ORAL | Status: DC
  Administered 2023-10-02 – 2023-10-05 (×4): 100 ug via ORAL
  Filled 2023-10-02: qty 2
  Filled 2023-10-02 (×3): qty 1

## 2023-10-02 MED ORDER — SODIUM CHLORIDE 0.9 % IV SOLN: INTRAVENOUS | Status: AC

## 2023-10-02 MED ORDER — METHOCARBAMOL 500 MG PO TABS
500.0000 mg | ORAL_TABLET | Freq: Every evening | ORAL | Status: DC | PRN
  Administered 2023-10-02 – 2023-10-03 (×2): 500 mg via ORAL
  Filled 2023-10-02 (×2): qty 1

## 2023-10-02 MED ORDER — BISACODYL 5 MG PO TBEC
10.0000 mg | DELAYED_RELEASE_TABLET | Freq: Once | ORAL | Status: AC
  Administered 2023-10-02: 10 mg via ORAL
  Filled 2023-10-02: qty 2

## 2023-10-02 NOTE — Progress Notes (Signed)
PT Cancellation Note  Patient Details Name: Ariel Alvarez MRN: [id number] DOB: [birth year]   Cancelled Treatment:    Reason Eval/Treat Not Completed: Fatigue/lethargy limiting ability to participate (Consult received and chart reviewed.  Patient notably fatigued (s/p pain meds), unable to maintain alertness/eyes open for active participation with session.  Will re-attempt at later time/date as medically appropriate.)   Loucille Takach H. Manson Passey, PT, DPT, NCS 10/02/23, 10:52 PM [phone]

## 2023-10-02 NOTE — Evaluation (Signed)
Occupational Therapy Evaluation Patient Details Name: Ariel Alvarez MRN: [id number] DOB: February 13, 1946 Today's Date: 10/02/2023   History of Present Illness Pt is a 77 y.o. Caucasian female with medical history significant for type 2 diabetes mellitus, hypothyroidism, GERD, depression, anxiety, CVA and hypothyroidism, who presented to the ER with acute onset of generalized weakness with subsequent fall from her wheelchair while reaching the phone without injuries.   Clinical Impression   Pt was seen for OT evaluation this date. Prior to hospital admission, pt reports completing ADL's IND with the exception of needing A for upper body dressing. Pt lives alone. Pt presents to acute OT demonstrating impaired ADL performance and functional mobility 2/2 (See OT problem list for additional functional deficits). Upon entering room pt supine in bed, agreeable to Tx. Pt reported feeling pain in her abdomen and LUE. Pt completed all bed mobility with Max A-Total A x2 with use of rails. Pt requested needing to use bathroom. Pt completed STS with Max Ax2. Pt completed stand pivot from EOB to [facility] with Max Ax2. Pt voided during this session. Pt anxious and fearful of falling, pt monitored during session. Pt completed a stand pivot from BSC>EOB with Total Ax2 towards L side.  Pt noted to be very out of breath and fatigued. Pt's O2 monitored and in the high 80's. Pt placed on 1L/min via N.C and O2 in the 90's. Pt appeared to be very sleepy towards the end of the session. Once pt was supine in bed. Pt required total A for pericare and hygiene. Pt left supine in bed with call bell within reach and all needs met. Pt would benefit from skilled OT services to address noted impairments and functional limitations (see below for any additional details) in order to maximize safety and independence while minimizing falls risk and caregiver burden. Anticipate the need for follow up OT services upon acute hospital DC.        If  plan is discharge home, recommend the following: Two people to help with walking and/or transfers;Two people to help with bathing/dressing/bathroom;Assist for transportation;Help with stairs or ramp for entrance;Assistance with cooking/housework    Functional Status Assessment  Patient has had a recent decline in their functional status and demonstrates the ability to make significant improvements in function in a reasonable and predictable amount of time.  Equipment Recommendations  BSC/3in1    Recommendations for Other Services       Precautions / Restrictions Precautions Precautions: Fall Restrictions Weight Bearing Restrictions: No      Mobility Bed Mobility Overal bed mobility: Needs Assistance Bed Mobility: Supine to Sit, Sit to Supine     Supine to sit: Max assist, Used rails, +2 for physical assistance Sit to supine: Total assist, Used rails, +2 for physical assistance     Patient Response: Cooperative  Transfers Overall transfer level: Needs assistance Equipment used: 2 person hand held assist Transfers: Sit to/from Stand, Bed to chair/wheelchair/BSC Sit to Stand: Max assist, +2 physical assistance Stand pivot transfers: Total assist, +2 physical assistance                Balance Overall balance assessment: Needs assistance Sitting-balance support: Single extremity supported, Feet supported Sitting balance-Leahy Scale: Fair     Standing balance support: Bilateral upper extremity supported Standing balance-Leahy Scale: Fair                             ADL either performed or assessed with  clinical judgement   ADL Overall ADL's : Needs assistance/impaired                                     Functional mobility during ADLs: Maximal assistance;Total assistance;+2 for physical assistance General ADL Comments: Pt requested needing to use bathroom. Pt completed STS with Max Ax2. Pt completed stand pivot from EOB to [facility] with Max  Ax2. Pt voided during this session. Pt anxious and fearful of falling during session. Pt completed a stand pivot from BSC>EOB with Total Ax2. Pt noted to be very out of breath and fatigued. Pt's O2 monitored and in the 80's. Pt placed on 1L/min via N.C and O2 in the 90's. Once pt supine in bed. pt required total A for pericare and hygiene.     Vision         Perception         Praxis         Pertinent Vitals/Pain Pain Assessment Pain Assessment: Faces Faces Pain Scale: Hurts even more Pain Location: Abdominal, L arm pain Pain Descriptors / Indicators: Aching, Discomfort, Grimacing, Guarding Pain Intervention(s): Limited activity within patient's tolerance, Monitored during session, Repositioned     Extremity/Trunk Assessment Upper Extremity Assessment Upper Extremity Assessment: LUE deficits/detail;Generalized weakness LUE Deficits / Details: Hx of L hemiplegia prior CVA; contractures noted LUE: Unable to fully assess due to pain LUE Coordination: decreased gross motor;decreased fine motor   Lower Extremity Assessment Lower Extremity Assessment: Generalized weakness       Communication Communication Communication: No apparent difficulties Cueing Techniques: Verbal cues   Cognition Arousal: Alert Behavior During Therapy: WFL for tasks assessed/performed Overall Cognitive Status: Within Functional Limits for tasks assessed                                       General Comments       Exercises     Shoulder Instructions      Home Living Family/patient expects to be discharged to:: Private residence Living Arrangements: Alone   Type of Home: House Home Access: Ramped entrance     Home Layout: One level               Home Equipment: Other (comment);Wheelchair - manual [facility])   Additional Comments: Per pt report however per chart on 05/2023 pt lived with spouse.      Prior Functioning/Environment Prior Level of Function : Needs  assist               ADLs Comments: Per pt reports Ind with ADL's and requires A for putting on bras.        OT Problem List: Decreased strength;Decreased range of motion;Impaired balance (sitting and/or standing);Decreased safety awareness;Decreased activity tolerance;Decreased coordination;Decreased knowledge of use of DME or AE      OT Treatment/Interventions: Self-care/ADL training;Therapeutic exercise;Energy conservation;Therapeutic activities;Patient/family education    OT Goals(Current goals can be found in the care plan section) Acute Rehab OT Goals Patient Stated Goal: to get better OT Goal Formulation: With patient Time For Goal Achievement: 10/16/23 Potential to Achieve Goals: Fair ADL Goals Pt Will Perform Grooming: with supervision;sitting Pt Will Perform Upper Body Dressing: with min assist;standing;sitting Pt Will Transfer to Toilet: bedside commode;stand pivot transfer;with mod assist;with +2 assist  OT Frequency: Min 1X/week    Co-evaluation  AM-PAC OT "6 Clicks" Daily Activity     Outcome Measure Help from another person eating meals?: None Help from another person taking care of personal grooming?: A Little Help from another person toileting, which includes using toliet, bedpan, or urinal?: A Lot Help from another person bathing (including washing, rinsing, drying)?: Total Help from another person to put on and taking off regular upper body clothing?: A Little Help from another person to put on and taking off regular lower body clothing?: A Lot 6 Click Score: 15   End of Session Equipment Utilized During Treatment: Rolling walker (2 wheels);Gait belt  Activity Tolerance: Patient limited by pain Patient left: in bed;with call bell/phone within reach;with bed alarm set  OT Visit Diagnosis: Unsteadiness on feet (R26.81);Other abnormalities of gait and mobility (R26.89);Muscle weakness (generalized) (M62.81);History of falling (Z91.81)                 Time: 1409-1430 OT Time Calculation (min): 21 min Charges:  OT General Charges $OT Visit: 1 Visit OT Evaluation $OT Eval Low Complexity: 1 Low OT Treatments $Self Care/Home Management : 8-22 mins  Butch Penny, SOT

## 2023-10-02 NOTE — Plan of Care (Signed)
Patient was seen and examined at bedside, resting comfortably, denied any complaint. Patient was admitted due to fall from the wheelchair, she has weakness of the left arm due to prior stroke.  Saturating 92% on room air, possible hypoxia due to OSA and morbid obesity, hypoventilation syndrome. Awaiting for PT and OT eval, patient lives alone high risk for readmission and fall.  Most likely she will benefit from rehab placement.

## 2023-10-02 NOTE — Assessment & Plan Note (Signed)
-   We will continue Eliquis. 

## 2023-10-02 NOTE — Assessment & Plan Note (Signed)
-  We will continue Synthroid and check TSH.

## 2023-10-02 NOTE — Assessment & Plan Note (Addendum)
-  We will continue Plaquenil and methotrexate.

## 2023-10-02 NOTE — Assessment & Plan Note (Signed)
-

## 2023-10-02 NOTE — Assessment & Plan Note (Signed)
-   The patient will be admitted to an observation medical telemetry bed. - Her generalized weakness is likely secondary to UTI. - PT consult will be obtained given her fall.

## 2023-10-02 NOTE — Assessment & Plan Note (Signed)
-   The patient will be placed on supplemental coverage with NovoLog. 

## 2023-10-02 NOTE — Assessment & Plan Note (Signed)
-   We will continue PPI therapy 

## 2023-10-02 NOTE — H&P (Signed)
[place]   PATIENT NAME: Ariel Alvarez    MR#:  [id number]  DATE OF BIRTH:  Apr 28, 1946  DATE OF ADMISSION:  10/01/2023  PRIMARY CARE PHYSICIAN: Pcp, No   Patient is coming from: Home  REQUESTING/REFERRING PHYSICIAN: Ward, Layla Maw, DO  CHIEF COMPLAINT:   Chief Complaint  Patient presents with   Fall    EMS states patient had a fall from wheelchair while reaching for phone. EMS was called but refused trip to hospital. Denies LOC or hitting head. Pt does take eliquis.  Called this evening with increasing Left arm, L leg and abdominal pain. Is currently being treated for UTI. Husband reported to EMS that pt was more agitated this evening    HISTORY OF PRESENT ILLNESS:  Ariel Alvarez is a 77 y.o. Caucasian female with medical history significant for type 2 diabetes mellitus, hypothyroidism, GERD, depression, anxiety, CVA and hypothyroidism, who presented to the ER with acute onset of generalized weakness with subsequent fall from her wheelchair while reaching the phone without injuries.  She denies any presyncope or syncope.  No new paresthesias or focal muscle weakness.  She admits to urinary frequency and urgency as well as dysuria without hematuria or flank pain.  She admitted to tactile fever and chills.  She had nausea and vomiting a couple of days ago with mild abdominal pain.  She was having left arm, left leg and abdominal pain.  Per her husband she was more agitated this evening.  She was having mild neck pain.  ED Course: When the patient came to the ER, vital signs were within normal and leukocytosis 16.2 and otherwise unremarkable BMP.  CBC was normal.  UA was positive for nitrite and suspicious for UTI. EKG as reviewed by me : None Imaging: Noncontrast head CT scan and C-spine CT showed no acute intracranial abnormalities or C-spine abnormalities..  Chest abdomen pelvis CT with contrast revealed no acute abnormalities.  It showed constipation and diverticulosis  without diverticulitis.  The patient was given 4 mg of IV morphine sulfate and 1 g of IV Rocephin.  She will be admitted to a medical telemetry observation bed for further evaluation and management. PAST MEDICAL HISTORY:   Past Medical History:  Diagnosis Date   Anxiety    Arthritis    RIGHT HAND   Collagen vascular disease (HCC)    Complication of anesthesia    HARD TO WAKE UP AFTER  ONE SURGERY-PT STAETS SHE WAS GIVEN TOO MUCH ANESTHESIA   Depression    Dyspnea    VERY RARE WITH EXERTION   GERD (gastroesophageal reflux disease)    History of kidney stones    H/O   Hypothyroidism    Stroke (HCC) [id number]   X2-LEFT HAD PARALYZED   -Type 2 diabetes mellitus  PAST SURGICAL HISTORY:   Past Surgical History:  Procedure Laterality Date   CHOLECYSTECTOMY     KIDNEY STONE SURGERY     METATARSAL HEAD EXCISION Bilateral 04/15/2020   Procedure: METATARSAL HEAD PARTIAL EXCISION - BILATERAL;  Surgeon: Linus Galas, DPM;  Location: [facility] ORS;  Service: Podiatry;  Laterality: Bilateral;    SOCIAL HISTORY:   Social History   Tobacco Use   Smoking status: Former    Current packs/day: 0.00    Average packs/day: 1 pack/day for 20.0 years (20.0 ttl pk-yrs)    Types: Cigarettes    Start date: 10/29/1954    Quit date: 10/29/1974    Years since quitting: 48.9  Smokeless tobacco: Never  Substance Use Topics   Alcohol use: Not Currently    Comment: WINE OCC    FAMILY HISTORY:   Family History  Problem Relation Age of Onset   Alcohol abuse Father    Depression Father    Suicidality Father    Stroke Paternal Grandmother    Diabetes Son    Cancer Paternal Aunt     DRUG ALLERGIES:   Allergies  Allergen Reactions   Latex Hives   Prednisone Nausea And Vomiting    Pt states causes N/V even when taken with food.    REVIEW OF SYSTEMS:   ROS As per history of present illness. All pertinent systems were reviewed above. Constitutional, HEENT, cardiovascular, respiratory, GI, GU,  musculoskeletal, neuro, psychiatric, endocrine, integumentary and hematologic systems were reviewed and are otherwise negative/unremarkable except for positive findings mentioned above in the HPI.   MEDICATIONS AT HOME:   Prior to Admission medications   Medication Sig Start Date End Date Taking? Authorizing Provider  acetaminophen (TYLENOL) 325 MG tablet Take 325-650 mg by mouth every 6 (six) hours as needed for mild pain or moderate pain.    [provider]  albuterol (VENTOLIN HFA) 108 (90 Base) MCG/ACT inhaler Inhale 2 puffs into the lungs every 6 (six) hours as needed for wheezing or shortness of breath.    [provider]  apixaban (ELIQUIS) 5 MG TABS tablet Take 1 tablet (5 mg total) by mouth 2 (two) times daily. Hold Eliquis till 12/16/22 and if no further bleeding, resume on 12/17/22 12/17/22 06/09/23  Delfino Lovett, MD  aspirin EC 81 MG tablet Take 1 tablet (81 mg total) by mouth at bedtime. Hold today. Resume on 12/15/22 if no further bleeding 12/15/22   Delfino Lovett, MD  atorvastatin (LIPITOR) 40 MG tablet Take 40 mg by mouth every morning.     [provider]  baclofen (LIORESAL) 10 MG tablet Take 1 tablet (10 mg total) by mouth 3 (three) times daily. 09/28/23   Becky Augusta, NP  cetirizine (ZYRTEC) 10 MG tablet Take 10 mg by mouth daily after lunch.     [provider]  ciprofloxacin (CIPRO) 500 MG tablet Take 1 tablet (500 mg total) by mouth 2 (two) times daily. 09/28/23   Becky Augusta, NP  clonazePAM (KLONOPIN) 0.5 MG tablet Take 0.5 mg by mouth at bedtime as needed.    [provider]  clotrimazole (LOTRIMIN) 1 % cream Apply to affected area 2 times daily Patient taking differently: 1 Application 2 (two) times daily as needed. Apply to affected area 2 times daily 09/13/22   Eusebio Friendly B, PA-C  desvenlafaxine (PRISTIQ) 50 MG 24 hr tablet Take 50 mg by mouth daily. 06/15/21   [provider]  dicyclomine (BENTYL) 10 MG capsule Take 10  mg by mouth 2 (two) times daily.    [provider]  folic acid (FOLVITE) 1 MG tablet Take 1 mg by mouth daily.    [provider]  gabapentin (NEURONTIN) 300 MG capsule Take 300 mg by mouth 3 (three) times daily.    [provider]  guaiFENesin (MUCINEX) 600 MG 12 hr tablet Take 1 tablet (600 mg total) by mouth 2 (two) times daily. 06/10/23   Sunnie Nielsen, DO  hydroxychloroquine (PLAQUENIL) 200 MG tablet Take 200 mg by mouth daily.    [provider]  ketoconazole (NIZORAL) 2 % cream Apply 1 Application topically daily. 02/25/23   [provider]  levothyroxine (SYNTHROID) 100 MCG tablet  Take 100 mcg by mouth daily before breakfast. 08/05/20   [provider]  methocarbamol (ROBAXIN) 500 MG tablet Take 500 mg by mouth at bedtime as needed for muscle spasms.    [provider]  methotrexate (RHEUMATREX) 2.5 MG tablet Take 17.5 mg by mouth every Wednesday.    [provider]  MOUNJARO 7.5 MG/0.5ML Pen SMARTSIG:7.5 Milligram(s) SUB-Q Once a Week 08/14/23   [provider]  nystatin (MYCOSTATIN/NYSTOP) powder Apply 1 Application topically 3 (three) times daily. 12/06/22 12/06/23  [provider]  omeprazole (PRILOSEC) 20 MG capsule Take 20 mg by mouth every morning.     [provider]  phenazopyridine (PYRIDIUM) 200 MG tablet Take 1 tablet (200 mg total) by mouth 3 (three) times daily. 09/28/23   Becky Augusta, NP  QUEtiapine (SEROQUEL) 50 MG tablet Take 1.5 tablets (75 mg total) by mouth at bedtime. Dose increase 10/03/21   Jomarie Longs, MD  eszopiclone (LUNESTA) 2 MG TABS tablet Take 2 mg by mouth at bedtime as needed for sleep. Take immediately before bedtime Patient not taking: Reported on 04/14/2020  09/08/20  [provider]  phentermine 30 MG capsule Take 30 mg by mouth every morning.  09/08/20  [provider]  warfarin (COUMADIN) 2.5 MG tablet Take 2.5 mg by mouth. Patient not  taking: Reported on 04/14/2020  09/08/20  [provider]      VITAL SIGNS:  Blood pressure 137/61, pulse 88, temperature 98.1 F (36.7 C), temperature source Oral, resp. rate 18, SpO2 93%.  PHYSICAL EXAMINATION:  Physical Exam  GENERAL:  77 y.o.-year-old Caucasian female patient lying in the bed with no acute distress.  EYES: Pupils equal, round, reactive to light and accommodation. No scleral icterus. Extraocular muscles intact.  HEENT: Head atraumatic, normocephalic. Oropharynx and nasopharynx clear.  NECK:  Supple, no jugular venous distention. No thyroid enlargement, no tenderness.  LUNGS: Normal breath sounds bilaterally, no wheezing, rales,rhonchi or crepitation. No use of accessory muscles of respiration.  CARDIOVASCULAR: Regular rate and rhythm, S1, S2 normal. No murmurs, rubs, or gallops.  ABDOMEN: Soft, nondistended, nontender. Bowel sounds present. No organomegaly or mass.  EXTREMITIES: No pedal edema, cyanosis, or clubbing.  NEUROLOGIC: Cranial nerves II through XII are intact. Muscle strength 5/5 in all extremities. Sensation intact. Gait not checked.  PSYCHIATRIC: The patient is alert and oriented x 3.  Normal affect and good eye contact. SKIN: No obvious rash, lesion, or ulcer.   LABORATORY PANEL:   CBC Recent Labs  Lab 10/01/23 2211  WBC 7.4  HGB 12.1  HCT 39.2  PLT 186   ------------------------------------------------------------------------------------------------------------------  Chemistries  Recent Labs  Lab 09/30/23 0400 10/01/23 2211  NA 142 140  K 3.8 3.6  CL 106 107  CO2 29 27  GLUCOSE 169* 256*  BUN 7* 8  CREATININE 0.60 0.66  CALCIUM 8.5* 8.2*  AST 33  --   ALT 23  --   ALKPHOS 77  --   BILITOT 1.0  --    ------------------------------------------------------------------------------------------------------------------  Cardiac Enzymes No results for input(s): "TROPONINI" in the last 168  hours. ------------------------------------------------------------------------------------------------------------------  RADIOLOGY:  CT CHEST ABDOMEN PELVIS W CONTRAST  Result Date: 10/01/2023 CLINICAL DATA:  ground level fall, chest and abdominal pain, evaluating traumatic injuries EXAM: CT CHEST, ABDOMEN, AND PELVIS WITH CONTRAST TECHNIQUE: Multidetector CT imaging of the chest, abdomen and pelvis was performed following the standard protocol during bolus administration of intravenous contrast. RADIATION DOSE REDUCTION: This exam was performed according to the departmental dose-optimization program  which includes automated exposure control, adjustment of the mA and/or kV according to patient size and/or use of iterative reconstruction technique. CONTRAST:  OMNIPAQUE IOHEXOL 350 MG/ML SOLN COMPARISON:  CT abdomen pelvis 09/30/2023 FINDINGS: CHEST: Cardiovascular: No aortic injury. The thoracic aorta is normal in caliber. The heart is normal in size. No significant pericardial effusion. Mild atherosclerotic plaque. Mediastinum/Nodes: No pneumomediastinum. No mediastinal hematoma. The esophagus is unremarkable. The thyroid is unremarkable. The central airways are patent. No mediastinal, hilar, or axillary lymphadenopathy. Lungs/Pleura: No focal consolidation. No pulmonary nodule. No pulmonary mass. No pulmonary contusion or laceration. No pneumatocele formation. No pleural effusion. No pneumothorax. No hemothorax. Musculoskeletal/Chest wall: No chest wall mass. No acute rib or sternal fracture. Please see separately dictated CT thoracolumbar spine. ABDOMEN / PELVIS: Hepatobiliary: Not enlarged. No focal lesion. No laceration or subcapsular hematoma. Status post cholecystectomy.  No biliary ductal dilatation. Pancreas: Normal pancreatic contour. No main pancreatic duct dilatation. Spleen: Not enlarged. No focal lesion. No laceration, subcapsular hematoma, or vascular injury. Adrenals/Urinary Tract: No  nodularity bilaterally. Bilateral kidneys enhance symmetrically. No hydronephrosis. No contusion, laceration, or subcapsular hematoma. No injury to the vascular structures or collecting systems. No hydroureter. The urinary bladder is unremarkable. Stomach/Bowel: No small or large bowel wall thickening or dilatation. Colonic diverticulosis. Stool throughout the colon. The appendix is not definitely identified with no inflammatory changes in the right lower quadrant to suggest acute appendicitis. Vasculature/Lymphatics: Severe atherosclerotic plaque. Venous collaterals along the lower anterior abdominal wall. No abdominal aorta or iliac aneurysm. No active contrast extravasation or pseudoaneurysm. No abdominal, pelvic, inguinal lymphadenopathy. Reproductive: Status post hysterectomy. Other: No simple free fluid ascites. No pneumoperitoneum. No hemoperitoneum. No mesenteric hematoma identified. No organized fluid collection. Musculoskeletal: No significant soft tissue hematoma. Diastasis rectus. Tiny of the lung hernia. No acute pelvic fracture. Please see separately dictated CT thoracolumbar spine. Ports and Devices: None. IMPRESSION: 1. No acute intrathoracic, intra-abdominal, intrapelvic traumatic injury. 2. Please see separately dictated CT thoracolumbar spine 10/01/2023. 3. Other imaging findings of potential clinical significance: Colonic diverticulosis with no acute diverticulitis. Stool throughout the colon-correlate for constipation. Electronically Signed   By: Tish Frederickson M.D.   On: 10/01/2023 23:52   CT T-SPINE NO CHARGE  Result Date: 10/01/2023 CLINICAL DATA:  Ground level fall. EXAM: CT THORACIC AND LUMBAR SPINE WITHOUT CONTRAST TECHNIQUE: Multidetector CT imaging of the thoracic and lumbar spine was performed without contrast. Multiplanar CT image reconstructions were also generated. RADIATION DOSE REDUCTION: This exam was performed according to the departmental dose-optimization program which  includes automated exposure control, adjustment of the mA and/or kV according to patient size and/or use of iterative reconstruction technique. COMPARISON:  None Available. FINDINGS: CT THORACIC SPINE FINDINGS Alignment: Normal. Vertebrae: Multilevel mild osteophyte formation. No acute fracture or focal pathologic process. Paraspinal and other soft tissues: Negative. Disc levels: Maintained. CT LUMBAR SPINE FINDINGS Segmentation: 5 lumbar type vertebrae. Alignment: Normal. Vertebrae: No acute fracture or focal pathologic process. Paraspinal and other soft tissues: Negative. Disc levels: Maintained peer IMPRESSION: CT THORACIC SPINE IMPRESSION No acute displaced fracture or traumatic listhesis of the thoracic spine. CT LUMBAR SPINE IMPRESSION No acute displaced fracture or traumatic listhesis of the lumbar spine. Electronically Signed   By: Tish Frederickson M.D.   On: 10/01/2023 23:30   CT L-SPINE NO CHARGE  Result Date: 10/01/2023 CLINICAL DATA:  Ground level fall. EXAM: CT THORACIC AND LUMBAR SPINE WITHOUT CONTRAST TECHNIQUE: Multidetector CT imaging of the thoracic and lumbar spine was performed without contrast. Multiplanar CT  image reconstructions were also generated. RADIATION DOSE REDUCTION: This exam was performed according to the departmental dose-optimization program which includes automated exposure control, adjustment of the mA and/or kV according to patient size and/or use of iterative reconstruction technique. COMPARISON:  None Available. FINDINGS: CT THORACIC SPINE FINDINGS Alignment: Normal. Vertebrae: Multilevel mild osteophyte formation. No acute fracture or focal pathologic process. Paraspinal and other soft tissues: Negative. Disc levels: Maintained. CT LUMBAR SPINE FINDINGS Segmentation: 5 lumbar type vertebrae. Alignment: Normal. Vertebrae: No acute fracture or focal pathologic process. Paraspinal and other soft tissues: Negative. Disc levels: Maintained peer IMPRESSION: CT THORACIC SPINE  IMPRESSION No acute displaced fracture or traumatic listhesis of the thoracic spine. CT LUMBAR SPINE IMPRESSION No acute displaced fracture or traumatic listhesis of the lumbar spine. Electronically Signed   By: Tish Frederickson M.D.   On: 10/01/2023 23:30   CT Head Wo Contrast  Result Date: 10/01/2023 CLINICAL DATA:  Ground-level fall, unsure head injury, prior history of CVA, evaluating for ICH; Ground-level fall, tenderness palpation to C-spine EXAM: CT HEAD WITHOUT CONTRAST CT CERVICAL SPINE WITHOUT CONTRAST TECHNIQUE: Multidetector CT imaging of the head and cervical spine was performed following the standard protocol without intravenous contrast. Multiplanar CT image reconstructions of the cervical spine were also generated. RADIATION DOSE REDUCTION: This exam was performed according to the departmental dose-optimization program which includes automated exposure control, adjustment of the mA and/or kV according to patient size and/or use of iterative reconstruction technique. COMPARISON:  CT head 03/10/2023 FINDINGS: CT HEAD FINDINGS Brain: Cerebral ventricle sizes are concordant with the degree of cerebral volume loss. Patchy and confluent areas of decreased attenuation are noted throughout the deep and periventricular white matter of the cerebral hemispheres bilaterally, compatible with chronic microvascular ischemic disease. Similar-appearing ring right MCA territory, bilateral cerebellar, left basal ganglia encephalomalacia. No evidence of large-territorial acute infarction. No parenchymal hemorrhage. No mass lesion. No extra-axial collection. No mass effect or midline shift. No hydrocephalus. Basilar cisterns are patent. Vascular: No hyperdense vessel. Skull: No acute fracture or focal lesion. Sinuses/Orbits: Paranasal sinuses and mastoid air cells are clear. The orbits are unremarkable. Other: None. CT CERVICAL SPINE FINDINGS Alignment: Normal. Skull base and vertebrae: Multilevel degenerative changes  of the spine most prominent at the C5-C7 levels. No associated severe osseous neural foraminal or central canal stenosis. No acute fracture. No aggressive appearing focal osseous lesion or focal pathologic process. Soft tissues and spinal canal: No prevertebral fluid or swelling. No visible canal hematoma. Upper chest: Unremarkable. Other: Retropharyngeal course of the carotid arteries within the neck. IMPRESSION: 1. No acute intracranial abnormality. 2. No acute displaced fracture or traumatic listhesis of the cervical spine. Electronically Signed   By: Tish Frederickson M.D.   On: 10/01/2023 23:27   CT Cervical Spine Wo Contrast  Result Date: 10/01/2023 CLINICAL DATA:  Ground-level fall, unsure head injury, prior history of CVA, evaluating for ICH; Ground-level fall, tenderness palpation to C-spine EXAM: CT HEAD WITHOUT CONTRAST CT CERVICAL SPINE WITHOUT CONTRAST TECHNIQUE: Multidetector CT imaging of the head and cervical spine was performed following the standard protocol without intravenous contrast. Multiplanar CT image reconstructions of the cervical spine were also generated. RADIATION DOSE REDUCTION: This exam was performed according to the departmental dose-optimization program which includes automated exposure control, adjustment of the mA and/or kV according to patient size and/or use of iterative reconstruction technique. COMPARISON:  CT head 03/10/2023 FINDINGS: CT HEAD FINDINGS Brain: Cerebral ventricle sizes are concordant with the degree of cerebral volume loss. Patchy and confluent  areas of decreased attenuation are noted throughout the deep and periventricular white matter of the cerebral hemispheres bilaterally, compatible with chronic microvascular ischemic disease. Similar-appearing ring right MCA territory, bilateral cerebellar, left basal ganglia encephalomalacia. No evidence of large-territorial acute infarction. No parenchymal hemorrhage. No mass lesion. No extra-axial collection. No mass  effect or midline shift. No hydrocephalus. Basilar cisterns are patent. Vascular: No hyperdense vessel. Skull: No acute fracture or focal lesion. Sinuses/Orbits: Paranasal sinuses and mastoid air cells are clear. The orbits are unremarkable. Other: None. CT CERVICAL SPINE FINDINGS Alignment: Normal. Skull base and vertebrae: Multilevel degenerative changes of the spine most prominent at the C5-C7 levels. No associated severe osseous neural foraminal or central canal stenosis. No acute fracture. No aggressive appearing focal osseous lesion or focal pathologic process. Soft tissues and spinal canal: No prevertebral fluid or swelling. No visible canal hematoma. Upper chest: Unremarkable. Other: Retropharyngeal course of the carotid arteries within the neck. IMPRESSION: 1. No acute intracranial abnormality. 2. No acute displaced fracture or traumatic listhesis of the cervical spine. Electronically Signed   By: Tish Frederickson M.D.   On: 10/01/2023 23:27   CT ABDOMEN PELVIS W CONTRAST  Result Date: 09/30/2023 CLINICAL DATA:  Abdominal pain.  Currently under treatment for UTI. EXAM: CT ABDOMEN AND PELVIS WITH CONTRAST TECHNIQUE: Multidetector CT imaging of the abdomen and pelvis was performed using the standard protocol following bolus administration of intravenous contrast. RADIATION DOSE REDUCTION: This exam was performed according to the departmental dose-optimization program which includes automated exposure control, adjustment of the mA and/or kV according to patient size and/or use of iterative reconstruction technique. CONTRAST:  OMNIPAQUE IOHEXOL 350 MG/ML SOLN COMPARISON:  CT pelvis without contrast 07/11/2021, CT abdomen pelvis with IV contrast 02/16/2017. FINDINGS: Lower chest: Lung bases are clear with mild chronic elevation of the right hemidiaphragm. The cardiac size is normal. There are single-vessel calcifications in the LAD coronary artery. Hepatobiliary: No focal liver abnormality is seen.  Status post cholecystectomy. No biliary dilatation. The liver is mildly steatotic. Pancreas: There is fatty infiltration but no focal abnormality. Spleen: No abnormality. Adrenals/Urinary Tract: Adrenal glands are unremarkable. Kidneys are normal, without renal calculi, focal lesion, or hydronephrosis. Bladder is unremarkable for the degree of distention. Stomach/Bowel: No dilatation or wall thickening. An appendix is not seen in this patient. There is mild-to-moderate fecal stasis. Colonic diverticulosis without evidence of diverticulitis. Vascular/Lymphatic: There is patchy aortoiliac atherosclerosis without aneurysm. Circumaortic left renal vein. There are abdominal wall varices emptying into the common femoral veins. No lymphadenopathy is seen. Reproductive: Status post hysterectomy. No adnexal masses. Other: Small umbilical fat hernia. No incarcerated hernia. No free air, hemorrhage or fluid. Musculoskeletal: Osteopenia with mild degenerative changes of the spine. No new osseous abnormality. IMPRESSION: 1. No acute CT findings in the abdomen or pelvis. 2. Constipation and diverticulosis. 3. Aortic and coronary artery atherosclerosis. 4. 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 This was seen previously. 5. Umbilical fat hernia. 6. Osteopenia and degenerative change. Aortic Atherosclerosis (ICD10-I70.0). Electronically Signed   By: Almira Bar M.D.   On: 09/30/2023 05:11      IMPRESSION AND PLAN:  Assessment and Plan: * Generalized weakness - The patient will be admitted to an observation medical telemetry bed. - Her generalized weakness is likely secondary to UTI. - PT consult will be obtained given her fall.  Acute lower UTI - This is highly suspicious given her positive nitrite though there was no significant pyuria. - We will continue IV Rocephin  and follow urine culture and sensitivity.  History of pulmonary embolism - We will continue Eliquis.  Dyslipidemia -  Continue statin therapy.  Hypothyroidism - We will continue Synthroid and check TSH..  Type 2 diabetes mellitus with peripheral neuropathy (HCC) - The patient will be placed on supplemental coverage with NovoLog. - We will continue Neurontin.  Anxiety and depression .  Will continue Klonopin, Effexor XR and Seroquel  History of vasculitis - We will continue Plaquenil and methotrexate.  GERD without esophagitis - We will continue PPI therapy.     DVT prophylaxis: Eliquis. Advanced Care Planning:  Code Status: full code.  Family Communication:  The plan of care was discussed in details with the patient (and family). I answered all questions. The patient agreed to proceed with the above mentioned plan. Further management will depend upon hospital course. Disposition Plan: Back to previous home environment Consults called: none.  All the records are reviewed and case discussed with ED provider.  Status is: Observation  I certify that at the time of admission, it is my clinical judgment that the patient will require  hospital care extending less than 2 midnights.                            Dispo: The patient is from: Home              Anticipated d/c is to: Home              Patient currently is not medically stable to d/c.              Difficult to place patient: No  Hannah Beat M.D on 10/02/2023 at 3:46 AM  [facility]   From 7 PM-7 AM, contact night-coverage [email or web address]  CC: Primary care physician; Pcp, No

## 2023-10-02 NOTE — ED Provider Notes (Signed)
2:53 AM  Assumed care at shift change.  Patient's CTs reviewed and interpreted by myself and radiologist and showed no acute abnormality.  She continues to complain of abdominal pain.  No acute findings seen on CT of the abdomen pelvis.  She is not able to ambulate here without significant assistance and lives at home alone.  Normally ambulates with a walker to and from the bathroom but does have a wheelchair.  Urine is nitrite positive with bacteria.  Will treat for UTI.  Given weakness, fall, UTI, will discuss with hospitalist.  3:11 AM  Consulted and discussed patient's case with hospitalist, Dr. Arville Care.  I have recommended admission and consulting physician agrees and will place admission orders.  Patient (and family if present) agree with this plan.   I reviewed all nursing notes, vitals, pertinent previous records.  All labs, EKGs, imaging ordered have been independently reviewed and interpreted by myself.    Galo Sayed, Layla Maw, DO 10/02/23 [phone]

## 2023-10-02 NOTE — Assessment & Plan Note (Signed)
-   This is highly suspicious given her positive nitrite though there was no significant pyuria. - We will continue IV Rocephin and follow urine culture and sensitivity.

## 2023-10-02 NOTE — Assessment & Plan Note (Signed)
.    Will continue Klonopin, Effexor XR and Seroquel

## 2023-10-02 NOTE — Assessment & Plan Note (Signed)
Continue statin therapy.

## 2023-10-03 DIAGNOSIS — R531 Weakness: Secondary | ICD-10-CM | POA: Diagnosis not present

## 2023-10-03 LAB — GLUCOSE, CAPILLARY
Glucose-Capillary: 110 mg/dL — ABNORMAL HIGH (ref 70–99)
Glucose-Capillary: 128 mg/dL — ABNORMAL HIGH (ref 70–99)
Glucose-Capillary: 102 mg/dL — ABNORMAL HIGH (ref 70–99)
Glucose-Capillary: 117 mg/dL — ABNORMAL HIGH (ref 70–99)

## 2023-10-03 LAB — URINE CULTURE: Culture: NO GROWTH

## 2023-10-03 MED ORDER — HYDROMORPHONE HCL 1 MG/ML IJ SOLN
1.0000 mg | INTRAMUSCULAR | Status: DC | PRN
  Administered 2023-10-03 – 2023-10-04 (×3): 1 mg via INTRAVENOUS
  Filled 2023-10-03 (×3): qty 1

## 2023-10-03 MED ORDER — OXYCODONE HCL 5 MG PO TABS: 5.0000 mg | ORAL_TABLET | Freq: Four times a day (QID) | ORAL | Status: DC | PRN

## 2023-10-03 NOTE — Progress Notes (Signed)
[facility] Progress Note  Patient: Ariel Alvarez    ZOX:[id number]  DOA: 10/01/2023     Date of Service: the patient was seen and examined on 10/03/2023  Chief Complaint  Patient presents with   Fall    EMS states patient had a fall from wheelchair while reaching for phone. EMS was called but refused trip to hospital. Denies LOC or hitting head. Pt does take eliquis.  Called this evening with increasing Left arm, L leg and abdominal pain. Is currently being treated for UTI. Husband reported to EMS that pt was more agitated this evening   Brief hospital course: Ariel Alvarez is a 77 y.o. Caucasian female with medical history significant for type 2 diabetes mellitus, hypothyroidism, GERD, depression, anxiety, CVA and hypothyroidism, who presented to the ER with acute onset of generalized weakness with subsequent fall from her wheelchair while reaching the phone without injuries.  She denies any presyncope or syncope.  No new paresthesias or focal muscle weakness.  She admits to urinary frequency and urgency as well as dysuria without hematuria or flank pain.  She admitted to tactile fever and chills.  She had nausea and vomiting a couple of days ago with mild abdominal pain.  She was having left arm, left leg and abdominal pain.  Per her husband she was more agitated this evening.  She was having mild neck pain.   ED Course: When the patient came to the ER, vital signs were within normal and leukocytosis 16.2 and otherwise unremarkable BMP.  CBC was normal.  UA was positive for nitrite and suspicious for UTI. EKG as reviewed by me : None Imaging: Noncontrast head CT scan and C-spine CT showed no acute intracranial abnormalities or C-spine abnormalities..   Chest abdomen pelvis CT with contrast revealed no acute abnormalities.  It showed constipation and diverticulosis without diverticulitis.   The patient was given 4 mg of IV morphine sulfate and 1 g of IV Rocephin.  She will be admitted  to a medical telemetry observation bed for further evaluation and management.  Assessment and Plan:  # Generalized weakness  - Her generalized weakness is likely secondary to UTI. - Follow PT/OT eval for possible placement Follow TOC for placement   Acute lower UTI - This is highly suspicious given her positive nitrite though there was no significant pyuria. - continue IV Rocephin  Follow urine culture and sensitivity.    History of pulmonary embolism - continue Eliquis.   Dyslipidemia - Continue statin therapy.   Hypothyroidism - continue Synthroid and  TSH 4.09 slightly elevated, recommend to follow with PCP to titrate Synthroid dose accordingly   Type 2 diabetes mellitus with peripheral neuropathy  HbA1c 6.2 well-controlled, prediabetic range - The patient will be placed on supplemental coverage with NovoLog. - continue Neurontin.   Anxiety and depression . continue Klonopin, Effexor XR and Seroquel   History of vasculitis - continue Plaquenil and methotrexate.   GERD without esophagitis - continue PPI therapy.  Morbid obesity BMI 48 Calorie restricted diet advised to lose body weight.  Diet: Heart healthy/carb modified diet DVT Prophylaxis: Therapeutic Anticoagulation with Eliquis    Advance goals of care discussion: Full code  Family Communication: family was not present at bedside, at the time of interview.  The pt provided permission to discuss medical plan with the family. Opportunity was given to ask question and all questions were answered satisfactorily.   Disposition:  Pt is from home, admitted with fall and UTI, still has  risk of fall, needs PT and OT eval and possible placement to SNF, which precludes a safe discharge. Discharge to SNF TBD after PT and OT eval. Stable to DC, when bed will be available.  Follow TOC for placement  Subjective: No significant events overnight, patient seems to be in discomfort due to unable to move herself in the bed.   RN was advised to help her and she wanted to move bowels, bedpan will be provided.   Physical Exam: General: NAD, lying comfortably Appear in no distress, affect appropriate Eyes: PERRLA ENT: Oral Mucosa Clear, moist  Neck: no JVD,  Cardiovascular: S1 and S2 Present, no Murmur,  Respiratory: good respiratory effort, Bilateral Air entry equal and Decreased, no Crackles, no wheezes Abdomen: Bowel Sound present, Soft and no tenderness,  Skin: no rashes Extremities: no Pedal edema, no calf tenderness Neurologic: Residual weakness left arm, left hand contracted and stiff elbow. Gait not checked due to patient safety concerns  Vitals:   10/02/23 1946 10/03/23 0358 10/03/23 0810 10/03/23 1113  BP: 132/66 (!) 99/54 135/62   Pulse: 84 74 77 76  Resp: 20 18 18    Temp: 97.9 F (36.6 C) 98.5 F (36.9 C) 98.1 F (36.7 C)   TempSrc: Oral Oral Oral   SpO2: 93% 96% 94% 96%   No intake or output data in the 24 hours ending 10/03/23 1321 There were no vitals filed for this visit.  Data Reviewed: I have personally reviewed and interpreted daily labs, tele strips, imagings as discussed above. I reviewed all nursing notes, pharmacy notes, vitals, pertinent old records I have discussed plan of care as described above with RN and patient/family.  CBC: Recent Labs  Lab 09/30/23 0400 10/01/23 2211 10/02/23 0350  WBC 5.5 7.4 8.1  NEUTROABS 3.1 4.8  --   HGB 12.8 12.1 12.6  HCT 40.7 39.2 40.2  MCV 98.5 99.5 99.3  PLT 195 186 184   Basic Metabolic Panel: Recent Labs  Lab 09/30/23 0400 10/01/23 2211 10/02/23 0350  NA 142 140 139  K 3.8 3.6 3.7  CL 106 107 106  CO2 29 27 28   GLUCOSE 169* 256* 175*  BUN 7* 8 8  CREATININE 0.60 0.66 0.67  CALCIUM 8.5* 8.2* 8.2*    Studies: No results found.  Scheduled Meds:  apixaban  5 mg Oral BID   aspirin EC  81 mg Oral Daily   atorvastatin  40 mg Oral BH-q7a   baclofen  10 mg Oral TID   bisacodyl  10 mg Oral QHS   folic acid  1 mg Oral  Daily   gabapentin  300 mg Oral TID   guaiFENesin  600 mg Oral BID   hydroxychloroquine  200 mg Oral Daily   insulin aspart  0-15 Units Subcutaneous TID WC   insulin aspart  0-5 Units Subcutaneous QHS   levothyroxine  100 mcg Oral Q0600   loratadine  10 mg Oral Daily   methotrexate  17.5 mg Oral Q Wed   pantoprazole  40 mg Oral Daily   phenazopyridine  200 mg Oral TID   polyethylene glycol  17 g Oral BID   QUEtiapine  75 mg Oral QHS   venlafaxine XR  37.5 mg Oral Q breakfast   Continuous Infusions:  cefTRIAXone (ROCEPHIN)  IV 1 g (10/03/23 0229)   PRN Meds: acetaminophen **OR** acetaminophen, albuterol, bisacodyl, clonazePAM, dicyclomine, HYDROmorphone (DILAUDID) injection, magnesium hydroxide, methocarbamol, ondansetron **OR** ondansetron (ZOFRAN) IV, oxyCODONE, traZODone  Time spent: 35 minutes  Author:  Hildagarde Holleran. MD [facility] 10/03/2023 1:21 PM  To reach On-call, see care teams to locate the attending and reach out to them via [email or web address]. If 7PM-7AM, please contact night-coverage If you still have difficulty reaching the attending provider, please page the [facility] (Director on Call) for [facility] on amion for assistance.

## 2023-10-03 NOTE — Evaluation (Signed)
Physical Therapy Evaluation Patient Details Name: Ariel Alvarez MRN: [id number] DOB: 01/11/1946 Today's Date: 10/03/2023  History of Present Illness  Pt is a 77 y.o. Caucasian female with medical history significant for type 2 diabetes mellitus, hypothyroidism, GERD, depression, anxiety, CVA and hypothyroidism, who presented to the ER with acute onset of generalized weakness with subsequent fall from her wheelchair while reaching the phone without injuries.  Clinical Impression  Patient resting in bed upon arrival to room; opens eyes to voice, light touch but requires near-constant stimulation to maintain alertness and attention to task.  Patient oriented to self only; inconsistently follows commands.  Very limited ability to maintain conversation, answer questions; often trailing-off during conversation.  Globally weak and deconditioned throughout all extremities; baseline L hemiplegia noted (with associated contracture/ROM deficit).  Grimacing/groaning with any/all functional movement activities; appears to improve with repositioning. Currently requiring total assist +2 for all repositioning, bed mobility; max assist for edge of bed sitting, R UE support.  Unsafe/unable to tolerate any further mobility attempts; patient slightly agitated/restless at times (but calms/returns to sleep nearly immediately upon return to supine), spontaneously requesting/attempting return to bed.  Do anticipate need for +2 vs hoyer lift for any/all OOB attempts. Would benefit from skilled PT to address above deficits and promote optimal return to PLOF.; recommend post-acute PT follow up as indicated by interdisciplinary care team.          If plan is discharge home, recommend the following: Two people to help with walking and/or transfers;Two people to help with bathing/dressing/bathroom   Can travel by private vehicle   No    Equipment Recommendations    Recommendations for Other Services       Functional  Status Assessment Patient has had a recent decline in their functional status and demonstrates the ability to make significant improvements in function in a reasonable and predictable amount of time.     Precautions / Restrictions Precautions Precautions: Fall Restrictions Weight Bearing Restrictions: No      Mobility  Bed Mobility Overal bed mobility: Needs Assistance Bed Mobility: Supine to Sit, Sit to Supine, Rolling Rolling: Total assist, +2 for safety/equipment   Supine to sit: Max assist, Total assist, +2 for physical assistance Sit to supine: +2 for physical assistance, Max assist, Total assist   General bed mobility comments: very limited ability to actively assist with repositioning and bed mobility; mod/max assist for unsupported sitting balance    Transfers                   General transfer comment: unsafe/unable    Ambulation/Gait               General Gait Details: unsafe/unable  Stairs            Wheelchair Mobility     Tilt Bed    Modified Rankin (Stroke Patients Only)       Balance Overall balance assessment: Needs assistance Sitting-balance support: No upper extremity supported, Feet supported Sitting balance-Leahy Scale: Poor         Standing balance comment: unsafe/unable                             Pertinent Vitals/Pain Pain Assessment Pain Assessment: Faces Faces Pain Scale: Hurts whole lot Pain Location: generalized pain, R UE/LE, abdomen Pain Descriptors / Indicators: Moaning, Grimacing Pain Intervention(s): Limited activity within patient's tolerance, Monitored during session, Repositioned    Home Living Family/patient expects  to be discharged to:: Private residence Living Arrangements: Alone Available Help at Discharge: Family Type of Home: House Home Access: Ramped entrance       Home Layout: One level   Additional Comments: Patient poor historian; will verity social history and PLOF  with family as available    Prior Function               Mobility Comments: Per chart, WC level as primary mobility; SPT between seating surfaces (intermittent use of HW) ADLs Comments: Per pt reports Ind with ADL's and requires A for putting on bras.     Extremity/Trunk Assessment   Upper Extremity Assessment Upper Extremity Assessment: RUE deficits/detail RUE Deficits / Details: R UE with reports of pain, generally weak (3-/5), elevation to shoulder height only LUE Deficits / Details: Hx of L hemiplegia prior CVA; contractures noted, maintains fisted position    Lower Extremity Assessment Lower Extremity Assessment: LLE deficits/detail;RLE deficits/detail RLE Deficits / Details: grossly 3/5 throughout LLE Deficits / Details: grossly 2-/5 throughout; L ankle lacking approx 20 degrees PF.  Increased extensor/adductor tone throughout       Communication   Communication Communication:  (globally lethargic, difficulty completing sentences; mumbled and unintelligible large portion of the time)  Cognition Arousal: Lethargic Behavior During Therapy: Flat affect Overall Cognitive Status: No family/caregiver present to determine baseline cognitive functioning                                          General Comments      Exercises     Assessment/Plan    PT Assessment Patient needs continued PT services  PT Problem List Decreased strength;Decreased activity tolerance;Decreased range of motion;Decreased balance;Decreased mobility;Decreased coordination;Decreased cognition;Decreased knowledge of use of DME;Decreased safety awareness;Decreased knowledge of precautions;Cardiopulmonary status limiting activity;Impaired sensation       PT Treatment Interventions DME instruction;Functional mobility training;Therapeutic activities;Therapeutic exercise;Balance training;Patient/family education    PT Goals (Current goals can be found in the Care Plan section)  Acute  Rehab PT Goals PT Goal Formulation: Patient unable to participate in goal setting Time For Goal Achievement: 10/17/23 Potential to Achieve Goals: Fair    Frequency Min 1X/week     Co-evaluation               AM-PAC PT "6 Clicks" Mobility  Outcome Measure Help needed turning from your back to your side while in a flat bed without using bedrails?: Total Help needed moving from lying on your back to sitting on the side of a flat bed without using bedrails?: Total Help needed moving to and from a bed to a chair (including a wheelchair)?: Total Help needed standing up from a chair using your arms (e.g., wheelchair or bedside chair)?: Total Help needed to walk in hospital room?: Total Help needed climbing 3-5 steps with a railing? : Total 6 Click Score: 6    End of Session   Activity Tolerance: Patient limited by lethargy;Patient limited by pain Patient left: in bed;with call bell/phone within reach;with bed alarm set Nurse Communication: Mobility status PT Visit Diagnosis: Muscle weakness (generalized) (M62.81);History of falling (Z91.81)    Time: 9528-4132 PT Time Calculation (min) (ACUTE ONLY): 19 min   Charges:   PT Evaluation $PT Eval Moderate Complexity: 1 Mod   PT General Charges $$ ACUTE PT VISIT: 1 Visit         Sayward Horvath H. Manson Passey, PT,  DPT, NCS 10/03/23, 11:39 AM [phone]

## 2023-10-03 NOTE — Plan of Care (Signed)
  Problem: Education: Goal: Ability to describe self-care measures that may prevent or decrease complications (Diabetes Survival Skills Education) will improve Outcome: Progressing Goal: Individualized Educational Video(s) Outcome: Progressing   Problem: Coping: Goal: Ability to adjust to condition or change in health will improve Outcome: Progressing   Problem: Fluid Volume: Goal: Ability to maintain a balanced intake and output will improve Outcome: Progressing   Problem: Health Behavior/Discharge Planning: Goal: Ability to identify and utilize available resources and services will improve Outcome: Progressing Goal: Ability to manage health-related needs will improve Outcome: Not Progressing   Problem: Metabolic: Goal: Ability to maintain appropriate glucose levels will improve Outcome: Progressing   Problem: Nutritional: Goal: Maintenance of adequate nutrition will improve Outcome: Progressing Goal: Progress toward achieving an optimal weight will improve Outcome: Progressing   Problem: Skin Integrity: Goal: Risk for impaired skin integrity will decrease Outcome: Progressing   Problem: Tissue Perfusion: Goal: Adequacy of tissue perfusion will improve Outcome: Progressing   Problem: Education: Goal: Knowledge of General Education information will improve Description: Including pain rating scale, medication(s)/side effects and non-pharmacologic comfort measures Outcome: Progressing   Problem: Health Behavior/Discharge Planning: Goal: Ability to manage health-related needs will improve Outcome: Progressing   Problem: Clinical Measurements: Goal: Ability to maintain clinical measurements within normal limits will improve Outcome: Progressing Goal: Will remain free from infection Outcome: Progressing Goal: Diagnostic test results will improve Outcome: Progressing Goal: Respiratory complications will improve Outcome: Progressing Goal: Cardiovascular complication  will be avoided Outcome: Progressing   Problem: Activity: Goal: Risk for activity intolerance will decrease Outcome: Progressing   Problem: Elimination: Goal: Will not experience complications related to bowel motility Outcome: Progressing Goal: Will not experience complications related to urinary retention Outcome: Progressing   Problem: Pain Management: Goal: General experience of comfort will improve Outcome: Progressing

## 2023-10-04 DIAGNOSIS — R531 Weakness: Secondary | ICD-10-CM | POA: Diagnosis not present

## 2023-10-04 LAB — BASIC METABOLIC PANEL
Anion gap: 7 (ref 5–15)
BUN: 11 mg/dL (ref 8–23)
CO2: 25 mmol/L (ref 22–32)
Calcium: 8 mg/dL — ABNORMAL LOW (ref 8.9–10.3)
Chloride: 106 mmol/L (ref 98–111)
Creatinine, Ser: 0.49 mg/dL (ref 0.44–1.00)
GFR, Estimated: >60 mL/min (ref >60)
Glucose, Bld: 106 mg/dL — ABNORMAL HIGH (ref 70–99)
Potassium: 4.4 mmol/L (ref 3.5–5.1)
Sodium: 138 mmol/L (ref 135–145)

## 2023-10-04 LAB — BLOOD GAS, ARTERIAL
Acid-Base Excess: 4.3 mmol/L — ABNORMAL HIGH (ref 0.0–2.0)
Bicarbonate: 31.5 mmol/L — ABNORMAL HIGH (ref 20.0–28.0)
O2 Content: 2 L/min
O2 Saturation: 98.1 %
Patient temperature: 37
pCO2 arterial: 57 mm[Hg] — ABNORMAL HIGH (ref 32–48)
pH, Arterial: 7.35 (ref 7.35–7.45)
pO2, Arterial: 81 mm[Hg] — ABNORMAL LOW (ref 83–108)

## 2023-10-04 LAB — GLUCOSE, CAPILLARY
Glucose-Capillary: 101 mg/dL — ABNORMAL HIGH (ref 70–99)
Glucose-Capillary: 127 mg/dL — ABNORMAL HIGH (ref 70–99)
Glucose-Capillary: 127 mg/dL — ABNORMAL HIGH (ref 70–99)
Glucose-Capillary: 109 mg/dL — ABNORMAL HIGH (ref 70–99)

## 2023-10-04 LAB — CBC
HCT: 37.3 % (ref 36.0–46.0)
Hemoglobin: 11.6 g/dL — ABNORMAL LOW (ref 12.0–15.0)
MCH: 30.8 pg (ref 26.0–34.0)
MCHC: 31.1 g/dL (ref 30.0–36.0)
MCV: 98.9 fL (ref 80.0–100.0)
Platelets: 150 10*3/uL (ref 150–400)
RBC: 3.77 MIL/uL — ABNORMAL LOW (ref 3.87–5.11)
RDW: 16.1 % — ABNORMAL HIGH (ref 11.5–15.5)
WBC: 6.3 10*3/uL (ref 4.0–10.5)
nRBC: 0 % (ref 0.0–0.2)

## 2023-10-04 LAB — PHOSPHORUS: Phosphorus: 3.5 mg/dL (ref 2.5–4.6)

## 2023-10-04 LAB — MAGNESIUM: Magnesium: 2.3 mg/dL (ref 1.7–2.4)

## 2023-10-04 MED ORDER — QUETIAPINE FUMARATE 25 MG PO TABS
75.0000 mg | ORAL_TABLET | Freq: Every day | ORAL | Status: DC
  Administered 2023-10-05: 75 mg via ORAL
  Filled 2023-10-04: qty 3

## 2023-10-04 MED ORDER — SMOG ENEMA
960.0000 mL | Freq: Once | RECTAL | Status: AC
  Administered 2023-10-04: 960 mL via RECTAL
  Filled 2023-10-04: qty 960

## 2023-10-04 MED ORDER — BACLOFEN 10 MG PO TABS
10.0000 mg | ORAL_TABLET | Freq: Three times a day (TID) | ORAL | Status: DC
  Administered 2023-10-05 (×2): 10 mg via ORAL
  Filled 2023-10-04 (×2): qty 1

## 2023-10-04 MED ORDER — GABAPENTIN 300 MG PO CAPS
300.0000 mg | ORAL_CAPSULE | Freq: Three times a day (TID) | ORAL | Status: DC
  Administered 2023-10-05 (×2): 300 mg via ORAL
  Filled 2023-10-04 (×2): qty 1

## 2023-10-04 MED ORDER — NALOXONE HCL 0.4 MG/ML IJ SOLN
0.4000 mg | INTRAMUSCULAR | Status: DC | PRN
  Administered 2023-10-04: 0.4 mg via INTRAVENOUS
  Filled 2023-10-04: qty 1

## 2023-10-04 NOTE — Plan of Care (Signed)
  Problem: Education: Goal: Ability to describe self-care measures that may prevent or decrease complications (Diabetes Survival Skills Education) will improve Outcome: Not Progressing   Problem: Coping: Goal: Ability to adjust to condition or change in health will improve Outcome: Not Progressing   Problem: Fluid Volume: Goal: Ability to maintain a balanced intake and output will improve Outcome: Progressing   Problem: Health Behavior/Discharge Planning: Goal: Ability to identify and utilize available resources and services will improve Outcome: Progressing Goal: Ability to manage health-related needs will improve Outcome: Progressing   Problem: Metabolic: Goal: Ability to maintain appropriate glucose levels will improve Outcome: Progressing   Problem: Nutritional: Goal: Maintenance of adequate nutrition will improve Outcome: Progressing Goal: Progress toward achieving an optimal weight will improve Outcome: Progressing   Problem: Skin Integrity: Goal: Risk for impaired skin integrity will decrease Outcome: Progressing   Problem: Tissue Perfusion: Goal: Adequacy of tissue perfusion will improve Outcome: Progressing   Problem: Education: Goal: Knowledge of General Education information will improve Description: Including pain rating scale, medication(s)/side effects and non-pharmacologic comfort measures Outcome: Progressing   Problem: Health Behavior/Discharge Planning: Goal: Ability to manage health-related needs will improve Outcome: Progressing   Problem: Clinical Measurements: Goal: Ability to maintain clinical measurements within normal limits will improve Outcome: Progressing Goal: Will remain free from infection Outcome: Progressing Goal: Diagnostic test results will improve Outcome: Progressing Goal: Respiratory complications will improve Outcome: Progressing Goal: Cardiovascular complication will be avoided Outcome: Progressing   Problem:  Activity: Goal: Risk for activity intolerance will decrease Outcome: Not Progressing   Problem: Nutrition: Goal: Adequate nutrition will be maintained Outcome: Progressing   Problem: Coping: Goal: Level of anxiety will decrease Outcome: Progressing   Problem: Elimination: Goal: Will not experience complications related to bowel motility Outcome: Progressing Goal: Will not experience complications related to urinary retention Outcome: Progressing   Problem: Safety: Goal: Ability to remain free from injury will improve Outcome: Progressing   Problem: Skin Integrity: Goal: Risk for impaired skin integrity will decrease Outcome: Progressing

## 2023-10-04 NOTE — Progress Notes (Signed)
[facility] Progress Note  Patient: Ariel Alvarez    EXB:[id number]  DOA: 10/01/2023     Date of Service: the patient was seen and examined on 10/04/2023  Chief Complaint  Patient presents with   Fall    EMS states patient had a fall from wheelchair while reaching for phone. EMS was called but refused trip to hospital. Denies LOC or hitting head. Pt does take eliquis.  Called this evening with increasing Left arm, L leg and abdominal pain. Is currently being treated for UTI. Husband reported to EMS that pt was more agitated this evening   Brief hospital course: Ariel Alvarez is a 77 y.o. Caucasian female with medical history significant for type 2 diabetes mellitus, hypothyroidism, GERD, depression, anxiety, CVA and hypothyroidism, who presented to the ER with acute onset of generalized weakness with subsequent fall from her wheelchair while reaching the phone without injuries.  She denies any presyncope or syncope.  No new paresthesias or focal muscle weakness.  She admits to urinary frequency and urgency as well as dysuria without hematuria or flank pain.  She admitted to tactile fever and chills.  She had nausea and vomiting a couple of days ago with mild abdominal pain.  She was having left arm, left leg and abdominal pain.  Per her husband she was more agitated this evening.  She was having mild neck pain.   ED Course: When the patient came to the ER, vital signs were within normal and leukocytosis 16.2 and otherwise unremarkable BMP.  CBC was normal.  UA was positive for nitrite and suspicious for UTI. EKG as reviewed by me : None Imaging: Noncontrast head CT scan and C-spine CT showed no acute intracranial abnormalities or C-spine abnormalities..   Chest abdomen pelvis CT with contrast revealed no acute abnormalities.  It showed constipation and diverticulosis without diverticulitis.   The patient was given 4 mg of IV morphine sulfate and 1 g of IV Rocephin.  She will be admitted  to a medical telemetry observation bed for further evaluation and management.  Assessment and Plan:  Acute metabolic encephalopathy ABG reviewed, compensated chronic CO2 retention  12/6 patient was lethargic could be due to polypharmacy, patient was in pain so she was given Dilaudid.  Tried 1 dose of Narcan and patient is more arousable now. RN was advised to avoid sedating medications when she is sleepy. We will skip the dose of gabapentin, baclofen and trazodone today   # Generalized weakness  - Could be due to polypharmacy Urine culture negative, DC'd antibiotics - Follow PT/OT eval for possible placement Follow TOC for placement   UTI ruled out Patient was getting antibiotics at home for UTI S/p Pyridium, d/c'd on 12/6 S/p ceftriaxone, DC'd on 12/6, urine culture negative. Monitor off antibiotics for now   History of pulmonary embolism - continue Eliquis.   Dyslipidemia - Continue statin therapy.   Hypothyroidism - continue Synthroid and  TSH 4.09 slightly elevated, recommend to follow with PCP to titrate Synthroid dose accordingly   Type 2 diabetes mellitus with peripheral neuropathy  HbA1c 6.2 well-controlled, prediabetic range - The patient will be placed on supplemental coverage with NovoLog. - continue Neurontin.   Anxiety and depression . continue Klonopin, Effexor XR and Seroquel 12/6 skip the doses today due to somnolence and lethargy  History of vasculitis - continue Plaquenil and methotrexate.   GERD without esophagitis - continue PPI therapy.  Morbid obesity BMI 48 Calorie restricted diet advised to lose body weight.  Diet: Heart healthy/carb modified diet DVT Prophylaxis: Therapeutic Anticoagulation with Eliquis    Advance goals of care discussion: Full code  Family Communication: family was not present at bedside, at the time of interview.  The pt provided permission to discuss medical plan with the family. Opportunity was given to ask  question and all questions were answered satisfactorily.  12/6 d/w patient's significant other, management plan discussed.  He does not want her to go to SNF Today she is drowsy, we will discuss DC plan tomorrow a.m.  Disposition:  Pt is from home, admitted with fall and UTI, still has risk of fall, needs PT and OT eval and possible placement to SNF, which precludes a safe discharge. Discharge to SNF TBD after PT and OT eval. 12/6 very somnolent and lethargic could be due to Dilaudid given in the morning, we will skip sedating medication today.  Narcan given today Most likely she will be stable to DC tomorrow Follow TOC for placement   Subjective: No significant events overnight, in the morning time patient was very sleepy and drowsy, could be due to Dilaudid given, RN was advised to give Narcan, patient was more arousable afterwards. We will continue to monitor and continue above management.   Physical Exam: General: NAD, lying comfortably, very somnolent and lethargic Eyes: PERRLA ENT: Oral Mucosa Clear, moist  Neck: no JVD,  Cardiovascular: S1 and S2 Present, no Murmur,  Respiratory: good respiratory effort, Bilateral Air entry equal and Decreased, no Crackles, no wheezes Abdomen: Bowel Sound present, Soft and no tenderness,  Skin: no rashes Extremities: no Pedal edema, no calf tenderness Neurologic: Residual weakness left arm, left hand contracted and stiff elbow. Gait not checked due to patient safety concerns  Vitals:   10/03/23 1113 10/03/23 1717 10/03/23 2012 10/04/23 1046  BP:  (!) 145/64 131/65 (!) 149/62  Pulse: 76 67 75 79  Resp:  18 14 18   Temp:  98.4 F (36.9 C) 97.8 F (36.6 C) 98.4 F (36.9 C)  TempSrc:  Axillary Axillary Oral  SpO2: 96% 100% 99% 97%    Intake/Output Summary (Last 24 hours) at 10/04/2023 1525 Last data filed at 10/04/2023 1047 Gross per 24 hour  Intake 180 ml  Output 200 ml  Net -20 ml   There were no vitals filed for this visit.  Data  Reviewed: I have personally reviewed and interpreted daily labs, tele strips, imagings as discussed above. I reviewed all nursing notes, pharmacy notes, vitals, pertinent old records I have discussed plan of care as described above with RN and patient/family.  CBC: Recent Labs  Lab 09/30/23 0400 10/01/23 2211 10/02/23 0350 10/04/23 0616  WBC 5.5 7.4 8.1 6.3  NEUTROABS 3.1 4.8  --   --   HGB 12.8 12.1 12.6 11.6*  HCT 40.7 39.2 40.2 37.3  MCV 98.5 99.5 99.3 98.9  PLT 195 186 184 150   Basic Metabolic Panel: Recent Labs  Lab 09/30/23 0400 10/01/23 2211 10/02/23 0350 10/04/23 0446  NA 142 140 139 138  K 3.8 3.6 3.7 4.4  CL 106 107 106 106  CO2 29 27 28 25   GLUCOSE 169* 256* 175* 106*  BUN 7* 8 8 11   CREATININE 0.60 0.66 0.67 0.49  CALCIUM 8.5* 8.2* 8.2* 8.0*  MG  --   --   --  2.3  PHOS  --   --   --  3.5    Studies: No results found.  Scheduled Meds:  apixaban  5 mg Oral BID  aspirin EC  81 mg Oral Daily   atorvastatin  40 mg Oral BH-q7a   baclofen  10 mg Oral TID   bisacodyl  10 mg Oral QHS   folic acid  1 mg Oral Daily   gabapentin  300 mg Oral TID   guaiFENesin  600 mg Oral BID   hydroxychloroquine  200 mg Oral Daily   insulin aspart  0-15 Units Subcutaneous TID WC   insulin aspart  0-5 Units Subcutaneous QHS   levothyroxine  100 mcg Oral Q0600   loratadine  10 mg Oral Daily   methotrexate  17.5 mg Oral Q Wed   pantoprazole  40 mg Oral Daily   polyethylene glycol  17 g Oral BID   QUEtiapine  75 mg Oral QHS   venlafaxine XR  37.5 mg Oral Q breakfast   Continuous Infusions:   PRN Meds: acetaminophen **OR** acetaminophen, albuterol, bisacodyl, clonazePAM, dicyclomine, HYDROmorphone (DILAUDID) injection, magnesium hydroxide, methocarbamol, ondansetron **OR** ondansetron (ZOFRAN) IV, oxyCODONE, traZODone  Time spent: 35 minutes  Author: Gillis Santa. MD [facility] 10/04/2023 3:25 PM  To reach On-call, see care teams to locate the attending and  reach out to them via [email or web address]. If 7PM-7AM, please contact night-coverage If you still have difficulty reaching the attending provider, please page the [facility] (Director on Call) for [facility] on amion for assistance.

## 2023-10-04 NOTE — Progress Notes (Signed)
Occupational Therapy Treatment Patient Details Name: Ariel Alvarez MRN: [id number] DOB: 1946-07-06 Today's Date: 10/04/2023   History of present illness Pt is a 77 y.o. Caucasian female with medical history significant for type 2 diabetes mellitus, hypothyroidism, GERD, depression, anxiety, CVA and hypothyroidism, who presented to the ER with acute onset of generalized weakness with subsequent fall from her wheelchair while reaching the phone without injuries.   OT comments  Pt very limited by lethargy and following no commands during session today; see below for details. Bed level only for safety.       If plan is discharge home, recommend the following:  Two people to help with walking and/or transfers;Two people to help with bathing/dressing/bathroom;Assist for transportation;Help with stairs or ramp for entrance;Assistance with cooking/housework   Equipment Recommendations  BSC/3in1    Recommendations for Other Services      Precautions / Restrictions Precautions Precautions: Fall Restrictions Weight Bearing Restrictions: No       Mobility Bed Mobility               General bed mobility comments: Bed mobility not tested today, as pt not even participating much in eating at bed level.    Transfers                   General transfer comment: unsafe/unable     Balance Overall balance assessment:  (bed level only today)                                         ADL either performed or assessed with clinical judgement   ADL Overall ADL's : Needs assistance/impaired Eating/Feeding: Maximal assistance;Bed level Eating/Feeding Details (indicate cue type and reason): took two sips of iced tea and one bite of pudding with hand-under-hand assistance; does not appear to recognize the pudding and limited swallow. Unsafe to continue eating at this time. No command-following.                                   General ADL Comments:  Pt dependent for all ADLs at this time, bed level.    Extremity/Trunk Assessment Upper Extremity Assessment Upper Extremity Assessment: Generalized weakness (LUE weakness from old CVA. RUE weakness (unable to hold up against gravity past 45 degrees shoulder flexion when OT tested). Pt is able to scratch her face spontaneously with R hand.)            Vision   Additional Comments: Pt wearing glasses throughout session   Perception     Praxis      Cognition Arousal: Lethargic Behavior During Therapy: Flat affect Overall Cognitive Status: No family/caregiver present to determine baseline cognitive functioning                                 General Comments: Pt with eyes opened most of the time, but intermittently closing them; needing tactile cues to open eyes again. Pt unable to follow any commands during session; OT using hand-under-hand assistance to take one bite of pudding, but pt not appearing to swallow; did swallow some iced tea. Making groaning noises intermittently.        Exercises      Shoulder Instructions       General Comments Pt on  2L O2; pulseox not reading well, but saturation reading 92%    Pertinent Vitals/ Pain       Pain Assessment Pain Assessment: PAINAD Breathing: normal Negative Vocalization: occasional moan/groan, low speech, negative/disapproving quality Facial Expression: smiling or inexpressive Body Language: relaxed Consolability: no need to console PAINAD Score: 1  Home Living                                          Prior Functioning/Environment              Frequency  Min 1X/week        Progress Toward Goals  OT Goals(current goals can now be found in the care plan section)  Progress towards OT goals: Not progressing toward goals - comment (decreased participation today)  Acute Rehab OT Goals Patient Stated Goal: None stated OT Goal Formulation: Patient unable to participate in goal  setting Time For Goal Achievement: 10/16/23 Potential to Achieve Goals: Fair ADL Goals Pt Will Perform Grooming: with supervision;sitting Pt Will Perform Upper Body Dressing: with min assist;standing;sitting Pt Will Transfer to Toilet: bedside commode;stand pivot transfer;with mod assist;with +2 assist  Plan      Co-evaluation                 AM-PAC OT "6 Clicks" Daily Activity     Outcome Measure   Help from another person eating meals?: Total Help from another person taking care of personal grooming?: Total Help from another person toileting, which includes using toliet, bedpan, or urinal?: Total Help from another person bathing (including washing, rinsing, drying)?: Total Help from another person to put on and taking off regular upper body clothing?: Total Help from another person to put on and taking off regular lower body clothing?: Total 6 Click Score: 6    End of Session    OT Visit Diagnosis: Unsteadiness on feet (R26.81);Other abnormalities of gait and mobility (R26.89);Muscle weakness (generalized) (M62.81);History of falling (Z91.81)   Activity Tolerance Patient limited by lethargy   Patient Left in bed;with call bell/phone within reach;with bed alarm set   Nurse Communication Mobility status        Time: 1610-9604 OT Time Calculation (min): 10 min  Charges: OT General Charges $OT Visit: 1 Visit OT Treatments $Self Care/Home Management : 8-22 mins  Linward Foster, MS, OTR/L   Alvester Morin 10/04/2023, 2:33 PM

## 2023-10-04 NOTE — Plan of Care (Signed)
  Problem: Education: Goal: Ability to describe self-care measures that may prevent or decrease complications (Diabetes Survival Skills Education) will improve Outcome: Progressing   Problem: Education: Goal: Individualized Educational Video(s) Outcome: Progressing   Problem: Coping: Goal: Ability to adjust to condition or change in health will improve Outcome: Progressing

## 2023-10-04 NOTE — TOC Initial Note (Addendum)
Transition of Care [facility]) - Initial/Assessment Note    Patient Details  Name: Ariel Alvarez MRN: [id number] Date of Birth: 1945-12-16  [facility] CM/SW Contact:    Chapman Fitch, RN Phone Number: 10/04/2023, 12:07 PM  Clinical Narrative:                      Patient noted to be A&O x2.  Spoke with sister Ariel Alvarez who is listed as patient's emergency contact.   She states that patient and significant other Ariel Maduro Ariel Alvarez) have been together for 40 years and he assist patient with all decisions.    VM left for Ariel Alvarez to review obs letter and SNF recs  Existing PASRR Fl2 sent for signature Bed search initiated    Update per Merry Proud with [facility] patient is active with PT and OT   Patient Goals and CMS Choice            Expected Discharge Plan and Services                                              Prior Living Arrangements/Services                       Activities of Daily Living      Permission Sought/Granted                  Emotional Assessment              Admission diagnosis:  Generalized abdominal pain [R10.84] Acute UTI [N39.0] Generalized weakness [R53.1] Fall, initial encounter [W19.XXXA] Patient Active Problem List   Diagnosis Date Noted   Generalized weakness 10/02/2023   Acute lower UTI 10/02/2023   GERD without esophagitis 10/02/2023   History of vasculitis 10/02/2023   Anxiety and depression 10/02/2023   Recurrent UTI 09/10/2023   Dysuria 09/10/2023   Sepsis due to pneumonia (HCC) 06/09/2023   Depression 06/09/2023   Type 2 diabetes mellitus with peripheral neuropathy (HCC) 06/09/2023   History of DVT (deep vein thrombosis) 06/09/2023   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) 06/09/2023   Acute hypoxemic respiratory failure due to COVID-19 (HCC) 06/08/2023   Hypoxia 12/14/2022   COPD with acute bronchitis (HCC) 12/14/2022   History of pulmonary embolism 12/14/2022    Chronic anticoagulation 12/14/2022   Bleeding gums 12/14/2022   Rheumatoid arthritis (HCC) 12/06/2022   CAP (community acquired pneumonia) 01/31/2022   Diabetes (HCC) 01/31/2022   Acute respiratory failure with hypoxia (HCC) 01/31/2022   Weakness of both lower extremities 01/31/2022   Lactic acidosis 01/31/2022   Moderate episode of recurrent major depressive disorder (HCC) 10/03/2021   Cognitive disorder 10/03/2021   Episodic mood disorder (HCC) 10/03/2021   Psychosis (HCC) 10/03/2021   Gait disturbance 12/19/2020   Rash 07/25/2020   Increased frequency of urination 07/25/2020   Closed nondisplaced fracture of proximal phalanx of left little finger with routine healing 04/07/2020   Hyperlipidemia 12/08/2019   Stroke (HCC) 12/08/2019   Ulcerated, foot, unspecified laterality, limited to breakdown of skin (HCC) 12/08/2019   Other constipation 09/28/2019   Dysphagia 09/28/2019   Pelvic pain in female 05/18/2019   Age-related osteoporosis without current pathological fracture 08/08/2018   Urinary hesitancy 10/04/2017   Routine health maintenance 08/26/2017   Candidiasis 08/11/2017   Morbid obesity with BMI of 40.0-44.9,  adult (HCC) 07/30/2017   Collagen vascular disease (HCC) 05/21/2017   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) 05/21/2017   Chronic pain 05/21/2017   Cerebrovascular 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) 05/06/2017   Preoperative clearance 05/06/2017   DVT (deep venous thrombosis) (HCC) 03/24/2017   Contracture of joint of left hand 02/17/2017   Other pulmonary embolism without acute cor pulmonale (HCC) 02/06/2017   Right pontine stroke (HCC) 02/03/2017   Vasculitis (HCC) 01/29/2017   Abnormal ANCA test 12/30/2016   Right carpal tunnel syndrome 04/06/2016   GERD (gastroesophageal reflux disease) 12/31/2014   Dyslipidemia 04/06/2014   Calculus of kidney and ureter 05/06/2012   Primary insomnia 07/06/2011   Hypothyroidism 07/06/2011    Depressive disorder 07/06/2011   Spastic hemiplegia affecting nondominant side (HCC) 03/27/2011   PCP:  Pcp, No Pharmacy:   [facility] [address] [place], [place] - [address] [place] [address] Phone: [phone] Fax: [phone]  [id number] - [place], [place] - [address] AT [facility] [address] [place] [address] Phone: [phone] Fax: [phone]     Social Determinants of Health (SDOH) Social History: SDOH Screenings   Food Insecurity: No Food Insecurity (10/02/2023)  Housing: Low Risk  (10/02/2023)  Transportation Needs: No Transportation Needs (10/02/2023)  Utilities: Not At Risk (10/02/2023)  Depression (PHQ2-9): High Risk (10/03/2021)  Financial Resource Strain: Medium Risk (10/16/2019)   Received from [facility], [facility]  Physical Activity: Inactive (10/16/2019)   Received from [facility], [facility]  Tobacco Use: Medium Risk (09/30/2023)  Health Literacy: Low Risk  (02/04/2021)   Received from [facility], [facility]   SDOH Interventions:     Readmission Risk Interventions     No data to display

## 2023-10-04 NOTE — [place] FL2 (Signed)
[place] MEDICAID FL2 LEVEL OF CARE FORM     IDENTIFICATION  Patient Name: Ariel Alvarez Birthdate: 1946-07-04 Sex: female Admission Date (Current Location): 10/01/2023  [facility] and [place] Number:  Chiropodist and Address:         Provider Number: [id number]  Attending Physician Name and Address:  Gillis Santa, MD  Relative Name and Phone Number:       Current Level of Care: Hospital Recommended Level of Care: Skilled Nursing Facility Prior Approval Number:    Date Approved/Denied:   PASRR Number: [id number]  Discharge Plan: SNF    Current Diagnoses: Patient Active Problem List   Diagnosis Date Noted   Generalized weakness 10/02/2023   Acute lower UTI 10/02/2023   GERD without esophagitis 10/02/2023   History of vasculitis 10/02/2023   Anxiety and depression 10/02/2023   Recurrent UTI 09/10/2023   Dysuria 09/10/2023   Sepsis due to pneumonia (HCC) 06/09/2023   Depression 06/09/2023   Type 2 diabetes mellitus with peripheral neuropathy (HCC) 06/09/2023   History of DVT (deep vein thrombosis) 06/09/2023   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) 06/09/2023   Acute hypoxemic respiratory failure due to COVID-19 (HCC) 06/08/2023   Hypoxia 12/14/2022   COPD with acute bronchitis (HCC) 12/14/2022   History of pulmonary embolism 12/14/2022   Chronic anticoagulation 12/14/2022   Bleeding gums 12/14/2022   Rheumatoid arthritis (HCC) 12/06/2022   CAP (community acquired pneumonia) 01/31/2022   Diabetes (HCC) 01/31/2022   Acute respiratory failure with hypoxia (HCC) 01/31/2022   Weakness of both lower extremities 01/31/2022   Lactic acidosis 01/31/2022   Moderate episode of recurrent major depressive disorder (HCC) 10/03/2021   Cognitive disorder 10/03/2021   Episodic mood disorder (HCC) 10/03/2021   Psychosis (HCC) 10/03/2021   Gait disturbance 12/19/2020   Rash 07/25/2020   Increased frequency of urination 07/25/2020   Closed  nondisplaced fracture of proximal phalanx of left little finger with routine healing 04/07/2020   Hyperlipidemia 12/08/2019   Stroke (HCC) 12/08/2019   Ulcerated, foot, unspecified laterality, limited to breakdown of skin (HCC) 12/08/2019   Other constipation 09/28/2019   Dysphagia 09/28/2019   Pelvic pain in female 05/18/2019   Age-related osteoporosis without current pathological fracture 08/08/2018   Urinary hesitancy 10/04/2017   Routine health maintenance 08/26/2017   Candidiasis 08/11/2017   Morbid obesity with BMI of 40.0-44.9, adult (HCC) 07/30/2017   Collagen vascular disease (HCC) 05/21/2017   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) 05/21/2017   Chronic pain 05/21/2017   Cerebrovascular 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) 05/06/2017   Preoperative clearance 05/06/2017   DVT (deep venous thrombosis) (HCC) 03/24/2017   Contracture of joint of left hand 02/17/2017   Other pulmonary embolism without acute cor pulmonale (HCC) 02/06/2017   Right pontine stroke (HCC) 02/03/2017   Vasculitis (HCC) 01/29/2017   Abnormal ANCA test 12/30/2016   Right carpal tunnel syndrome 04/06/2016   GERD (gastroesophageal reflux disease) 12/31/2014   Dyslipidemia 04/06/2014   Calculus of kidney and ureter 05/06/2012   Primary insomnia 07/06/2011   Hypothyroidism 07/06/2011   Depressive disorder 07/06/2011   Spastic hemiplegia affecting nondominant side (HCC) 03/27/2011    Orientation RESPIRATION BLADDER Height & Weight     Self, Place  O2 (2L [place]) Incontinent Weight:   Height:     BEHAVIORAL SYMPTOMS/MOOD NEUROLOGICAL BOWEL NUTRITION STATUS      Continent Diet (Heart Healthy)  AMBULATORY STATUS COMMUNICATION OF NEEDS Skin   Extensive  Assist Verbally Normal                       Personal Care Assistance Level of Assistance              Functional Limitations Info             SPECIAL CARE FACTORS FREQUENCY  PT (By licensed PT), OT (By  licensed OT)                    Contractures Contractures Info: Present    Additional Factors Info  Code Status, Allergies Code Status Info: Full Allergies Info: Latex, prednisone           Current Medications (10/04/2023):  This is the current hospital active medication list Current Facility-Administered Medications  Medication Dose Route Frequency Provider Last Rate Last Admin   acetaminophen (TYLENOL) tablet 650 mg  650 mg Oral Q6H PRN Mansy, Jan A, MD   650 mg at 10/03/23 1610   Or   acetaminophen (TYLENOL) suppository 650 mg  650 mg Rectal Q6H PRN Mansy, Jan A, MD       albuterol (PROVENTIL) (2.5 MG/3ML) 0.083% nebulizer solution 2.5 mg  2.5 mg Inhalation Q6H PRN Mansy, Jan A, MD       apixaban (ELIQUIS) tablet 5 mg  5 mg Oral BID Mansy, Jan A, MD   5 mg at 10/04/23 1037   aspirin EC tablet 81 mg  81 mg Oral Daily Mansy, Jan A, MD   81 mg at 10/04/23 1037   atorvastatin (LIPITOR) tablet 40 mg  40 mg Oral BH-q7a Mansy, Jan A, MD   40 mg at 10/04/23 9604   baclofen (LIORESAL) tablet 10 mg  10 mg Oral TID Mansy, Jan A, MD   10 mg at 10/04/23 1037   bisacodyl (DULCOLAX) EC tablet 10 mg  10 mg Oral QHS Gillis Santa, MD   10 mg at 10/03/23 2200   bisacodyl (DULCOLAX) suppository 10 mg  10 mg Rectal Daily PRN Gillis Santa, MD       clonazePAM Scar[facility] [place]) tablet 0.5 mg  0.5 mg Oral QHS PRN Mansy, Jan A, MD   0.5 mg at 10/03/23 2056   dicyclomine (BENTYL) capsule 10 mg  10 mg Oral BID PRN Gillis Santa, MD       folic acid (FOLVITE) tablet 1 mg  1 mg Oral Daily Mansy, Jan A, MD   1 mg at 10/04/23 1037   gabapentin (NEURONTIN) capsule 300 mg  300 mg Oral TID Mansy, Jan A, MD   300 mg at 10/04/23 1037   guaiFENesin (MUCINEX) 12 hr tablet 600 mg  600 mg Oral BID Mansy, Jan A, MD   600 mg at 10/04/23 1037   HYDROmorphone (DILAUDID) injection 1 mg  1 mg Intravenous Q4H PRN Gillis Santa, MD   1 mg at 10/04/23 1038   hydroxychloroquine (PLAQUENIL) tablet 200 mg  200 mg Oral Daily Mansy,  Jan A, MD   200 mg at 10/04/23 1038   insulin aspart (novoLOG) injection 0-15 Units  0-15 Units Subcutaneous TID WC Mansy, Jan A, MD   3 Units at 10/02/23 1710   insulin aspart (novoLOG) injection 0-5 Units  0-5 Units Subcutaneous QHS Mansy, Jan A, MD       levothyroxine (SYNTHROID) tablet 100 mcg  100 mcg Oral Q0600 Mansy, Jan A, MD   100 mcg at 10/04/23 0552   loratadine (CLARITIN) tablet 10 mg  10 mg Oral  Daily Mansy, Jan A, MD   10 mg at 10/04/23 1037   magnesium hydroxide (MILK OF MAGNESIA) suspension 30 mL  30 mL Oral Daily PRN Mansy, Jan A, MD       methocarbamol (ROBAXIN) tablet 500 mg  500 mg Oral QHS PRN Mansy, Jan A, MD   500 mg at 10/03/23 2056   methotrexate (RHEUMATREX) tablet 17.5 mg  17.5 mg Oral Q Wed Mansy, Jan A, MD   17.5 mg at 10/02/23 0958   ondansetron (ZOFRAN) tablet 4 mg  4 mg Oral Q6H PRN Mansy, Jan A, MD       Or   ondansetron [facility]) injection 4 mg  4 mg Intravenous Q6H PRN Mansy, Jan A, MD       oxyCODONE (Oxy IR/ROXICODONE) immediate release tablet 5 mg  5 mg Oral Q6H PRN Gillis Santa, MD       pantoprazole (PROTONIX) EC tablet 40 mg  40 mg Oral Daily Mansy, Jan A, MD   40 mg at 10/04/23 1037   phenazopyridine (PYRIDIUM) tablet 200 mg  200 mg Oral TID Mansy, Jan A, MD   200 mg at 10/04/23 1038   polyethylene glycol (MIRALAX / GLYCOLAX) packet 17 g  17 g Oral BID Gillis Santa, MD   17 g at 10/04/23 1038   QUEtiapine (SEROQUEL) tablet 75 mg  75 mg Oral QHS Mansy, Jan A, MD   75 mg at 10/03/23 2105   sorbitol, magnesium hydroxide, mineral oil, glycerin (SMOG) enema  960 mL Rectal Once Gillis Santa, MD       traZODone (DESYREL) tablet 25 mg  25 mg Oral QHS PRN Mansy, Jan A, MD   25 mg at 10/03/23 2105   venlafaxine XR (EFFEXOR-XR) 24 hr capsule 37.5 mg  37.5 mg Oral Q breakfast Mansy, Jan A, MD   37.5 mg at 10/04/23 1038     Discharge Medications: Please see discharge summary for a list of discharge medications.  Relevant Imaging Results:  Relevant Lab  Results:   Additional Information SS: [id number]  Chapman Fitch, RN

## 2023-10-05 DIAGNOSIS — R531 Weakness: Secondary | ICD-10-CM | POA: Diagnosis not present

## 2023-10-05 LAB — GLUCOSE, CAPILLARY
Glucose-Capillary: 127 mg/dL — ABNORMAL HIGH (ref 70–99)
Glucose-Capillary: 93 mg/dL (ref 70–99)

## 2023-10-05 MED ORDER — BISACODYL 5 MG PO TBEC: 10.0000 mg | DELAYED_RELEASE_TABLET | Freq: Every day | ORAL | 2 refills | Status: AC

## 2023-10-05 MED ORDER — ORAL CARE MOUTH RINSE: 15.0000 mL | OROMUCOSAL | Status: DC | PRN

## 2023-10-05 MED ORDER — POLYETHYLENE GLYCOL 3350 17 G PO PACK: 17.0000 g | PACK | Freq: Two times a day (BID) | ORAL | 2 refills | Status: AC

## 2023-10-05 MED ORDER — ORAL CARE MOUTH RINSE
15.0000 mL | OROMUCOSAL | Status: DC
  Administered 2023-10-05 (×2): 15 mL via OROMUCOSAL

## 2023-10-05 NOTE — TOC Progression Note (Signed)
Transition of Care [facility]) - Progression Note    Patient Details  Name: Ariel Alvarez MRN: [id number] Date of Birth: May 02, 1946  [facility] CM/SW Contact  Liliana Cline, [name] Phone Number: 10/05/2023, 12:10 PM  Clinical Narrative:    Went to bedside, patient says to call [name] about DC planning. Called and spoke to patient's significant other [name]". [name] states they do not want SNF, he feels comfortable taking patient home. He states patient is not on o2 at baseline, asked MD and RN if patient will need home o2 arranged. [name] confirms they are active with [facility] and wants to continue this, he also asked to add Aide services.  [name] states he was told patient will come home today, requests call from MD with update and requests time for pickup.        Expected Discharge Plan and Services                                               Social Determinants of Health (SDOH) Interventions SDOH Screenings   Food Insecurity: No Food Insecurity (10/02/2023)  Housing: Low Risk  (10/02/2023)  Transportation Needs: No Transportation Needs (10/02/2023)  Utilities: Not At Risk (10/02/2023)  Depression (PHQ2-9): High Risk (10/03/2021)  Financial Resource Strain: Medium Risk (10/16/2019)   Received from [facility], [facility]  Physical Activity: Inactive (10/16/2019)   Received from [facility], [facility]  Tobacco Use: Medium Risk (09/30/2023)  Health Literacy: Low Risk  (02/04/2021)   Received from [facility], [facility]    Readmission Risk Interventions     No data to display

## 2023-10-05 NOTE — Discharge Summary (Signed)
[facility] Discharge Summary   Patient: [name] XBM:[id number]  PCP: Pcp, No  Date of admission: 10/01/2023   Date of discharge:  10/05/2023     Discharge Diagnoses:  Principal Problem:   Generalized weakness Active Problems:   Acute lower UTI   Hypothyroidism   Dyslipidemia   History of pulmonary embolism   Type 2 diabetes mellitus with peripheral neuropathy (HCC)   GERD without esophagitis   History of vasculitis   Anxiety and depression   Admitted From: Home Disposition:  Home with home health services, refused SNF placement  Recommendations for Outpatient Follow-up:  PCP: In 1 week Follow up LABS/TEST:  None   Follow-up Information     PCP Follow up in 1 week(s).                 Diet recommendation: Cardiac and Carb modified diet  Activity: The patient is advised to gradually reintroduce usual activities, as tolerated  Discharge Condition: stable  Code Status: Full code   History of present illness: As per the H and P dictated on admission Hospital Course:  Ariel Alvarez is a 77 y.o. Caucasian female with medical history significant for type 2 diabetes mellitus, hypothyroidism, GERD, depression, anxiety, CVA and hypothyroidism, who presented to the ER with acute onset of generalized weakness with subsequent fall from her wheelchair while reaching the phone without injuries.  She denies any presyncope or syncope.  No new paresthesias or focal muscle weakness.  She admits to urinary frequency and urgency as well as dysuria without hematuria or flank pain.  She admitted to tactile fever and chills.  She had nausea and vomiting a couple of days ago with mild abdominal pain.  She was having left arm, left leg and abdominal pain.  Per her husband she was more agitated this evening.  She was having mild neck pain.   ED Course: When the patient came to the ER, vital signs were within normal and leukocytosis 16.2 and otherwise unremarkable BMP.  CBC was  normal.  UA was positive for nitrite and suspicious for UTI. EKG as reviewed by me : None Imaging: Noncontrast head CT scan and C-spine CT showed no acute intracranial abnormalities or C-spine abnormalities.. Chest abdomen pelvis CT with contrast revealed no acute abnormalities.  It showed constipation and diverticulosis without diverticulitis.   The patient was given 4 mg of IV morphine sulfate and 1 g of IV Rocephin.  She will be admitted to a medical telemetry observation bed for further evaluation and management.   Assessment and Plan:   # Acute metabolic encephalopathy, resolved ABG reviewed, compensated chronic CO2 retention  12/6 patient was lethargic could be due to polypharmacy, patient was in pain so she was given Dilaudid.  Tried 1 dose of Narcan and patient is more arousable now. RN was advised to avoid sedating medications when she is sleepy. Patient's husband was advised to skip the dose of gabapentin, baclofen and trazodone when patient is sleepy and drowsy.   # Generalized weakness, Could be due to polypharmacy Urine culture negative, DC'd antibiotics.  PT and OT eval done, recommend SNF placement but patient refused.  Home health will be arranged by TOC, stable to discharge. # UTI ruled out: Patient was getting antibiotics at home for UTI. S/p Pyridium, d/c'd on 12/6 S/p ceftriaxone, DC'd on 12/6, urine culture negative. Monitor off antibiotics for now # History of pulmonary embolism: continue Eliquis. # Dyslipidemia: Continue statin therapy. # Hypothyroidism: continue Synthroid and  TSH 4.09 slightly elevated, recommend to follow with PCP to titrate Synthroid dose accordingly # Type 2 diabetes mellitus with peripheral neuropathy  HbA1c 6.2 well-controlled, prediabetic range, s/p NovoLog sliding scale.  Resumed home regimen, advised to monitor CBG and continue diabetic diet. # Anxiety and depression: continue Klonopin, Effexor XR and Seroquel 12/6 skip the doses due to  somnolence and lethargy.  # History of vasculitis: continue Plaquenil and methotrexate. # GERD without esophagitis: continue PPI therapy. # Constipation: Resolved after laxatives.  Started MiraLAX twice daily and Dulcolax nightly.  # Morbid obesity BMI 48 Calorie restricted diet advised to lose body weight.  Patient was seen by physical therapy, who recommended Therapy, SNF placement but patient and her partner refused for placement.  She wanted to go home with home health services, which were arranged by TOC. On the day of the discharge the patient's vitals were stable, and no other acute medical condition were reported by patient. the patient was felt safe to be discharge at Home with Home health.  Consultants: None Procedures: None  Discharge Exam: General: Appear in no distress, no Rash; Oral Mucosa Clear, moist. Cardiovascular: S1 and S2 Present, no Murmur, Respiratory: normal respiratory effort, Bilateral Air entry present and no Crackles, no wheezes Abdomen: Bowel Sound present, Soft and no tenderness, no hernia Extremities: no Pedal edema, no calf tenderness Neurology: alert and oriented to time, place, and person affect appropriate.  There were no vitals filed for this visit. Vitals:   10/05/23 0429 10/05/23 0734  BP: (!) 165/66 135/63  Pulse: 80 87  Resp: 18 19  Temp: 98.5 F (36.9 C) 98.3 F (36.8 C)  SpO2: 92% 90%    DISCHARGE MEDICATION: Allergies as of 10/05/2023       Reactions   Latex Hives   Prednisone Nausea And Vomiting   Pt states causes N/V even when taken with food.        Medication List     STOP taking these medications    ciprofloxacin 500 MG tablet Commonly known as: CIPRO   phenazopyridine 200 MG tablet Commonly known as: PYRIDIUM       TAKE these medications    acetaminophen 325 MG tablet Commonly known as: TYLENOL Take 325-650 mg by mouth every 6 (six) hours as needed for mild pain or moderate pain.   albuterol 108 (90  Base) MCG/ACT inhaler Commonly known as: VENTOLIN HFA Inhale 2 puffs into the lungs every 6 (six) hours as needed for wheezing or shortness of breath.   apixaban 5 MG Tabs tablet Commonly known as: ELIQUIS Take 1 tablet (5 mg total) by mouth 2 (two) times daily. Hold Eliquis till 12/16/22 and if no further bleeding, resume on 12/17/22   aspirin EC 81 MG tablet Take 1 tablet (81 mg total) by mouth at bedtime. Hold today. Resume on 12/15/22 if no further bleeding   atorvastatin 40 MG tablet Commonly known as: LIPITOR Take 40 mg by mouth every morning.   baclofen 10 MG tablet Commonly known as: LIORESAL Take 1 tablet (10 mg total) by mouth 3 (three) times daily.   bisacodyl 5 MG EC tablet Commonly known as: DULCOLAX Take 2 tablets (10 mg total) by mouth at bedtime.   clonazePAM 0.5 MG tablet Commonly known as: KLONOPIN Take 0.5 mg by mouth at bedtime as needed.   clotrimazole 1 % cream Commonly known as: LOTRIMIN Apply to affected area 2 times daily What changed:  how much to take when to take this reasons to  take this   desvenlafaxine 50 MG 24 hr tablet Commonly known as: PRISTIQ Take 50 mg by mouth daily.   folic acid 1 MG tablet Commonly known as: FOLVITE Take 1 mg by mouth daily.   gabapentin 300 MG capsule Commonly known as: NEURONTIN Take 300 mg by mouth 3 (three) times daily.   hydroxychloroquine 200 MG tablet Commonly known as: PLAQUENIL Take 200 mg by mouth daily.   levothyroxine 100 MCG tablet Commonly known as: SYNTHROID Take 100 mcg by mouth daily before breakfast.   methotrexate 2.5 MG tablet Commonly known as: RHEUMATREX Take 17.5 mg by mouth every Wednesday.   nystatin powder Commonly known as: MYCOSTATIN/NYSTOP Apply 1 Application topically 3 (three) times daily.   omeprazole 20 MG capsule Commonly known as: PRILOSEC Take 20 mg by mouth every morning.   polyethylene glycol 17 g packet Commonly known as: MIRALAX / GLYCOLAX Take 17 g by  mouth 2 (two) times daily.   QUEtiapine 50 MG tablet Commonly known as: SEROQUEL Take 1.5 tablets (75 mg total) by mouth at bedtime. Dose increase       Allergies  Allergen Reactions   Latex Hives   Prednisone Nausea And Vomiting    Pt states causes N/V even when taken with food.   Discharge Instructions     Call MD for:  difficulty breathing, headache or visual disturbances   Complete by: As directed    Call MD for:  extreme fatigue   Complete by: As directed    Call MD for:  persistant dizziness or light-headedness   Complete by: As directed    Call MD for:  persistant nausea and vomiting   Complete by: As directed    Call MD for:  severe uncontrolled pain   Complete by: As directed    Call MD for:  temperature >100.4   Complete by: As directed    Diet - low sodium heart healthy   Complete by: As directed    Discharge instructions   Complete by: As directed    Follow-up with PCP in 1 week   Increase activity slowly   Complete by: As directed        The results of significant diagnostics from this hospitalization (including imaging, microbiology, ancillary and laboratory) are listed below for reference.    Significant Diagnostic Studies: CT CHEST ABDOMEN PELVIS W CONTRAST  Result Date: 10/01/2023 CLINICAL DATA:  ground level fall, chest and abdominal pain, evaluating traumatic injuries EXAM: CT CHEST, ABDOMEN, AND PELVIS WITH CONTRAST TECHNIQUE: Multidetector CT imaging of the chest, abdomen and pelvis was performed following the standard protocol during bolus administration of intravenous contrast. RADIATION DOSE REDUCTION: This exam was performed according to the departmental dose-optimization program which includes automated exposure control, adjustment of the mA and/or kV according to patient size and/or use of iterative reconstruction technique. CONTRAST:  OMNIPAQUE IOHEXOL 350 MG/ML SOLN COMPARISON:  CT abdomen pelvis 09/30/2023 FINDINGS: CHEST:  Cardiovascular: No aortic injury. The thoracic aorta is normal in caliber. The heart is normal in size. No significant pericardial effusion. Mild atherosclerotic plaque. Mediastinum/Nodes: No pneumomediastinum. No mediastinal hematoma. The esophagus is unremarkable. The thyroid is unremarkable. The central airways are patent. No mediastinal, hilar, or axillary lymphadenopathy. Lungs/Pleura: No focal consolidation. No pulmonary nodule. No pulmonary mass. No pulmonary contusion or laceration. No pneumatocele formation. No pleural effusion. No pneumothorax. No hemothorax. Musculoskeletal/Chest wall: No chest wall mass. No acute rib or sternal fracture. Please see separately dictated CT thoracolumbar spine. ABDOMEN / PELVIS: Hepatobiliary:  Not enlarged. No focal lesion. No laceration or subcapsular hematoma. Status post cholecystectomy.  No biliary ductal dilatation. Pancreas: Normal pancreatic contour. No main pancreatic duct dilatation. Spleen: Not enlarged. No focal lesion. No laceration, subcapsular hematoma, or vascular injury. Adrenals/Urinary Tract: No nodularity bilaterally. Bilateral kidneys enhance symmetrically. No hydronephrosis. No contusion, laceration, or subcapsular hematoma. No injury to the vascular structures or collecting systems. No hydroureter. The urinary bladder is unremarkable. Stomach/Bowel: No small or large bowel wall thickening or dilatation. Colonic diverticulosis. Stool throughout the colon. The appendix is not definitely identified with no inflammatory changes in the right lower quadrant to suggest acute appendicitis. Vasculature/Lymphatics: Severe atherosclerotic plaque. Venous collaterals along the lower anterior abdominal wall. No abdominal aorta or iliac aneurysm. No active contrast extravasation or pseudoaneurysm. No abdominal, pelvic, inguinal lymphadenopathy. Reproductive: Status post hysterectomy. Other: No simple free fluid ascites. No pneumoperitoneum. No hemoperitoneum. No  mesenteric hematoma identified. No organized fluid collection. Musculoskeletal: No significant soft tissue hematoma. Diastasis rectus. Tiny of the lung hernia. No acute pelvic fracture. Please see separately dictated CT thoracolumbar spine. Ports and Devices: None. IMPRESSION: 1. No acute intrathoracic, intra-abdominal, intrapelvic traumatic injury. 2. Please see separately dictated CT thoracolumbar spine 10/01/2023. 3. Other imaging findings of potential clinical significance: Colonic diverticulosis with no acute diverticulitis. Stool throughout the colon-correlate for constipation. Electronically Signed   By: Tish Frederickson M.D.   On: 10/01/2023 23:52   CT T-SPINE NO CHARGE  Result Date: 10/01/2023 CLINICAL DATA:  Ground level fall. EXAM: CT THORACIC AND LUMBAR SPINE WITHOUT CONTRAST TECHNIQUE: Multidetector CT imaging of the thoracic and lumbar spine was performed without contrast. Multiplanar CT image reconstructions were also generated. RADIATION DOSE REDUCTION: This exam was performed according to the departmental dose-optimization program which includes automated exposure control, adjustment of the mA and/or kV according to patient size and/or use of iterative reconstruction technique. COMPARISON:  None Available. FINDINGS: CT THORACIC SPINE FINDINGS Alignment: Normal. Vertebrae: Multilevel mild osteophyte formation. No acute fracture or focal pathologic process. Paraspinal and other soft tissues: Negative. Disc levels: Maintained. CT LUMBAR SPINE FINDINGS Segmentation: 5 lumbar type vertebrae. Alignment: Normal. Vertebrae: No acute fracture or focal pathologic process. Paraspinal and other soft tissues: Negative. Disc levels: Maintained peer IMPRESSION: CT THORACIC SPINE IMPRESSION No acute displaced fracture or traumatic listhesis of the thoracic spine. CT LUMBAR SPINE IMPRESSION No acute displaced fracture or traumatic listhesis of the lumbar spine. Electronically Signed   By: Tish Frederickson M.D.    On: 10/01/2023 23:30   CT L-SPINE NO CHARGE  Result Date: 10/01/2023 CLINICAL DATA:  Ground level fall. EXAM: CT THORACIC AND LUMBAR SPINE WITHOUT CONTRAST TECHNIQUE: Multidetector CT imaging of the thoracic and lumbar spine was performed without contrast. Multiplanar CT image reconstructions were also generated. RADIATION DOSE REDUCTION: This exam was performed according to the departmental dose-optimization program which includes automated exposure control, adjustment of the mA and/or kV according to patient size and/or use of iterative reconstruction technique. COMPARISON:  None Available. FINDINGS: CT THORACIC SPINE FINDINGS Alignment: Normal. Vertebrae: Multilevel mild osteophyte formation. No acute fracture or focal pathologic process. Paraspinal and other soft tissues: Negative. Disc levels: Maintained. CT LUMBAR SPINE FINDINGS Segmentation: 5 lumbar type vertebrae. Alignment: Normal. Vertebrae: No acute fracture or focal pathologic process. Paraspinal and other soft tissues: Negative. Disc levels: Maintained peer IMPRESSION: CT THORACIC SPINE IMPRESSION No acute displaced fracture or traumatic listhesis of the thoracic spine. CT LUMBAR SPINE IMPRESSION No acute displaced fracture or traumatic listhesis of the lumbar spine. Electronically Signed  By: Tish Frederickson M.D.   On: 10/01/2023 23:30   CT Head Wo Contrast  Result Date: 10/01/2023 CLINICAL DATA:  Ground-level fall, unsure head injury, prior history of CVA, evaluating for ICH; Ground-level fall, tenderness palpation to C-spine EXAM: CT HEAD WITHOUT CONTRAST CT CERVICAL SPINE WITHOUT CONTRAST TECHNIQUE: Multidetector CT imaging of the head and cervical spine was performed following the standard protocol without intravenous contrast. Multiplanar CT image reconstructions of the cervical spine were also generated. RADIATION DOSE REDUCTION: This exam was performed according to the departmental dose-optimization program which includes automated  exposure control, adjustment of the mA and/or kV according to patient size and/or use of iterative reconstruction technique. COMPARISON:  CT head 03/10/2023 FINDINGS: CT HEAD FINDINGS Brain: Cerebral ventricle sizes are concordant with the degree of cerebral volume loss. Patchy and confluent areas of decreased attenuation are noted throughout the deep and periventricular white matter of the cerebral hemispheres bilaterally, compatible with chronic microvascular ischemic disease. Similar-appearing ring right MCA territory, bilateral cerebellar, left basal ganglia encephalomalacia. No evidence of large-territorial acute infarction. No parenchymal hemorrhage. No mass lesion. No extra-axial collection. No mass effect or midline shift. No hydrocephalus. Basilar cisterns are patent. Vascular: No hyperdense vessel. Skull: No acute fracture or focal lesion. Sinuses/Orbits: Paranasal sinuses and mastoid air cells are clear. The orbits are unremarkable. Other: None. CT CERVICAL SPINE FINDINGS Alignment: Normal. Skull base and vertebrae: Multilevel degenerative changes of the spine most prominent at the C5-C7 levels. No associated severe osseous neural foraminal or central canal stenosis. No acute fracture. No aggressive appearing focal osseous lesion or focal pathologic process. Soft tissues and spinal canal: No prevertebral fluid or swelling. No visible canal hematoma. Upper chest: Unremarkable. Other: Retropharyngeal course of the carotid arteries within the neck. IMPRESSION: 1. No acute intracranial abnormality. 2. No acute displaced fracture or traumatic listhesis of the cervical spine. Electronically Signed   By: Tish Frederickson M.D.   On: 10/01/2023 23:27   CT Cervical Spine Wo Contrast  Result Date: 10/01/2023 CLINICAL DATA:  Ground-level fall, unsure head injury, prior history of CVA, evaluating for ICH; Ground-level fall, tenderness palpation to C-spine EXAM: CT HEAD WITHOUT CONTRAST CT CERVICAL SPINE WITHOUT  CONTRAST TECHNIQUE: Multidetector CT imaging of the head and cervical spine was performed following the standard protocol without intravenous contrast. Multiplanar CT image reconstructions of the cervical spine were also generated. RADIATION DOSE REDUCTION: This exam was performed according to the departmental dose-optimization program which includes automated exposure control, adjustment of the mA and/or kV according to patient size and/or use of iterative reconstruction technique. COMPARISON:  CT head 03/10/2023 FINDINGS: CT HEAD FINDINGS Brain: Cerebral ventricle sizes are concordant with the degree of cerebral volume loss. Patchy and confluent areas of decreased attenuation are noted throughout the deep and periventricular white matter of the cerebral hemispheres bilaterally, compatible with chronic microvascular ischemic disease. Similar-appearing ring right MCA territory, bilateral cerebellar, left basal ganglia encephalomalacia. No evidence of large-territorial acute infarction. No parenchymal hemorrhage. No mass lesion. No extra-axial collection. No mass effect or midline shift. No hydrocephalus. Basilar cisterns are patent. Vascular: No hyperdense vessel. Skull: No acute fracture or focal lesion. Sinuses/Orbits: Paranasal sinuses and mastoid air cells are clear. The orbits are unremarkable. Other: None. CT CERVICAL SPINE FINDINGS Alignment: Normal. Skull base and vertebrae: Multilevel degenerative changes of the spine most prominent at the C5-C7 levels. No associated severe osseous neural foraminal or central canal stenosis. No acute fracture. No aggressive appearing focal osseous lesion or focal pathologic process. Soft tissues  and spinal canal: No prevertebral fluid or swelling. No visible canal hematoma. Upper chest: Unremarkable. Other: Retropharyngeal course of the carotid arteries within the neck. IMPRESSION: 1. No acute intracranial abnormality. 2. No acute displaced fracture or traumatic listhesis  of the cervical spine. Electronically Signed   By: Tish Frederickson M.D.   On: 10/01/2023 23:27   CT ABDOMEN PELVIS W CONTRAST  Result Date: 09/30/2023 CLINICAL DATA:  Abdominal pain.  Currently under treatment for UTI. EXAM: CT ABDOMEN AND PELVIS WITH CONTRAST TECHNIQUE: Multidetector CT imaging of the abdomen and pelvis was performed using the standard protocol following bolus administration of intravenous contrast. RADIATION DOSE REDUCTION: This exam was performed according to the departmental dose-optimization program which includes automated exposure control, adjustment of the mA and/or kV according to patient size and/or use of iterative reconstruction technique. CONTRAST:  OMNIPAQUE IOHEXOL 350 MG/ML SOLN COMPARISON:  CT pelvis without contrast 07/11/2021, CT abdomen pelvis with IV contrast 02/16/2017. FINDINGS: Lower chest: Lung bases are clear with mild chronic elevation of the right hemidiaphragm. The cardiac size is normal. There are single-vessel calcifications in the LAD coronary artery. Hepatobiliary: No focal liver abnormality is seen. Status post cholecystectomy. No biliary dilatation. The liver is mildly steatotic. Pancreas: There is fatty infiltration but no focal abnormality. Spleen: No abnormality. Adrenals/Urinary Tract: Adrenal glands are unremarkable. Kidneys are normal, without renal calculi, focal lesion, or hydronephrosis. Bladder is unremarkable for the degree of distention. Stomach/Bowel: No dilatation or wall thickening. An appendix is not seen in this patient. There is mild-to-moderate fecal stasis. Colonic diverticulosis without evidence of diverticulitis. Vascular/Lymphatic: There is patchy aortoiliac atherosclerosis without aneurysm. Circumaortic left renal vein. There are abdominal wall varices emptying into the common femoral veins. No lymphadenopathy is seen. Reproductive: Status post hysterectomy. No adnexal masses. Other: Small umbilical fat hernia. No incarcerated  hernia. No free air, hemorrhage or fluid. Musculoskeletal: Osteopenia with mild degenerative changes of the spine. No new osseous abnormality. IMPRESSION: 1. No acute CT findings in the abdomen or pelvis. 2. Constipation and diverticulosis. 3. Aortic and coronary artery atherosclerosis. 4. 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 This was seen previously. 5. Umbilical fat hernia. 6. Osteopenia and degenerative change. Aortic Atherosclerosis (ICD10-I70.0). Electronically Signed   By: Almira Bar M.D.   On: 09/30/2023 05:11   US Venous Img Upper Uni Left  Result Date: 09/30/2023 CLINICAL DATA:  Left upper extremity swelling. EXAM: LEFT UPPER EXTREMITY VENOUS DOPPLER ULTRASOUND TECHNIQUE: Gray-scale sonography with graded compression, as well as color Doppler and duplex ultrasound were performed to evaluate the upper extremity deep venous system from the level of the subclavian vein and including the jugular, axillary, basilic, radial, ulnar and upper cephalic vein. Spectral Doppler was utilized to evaluate flow at rest and with distal augmentation maneuvers. COMPARISON:  December 29, 2021 FINDINGS: Contralateral Subclavian Vein: Respiratory phasicity is normal and symmetric with the symptomatic side. No evidence of thrombus. Normal compressibility. Internal Jugular Vein: No evidence of thrombus. Normal compressibility, respiratory phasicity and response to augmentation. Subclavian Vein: No evidence of thrombus. Normal compressibility, respiratory phasicity and response to augmentation. Axillary Vein: No evidence of thrombus. Normal compressibility, respiratory phasicity and response to augmentation. Cephalic Vein: No evidence of thrombus. Normal compressibility, respiratory phasicity and response to augmentation. Basilic Vein: No evidence of thrombus. Normal compressibility, respiratory phasicity and response to augmentation. Brachial Veins: No evidence of thrombus. Normal compressibility,  respiratory phasicity and response to augmentation. Radial Veins: No evidence of thrombus. Normal compressibility, respiratory phasicity and  response to augmentation. Ulnar Veins: No evidence of thrombus. Normal compressibility, respiratory phasicity and response to augmentation. Venous Reflux:  None visualized. Other Findings: Limited study secondary to left arm rigidity due to a prior stroke. IMPRESSION: No evidence of DVT within the LEFT upper extremity. Electronically Signed   By: Aram Candela M.D.   On: 09/30/2023 01:46    Microbiology: Recent Results (from the past 240 hour(s))  Urine Culture     Status: Abnormal   Collection Time: 09/28/23 10:30 AM   Specimen: Urine, Clean Catch  Result Value Ref Range Status   Specimen Description   Final    URINE, CLEAN CATCH Performed at [facility], [address], [place], [place] [address]    Special Requests   Final    NONE Performed at [facility], [address] [place], [place] [address]    Culture 40,000 COLONIES/mL KLEBSIELLA PNEUMONIAE (A)  Final   Report Status 09/30/2023 FINAL  Final   Organism ID, Bacteria KLEBSIELLA PNEUMONIAE (A)  Final      Susceptibility   Klebsiella pneumoniae - MIC*    AMPICILLIN >=32 RESISTANT Resistant     CEFAZOLIN <=4 SENSITIVE Sensitive     CEFEPIME <=0.12 SENSITIVE Sensitive     CEFTRIAXONE <=0.25 SENSITIVE Sensitive     CIPROFLOXACIN <=0.25 SENSITIVE Sensitive     GENTAMICIN <=1 SENSITIVE Sensitive     IMIPENEM <=0.25 SENSITIVE Sensitive     NITROFURANTOIN 64 INTERMEDIATE Intermediate     TRIMETH/SULFA <=20 SENSITIVE Sensitive     AMPICILLIN/SULBACTAM 4 SENSITIVE Sensitive     PIP/TAZO <=4 SENSITIVE Sensitive ug/mL    * 40,000 COLONIES/mL KLEBSIELLA PNEUMONIAE  Urine Culture     Status: None   Collection Time: 10/01/23 11:30 PM   Specimen: Urine, Random  Result Value Ref Range Status   Specimen Description   Final    URINE, RANDOM Performed at [facility], [address] [place], [place] [address]    Special Requests   Final    NONE Performed at [facility], [address] [place], [place] [address]    Culture   Final    NO GROWTH Performed at [facility], [address], [place], [place] [address]    Report Status 10/03/2023 FINAL  Final     Labs: CBC: Recent Labs  Lab 09/30/23 0400 10/01/23 2211 10/02/23 0350 10/04/23 0616  WBC 5.5 7.4 8.1 6.3  NEUTROABS 3.1 4.8  --   --   HGB 12.8 12.1 12.6 11.6*  HCT 40.7 39.2 40.2 37.3  MCV 98.5 99.5 99.3 98.9  PLT 195 186 184 150   Basic Metabolic Panel: Recent Labs  Lab 09/30/23 0400 10/01/23 2211 10/02/23 0350 10/04/23 0446  NA 142 140 139 138  K 3.8 3.6 3.7 4.4  CL 106 107 106 106  CO2 29 27 28 25   GLUCOSE 169* 256* 175* 106*  BUN 7* 8 8 11   CREATININE 0.60 0.66 0.67 0.49  CALCIUM 8.5* 8.2* 8.2* 8.0*  MG  --   --   --  2.3  PHOS  --   --   --  3.5   Liver Function Tests: Recent Labs  Lab 09/30/23 0400 10/02/23 0350  AST 33 33  ALT 23 22  ALKPHOS 77 74  BILITOT 1.0 0.9  PROT 5.6* 5.8*  ALBUMIN 3.1* 3.2*   Recent Labs  Lab 09/30/23 0400 10/02/23 0350  LIPASE 27 87*   No results for  input(s): "AMMONIA" in the last 168 hours. Cardiac Enzymes: No results for input(s): "CKTOTAL", "CKMB", "CKMBINDEX", "TROPONINI" in the last 168 hours. BNP (last 3 results) Recent Labs    12/13/22 2330  BNP 25.9   CBG: Recent Labs  Lab 10/04/23 0818 10/04/23 1152 10/04/23 1556 10/04/23 2114 10/05/23 1208  GLUCAP 101* 127* 127* 109* 127*    Time spent: 35 minutes  Signed:  Gillis Santa  [facility] 10/05/2023 1:17 PM

## 2023-10-05 NOTE — TOC Transition Note (Addendum)
Transition of Care [facility]) - CM/SW Discharge Note   Patient Details  Name: TANIKKA KALISCH MRN: [id number] Date of Birth: June 01, 1946  Transition of Care [facility]) CM/SW Contact:  Liliana Cline, [name] Phone Number: 10/05/2023, 2:42 PM   Clinical Narrative:    Boneta Lucks with [facility] and notified her of patient being discharged home today.  3:10- Notified by RN that patient needs EMS transport home. EMS paperwork completed. [facility] called for transport, patient is 4th on the list. RN notified.     Final next level of care: Home w Home Health Services Barriers to Discharge: Barriers Resolved   Patient Goals and CMS Choice CMS Medicare.gov Compare Post Acute Care list provided to:: Patient Choice offered to / list presented to : Patient, Spouse  Discharge Placement                         Discharge Plan and Services Additional resources added to the After Visit Summary for                            [facility] Arranged: PT, OT, Nurse's Aide [facility] Agency: [facility] Date [facility] Agency Contacted: 10/05/23   Representative spoke with at [facility] Agency: Laurelyn Sickle  Social Determinants of Health (SDOH) Interventions SDOH Screenings   Food Insecurity: No Food Insecurity (10/02/2023)  Housing: Low Risk  (10/02/2023)  Transportation Needs: No Transportation Needs (10/02/2023)  Utilities: Not At Risk (10/02/2023)  Depression (PHQ2-9): High Risk (10/03/2021)  Financial Resource Strain: Medium Risk (10/16/2019)   Received from [facility]  Physical Activity: Inactive (10/16/2019)   Received from [facility], [facility]  Tobacco Use: Medium Risk (09/30/2023)  Health Literacy: Low Risk  (02/04/2021)   Received from [facility]     Readmission Risk Interventions     No data to display

## 2023-10-05 NOTE — Progress Notes (Signed)
RN reports patient is off cpap at this time due to agitation and not tolerating therapy.

## 2023-10-05 NOTE — Progress Notes (Signed)
IV removed. Patient waiting on significant other to bring clothes for discharge.  Ariel Alvarez

## 2023-10-05 NOTE — Plan of Care (Signed)

## 2023-10-05 NOTE — Care Management Obs Status (Signed)
MEDICARE OBSERVATION STATUS NOTIFICATION   Patient Details  Name: Ariel Alvarez MRN: [id number] Date of Birth: 12-Mar-1946   Medicare Observation Status Notification Given:  Yes    Raiford Fetterman E Mayreli Alden, [name] 10/05/2023, 12:07 PM

## 2023-10-05 NOTE — Plan of Care (Signed)

## 2023-10-15 ENCOUNTER — Emergency Department
Admission: EM | Admit: 2023-10-15 | Discharge: 2023-10-15 | Disposition: A | Discharge status: Home / Self Care | Payer: Medicare PPO | Attending: Emergency Medicine | Admitting: Emergency Medicine

## 2023-10-15 ENCOUNTER — Other Ambulatory Visit: Payer: Self-pay

## 2023-10-15 DIAGNOSIS — R252 Cramp and spasm: Secondary | ICD-10-CM

## 2023-10-15 DIAGNOSIS — Z8673 Personal history of transient ischemic attack (TIA), and cerebral infarction without residual deficits: Secondary | ICD-10-CM | POA: Diagnosis not present

## 2023-10-15 DIAGNOSIS — I69398 Other sequelae of cerebral infarction: Secondary | ICD-10-CM | POA: Diagnosis not present

## 2023-10-15 DIAGNOSIS — M62838 Other muscle spasm: Secondary | ICD-10-CM | POA: Diagnosis present

## 2023-10-15 LAB — BASIC METABOLIC PANEL
Anion gap: 7 (ref 5–15)
BUN: 7 mg/dL — ABNORMAL LOW (ref 8–23)
CO2: 26 mmol/L (ref 22–32)
Calcium: 8 mg/dL — ABNORMAL LOW (ref 8.9–10.3)
Chloride: 107 mmol/L (ref 98–111)
Creatinine, Ser: 0.51 mg/dL (ref 0.44–1.00)
GFR, Estimated: >60 mL/min (ref >60)
Glucose, Bld: 237 mg/dL — ABNORMAL HIGH (ref 70–99)
Potassium: 3.6 mmol/L (ref 3.5–5.1)
Sodium: 140 mmol/L (ref 135–145)

## 2023-10-15 LAB — CBC WITH DIFFERENTIAL/PLATELET
Abs Immature Granulocytes: 0.02 10*3/uL (ref 0.00–0.07)
Basophils Absolute: 0.1 10*3/uL (ref 0.0–0.1)
Basophils Relative: 1 %
Eosinophils Absolute: 0.5 10*3/uL (ref 0.0–0.5)
Eosinophils Relative: 8 %
HCT: 38.3 % (ref 36.0–46.0)
Hemoglobin: 11.8 g/dL — ABNORMAL LOW (ref 12.0–15.0)
Immature Granulocytes: 0 %
Lymphocytes Relative: 18 %
Lymphs Abs: 1.1 10*3/uL (ref 0.7–4.0)
MCH: 30.9 pg (ref 26.0–34.0)
MCHC: 30.8 g/dL (ref 30.0–36.0)
MCV: 100.3 fL — ABNORMAL HIGH (ref 80.0–100.0)
Monocytes Absolute: 0.9 10*3/uL (ref 0.1–1.0)
Monocytes Relative: 14 %
Neutro Abs: 3.7 10*3/uL (ref 1.7–7.7)
Neutrophils Relative %: 59 %
Platelets: 190 10*3/uL (ref 150–400)
RBC: 3.82 MIL/uL — ABNORMAL LOW (ref 3.87–5.11)
RDW: 16.4 % — ABNORMAL HIGH (ref 11.5–15.5)
WBC: 6.2 10*3/uL (ref 4.0–10.5)
nRBC: 0 % (ref 0.0–0.2)

## 2023-10-15 MED ORDER — BACLOFEN 10 MG PO TABS
10.0000 mg | ORAL_TABLET | ORAL | Status: AC
  Administered 2023-10-15: 10 mg via ORAL
  Filled 2023-10-15: qty 1

## 2023-10-15 MED ORDER — ACETAMINOPHEN 500 MG PO TABS
1000.0000 mg | ORAL_TABLET | Freq: Once | ORAL | Status: AC
  Administered 2023-10-15: 1000 mg via ORAL
  Filled 2023-10-15: qty 2

## 2023-10-15 NOTE — Discharge Instructions (Addendum)
Your lab tests are okay today. Please follow up with your doctor for further evaluation of your symptoms.

## 2023-10-15 NOTE — ED Triage Notes (Signed)
Pt to ED via EMS from home, pt reports muscle spasms all over her body for the past 2 days. Pt is bed bound, states she has been taking tylenol and gabapentin with no relief. Pt denies other accompanying symptoms.

## 2023-10-15 NOTE — ED Provider Notes (Signed)
[facility] Provider Note    Event Date/Time   First MD Initiated Contact with Patient 10/15/23 [phone]     (approximate)   History   Chief Complaint: Spasms   HPI  Ariel Alvarez is a 77 y.o. female  with h/o CVA with residual spastic hemiplegia, DVt/PE who comes to the ED due to muscle spasms on both sides. No cp, sob, fever, vomiting, diarrhea, dysuria. No cough. Sx intermittent x 2 days.   Reviewed outside records, noting pt had contacted PCP regarding these sx yesterday, was advised to make appt          Physical Exam   Triage Vital Signs: ED Triage Vitals  Encounter Vitals Group     BP 10/15/23 0549 137/66     Systolic BP Percentile --      Diastolic BP Percentile --      Pulse Rate 10/15/23 0549 84     Resp 10/15/23 0549 19     Temp 10/15/23 0549 97.7 F (36.5 C)     Temp src --      SpO2 10/15/23 0544 94 %     Weight 10/15/23 0548 262 lb (118.8 kg)     Height 10/15/23 0548 5\' 3"  (1.6 m)     Head Circumference --      Peak Flow --      Pain Score 10/15/23 0548 5     Pain Loc --      Pain Education --      Exclude from Growth Chart --     Most recent vital signs: Vitals:   10/15/23 0544 10/15/23 0549  BP:  137/66  Pulse:  84  Resp:  19  Temp:  97.7 F (36.5 C)  SpO2: 94% 91%    General: Awake, no distress.  CV:  Good peripheral perfusion. RRR Resp:  Normal effort. ctab Abd:  No distention. Soft nt Other:  Trace BLE edema. No calf ttp or inflammatory changes. No rash.    ED Results / Procedures / Treatments   Labs (all labs ordered are listed, but only abnormal results are displayed) Labs Reviewed  CBC WITH DIFFERENTIAL/PLATELET - Abnormal; Notable for the following components:      Result Value   RBC 3.82 (*)    Hemoglobin 11.8 (*)    MCV 100.3 (*)    RDW 16.4 (*)    All other components within normal limits  BASIC METABOLIC PANEL - Abnormal; Notable for the following components:   Glucose, Bld 237 (*)     BUN 7 (*)    Calcium 8.0 (*)    All other components within normal limits     EKG    RADIOLOGY    PROCEDURES:  Procedures   MEDICATIONS ORDERED IN ED: Medications  acetaminophen (TYLENOL) tablet 1,000 mg (has no administration in time range)  baclofen (LIORESAL) tablet 10 mg (has no administration in time range)     IMPRESSION / MDM / ASSESSMENT AND PLAN / ED COURSE  I reviewed the triage vital signs and the nursing notes.  DDx: anemia, electrolyte derangement, uremia, post-stroke spasticity, viral illness, muscle strain  Patient's presentation is most consistent with acute presentation with potential threat to life or bodily function.  Pt with severe chronic comorbidities comes to ED with c/o muscle spasms, primarily in b/l trapezius area. Most likely MSK and related to chronic sequelae of CVA. Labs okay. Will give tylenol, baclofen. Stable for DC to f/u with PCP. Doubt infection,  dehydration, new CVA/ICH, ACS, PE, dissection       FINAL CLINICAL IMPRESSION(S) / ED DIAGNOSES   Final diagnoses:  Spasticity as late effect of cerebrovascular accident (CVA)     Rx / DC Orders   ED Discharge Orders     None        Note:  This document was prepared using Dragon voice recognition software and may include unintentional dictation errors.   Sharman Cheek, MD 10/15/23 [phone]

## 2023-10-15 NOTE — ED Triage Notes (Signed)
EMS brings pt in from home for c/o muscle spasms

## 2023-10-15 NOTE — ED Notes (Signed)
[facility]  CALLED  FOR  TRANSPORT  HOME 

## 2023-10-16 ENCOUNTER — Emergency Department: Payer: Medicare PPO

## 2023-10-16 ENCOUNTER — Inpatient Hospital Stay
Admission: EM | Admit: 2023-10-16 | Discharge: 2023-10-21 | DRG: 884 | Disposition: A | Discharge status: Skilled Nursing Facility | Payer: Medicare PPO | Attending: Internal Medicine | Admitting: Internal Medicine

## 2023-10-16 ENCOUNTER — Other Ambulatory Visit: Payer: Self-pay

## 2023-10-16 DIAGNOSIS — R29711 NIHSS score 11: Secondary | ICD-10-CM | POA: Diagnosis present

## 2023-10-16 DIAGNOSIS — F32A Depression, unspecified: Secondary | ICD-10-CM | POA: Diagnosis present

## 2023-10-16 DIAGNOSIS — M069 Rheumatoid arthritis, unspecified: Secondary | ICD-10-CM | POA: Diagnosis present

## 2023-10-16 DIAGNOSIS — E119 Type 2 diabetes mellitus without complications: Secondary | ICD-10-CM

## 2023-10-16 DIAGNOSIS — F05 Delirium due to known physiological condition: Secondary | ICD-10-CM | POA: Diagnosis not present

## 2023-10-16 DIAGNOSIS — Z823 Family history of stroke: Secondary | ICD-10-CM

## 2023-10-16 DIAGNOSIS — E039 Hypothyroidism, unspecified: Secondary | ICD-10-CM | POA: Diagnosis present

## 2023-10-16 DIAGNOSIS — R531 Weakness: Secondary | ICD-10-CM | POA: Diagnosis present

## 2023-10-16 DIAGNOSIS — Z811 Family history of alcohol abuse and dependence: Secondary | ICD-10-CM

## 2023-10-16 DIAGNOSIS — Z87442 Personal history of urinary calculi: Secondary | ICD-10-CM

## 2023-10-16 DIAGNOSIS — R410 Disorientation, unspecified: Secondary | ICD-10-CM | POA: Diagnosis present

## 2023-10-16 DIAGNOSIS — Z8616 Personal history of COVID-19: Secondary | ICD-10-CM

## 2023-10-16 DIAGNOSIS — I7782 Antineutrophilic cytoplasmic antibody (ANCA) vasculitis: Secondary | ICD-10-CM | POA: Diagnosis present

## 2023-10-16 DIAGNOSIS — G8110 Spastic hemiplegia affecting unspecified side: Secondary | ICD-10-CM | POA: Diagnosis present

## 2023-10-16 DIAGNOSIS — E1142 Type 2 diabetes mellitus with diabetic polyneuropathy: Secondary | ICD-10-CM | POA: Diagnosis present

## 2023-10-16 DIAGNOSIS — Z1152 Encounter for screening for COVID-19: Secondary | ICD-10-CM

## 2023-10-16 DIAGNOSIS — Z86718 Personal history of other venous thrombosis and embolism: Secondary | ICD-10-CM

## 2023-10-16 DIAGNOSIS — F0394 Unspecified dementia, unspecified severity, with anxiety: Secondary | ICD-10-CM | POA: Diagnosis present

## 2023-10-16 DIAGNOSIS — Z7901 Long term (current) use of anticoagulants: Secondary | ICD-10-CM

## 2023-10-16 DIAGNOSIS — K219 Gastro-esophageal reflux disease without esophagitis: Secondary | ICD-10-CM | POA: Diagnosis present

## 2023-10-16 DIAGNOSIS — Z79890 Hormone replacement therapy (postmenopausal): Secondary | ICD-10-CM

## 2023-10-16 DIAGNOSIS — M4802 Spinal stenosis, cervical region: Secondary | ICD-10-CM | POA: Diagnosis present

## 2023-10-16 DIAGNOSIS — F0393 Unspecified dementia, unspecified severity, with mood disturbance: Secondary | ICD-10-CM | POA: Diagnosis not present

## 2023-10-16 DIAGNOSIS — Z818 Family history of other mental and behavioral disorders: Secondary | ICD-10-CM

## 2023-10-16 DIAGNOSIS — G9340 Encephalopathy, unspecified: Secondary | ICD-10-CM | POA: Diagnosis not present

## 2023-10-16 DIAGNOSIS — R4182 Altered mental status, unspecified: Principal | ICD-10-CM

## 2023-10-16 DIAGNOSIS — Z833 Family history of diabetes mellitus: Secondary | ICD-10-CM

## 2023-10-16 DIAGNOSIS — M62838 Other muscle spasm: Secondary | ICD-10-CM | POA: Diagnosis present

## 2023-10-16 DIAGNOSIS — T50905A Adverse effect of unspecified drugs, medicaments and biological substances, initial encounter: Secondary | ICD-10-CM | POA: Diagnosis present

## 2023-10-16 DIAGNOSIS — Z87891 Personal history of nicotine dependence: Secondary | ICD-10-CM

## 2023-10-16 DIAGNOSIS — I69354 Hemiplegia and hemiparesis following cerebral infarction affecting left non-dominant side: Secondary | ICD-10-CM

## 2023-10-16 DIAGNOSIS — G929 Unspecified toxic encephalopathy: Secondary | ICD-10-CM | POA: Diagnosis present

## 2023-10-16 DIAGNOSIS — Z79899 Other long term (current) drug therapy: Secondary | ICD-10-CM

## 2023-10-16 DIAGNOSIS — Z86711 Personal history of pulmonary embolism: Secondary | ICD-10-CM | POA: Diagnosis present

## 2023-10-16 DIAGNOSIS — Z888 Allergy status to other drugs, medicaments and biological substances status: Secondary | ICD-10-CM

## 2023-10-16 DIAGNOSIS — Z91040 Latex allergy status: Secondary | ICD-10-CM

## 2023-10-16 DIAGNOSIS — Z6841 Body Mass Index (BMI) 40.0 and over, adult: Secondary | ICD-10-CM

## 2023-10-16 DIAGNOSIS — E785 Hyperlipidemia, unspecified: Secondary | ICD-10-CM | POA: Diagnosis present

## 2023-10-16 DIAGNOSIS — T50995A Adverse effect of other drugs, medicaments and biological substances, initial encounter: Secondary | ICD-10-CM | POA: Diagnosis present

## 2023-10-16 DIAGNOSIS — E66813 Obesity, class 3: Secondary | ICD-10-CM | POA: Diagnosis present

## 2023-10-16 LAB — BASIC METABOLIC PANEL
Anion gap: 6 (ref 5–15)
BUN: 10 mg/dL (ref 8–23)
CO2: 30 mmol/L (ref 22–32)
Calcium: 8.8 mg/dL — ABNORMAL LOW (ref 8.9–10.3)
Chloride: 105 mmol/L (ref 98–111)
Creatinine, Ser: 0.58 mg/dL (ref 0.44–1.00)
GFR, Estimated: >60 mL/min (ref >60)
Glucose, Bld: 114 mg/dL — ABNORMAL HIGH (ref 70–99)
Potassium: 4.2 mmol/L (ref 3.5–5.1)
Sodium: 141 mmol/L (ref 135–145)

## 2023-10-16 LAB — URINALYSIS, ROUTINE W REFLEX MICROSCOPIC
Bilirubin Urine: NEGATIVE
Glucose, UA: NEGATIVE mg/dL
Hgb urine dipstick: NEGATIVE
Ketones, ur: NEGATIVE mg/dL
Leukocytes,Ua: NEGATIVE
Nitrite: NEGATIVE
Protein, ur: NEGATIVE mg/dL
Specific Gravity, Urine: 1.015 (ref 1.005–1.030)
pH: 7 (ref 5.0–8.0)

## 2023-10-16 LAB — CBC
HCT: 40.6 % (ref 36.0–46.0)
Hemoglobin: 13 g/dL (ref 12.0–15.0)
MCH: 30.4 pg (ref 26.0–34.0)
MCHC: 32 g/dL (ref 30.0–36.0)
MCV: 95.1 fL (ref 80.0–100.0)
Platelets: 225 10*3/uL (ref 150–400)
RBC: 4.27 MIL/uL (ref 3.87–5.11)
RDW: 16.7 % — ABNORMAL HIGH (ref 11.5–15.5)
WBC: 9.1 10*3/uL (ref 4.0–10.5)
nRBC: 0 % (ref 0.0–0.2)

## 2023-10-16 LAB — HEPATIC FUNCTION PANEL
ALT: 23 U/L (ref 0–44)
AST: 28 U/L (ref 15–41)
Albumin: 3.3 g/dL — ABNORMAL LOW (ref 3.5–5.0)
Alkaline Phosphatase: 78 U/L (ref 38–126)
Bilirubin, Direct: 0.3 mg/dL — ABNORMAL HIGH (ref 0.0–0.2)
Indirect Bilirubin: 0.9 mg/dL (ref 0.3–0.9)
Total Bilirubin: 1.2 mg/dL — ABNORMAL HIGH (ref <1.2)
Total Protein: 6 g/dL — ABNORMAL LOW (ref 6.5–8.1)

## 2023-10-16 LAB — LIPASE, BLOOD: Lipase: 24 U/L (ref 11–51)

## 2023-10-16 MED ORDER — ASPIRIN 81 MG PO TBEC
81.0000 mg | DELAYED_RELEASE_TABLET | Freq: Every day | ORAL | Status: DC
  Administered 2023-10-17 – 2023-10-21 (×5): 81 mg via ORAL
  Filled 2023-10-16 (×5): qty 1

## 2023-10-16 MED ORDER — INSULIN ASPART 100 UNIT/ML IJ SOLN
0.0000 [IU] | Freq: Three times a day (TID) | INTRAMUSCULAR | Status: DC
  Administered 2023-10-17 – 2023-10-19 (×3): 2 [IU] via SUBCUTANEOUS
  Filled 2023-10-16 (×3): qty 1

## 2023-10-16 MED ORDER — LEVOTHYROXINE SODIUM 100 MCG PO TABS
100.0000 ug | ORAL_TABLET | Freq: Every day | ORAL | Status: DC
  Administered 2023-10-17 – 2023-10-21 (×5): 100 ug via ORAL
  Filled 2023-10-16: qty 1
  Filled 2023-10-16: qty 2
  Filled 2023-10-16 (×3): qty 1

## 2023-10-16 MED ORDER — ONDANSETRON HCL 4 MG PO TABS
4.0000 mg | ORAL_TABLET | Freq: Four times a day (QID) | ORAL | Status: DC | PRN
  Administered 2023-10-17: 4 mg via ORAL
  Filled 2023-10-16: qty 1

## 2023-10-16 MED ORDER — HYDROXYCHLOROQUINE SULFATE 200 MG PO TABS
200.0000 mg | ORAL_TABLET | Freq: Every day | ORAL | Status: DC
  Administered 2023-10-17 – 2023-10-21 (×5): 200 mg via ORAL
  Filled 2023-10-16 (×5): qty 1

## 2023-10-16 MED ORDER — HYDROCODONE-ACETAMINOPHEN 5-325 MG PO TABS
1.0000 | ORAL_TABLET | ORAL | Status: DC | PRN
Start: 2023-10-16 — End: 2023-10-19
  Administered 2023-10-17 – 2023-10-19 (×5): 2 via ORAL
  Filled 2023-10-16 (×5): qty 2

## 2023-10-16 MED ORDER — BACLOFEN 10 MG PO TABS
10.0000 mg | ORAL_TABLET | Freq: Three times a day (TID) | ORAL | Status: DC
  Administered 2023-10-17 (×2): 10 mg via ORAL
  Filled 2023-10-16 (×2): qty 1

## 2023-10-16 MED ORDER — METHOTREXATE SODIUM 2.5 MG PO TABS: 17.5000 mg | ORAL_TABLET | ORAL | Status: DC

## 2023-10-16 MED ORDER — PANTOPRAZOLE SODIUM 40 MG PO TBEC
40.0000 mg | DELAYED_RELEASE_TABLET | Freq: Every day | ORAL | Status: DC
  Administered 2023-10-17 – 2023-10-21 (×5): 40 mg via ORAL
  Filled 2023-10-16 (×5): qty 1

## 2023-10-16 MED ORDER — LORAZEPAM 2 MG/ML IJ SOLN: 2.0000 mg | Freq: Once | INTRAMUSCULAR | Status: DC

## 2023-10-16 MED ORDER — ACETAMINOPHEN 650 MG RE SUPP
650.0000 mg | Freq: Four times a day (QID) | RECTAL | Status: DC | PRN
Start: 2023-10-16 — End: 2023-10-21

## 2023-10-16 MED ORDER — INSULIN ASPART 100 UNIT/ML IJ SOLN: 0.0000 [IU] | Freq: Every day | INTRAMUSCULAR | Status: DC

## 2023-10-16 MED ORDER — ACETAMINOPHEN 325 MG PO TABS
650.0000 mg | ORAL_TABLET | Freq: Four times a day (QID) | ORAL | Status: DC | PRN
  Administered 2023-10-18 – 2023-10-21 (×5): 650 mg via ORAL
  Filled 2023-10-16 (×7): qty 2

## 2023-10-16 MED ORDER — ONDANSETRON HCL 4 MG/2ML IJ SOLN: 4.0000 mg | Freq: Four times a day (QID) | INTRAMUSCULAR | Status: DC | PRN

## 2023-10-16 MED ORDER — HALOPERIDOL LACTATE 5 MG/ML IJ SOLN
1.0000 mg | INTRAMUSCULAR | Status: AC | PRN
  Administered 2023-10-17 – 2023-10-18 (×3): 1 mg via INTRAVENOUS
  Filled 2023-10-16 (×3): qty 1

## 2023-10-16 MED ORDER — FOLIC ACID 1 MG PO TABS
1.0000 mg | ORAL_TABLET | Freq: Every day | ORAL | Status: DC
  Administered 2023-10-17 – 2023-10-21 (×5): 1 mg via ORAL
  Filled 2023-10-16 (×5): qty 1

## 2023-10-16 MED ORDER — LORAZEPAM 2 MG/ML IJ SOLN
2.0000 mg | Freq: Once | INTRAMUSCULAR | Status: AC
  Administered 2023-10-16: 2 mg via INTRAMUSCULAR
  Filled 2023-10-16: qty 1

## 2023-10-16 MED ORDER — APIXABAN 5 MG PO TABS
5.0000 mg | ORAL_TABLET | Freq: Two times a day (BID) | ORAL | Status: DC
  Administered 2023-10-17 – 2023-10-21 (×9): 5 mg via ORAL
  Filled 2023-10-16 (×9): qty 1

## 2023-10-16 MED ORDER — HALOPERIDOL LACTATE 5 MG/ML IJ SOLN
5.0000 mg | Freq: Once | INTRAMUSCULAR | Status: AC
  Administered 2023-10-16: 5 mg via INTRAMUSCULAR
  Filled 2023-10-16: qty 1

## 2023-10-16 MED ORDER — ATORVASTATIN CALCIUM 20 MG PO TABS
40.0000 mg | ORAL_TABLET | ORAL | Status: DC
  Administered 2023-10-17 – 2023-10-21 (×5): 40 mg via ORAL
  Filled 2023-10-16 (×5): qty 2

## 2023-10-16 NOTE — ED Triage Notes (Signed)
Pt comes via EMs from home with c/o pain all over and AMS. Pt had uti and was taking meds. Pt did finish on Saturday. Pt was seen here few days ago for lower back pain.   VSS

## 2023-10-16 NOTE — ED Notes (Signed)
Urine collected pt continues to be verbally aggressive screaming @ Presenter, broadcasting pt order for ativan IV pt declines IV screaming at RN pt noted to be lucid occasionally

## 2023-10-16 NOTE — ED Notes (Signed)
Pt continues to scream aggressively pt provided personal cell phone and spouse is contacted per pt request on cell phone bed alarm placed under pt pt repositioned for comfort

## 2023-10-16 NOTE — ED Notes (Signed)
Pt requests Programmer, multimedia in personal belonging Scientist, research (life sciences) places eyeglasses in personal belonging bag pillow provided for pt head to attempt to provide comfort pt removes 1 sock from foot and requests Presenter, broadcasting remove the other sock the other sock is removed by Presenter, broadcasting pt continues to scream post removal of sock however  noted to be calm while requesting removal of sock

## 2023-10-16 NOTE — ED Notes (Signed)
Pt incontinent of urine.  

## 2023-10-16 NOTE — H&P (Incomplete)
History and Physical    Patient: Ariel Alvarez:[id number] DOB: 1946/06/27 DOA: 10/16/2023 DOS: the patient was seen and examined on 10/16/2023 PCP: Pcp, No  Patient coming from: Home  Chief Complaint: agitation    HPI: Ariel Alvarez is a 77 y.o. female with medical history significant for anxiety, morbid obesity, collagen vascular disease, vasculitis, depression, GERD, Hx DVT on Eliquis, hypothyroidism and CVA with spastic left-sided hemiplegia, recently hospitalized from 12/3 to 10/05/23 with acute metabolic encephalopathy that was attributed to polypharmacy, ruled out for acute infection, who presents to the ED for altered mental status, agitation and muscle spasms.  She is unable to contribute to the history.  Patient was seen in the ED the day prior with muscle spasms and was prescribed baclofen and discharge.  Since arrival in the ED she has been very agitated and hitting out, requiring Haldol for sedation. ED course and data review: Vitals within normal limits. Workup including BMP, CBC, hepatic function panel and urinalysis all unremarkable. Head CT showed remote infarcts, chest x-ray nonacute and left upper extremity venous Doppler negative for DVT  Patient treated with Ativan and Haldol Hospitalist consulted for admission.  At the time of my evaluation patient very somnolent due to medication administered in the ED and unable to contribute to history.  She did ask when awoken if I could send her home back to her husband.   Past Medical History:  Diagnosis Date   Anxiety    Arthritis    RIGHT HAND   Collagen vascular disease (HCC)    Complication of anesthesia    HARD TO WAKE UP AFTER  ONE SURGERY-PT STAETS SHE WAS GIVEN TOO MUCH ANESTHESIA   Depression    Dyspnea    VERY RARE WITH EXERTION   GERD (gastroesophageal reflux disease)    History of kidney stones    H/O   Hypothyroidism    Stroke (HCC) 8119,1478   X2-LEFT HAD PARALYZED    Past Surgical History:   Procedure Laterality Date   CHOLECYSTECTOMY     KIDNEY STONE SURGERY     METATARSAL HEAD EXCISION Bilateral 04/15/2020   Procedure: METATARSAL HEAD PARTIAL EXCISION - BILATERAL;  Surgeon: Linus Galas, DPM;  Location: [facility] ORS;  Service: Podiatry;  Laterality: Bilateral;   Social History:  reports that she quit smoking about 48 years ago. Her smoking use included cigarettes. She started smoking about 69 years ago. She has a 20 pack-year smoking history. She has never used smokeless tobacco. She reports that she does not currently use alcohol. She reports that she does not use drugs.  Allergies  Allergen Reactions   Latex Hives   Prednisone Nausea And Vomiting    Pt states causes N/V even when taken with food.    Family History  Problem Relation Age of Onset   Alcohol abuse Father    Depression Father    Suicidality Father    Stroke Paternal Grandmother    Diabetes Son    Cancer Paternal Aunt     Prior to Admission medications   Medication Sig Start Date End Date Taking? Authorizing Provider  acetaminophen (TYLENOL) 325 MG tablet Take 325-650 mg by mouth every 6 (six) hours as needed for mild pain or moderate pain.    [provider]  albuterol (VENTOLIN HFA) 108 (90 Base) MCG/ACT inhaler Inhale 2 puffs into the lungs every 6 (six) hours as needed for wheezing or shortness of breath.    [provider]  apixaban (ELIQUIS) 5 MG  TABS tablet Take 1 tablet (5 mg total) by mouth 2 (two) times daily. Hold Eliquis till 12/16/22 and if no further bleeding, resume on 12/17/22 12/17/22 10/02/23  Delfino Lovett, MD  aspirin EC 81 MG tablet Take 1 tablet (81 mg total) by mouth at bedtime. Hold today. Resume on 12/15/22 if no further bleeding 12/15/22   Delfino Lovett, MD  atorvastatin (LIPITOR) 40 MG tablet Take 40 mg by mouth every morning.     [provider]  baclofen (LIORESAL) 10 MG tablet Take 1 tablet (10 mg total) by mouth 3 (three) times daily. 09/28/23   Becky Augusta, NP   bisacodyl (DULCOLAX) 5 MG EC tablet Take 2 tablets (10 mg total) by mouth at bedtime. 10/05/23 01/03/24  Gillis Santa, MD  clonazePAM (KLONOPIN) 0.5 MG tablet Take 0.5 mg by mouth at bedtime as needed.    [provider]  clotrimazole (LOTRIMIN) 1 % cream Apply to affected area 2 times daily Patient taking differently: 1 Application 2 (two) times daily as needed. Apply to affected area 2 times daily 09/13/22   Eusebio Friendly B, PA-C  desvenlafaxine (PRISTIQ) 50 MG 24 hr tablet Take 50 mg by mouth daily. 06/15/21   [provider]  folic acid (FOLVITE) 1 MG tablet Take 1 mg by mouth daily.    [provider]  gabapentin (NEURONTIN) 300 MG capsule Take 300 mg by mouth 3 (three) times daily.    [provider]  hydroxychloroquine (PLAQUENIL) 200 MG tablet Take 200 mg by mouth daily.    [provider]  levothyroxine (SYNTHROID) 100 MCG tablet Take 100 mcg by mouth daily before breakfast. 08/05/20   [provider]  methotrexate (RHEUMATREX) 2.5 MG tablet Take 17.5 mg by mouth every Wednesday.    [provider]  nystatin (MYCOSTATIN/NYSTOP) powder Apply 1 Application topically 3 (three) times daily. 12/06/22 12/06/23  [provider]  omeprazole (PRILOSEC) 20 MG capsule Take 20 mg by mouth every morning.     [provider]  polyethylene glycol (MIRALAX / GLYCOLAX) 17 g packet Take 17 g by mouth 2 (two) times daily. 10/05/23 01/03/24  Gillis Santa, MD  QUEtiapine (SEROQUEL) 50 MG tablet Take 1.5 tablets (75 mg total) by mouth at bedtime. Dose increase 10/03/21   Jomarie Longs, MD  eszopiclone (LUNESTA) 2 MG TABS tablet Take 2 mg by mouth at bedtime as needed for sleep. Take immediately before bedtime Patient not taking: Reported on 04/14/2020  09/08/20  [provider]  phentermine 30 MG capsule Take 30 mg by mouth every morning.  09/08/20  [provider]  warfarin (COUMADIN) 2.5 MG tablet Take 2.5 mg by  mouth. Patient not taking: Reported on 04/14/2020  09/08/20  [provider]    Physical Exam: Vitals:   10/16/23 1327 10/16/23 1846 10/16/23 2126 10/16/23 2248  BP:  120/64 135/79   Pulse:  86 79   Resp:  (!) 21 16   Temp:  98.1 F (36.7 C)  97.9 F (36.6 C)  TempSrc:    Axillary  SpO2:  96% 97%   Weight: 118.8 kg     Height: 5\' 3"  (1.6 m)      Physical Exam Vitals and nursing note reviewed.  Constitutional:      General: She is sleeping. She is not in acute distress.    Comments: Somnolent from meds administered.  Will awake when name is called but will fall right back to sleep  HENT:     Head: Normocephalic  and atraumatic.  Cardiovascular:     Rate and Rhythm: Normal rate and regular rhythm.     Heart sounds: Normal heart sounds.  Pulmonary:     Effort: Pulmonary effort is normal.     Breath sounds: Normal breath sounds.  Abdominal:     Palpations: Abdomen is soft.     Tenderness: There is no abdominal tenderness.  Neurological:     General: No focal deficit present.     Labs on Admission: I have personally reviewed following labs and imaging studies  CBC: Recent Labs  Lab 10/15/23 0549 10/16/23 1342  WBC 6.2 9.1  NEUTROABS 3.7  --   HGB 11.8* 13.0  HCT 38.3 40.6  MCV 100.3* 95.1  PLT 190 225   Basic Metabolic Panel: Recent Labs  Lab 10/15/23 0549 10/16/23 1342  NA 140 141  K 3.6 4.2  CL 107 105  CO2 26 30  GLUCOSE 237* 114*  BUN 7* 10  CREATININE 0.51 0.58  CALCIUM 8.0* 8.8*   GFR: Estimated Creatinine Clearance: 75.8 mL/min (by C-G formula based on SCr of 0.58 mg/dL). Liver Function Tests: Recent Labs  Lab 10/16/23 1342  AST 28  ALT 23  ALKPHOS 78  BILITOT 1.2*  PROT 6.0*  ALBUMIN 3.3*   Recent Labs  Lab 10/16/23 1342  LIPASE 24   No results for input(s): "AMMONIA" in the last 168 hours. Coagulation Profile: No results for input(s): "INR", "PROTIME" in the last 168 hours. Cardiac Enzymes: No results for input(s):  "CKTOTAL", "CKMB", "CKMBINDEX", "TROPONINI" in the last 168 hours. BNP (last 3 results) No results for input(s): "PROBNP" in the last 8760 hours. HbA1C: No results for input(s): "HGBA1C" in the last 72 hours. CBG: No results for input(s): "GLUCAP" in the last 168 hours. Lipid Profile: No results for input(s): "CHOL", "HDL", "LDLCALC", "TRIG", "CHOLHDL", "LDLDIRECT" in the last 72 hours. Thyroid Function Tests: No results for input(s): "TSH", "T4TOTAL", "FREET4", "T3FREE", "THYROIDAB" in the last 72 hours. Anemia Panel: No results for input(s): "VITAMINB12", "FOLATE", "FERRITIN", "TIBC", "IRON", "RETICCTPCT" in the last 72 hours. Urine analysis:    Component Value Date/Time   COLORURINE YELLOW (A) 10/16/2023 2035   APPEARANCEUR HAZY (A) 10/16/2023 2035   LABSPEC 1.015 10/16/2023 2035   PHURINE 7.0 10/16/2023 2035   GLUCOSEU NEGATIVE 10/16/2023 2035   HGBUR NEGATIVE 10/16/2023 2035   BILIRUBINUR NEGATIVE 10/16/2023 2035   KETONESUR NEGATIVE 10/16/2023 2035   PROTEINUR NEGATIVE 10/16/2023 2035   NITRITE NEGATIVE 10/16/2023 2035   LEUKOCYTESUR NEGATIVE 10/16/2023 2035    Radiological Exams on Admission: US Venous Img Upper Left (DVT Study) Result Date: 10/16/2023 CLINICAL DATA:  Left upper extremity pain and edema. Evaluate for DVT. EXAM: LEFT UPPER EXTREMITY VENOUS DOPPLER ULTRASOUND TECHNIQUE: Gray-scale sonography with graded compression, as well as color Doppler and duplex ultrasound were performed to evaluate the upper extremity deep venous system from the level of the subclavian vein and including the jugular, axillary, basilic, radial, ulnar and upper cephalic vein. Spectral Doppler was utilized to evaluate flow at rest and with distal augmentation maneuvers. COMPARISON:  Left upper extremity venous Doppler ultrasound-09/30/2023 (negative); 12/29/2021 (negative). FINDINGS: Contralateral Subclavian Vein: Respiratory phasicity is normal and symmetric with the symptomatic side. No  evidence of thrombus. Normal compressibility. Internal Jugular Vein: No evidence of thrombus. Normal compressibility, respiratory phasicity and response to augmentation. Subclavian Vein: No evidence of thrombus. Normal compressibility, respiratory phasicity and response to augmentation. Axillary Vein: No evidence of thrombus. Normal compressibility, respiratory phasicity and response to augmentation.  Cephalic Vein: No evidence of thrombus. Normal compressibility, respiratory phasicity and response to augmentation. Basilic Vein: No evidence of thrombus. Normal compressibility, respiratory phasicity and response to augmentation. Brachial Veins: No evidence of thrombus. Normal compressibility, respiratory phasicity and response to augmentation. Radial Veins: No evidence of thrombus. Normal compressibility, respiratory phasicity and response to augmentation. Ulnar Veins: No evidence of thrombus. Normal compressibility, respiratory phasicity and response to augmentation. Other Findings:  None visualized. IMPRESSION: No evidence of DVT within the left upper extremity, similar to the 09/30/2023 and the 12/29/2021 examinations. Electronically Signed   By: Simonne Come M.D.   On: 10/16/2023 17:09   DG Chest 2 View Result Date: 10/16/2023 CLINICAL DATA:  Altered mental status EXAM: CHEST - 2 VIEW COMPARISON:  CT chest 10/01/2023 and chest radiograph 06/08/2023 FINDINGS: The patient is rotated to the right on today's radiograph, reducing diagnostic sensitivity and specificity. The lungs appear clear. No blunting of the costophrenic angle. Heart size within normal limits for the AP technique utilized on the frontal radiograph. No significant bony findings. The patient was presumably unable to raise her arm for the lateral view. IMPRESSION: 1. No acute findings. Electronically Signed   By: Gaylyn Rong M.D.   On: 10/16/2023 15:31   CT HEAD WO CONTRAST ( ) Result Date: 10/16/2023 CLINICAL DATA:  ams EXAM: CT HEAD  WITHOUT CONTRAST TECHNIQUE: Contiguous axial images were obtained from the base of the skull through the vertex without intravenous contrast. RADIATION DOSE REDUCTION: This exam was performed according to the departmental dose-optimization program which includes automated exposure control, adjustment of the mA and/or kV according to patient size and/or use of iterative reconstruction technique. COMPARISON:  CT head 10/01/2023. FINDINGS: Motion limited study.  Within this limitation: Brain: Remote right MCA territory infarct. Remote cerebellar infarcts. No evidence of acute large vascular territory infarct, acute hemorrhage, mass lesion, midline shift or hydrocephalus. Cerebral atrophy. Vascular: No hyperdense vessel. Skull: No acute fracture. Sinuses/Orbits: Clear sinuses.  No acute orbital findings. Other: No mastoid effusions. IMPRESSION: Motion limited study without evidence of acute intracranial abnormality. Remote infarcts. Electronically Signed   By: Feliberto Harts M.D.   On: 10/16/2023 14:35     Data Reviewed: Relevant notes from primary care and specialist visits, past discharge summaries as available in EHR, including Care Everywhere. Prior diagnostic testing as pertinent to current admission diagnoses Updated medications and problem lists for reconciliation ED course, including vitals, labs, imaging, treatment and response to treatment Triage notes, nursing and pharmacy notes and ED provider's notes Notable results as noted in HPI   Assessment and Plan: * Acute encephalopathy Possible medication side effects versus withdrawal Possible underlying dementia/chronic encephalopathy History of treatment for some months related to polypharmacy treated with Narcan earlier this month 12/3.  Prescribed baclofen in the ED on 12/17 (Patient previously on baclofen, Pristiq, Seroquel, Klonopin, gabapentin--med list not current) Acute infection not suspected but will get COVID swab.  Had COVID in  August. Haldol, Ativan as needed Will get sed rate UDS added on Can consider MRI once more settled Will get neurology consult Might benefit from psych eval once medically cleared   Obesity, Class III, BMI 40-49.9 (morbid obesity) (HCC) Complicating factor to overall prognosis and care   Dyslipidemia statin   Hypothyroidism Synthroid.   Collagen vascular disease (HCC) Plaquenil, folic acid.   Depression/Anxiety Effexor XR. Holding Klonopin  Continue trazodone prn insomnia    Type 2 diabetes mellitus with peripheral neuropathy (HCC) Sliding scale insulin coverage Holding Neurontin.   History  of DVT (deep vein thrombosis) history of PE. Continue Eliquis.   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) Continue baclofen.     DVT prophylaxis: eliquis  Consults: neurology  Advance Care Planning:   Code Status: Prior   Family Communication: none  Disposition Plan: Back to previous home environment  Severity of Illness: The appropriate patient status for this patient is OBSERVATION. Observation status is judged to be reasonable and necessary in order to provide the required intensity of service to ensure the patient's safety. The patient's presenting symptoms, physical exam findings, and initial radiographic and laboratory data in the context of their medical condition is felt to place them at decreased risk for further clinical deterioration. Furthermore, it is anticipated that the patient will be medically stable for discharge from the hospital within 2 midnights of admission.   Author: Andris Baumann, MD 10/16/2023 11:30 PM  For on call review [email or web address].

## 2023-10-16 NOTE — ED Notes (Signed)
Pure wick placed per ED physician vo pt calm during placement then continues verbal aggression and screaming

## 2023-10-16 NOTE — ED Notes (Signed)
Pt aggressively screaming @ RN RN attempts to removed saturated brief RN unable to remove pt declines to move body to the side

## 2023-10-16 NOTE — ED Provider Notes (Signed)
[facility] Provider Note    Event Date/Time   First MD Initiated Contact with Patient 10/16/23 1903     (approximate)   History   AMS   HPI  Ariel Alvarez is a 77 y.o. female presents to the emergency department today because of concerns for altered mental status and pain.  Unfortunately the patient is unable to give any significant history.  Per chart review she was seen in the ED yesterday for muscle spasms.  Was prescribed baclofen.     Physical Exam   Triage Vital Signs: ED Triage Vitals  Encounter Vitals Group     BP 10/16/23 1325 (!) 153/76     Systolic BP Percentile --      Diastolic BP Percentile --      Pulse Rate 10/16/23 1325 82     Resp 10/16/23 1325 18     Temp 10/16/23 1325 97.8 F (36.6 C)     Temp Source 10/16/23 1325 Oral     SpO2 10/16/23 1325 90 %     Weight 10/16/23 1326 262 lb (118.8 kg)     Height 10/16/23 1326 5\' 4"  (1.626 m)     Head Circumference --      Peak Flow --      Pain Score 10/16/23 1327 10     Pain Loc --      Pain Education --      Exclude from Growth Chart --     Most recent vital signs: Vitals:   10/16/23 1325 10/16/23 1846  BP: (!) 153/76 120/64  Pulse: 82 86  Resp: 18 (!) 21  Temp: 97.8 F (36.6 C) 98.1 F (36.7 C)  SpO2: 90% 96%   General: Awake, alert, not oriented. CV:  Good peripheral perfusion. Regular rate and rhythm. Resp:  Normal effort. Lungs clear. Abd:  No distention. Non tender.   ED Results / Procedures / Treatments   Labs (all labs ordered are listed, but only abnormal results are displayed) Labs Reviewed  BASIC METABOLIC PANEL - Abnormal; Notable for the following components:      Result Value   Glucose, Bld 114 (*)    Calcium 8.8 (*)    All other components within normal limits  CBC - Abnormal; Notable for the following components:   RDW 16.7 (*)    All other components within normal limits  URINALYSIS, ROUTINE W REFLEX MICROSCOPIC - Abnormal; Notable for the  following components:   Color, Urine YELLOW (*)    APPearance HAZY (*)    All other components within normal limits  HEPATIC FUNCTION PANEL - Abnormal; Notable for the following components:   Total Protein 6.0 (*)    Albumin 3.3 (*)    Total Bilirubin 1.2 (*)    Bilirubin, Direct 0.3 (*)    All other components within normal limits  URINE CULTURE  LIPASE, BLOOD      RADIOLOGY I independently interpreted and visualized the US venous Upper left. My interpretation: No clot Radiology interpretation:  IMPRESSION:  No evidence of DVT within the left upper extremity, similar to the  09/30/2023 and the 12/29/2021 examinations.    I independently interpreted and visualized the CXR. My interpretation: No pneumonia Radiology interpretation:  IMPRESSION:  1. No acute findings.    I independently interpreted and visualized the CT head. My interpretation: Old strokes Radiology interpretation:  IMPRESSION:  Motion limited study without evidence of acute intracranial  abnormality. Remote infarcts.      PROCEDURES:  Critical Care performed: No   MEDICATIONS ORDERED IN ED: Medications - No data to display   IMPRESSION / MDM / ASSESSMENT AND PLAN / ED COURSE  I reviewed the triage vital signs and the nursing notes.                              Differential diagnosis includes, but is not limited to, infection, ICH, anemia, electrolyte abnormality  Patient's presentation is most consistent with acute presentation with potential threat to life or bodily function.   Patient presented to the emergency department today because of concerns for altered mental status and body aches.  On exam patient is quite altered and cannot give any significant history.  CT head ordered from triage without acute abnormality.  Blood work without concerning leukocytosis or obvious electrolyte abnormality.  Will obtain a urine.  Do wonder if symptoms could be related to baclofen.  UA without findings  concerning for infection.  At this time somewhat unclear etiology of the patient's symptoms she was given medications to help with the medical exam and it did seem to help her behavior some.  However given continued altered mental status do think patient would benefit from further management and evaluation. Discussed with Dr. Para March with the hospitalist service.       FINAL CLINICAL IMPRESSION(S) / ED DIAGNOSES   Final diagnoses:  Altered mental status, unspecified altered mental status type      Note:  This document was prepared using Dragon voice recognition software and may include unintentional dictation errors.    Phineas Semen, MD 10/16/23 [phone]

## 2023-10-16 NOTE — ED Notes (Signed)
Pt conitnues to move on strecther and continues to Nutritional therapist attempts to assist pt pt request Presenter, broadcasting moves left arm Presenter, broadcasting attempts to move left arm and pt screams @ Presenter, broadcasting to stop moving left arm Presenter, broadcasting stops  moving left arm

## 2023-10-16 NOTE — ED Provider Triage Note (Signed)
Emergency Medicine Provider Triage Evaluation Note  Ariel Alvarez , a 77 y.o. female  was evaluated in triage.  Pt complains of spasms in her back. Brought in from EMS. Patient unsure why she is here.  Review of Systems  Positive: Back pain Negative: fever  Physical Exam  There were no vitals taken for this visit. Gen:   Awake, moaning  Resp:  Normal effort  MSK:   Moves extremities without difficulty  Other:    Medical Decision Making  Medically screening exam initiated at 1:23 PM.  Appropriate orders placed.  SAMIR LEYMAN was informed that the remainder of the evaluation will be completed by another provider, this initial triage assessment does not replace that evaluation, and the importance of remaining in the ED until their evaluation is complete.     Jackelyn Hoehn, PA-C 10/16/23 1327

## 2023-10-16 NOTE — ED Notes (Signed)
Pt repositioned in stretcher attempts to provide comfort pt screams @ Presenter, broadcasting pt continues to be verbally aggressive

## 2023-10-16 NOTE — ED Notes (Signed)
Pt repositioned for comfort pt continues to scream requesting to "get out of the bed" pt advise will notify ed physician

## 2023-10-16 NOTE — ED Notes (Signed)
Pt continues to be verbally and physically aggressive towards Presenter, broadcasting raising fist toward Multimedia programmer repositions pt with pillow under knees to attempt to provide comfort pt continues to use obscenities with Presenter, broadcasting and calling RN obscene names

## 2023-10-16 NOTE — Assessment & Plan Note (Addendum)
Possible medication side effects versus withdrawal Possible underlying dementia/chronic encephalopathy History of treatment for some months related to polypharmacy treated with Narcan earlier this month 12/3.  Prescribed baclofen in the ED on 12/17 (Patient previously on baclofen, Pristiq, Seroquel, Klonopin, gabapentin--med list not current) Acute infection not suspected but will get COVID swab.  Had COVID in August. Haldol, Ativan as needed Will get sed rate UDS added on Can consider MRI once more settled Will get neurology consult Might benefit from psych eval once medically cleared

## 2023-10-16 NOTE — ED Notes (Signed)
Pt repositioned for comfort pt removes pure wick pt remains verbally aggressive toward staff

## 2023-10-16 NOTE — ED Notes (Signed)
Pt refuses catheter pt brief changed linen changed chux pad beneath pt to continue to attempt to assist pt in remaining less moist pt is verbally aggressive raising fist toward RN

## 2023-10-17 ENCOUNTER — Ambulatory Visit: Payer: Medicare PPO

## 2023-10-17 ENCOUNTER — Observation Stay: Payer: Medicare PPO

## 2023-10-17 DIAGNOSIS — R4182 Altered mental status, unspecified: Secondary | ICD-10-CM | POA: Diagnosis not present

## 2023-10-17 DIAGNOSIS — G9340 Encephalopathy, unspecified: Secondary | ICD-10-CM | POA: Diagnosis not present

## 2023-10-17 DIAGNOSIS — R569 Unspecified convulsions: Secondary | ICD-10-CM

## 2023-10-17 LAB — HIV ANTIBODY (ROUTINE TESTING W REFLEX): HIV Screen 4th Generation wRfx: NONREACTIVE

## 2023-10-17 LAB — RESP PANEL BY RT-PCR (RSV, FLU A&B, COVID)  RVPGX2
Influenza A by PCR: NEGATIVE
Influenza B by PCR: NEGATIVE
Resp Syncytial Virus by PCR: NEGATIVE
SARS Coronavirus 2 by RT PCR: NEGATIVE

## 2023-10-17 LAB — CBG MONITORING, ED
Glucose-Capillary: 104 mg/dL — ABNORMAL HIGH (ref 70–99)
Glucose-Capillary: 121 mg/dL — ABNORMAL HIGH (ref 70–99)
Glucose-Capillary: 134 mg/dL — ABNORMAL HIGH (ref 70–99)

## 2023-10-17 LAB — GLUCOSE, CAPILLARY
Glucose-Capillary: 106 mg/dL — ABNORMAL HIGH (ref 70–99)
Glucose-Capillary: 112 mg/dL — ABNORMAL HIGH (ref 70–99)

## 2023-10-17 LAB — SEDIMENTATION RATE: Sed Rate: 4 mm/h (ref 0–30)

## 2023-10-17 LAB — AMMONIA: Ammonia: <10 umol/L (ref 9–35)

## 2023-10-17 LAB — VITAMIN B12: Vitamin B-12: 1038 pg/mL — ABNORMAL HIGH (ref 180–914)

## 2023-10-17 MED ORDER — BACLOFEN 10 MG PO TABS
5.0000 mg | ORAL_TABLET | Freq: Three times a day (TID) | ORAL | Status: DC
  Administered 2023-10-17 – 2023-10-18 (×2): 5 mg via ORAL
  Filled 2023-10-17 (×2): qty 1

## 2023-10-17 MED ORDER — GADOBUTROL 1 MMOL/ML IV SOLN
10.0000 mL | Freq: Once | INTRAVENOUS | Status: AC | PRN
  Administered 2023-10-17: 10 mL via INTRAVENOUS

## 2023-10-17 NOTE — Procedures (Signed)
Pt has been in mri since shortly after noon. Still in mri. Will attempt eeg when she returns.

## 2023-10-17 NOTE — ED Notes (Signed)
Pt in brief for urine incontinence pt repositioned for comfort pillows placed under flaccid left arm and under ankles for comfort pt reports comfort pt swallowing medicine tablets and apple juice without apparent difficulty pt oriented to self place and situation and intermittently screams @ staff reminded to attempt using civility with staff providing pt courtesy comfort and care

## 2023-10-17 NOTE — Assessment & Plan Note (Signed)
Complicating factor to overall prognosis and care 

## 2023-10-17 NOTE — Progress Notes (Signed)
Eeg done 

## 2023-10-17 NOTE — ED Notes (Signed)
Pt appears to be resting quietly provided blanket for comfort pt repositioned for comfort states "thank you for being kind to me" writer RN continues to assist pt

## 2023-10-17 NOTE — Consult Note (Signed)
NEUROLOGY CONSULT NOTE   Date of service: October 17, 2023 Patient Name: Ariel Alvarez MRN:  [id number] DOB:  1946/05/19 Chief Complaint: "Spasms" per patient, altered mental status per chart review Requesting Provider: Lucile Shutters, MD  History of Present Illness  Ariel Alvarez is a 77 y.o. 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  She contacted her PCP on 12/17 for refill of Klonopin.  She also presented on 12/17 to the ED with muscle spasms for which baclofen was added.  Additionally a phone note from PCP on 12/17 notes that the husband was expressing significant caregiver stress with increasing erratic behavior at home over the past 5 or 6 days at that time, with the patient calling 911 multiple times and ending up with an altercation in with the sheriff who called her a drug addict and wrote her a citation for which she will need to appear in court in January.  PCP was concerned about potential contribution of her ANCA vasculitis to her symptoms and there was consideration for plan for direct admission after evaluation in clinic  Here the patient did receive 5 mg of Haldol at 2049 as well as 2 mg of Ativan at 2048, 2 tablets of 5-325 hydrocodone-acetaminophen at 1:20 AM and 10:13 AM  In this setting she is quite somnolent on my evaluation  Per husband, since COVID-19 pandemic when she became housebound, she had a change in personality; when adult day care center opened back up, she was no longer able to go the center due to  her behavior (mean/agitated) -- he estimates this was about 1.5 years ago Not mean to everyone (nice to EMS typically), but has episodes of getting very upset  Also has increasing episodes of confusion, for example couldn't name president of [place] to EMS yesterday or name how many quarters in a dollar  She takes Klonopin as needed, when very agitated or for sleep.  Likely taking it nightly at this time although husband does not give a clear answer as to how often she is taking it  He initially reported that she is not taking any new medications as well, but when I asked about the baclofen specifically he did state that that is a new medication  NIHSS components Score: Comment  1a Level of Conscious 0[]  1[x]  2[]  3[]      1b LOC Questions 0[x]  1[]  2[]       1c LOC Commands 0[x]  1[]  2[]       2 Best Gaze 0[x]  1[]  2[]       3 Visual 0[x]  1[]  2[]  3[]      4 Facial Palsy 0[x]  1[]  2[]  3[]      5a Motor Arm - left 0[]  1[]  2[]  3[x]  4[]  UN[]    5b Motor Arm - Right 0[]  1[x]  2[]  3[]  4[]  UN[]    6a Motor Leg - Left 0[]  1[]  2[]  3[x]  4[]  UN[]    6b Motor Leg - Right 0[]  1[x]  2[]  3[]  4[]  UN[]    7 Limb Ataxia 0[x]  1[]  2[]  3[]  UN[]     8 Sensory 0[x]  1[]  2[]  UN[]      9 Best Language 0[]   1[x]  2[]  3[]    Too sleepy to reliably assess  10 Dysarthria 0[]  1[x]  2[]  UN[]      11 Extinct. and Inattention 0[x]  1[]  2[]       TOTAL: 11       ROS  Unable to assess due to somnolence  Past History   Past Medical History:  Diagnosis Date   Anxiety    Arthritis    RIGHT HAND   Collagen vascular disease (HCC)    Complication of anesthesia    HARD TO WAKE UP AFTER  ONE SURGERY-PT STAETS SHE WAS GIVEN TOO MUCH ANESTHESIA   Depression    Dyspnea    VERY RARE WITH EXERTION   GERD (gastroesophageal reflux disease)    History of kidney stones    H/O   Hypothyroidism    Stroke (HCC) 7829,5621   X2-LEFT HAD PARALYZED     Past Surgical History:  Procedure Laterality Date   CHOLECYSTECTOMY     KIDNEY STONE SURGERY      METATARSAL HEAD EXCISION Bilateral 04/15/2020   Procedure: METATARSAL HEAD PARTIAL EXCISION - BILATERAL;  Surgeon: Linus Galas, DPM;  Location: [facility] ORS;  Service: Podiatry;  Laterality: Bilateral;    Family History: Family History  Problem Relation Age of Onset   Alcohol abuse Father    Depression Father    Suicidality Father    Stroke Paternal Grandmother    Diabetes Son    Cancer Paternal Aunt     Social History  reports that she quit smoking about 49 years ago. Her smoking use included cigarettes. She started smoking about 69 years ago. She has a 20 pack-year smoking history. She has never used smokeless tobacco. She reports that she does not currently use alcohol. She reports that she does not use drugs.  Allergies  Allergen Reactions   Latex Hives   Prednisone Nausea And Vomiting    Pt states causes N/V even when taken with food.    Medications   Current Facility-Administered Medications:    acetaminophen (TYLENOL) tablet 650 mg, 650 mg, Oral, Q6H PRN **OR** acetaminophen (TYLENOL) suppository 650 mg, 650 mg, Rectal, Q6H PRN, Andris Baumann, MD   apixaban Everlene Balls) tablet 5 mg, 5 mg, Oral, BID, Lindajo Royal V, MD, 5 mg at 10/17/23 1008   aspirin EC tablet 81 mg, 81 mg, Oral, Daily, Lindajo Royal V, MD, 81 mg at 10/17/23 1008   atorvastatin (LIPITOR) tablet 40 mg, 40 mg, Oral, BH-q7a, Lindajo Royal V, MD, 40 mg at 10/17/23 0612   baclofen (LIORESAL) tablet 10 mg, 10 mg, Oral, TID, Andris Baumann, MD, 10 mg at 10/17/23 1008   folic acid (FOLVITE) tablet 1 mg, 1 mg, Oral, Daily, Lindajo Royal V, MD, 1 mg at 10/17/23 1008   haloperidol lactate (HALDOL) injection 1 mg, 1 mg, Intravenous, Q2H PRN, Andris Baumann, MD   HYDROcodone-acetaminophen (NORCO/VICODIN) 5-325 MG per tablet 1-2 tablet, 1-2 tablet, Oral, Q4H PRN, Andris Baumann, MD, 2 tablet at 10/17/23 1013   hydroxychloroquine (PLAQUENIL) tablet 200 mg, 200 mg, Oral, Daily, Lindajo Royal V, MD, 200 mg at 10/17/23 1007    insulin aspart (novoLOG) injection 0-15 Units, 0-15 Units, Subcutaneous, TID WC, Duncan, Hazel V, MD   insulin aspart (novoLOG) injection 0-5 Units, 0-5 Units, Subcutaneous, QHS, Andris Baumann, MD   levothyroxine (SYNTHROID) tablet 100 mcg, 100 mcg, Oral, Q0600, Andris Baumann, MD, 100 mcg at 10/17/23 0537   [START ON 10/23/2023] methotrexate (RHEUMATREX) tablet 17.5 mg, 17.5 mg, Oral, Q Wed,  Andris Baumann, MD   ondansetron [facility]) tablet 4 mg, 4 mg, Oral, Q6H PRN, 4 mg at 10/17/23 0119 **OR** ondansetron (ZOFRAN) injection 4 mg, 4 mg, Intravenous, Q6H PRN, Andris Baumann, MD   pantoprazole (PROTONIX) EC tablet 40 mg, 40 mg, Oral, Daily, Andris Baumann, MD, 40 mg at 10/17/23 1008  Current Outpatient Medications:    acetaminophen (TYLENOL) 325 MG tablet, Take 325-650 mg by mouth every 6 (six) hours as needed for mild pain or moderate pain., Disp: , Rfl:    albuterol (VENTOLIN HFA) 108 (90 Base) MCG/ACT inhaler, Inhale 2 puffs into the lungs every 6 (six) hours as needed for wheezing or shortness of breath., Disp: , Rfl:    aspirin EC 81 MG tablet, Take 1 tablet (81 mg total) by mouth at bedtime. Hold today. Resume on 12/15/22 if no further bleeding, Disp: 30 tablet, Rfl: 0   atorvastatin (LIPITOR) 40 MG tablet, Take 40 mg by mouth every morning. , Disp: , Rfl:    baclofen (LIORESAL) 10 MG tablet, Take 1 tablet (10 mg total) by mouth 3 (three) times daily., Disp: 30 each, Rfl: 0   clonazePAM (KLONOPIN) 0.5 MG tablet, Take 0.5 mg by mouth at bedtime as needed., Disp: , Rfl:    clotrimazole (LOTRIMIN) 1 % cream, Apply to affected area 2 times daily (Patient taking differently: 1 Application 2 (two) times daily as needed. Apply to affected area 2 times daily), Disp: 40 g, Rfl: 0   desvenlafaxine (PRISTIQ) 50 MG 24 hr tablet, Take 50 mg by mouth daily., Disp: , Rfl:    folic acid (FOLVITE) 1 MG tablet, Take 1 mg by mouth daily., Disp: , Rfl:    gabapentin (NEURONTIN) 300 MG capsule, Take 300 mg by  mouth 3 (three) times daily., Disp: , Rfl:    hydroxychloroquine (PLAQUENIL) 200 MG tablet, Take 200 mg by mouth daily., Disp: , Rfl:    levothyroxine (SYNTHROID) 100 MCG tablet, Take 100 mcg by mouth daily before breakfast., Disp: , Rfl:    methotrexate (RHEUMATREX) 2.5 MG tablet, Take 17.5 mg by mouth every Wednesday., Disp: , Rfl:    MOUNJARO 2.5 MG/0.5ML Pen, Inject 2.5 mg into the skin once a week., Disp: , Rfl:    nystatin (MYCOSTATIN/NYSTOP) powder, Apply 1 Application topically 3 (three) times daily., Disp: , Rfl:    omeprazole (PRILOSEC) 20 MG capsule, Take 20 mg by mouth every morning. , Disp: , Rfl:    ondansetron (ZOFRAN-ODT) 4 MG disintegrating tablet, Take 4 mg by mouth every 8 (eight) hours as needed., Disp: , Rfl:    polyethylene glycol (MIRALAX / GLYCOLAX) 17 g packet, Take 17 g by mouth 2 (two) times daily., Disp: 60 packet, Rfl: 2   QUEtiapine (SEROQUEL) 50 MG tablet, Take 1.5 tablets (75 mg total) by mouth at bedtime. Dose increase, Disp: 45 tablet, Rfl: 1   apixaban (ELIQUIS) 5 MG TABS tablet, Take 1 tablet (5 mg total) by mouth 2 (two) times daily. Hold Eliquis till 12/16/22 and if no further bleeding, resume on 12/17/22, Disp: 60 tablet, Rfl: 0   bisacodyl (DULCOLAX) 5 MG EC tablet, Take 2 tablets (10 mg total) by mouth at bedtime., Disp: 60 tablet, Rfl: 2  Vitals   Vitals:   10/17/23 0540 10/17/23 0624 10/17/23 0640 10/17/23 0724  BP: 138/71 (!) 144/67    Pulse: 85 88    Resp: 16 14    Temp:   97.9 F (36.6 C) 97.7 F (36.5 C)  TempSrc:  Axillary Oral  SpO2: 96% 94%    Weight:      Height:        Body mass index is 46.41 kg/m.  Physical Exam   Constitutional: Very sleepy, chronically ill-appearing Psych: Affect too sleepy to assess Eyes: No scleral injection.  HENT: No OP obstruction.  Head: Normocephalic.  Cardiovascular: Normal rate and regular rhythm.  Respiratory: Effort normal, non-labored breathing.  On 1 L nasal cannula GI: Soft.  No distension.  There is no tenderness.  Skin: Scattered bruising/skin changes  Neurologic Examination   Neuro: Mental Status: Extremely lethargic which significantly limits examination.  When awakened with mild noxious stimulus she is able to state her age, the month, states that she is at [facility] but thinks she is in [place].  Immediately falls asleep even while being questioned if there is no noxious stimulation.  She is too sleepy to reliably assess language testing.  Complains of burning pain in her back and asked to be repositioned when awoken but falls asleep before repositioning can be attempted.  Is able to recall that she is here because she had muscle spasms, but cannot give any other significant history Cranial Nerves: II: Pupils are equal, round, and reactive to light.  Visual fields full to orienting to stimuli in all visual fields III,IV, VI: Orients to examiner bilaterally, some nystagmus which may be medication effect V: Facial sensation is symmetric to light eyelash brush VII: Facial movement is symmetric.  VIII: hearing is intact to voice Motor/sensory: Left upper extremity extensor position, cries out with attempt to assess tone, which is increased.  Left hip externally rotated. Right upper and lower extremities at least antigravity but too sleepy to participate in confrontational strength testing.  There is drift secondary to her lethargy Severe allodynia to touch of the bilateral feet Cerebellar: Too sleepy to participate   Labs/Imaging/Neurodiagnostic studies   CBC:  Recent Labs  Lab 11/07/23 0549 10/16/23 1342  WBC 6.2 9.1  NEUTROABS 3.7  --   HGB 11.8* 13.0  HCT 38.3 40.6  MCV 100.3* 95.1  PLT 190 225   Basic Metabolic Panel:  Lab Results  Component Value Date   NA 141 10/16/2023   K 4.2 10/16/2023   CO2 30 10/16/2023   GLUCOSE 114 (H) 10/16/2023   BUN 10 10/16/2023   CREATININE 0.58 10/16/2023   CALCIUM 8.8 (L) 10/16/2023   GFRNONAA >60 10/16/2023   GFRAA >60  07/10/2017   Lipid Panel: No results found for: "LDLCALC" HgbA1c:  Lab Results  Component Value Date   HGBA1C 6.2 (H) 10/02/2023   Urine Drug Screen: No results found for: "LABOPIA", "COCAINSCRNUR", "LABBENZ", "AMPHETMU", "THCU", "LABBARB"  Alcohol Level No results found for: "ETH" INR  Lab Results  Component Value Date   INR 1.0 01/31/2022   APTT  Lab Results  Component Value Date   APTT 26 01/31/2022    CT Head without contrast(Personally reviewed): Motion limited study without evidence of acute intracranial abnormality. Remote infarcts.   ASSESSMENT   Given subacute decline as described by husband there I do have some concern that she may be developing dementia.  Diagnosis of rheumatoid arthritis listed in her chart puts her at risk for cervical pathology, will assess for this with MRI cervical spine.  Certainly her age and risk factors including ANCA positivity do put her at risk for stroke so will assess for evidence of CNS vasculitis with MRI brain.  However overall given the temporal information of a gradual decline  since the start of the pandemic as well as worsening with addition of baclofen, with major complaint being easy agitation, I am concerned about an underlying dementia with exacerbation due to polypharmacy.  Tried to discuss with husband several times that she may need reduction in some of her medications as aging can make people more sensitive to medications; he expressed that he is not concerned about the medications she is taking  RECOMMENDATIONS  -MRI brain with and without contrast -Routine EEG -Reduce baclofen dose to 5 mg 3 times daily -Reversible causes of dementia labs (TSH recently checked and normal, will check ammonia, HIV, RPR, thiamine, B12) -Outpatient neurocognitive testing -Neurology will follow along ______________________________________________________________________    Signed, Gordy Councilman, MD [facility]

## 2023-10-17 NOTE — Plan of Care (Signed)
Pt is agitated and has poor safety awareness. Ava in room. 2L O2 NS. PRM meds given.    Problem: Education: Goal: Ability to describe self-care measures that may prevent or decrease complications (Diabetes Survival Skills Education) will improve Outcome: Progressing Goal: Individualized Educational Video(s) Outcome: Progressing   Problem: Fluid Volume: Goal: Ability to maintain a balanced intake and output will improve Outcome: Progressing   Problem: Health Behavior/Discharge Planning: Goal: Ability to identify and utilize available resources and services will improve Outcome: Progressing Goal: Ability to manage health-related needs will improve Outcome: Progressing   Problem: Metabolic: Goal: Ability to maintain appropriate glucose levels will improve Outcome: Progressing   Problem: Nutritional: Goal: Maintenance of adequate nutrition will improve Outcome: Progressing Goal: Progress toward achieving an optimal weight will improve Outcome: Progressing   Problem: Skin Integrity: Goal: Risk for impaired skin integrity will decrease Outcome: Progressing   Problem: Tissue Perfusion: Goal: Adequacy of tissue perfusion will improve Outcome: Progressing

## 2023-10-17 NOTE — ED Notes (Signed)
Pt swallowing apple juice without apparent difficulty

## 2023-10-17 NOTE — ED Notes (Signed)
Pt repositioned for comfort pt remains verbally aggressive toward RN pt aox3

## 2023-10-17 NOTE — Progress Notes (Signed)
Progress Note   Patient: Ariel Alvarez ZOX:[id number] DOB: 1946-04-24 DOA: 10/16/2023     0 DOS: the patient was seen and examined on 10/17/2023   Brief hospital course: Ariel Alvarez is a 77 y.o. female with medical history significant for anxiety, morbid obesity, collagen vascular disease, vasculitis, depression, GERD, Hx DVT on Eliquis, hypothyroidism and CVA with spastic left-sided hemiplegia, recently hospitalized from 12/3 to 10/05/23 with acute metabolic encephalopathy that was attributed to polypharmacy, ruled out for acute infection, who presents to the ED for altered mental status, agitation and muscle spasms.  She is unable to contribute to the history.  Patient was seen in the ED the day prior with muscle spasms and was prescribed baclofen and discharge.  Since arrival in the ED she has been very agitated and hitting out, requiring Haldol for sedation. ED course and data review: Vitals within normal limits. Workup including BMP, CBC, hepatic function panel and urinalysis all unremarkable. Head CT showed remote infarcts, chest x-ray nonacute and left upper extremity venous Doppler negative for DVT   Patient treated with Ativan and Haldol Hospitalist consulted for admission.  At the time of my evaluation patient very somnolent due to medication administered in the ED and unable to contribute to history.  She did ask when awoken if I could send her home back to her husband.   Assessment and Plan:   * Acute encephalopathy Possible medication side effects versus withdrawal Possible underlying dementia/chronic encephalopathy History of treatment for some months related to polypharmacy treated with Narcan earlier this month 12/3.   Prescribed baclofen in the ED on 12/17 (Patient previously on baclofen, Pristiq, Seroquel, Klonopin, gabapentin--med list not current) Acute infection not suspected  Continue Haldol, Ativan as needed Appreciate neurology input, concern for possible  dementia but will rule out CNS vasculitis with an MRI of the brain Patient is awake and oriented only to person but is at increased risk for falls.  Place patient on fall precautions      Obesity, Class III, BMI 40-49.9 (morbid obesity) (HCC) Complicating factor to overall prognosis and care     Dyslipidemia Continue statin   Hypothyroidism Continue Synthroid.   Collagen vascular disease (HCC) History of rheumatoid arthritis Plaquenil, folic acid.   Depression/Anxiety Effexor XR. Holding Klonopin  Continue trazodone prn insomnia    Type 2 diabetes mellitus with peripheral neuropathy (HCC) Continue consistent carbohydrate diet Sliding scale insulin coverage Holding Neurontin.    History of DVT (deep vein thrombosis) history of PE. Continue Eliquis.    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) Continue baclofen.           Subjective: Awake and alert.  Oriented only to person  Physical Exam: Vitals:   10/17/23 0540 10/17/23 0624 10/17/23 0640 10/17/23 0724  BP: 138/71 (!) 144/67    Pulse: 85 88    Resp: 16 14    Temp:   97.9 F (36.6 C) 97.7 F (36.5 C)  TempSrc:   Axillary Oral  SpO2: 96% 94%    Weight:      Height:       Vitals and nursing note reviewed.  Constitutional:      General: Awake, oriented only to person    Comments:  HENT:     Head: Normocephalic and atraumatic.  Cardiovascular:     Rate and Rhythm: Normal rate and regular rhythm.     Heart sounds: Normal heart sounds.  Pulmonary:     Effort: Pulmonary effort is normal.  Breath sounds: Normal breath sounds.  Abdominal:     Palpations: Abdomen is soft.     Tenderness: There is no abdominal tenderness.  Neurological:     General: Generalized weakness    Data Reviewed: Labs reviewed There are no new results to review at this time.  Family Communication: Discussed patient's condition and plan of care with patient's husband over the phone.  States that patient  had an issue with the deputy  because she had been calling 911 repeatedly and has been charged to court.  He was wondering if we can write a letter so that the charges can be dropped and he will have to take her to court.  He also notes that she has had issues which sound like sundowning and becomes more agitated in the evenings/nights.  Disposition: Status is: Observation The patient remains OBS appropriate and will d/c before 2 midnights.  Planned Discharge Destination:  TBD    Time spent: 35 minutes  Author: Lucile Shutters, MD 10/17/2023 2:54 PM  For on call review [email or web address].

## 2023-10-17 NOTE — ED Notes (Signed)
Pt continues to scream out for help, when going in to help pt is altered and very aggressive.

## 2023-10-17 NOTE — ED Notes (Signed)
Pt reports sleep apnea hx pt O2 decreases to 87% while resting quietly 2L O2 applied via [place] to increase O2 to 93% pt appears calm and is cooperative @ this time

## 2023-10-17 NOTE — ED Notes (Signed)
Pt cleaned new brief applied

## 2023-10-17 NOTE — Progress Notes (Signed)
[place] only A/Ox self on arrival to unit. Unable to complete admission assessment. Patient was noted to have maceration from moisture to the center of her buttocks. Telesitter on and in place.

## 2023-10-17 NOTE — ED Notes (Signed)
Urine tox screen added per General Lab Tech

## 2023-10-17 NOTE — ED Notes (Signed)
Patient on tele sitter at this time

## 2023-10-17 NOTE — Procedures (Signed)
Patient Name: Ariel Alvarez  MRN: [id number]  Epilepsy Attending: Charlsie Quest  Referring Physician/Provider: Gordy Councilman, MD  Date: 10/17/2023 Duration: 27.02 mins  Patient history: 77yo F with ams getting eeg to evaluate for seizur  Level of alertness: comatose/ lethargic   AEDs during EEG study: None  Technical aspects: This EEG study was done with scalp electrodes positioned according to the 10-20 International system of electrode placement. Electrical activity was reviewed with band pass filter of 1-70Hz , sensitivity of 7 uV/mm, display speed of 44mm/sec with a 60Hz  notched filter applied as appropriate. EEG data were recorded continuously and digitally stored.  Video monitoring was available and reviewed as appropriate.  Description: EEG showed continuous generalized rhythmic 3 to 5 Hz theta-delta slowing. Brief 1-3 seconds of generalized eeg attenuation was also noted. Hyperventilation and photic stimulation were not performed.     ABNORMALITY - Continuous slow, generalized  IMPRESSION: This study is suggestive of severe diffuse encephalopathy. No seizures or epileptiform discharges were seen throughout the recording.  [name] [name]

## 2023-10-18 DIAGNOSIS — G9340 Encephalopathy, unspecified: Secondary | ICD-10-CM | POA: Diagnosis not present

## 2023-10-18 LAB — GLUCOSE, CAPILLARY
Glucose-Capillary: 129 mg/dL — ABNORMAL HIGH (ref 70–99)
Glucose-Capillary: 118 mg/dL — ABNORMAL HIGH (ref 70–99)
Glucose-Capillary: 118 mg/dL — ABNORMAL HIGH (ref 70–99)
Glucose-Capillary: 100 mg/dL — ABNORMAL HIGH (ref 70–99)

## 2023-10-18 LAB — RPR: RPR Ser Ql: NONREACTIVE

## 2023-10-18 MED ORDER — BACLOFEN 10 MG PO TABS
5.0000 mg | ORAL_TABLET | Freq: Every day | ORAL | Status: DC
  Administered 2023-10-20: 5 mg via ORAL
  Filled 2023-10-18: qty 1

## 2023-10-18 MED ORDER — BACLOFEN 10 MG PO TABS: 5.0000 mg | ORAL_TABLET | Freq: Every day | ORAL | Status: DC

## 2023-10-18 MED ORDER — BACLOFEN 10 MG PO TABS
5.0000 mg | ORAL_TABLET | Freq: Two times a day (BID) | ORAL | Status: AC
  Administered 2023-10-18 – 2023-10-20 (×4): 5 mg via ORAL
  Filled 2023-10-18 (×4): qty 1

## 2023-10-18 MED ORDER — BACLOFEN 10 MG PO TABS: 5.0000 mg | ORAL_TABLET | Freq: Two times a day (BID) | ORAL | Status: DC

## 2023-10-18 NOTE — TOC Initial Note (Signed)
Transition of Care [facility]) - Initial/Assessment Note    Patient Details  Name: Ariel Alvarez MRN: [id number] Date of Birth: [birth year]  Transition of Care [facility]) CM/SW Contact:    Allena Katz, [name] Phone Number: 10/18/2023, 11:39 AM  Clinical Narrative:    Pt admitted from home with husband her with AMS. Pt active with a sitter and is very agitated. CSW will continue to follow. Pt is currently active with [facility].                Expected Discharge Plan: Skilled Nursing Facility     Patient Goals and CMS Choice            Expected Discharge Plan and Services                                              Prior Living Arrangements/Services              Need for Family Participation in Patient Care: Yes (Comment)        Activities of Daily Living   ADL Screening (condition at time of admission) Independently performs ADLs?: No  Permission Sought/Granted                  Emotional Assessment       Orientation: : Fluctuating Orientation (Suspected and/or reported Sundowners)      Admission diagnosis:  Acute encephalopathy [G93.40] Altered mental status, unspecified altered mental status type [R41.82] Patient Active Problem List   Diagnosis Date Noted   Acute encephalopathy 10/16/2023   Generalized weakness 10/02/2023   Acute lower UTI 10/02/2023   GERD without esophagitis 10/02/2023   History of vasculitis 10/02/2023   Anxiety and depression 10/02/2023   Recurrent UTI 09/10/2023   Dysuria 09/10/2023   Sepsis due to pneumonia (HCC) 06/09/2023   Depression 06/09/2023   Type 2 diabetes mellitus with peripheral neuropathy (HCC) 06/09/2023   History of DVT (deep vein thrombosis) 06/09/2023   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) 06/09/2023   Acute hypoxemic respiratory failure due to COVID-19 (HCC) 06/08/2023   Hypoxia 12/14/2022   COPD with acute bronchitis (HCC) 12/14/2022   History of pulmonary embolism  12/14/2022   Chronic anticoagulation 12/14/2022   Bleeding gums 12/14/2022   Rheumatoid arthritis (HCC) 12/06/2022   CAP (community acquired pneumonia) 01/31/2022   Diabetes (HCC) 01/31/2022   Acute respiratory failure with hypoxia (HCC) 01/31/2022   Weakness of both lower extremities 01/31/2022   Lactic acidosis 01/31/2022   Moderate episode of recurrent major depressive disorder (HCC) 10/03/2021   Cognitive disorder 10/03/2021   Episodic mood disorder (HCC) 10/03/2021   Psychosis (HCC) 10/03/2021   Gait disturbance 12/19/2020   Rash 07/25/2020   Increased frequency of urination 07/25/2020   Closed nondisplaced fracture of proximal phalanx of left little finger with routine healing 04/07/2020   Hyperlipidemia 12/08/2019   Stroke (HCC) 12/08/2019   Ulcerated, foot, unspecified laterality, limited to breakdown of skin (HCC) 12/08/2019   Other constipation 09/28/2019   Dysphagia 09/28/2019   Pelvic pain in female 05/18/2019   Age-related osteoporosis without current pathological fracture 08/08/2018   Urinary hesitancy 10/04/2017   Routine health maintenance 08/26/2017   Candidiasis 08/11/2017   Obesity, Class III, BMI 40-49.9 (morbid obesity) (HCC) 07/30/2017   Collagen vascular disease (HCC) 05/21/2017   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) 05/21/2017  Chronic pain 05/21/2017   Cerebrovascular 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) 05/06/2017   Preoperative clearance 05/06/2017   DVT (deep venous thrombosis) (HCC) 03/24/2017   Contracture of joint of left hand 02/17/2017   Other pulmonary embolism without acute cor pulmonale (HCC) 02/06/2017   Right pontine stroke (HCC) 02/03/2017   Vasculitis (HCC) 01/29/2017   Abnormal ANCA test 12/30/2016   Right carpal tunnel syndrome 04/06/2016   GERD (gastroesophageal reflux disease) 12/31/2014   Dyslipidemia 04/06/2014   Calculus of kidney and ureter 05/06/2012   Primary insomnia 07/06/2011    Hypothyroidism 07/06/2011   Depressive disorder 07/06/2011   Spastic hemiplegia affecting nondominant side (HCC) 03/27/2011   PCP:  Pcp, No Pharmacy:   [facility] [id number] [place], [place] - [address] [place] [address] Phone: [phone] Fax: [phone]  [id number] - [place], [place] - [address] AT [facility] [address] [place] [address] Phone: [phone] Fax: [phone]     Social Drivers of Health (SDOH) Social History: SDOH Screenings   Food Insecurity: Patient Unable To Answer (10/17/2023)  Housing: Patient Unable To Answer (10/17/2023)  Transportation Needs: Patient Unable To Answer (10/17/2023)  Utilities: Patient Unable To Answer (10/17/2023)  Depression (PHQ2-9): High Risk (10/03/2021)  Financial Resource Strain: Medium Risk (10/16/2019)   Received from [facility], [facility]  Physical Activity: Inactive (10/16/2019)   Received from [facility], [facility]  Tobacco Use: Medium Risk (10/16/2023)  Health Literacy: Low Risk  (02/04/2021)   Received from [facility], [facility]   SDOH Interventions:     Readmission Risk Interventions     No data to display

## 2023-10-18 NOTE — Care Management Obs Status (Signed)
MEDICARE OBSERVATION STATUS NOTIFICATION   Patient Details  Name: ELAIYA NOSAL MRN: [id number] Date of Birth: 19-Nov-1945   Medicare Observation Status Notification Given:  Yes    Jetty Berland, [name] 10/18/2023, 9:28 AM

## 2023-10-18 NOTE — Plan of Care (Addendum)
Patient is alert and oriented X 1. Telesitter at bed side. She was agitated one time , got haldol. It has little effect. plan of care ongoing.  Problem: Education: Goal: Ability to describe self-care measures that may prevent or decrease complications (Diabetes Survival Skills Education) will improve Outcome: Progressing Goal: Individualized Educational Video(s) Outcome: Progressing   Problem: Coping: Goal: Ability to adjust to condition or change in health will improve Outcome: Progressing   Problem: Fluid Volume: Goal: Ability to maintain a balanced intake and output will improve Outcome: Progressing   Problem: Health Behavior/Discharge Planning: Goal: Ability to identify and utilize available resources and services will improve Outcome: Progressing Goal: Ability to manage health-related needs will improve Outcome: Progressing   Problem: Metabolic: Goal: Ability to maintain appropriate glucose levels will improve Outcome: Progressing   Problem: Nutritional: Goal: Maintenance of adequate nutrition will improve Outcome: Progressing Goal: Progress toward achieving an optimal weight will improve Outcome: Progressing   Problem: Skin Integrity: Goal: Risk for impaired skin integrity will decrease Outcome: Progressing   Problem: Tissue Perfusion: Goal: Adequacy of tissue perfusion will improve Outcome: Progressing   Problem: Education: Goal: Knowledge of General Education information will improve Description: Including pain rating scale, medication(s)/side effects and non-pharmacologic comfort measures Outcome: Progressing   Problem: Health Behavior/Discharge Planning: Goal: Ability to manage health-related needs will improve Outcome: Progressing   Problem: Clinical Measurements: Goal: Ability to maintain clinical measurements within normal limits will improve Outcome: Progressing Goal: Will remain free from infection Outcome: Progressing Goal: Diagnostic test results  will improve Outcome: Progressing Goal: Respiratory complications will improve Outcome: Progressing Goal: Cardiovascular complication will be avoided Outcome: Progressing   Problem: Activity: Goal: Risk for activity intolerance will decrease Outcome: Progressing   Problem: Nutrition: Goal: Adequate nutrition will be maintained Outcome: Progressing   Problem: Coping: Goal: Level of anxiety will decrease Outcome: Progressing   Problem: Elimination: Goal: Will not experience complications related to bowel motility Outcome: Progressing Goal: Will not experience complications related to urinary retention Outcome: Progressing   Problem: Pain Management: Goal: General experience of comfort will improve Outcome: Progressing   Problem: Safety: Goal: Ability to remain free from injury will improve Outcome: Progressing   Problem: Skin Integrity: Goal: Risk for impaired skin integrity will decrease Outcome: Progressing

## 2023-10-18 NOTE — Progress Notes (Addendum)
Progress Note   Patient: Ariel Alvarez WCB:[id number] DOB: August 10, 1946 DOA: 10/16/2023     0 DOS: the patient was seen and examined on 10/18/2023   Brief hospital course: YEIRI STROSNIDER is a 77 y.o. female with medical history significant for anxiety, morbid obesity, collagen vascular disease, vasculitis, depression, GERD, Hx DVT on Eliquis, hypothyroidism and CVA with spastic left-sided hemiplegia, recently hospitalized from 12/3 to 10/05/23 with acute metabolic encephalopathy that was attributed to polypharmacy, ruled out for acute infection, who presents to the ED for altered mental status, agitation and muscle spasms.  She is unable to contribute to the history.  Patient was seen in the ED the day prior with muscle spasms and was prescribed baclofen and discharge.  Since arrival in the ED she has been very agitated and hitting out, requiring Haldol for sedation. ED course and data review: Vitals within normal limits. Workup including BMP, CBC, hepatic function panel and urinalysis all unremarkable. Head CT showed remote infarcts, chest x-ray nonacute and left upper extremity venous Doppler negative for DVT   Patient treated with Ativan and Haldol Hospitalist consulted for admission.  At the time of my evaluation patient very somnolent due to medication administered in the ED and unable to contribute to history.  She did ask when awoken if I could send her home back to her husband.   Assessment and Plan:  * Acute encephalopathy Rule out delirium Possible medication side effects versus withdrawal Possible underlying dementia/chronic encephalopathy History of similar episode several months ago related to polypharmacy treated with Narcan earlier this month 12/3.   Prescribed baclofen in the ED on 12/17 (Patient previously on baclofen, Pristiq, Seroquel, Klonopin, gabapentin--med list not current) Acute infection not suspected.  Acute encephalopathy thought to be medication induced in a  patient with presumed underlying dementia Continue Haldol, Ativan as needed for agitation Appreciate neurology input, concern for possible dementia but will rule out CNS vasculitis with an MRI of the brain. MRI of the brain/cervical spine showed no acute intracranial process. No evidence of vasculitis. Mild-to-moderate spinal canal stenosis and mild bilateral neural foraminal narrowing at C5-C6 and C6-C7. Mild right neural foraminal narrowing at C3-C4 and mild left neural foraminal narrowing at C4-C5. EEG is suggestive of severe diffuse encephalopathy. No seizures or epileptiform discharges were seen throughout the recording.  Discussed with neurologist  "Further reduce baclofen to 5 mg BID x 3 days, then 5 mg at bedtime x 3 days, then STOP Consider PT referral for spasms or lidocaine gel or other topicals if these recur; at this time she is comfortable Reversible causes of dementia labs reassuring to date, thiamine still pending Outpatient neurocognitive testing" Patient is awake and oriented only to person but is at increased risk for falls.   Continue fall precautions       Obesity, Class III, BMI 40-49.9 (morbid obesity) (HCC) Complicating factor to overall prognosis and care      Dyslipidemia Continue statin    Hypothyroidism Continue Synthroid.   Collagen vascular disease (HCC) History of rheumatoid arthritis Plaquenil, folic acid.    Depression/Anxiety Effexor XR. Holding Klonopin  Continue trazodone prn insomnia     Type 2 diabetes mellitus with peripheral neuropathy (HCC) Continue consistent carbohydrate diet Sliding scale insulin coverage Holding Neurontin.     History of DVT (deep vein thrombosis) history of PE. Continue Eliquis.     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) Continue baclofen.             Subjective:  Patient is seen and examined at the bedside. She is awake and alert. Oriented only to person.  Physical  Exam: Vitals:   10/17/23 1621 10/17/23 2009 10/18/23 0510 10/18/23 0746  BP: (!) 150/72 136/79 (!) 152/57 (!) 145/75  Pulse: 85 89 90 90  Resp: 17 18 18 16   Temp: (!) 97.4 F (36.3 C) 97.6 F (36.4 C) 98.3 F (36.8 C) 98.1 F (36.7 C)  TempSrc:  Oral Oral   SpO2: 97% 94% 90% 94%  Weight:      Height:          Data Reviewed: Labs reviewed There are no new results to review at this time.  Family Communication: None  Disposition: Status is: Observation The patient remains OBS appropriate and will d/c before 2 midnights.  Planned Discharge Destination: Skilled nursing facility    Time spent: 33 minutes  Author: Lucile Shutters, MD 10/18/2023 12:56 PM  For on call review [email or web address].

## 2023-10-18 NOTE — Progress Notes (Signed)
NEUROLOGY CONSULT FOLLOW UP NOTE   Date of service: October 18, 2023 Patient Name: Ariel Alvarez MRN:  [id number] DOB:  04/25/1946  Brief HPI  Ariel Alvarez is a 77 y.o. woman with a past medical history significant for right pontine stroke complicated by left hemiplegia, BMI 46.4, pulmonary embolus on Eliquis, ANCA vasculitis and rheumatoid arthritis on Plaquenil and methotrexate, anxiety/depression on Klonopin and Effexor and Seroquel, hypothyroidism   She presents with altered mental status   Per chart review she fell from her wheelchair on 12/3 and was admitted through 12/7 with concern for UTI and potential polypharmacy contributing to her altered mental status.   She contacted her PCP on 12/17 for refill of Klonopin.  She also presented on 12/17 to the ED with muscle spasms for which baclofen was added.  Additionally a phone note from PCP on 12/17 notes that the husband was expressing significant caregiver stress with increasing erratic behavior at home over the past 5 or 6 days at that time, with the patient calling 911 multiple times and ending up with an altercation in with the sheriff who called her a drug addict and wrote her a citation for which she will need to appear in court in January.  PCP was concerned about potential contribution of her ANCA vasculitis to her symptoms and there was consideration for plan for direct admission after evaluation in clinic   Here the patient did receive 5 mg of Haldol at 2049 as well as 2 mg of Ativan at 2048, 2 tablets of 5-325 hydrocodone-acetaminophen at 1:20 AM and 10:13 AM   In this setting she is quite somnolent on my evaluation   Per husband, since COVID-19 pandemic when she became housebound, she had a change in personality; when adult day care center opened back up, she was no longer able to go the center due to her behavior (mean/agitated) -- he estimates this was about 1.5 years ago Not mean to everyone (nice to EMS typically), but  has episodes of getting very upset  Also has increasing episodes of confusion, for example couldn't name president of [place] to EMS yesterday or name how many quarters in a dollar   She takes Klonopin as needed, when very agitated or for sleep.  Likely taking it nightly at this time although husband does not give a clear answer as to how often she is taking it   He initially reported that she is not taking any new medications as well, but when I asked about the baclofen specifically he did state that that is a new medication     Interval Hx/subjective   - Feeling "not bad" - Too drowsy for ROS  - Received haldol for agitation at 2 am and 10 am  Vitals   Current vital signs: BP (!) 145/75 (BP Location: Right Arm)   Pulse 90   Temp 98.1 F (36.7 C)   Resp 16   Ht 5\' 3"  (1.6 m)   Wt 118.8 kg   SpO2 94%   BMI 46.41 kg/m  Vital signs in last 24 hours: Temp:  [97.4 F (36.3 C)-98.3 F (36.8 C)] 98.1 F (36.7 C) (12/20 0746) Pulse Rate:  [85-90] 90 (12/20 0746) Resp:  [16-18] 16 (12/20 0746) BP: (135-152)/(57-79) 145/75 (12/20 0746) SpO2:  [90 %-100 %] 94 % (12/20 0746)   Vitals:   10/17/23 1621 10/17/23 2009 10/18/23 0510 10/18/23 0746  BP: (!) 150/72 136/79 (!) 152/57 (!) 145/75  Pulse: 85 89 90 90  Resp: 17 18 18 16   Temp: (!) 97.4 F (36.3 C) 97.6 F (36.4 C) 98.3 F (36.8 C) 98.1 F (36.7 C)  TempSrc:  Oral Oral   SpO2: 97% 94% 90% 94%  Weight:      Height:         Body mass index is 46.41 kg/m.  Physical Exam   Constitutional: Very sleepy, chronically ill-appearing Psych: Affect too sleepy to assess Eyes: No scleral injection.  HENT: No OP obstruction.  Head: Normocephalic.  Cardiovascular: Normal rate and regular rhythm.  Respiratory: Effort normal, non-labored breathing.  On 1 L nasal cannula GI: Soft.  No distension. There is no tenderness.  Skin: Scattered bruising/skin changes  Neurologic Examination   Neuro: Mental Status: Extremely lethargic  which significantly limits examination.  When awakened with mild noxious stimulus she is able to state her age, the but not the month, nor the place.  Immediately falls asleep even while being questioned if there is no noxious stimulation.  She is too sleepy to reliably assess language testing.   Cranial Nerves: II: Pupils are equal, round, and reactive to light III,IV, VI: Orients to examiner bilaterally, no nystagmus today V: Facial sensation is symmetric to light eyelash brush VII: Facial movement is symmetric (social smile) VIII: hearing is intact to voice Motor/sensory: Left upper extremity extensor position, cries out with attempt to assess tone, which is increased.  Left hip externally rotated. Right upper and lower extremities at least antigravity but too sleepy to participate in confrontational strength testing.  There is drift secondary to her lethargy Allodynia to touch of the bilateral feet persists Cerebellar: Too sleepy to participate  Medications  Current Facility-Administered Medications:    acetaminophen (TYLENOL) tablet 650 mg, 650 mg, Oral, Q6H PRN **OR** acetaminophen (TYLENOL) suppository 650 mg, 650 mg, Rectal, Q6H PRN, Andris Baumann, MD   apixaban Everlene Balls) tablet 5 mg, 5 mg, Oral, BID, Lindajo Royal V, MD, 5 mg at 10/18/23 1610   aspirin EC tablet 81 mg, 81 mg, Oral, Daily, Lindajo Royal V, MD, 81 mg at 10/18/23 9604   atorvastatin (LIPITOR) tablet 40 mg, 40 mg, Oral, BH-q7a, Lindajo Royal V, MD, 40 mg at 10/18/23 0558   baclofen (LIORESAL) tablet 5 mg, 5 mg, Oral, TID, Keontay Vora L, MD, 5 mg at 10/18/23 5409   folic acid (FOLVITE) tablet 1 mg, 1 mg, Oral, Daily, Lindajo Royal V, MD, 1 mg at 10/18/23 8119   HYDROcodone-acetaminophen (NORCO/VICODIN) 5-325 MG per tablet 1-2 tablet, 1-2 tablet, Oral, Q4H PRN, Andris Baumann, MD, 2 tablet at 10/18/23 1478   hydroxychloroquine (PLAQUENIL) tablet 200 mg, 200 mg, Oral, Daily, Lindajo Royal V, MD, 200 mg at 10/18/23  0814   insulin aspart (novoLOG) injection 0-15 Units, 0-15 Units, Subcutaneous, TID WC, Lindajo Royal V, MD, 2 Units at 10/18/23 0813   insulin aspart (novoLOG) injection 0-5 Units, 0-5 Units, Subcutaneous, QHS, Andris Baumann, MD   levothyroxine (SYNTHROID) tablet 100 mcg, 100 mcg, Oral, Q0600, Lindajo Royal V, MD, 100 mcg at 10/18/23 0519   [START ON 10/23/2023] methotrexate (RHEUMATREX) tablet 17.5 mg, 17.5 mg, Oral, Q Wed, Duncan, Hazel V, MD   ondansetron [facility]) tablet 4 mg, 4 mg, Oral, Q6H PRN, 4 mg at 10/17/23 0119 **OR** ondansetron (ZOFRAN) injection 4 mg, 4 mg, Intravenous, Q6H PRN, Andris Baumann, MD   pantoprazole (PROTONIX) EC tablet 40 mg, 40 mg, Oral, Daily, Andris Baumann, MD, 40 mg at 10/18/23 2956 Labs and Diagnostic Imaging  Basic Metabolic Panel: Recent Labs  Lab 10/15/23 0549 10/16/23 1342  NA 140 141  K 3.6 4.2  CL 107 105  CO2 26 30  GLUCOSE 237* 114*  BUN 7* 10  CREATININE 0.51 0.58  CALCIUM 8.0* 8.8*    CBC: Recent Labs  Lab 10/15/23 0549 10/16/23 1342  WBC 6.2 9.1  NEUTROABS 3.7  --   HGB 11.8* 13.0  HCT 38.3 40.6  MCV 100.3* 95.1  PLT 190 225    Coagulation Studies: No results for input(s): "LABPROT", "INR" in the last 72 hours.    Lipid Panel: No results found for: "LDLCALC" HgbA1c:  Lab Results  Component Value Date   HGBA1C 6.2 (H) 10/02/2023   INR  Lab Results  Component Value Date   INR 1.0 01/31/2022   APTT  Lab Results  Component Value Date   APTT 26 01/31/2022   B12 1038 Ammonia normal B1 pending RPR negative HIV neg  MRI Brain w and w/o contrast (Personally reviewed): MRI Cervical spine 1. No acute intracranial process. No evidence of vasculitis. 2. Mild-to-moderate spinal canal stenosis and mild bilateral neural foraminal narrowing at C5-C6 and C6-C7. 3. Mild right neural foraminal narrowing at C3-C4 and mild left neural foraminal narrowing at C4-C5.  rEEG:  EEG showed continuous generalized rhythmic  3 to 5 Hz theta-delta slowing. Brief 1-3 seconds of generalized eeg attenuation was also noted. Hyperventilation and photic stimulation were not performed.    This study is suggestive of severe diffuse encephalopathy. No seizures or epileptiform discharges were seen throughout the recording.    Assessment   Fortunately MRI cervical spine negative for new compression causing worsening spasticity and MRI brain negative for evidence of active CNS vasculitis   Given the temporal information of a gradual decline since the start of the pandemic as well as worsening with addition of baclofen, with major complaint being easy agitation, I am concerned about an underlying dementia; cannot exclude primary effects of polypharmacy in an aging brain.    Yesterday tried to discuss with husband several times that she may need reduction in some of her medications as aging can make people more sensitive to medications; he expressed that he is not concerned about the medications she is taking    Recommendations  -Further reduce baclofen to 5 mg BID x 3 days, then 5 mg at bedtime x 3 days, then STOP -Consider PT referral for spasms or lidocaine gel or other topicals if these recur; at this time she is comfortable -Reversible causes of dementia labs reassuring to date, thiamine still pending -Outpatient neurocognitive testing -Neurology will sign off at the this time, please reach out with any questions/concerns. Discussed with primary team via secure chat ______________________________________________________________________   Nunzio Cory MD-PhD [facility] [phone] [facility] coverage for [facility] is from 8 AM to 4 AM in-house and 4 PM to 8 PM by telephone/video. 8 PM to 8 AM emergent questions or overnight urgent questions should be addressed to Teleneurology On-call or [facility] neurohospitalist; contact information can be found on AMION

## 2023-10-19 DIAGNOSIS — G9340 Encephalopathy, unspecified: Secondary | ICD-10-CM | POA: Diagnosis not present

## 2023-10-19 DIAGNOSIS — R410 Disorientation, unspecified: Secondary | ICD-10-CM | POA: Diagnosis not present

## 2023-10-19 DIAGNOSIS — T50905A Adverse effect of unspecified drugs, medicaments and biological substances, initial encounter: Secondary | ICD-10-CM | POA: Diagnosis not present

## 2023-10-19 LAB — CBC
HCT: 39.4 % (ref 36.0–46.0)
Hemoglobin: 12.8 g/dL (ref 12.0–15.0)
MCH: 30.8 pg (ref 26.0–34.0)
MCHC: 32.5 g/dL (ref 30.0–36.0)
MCV: 94.7 fL (ref 80.0–100.0)
Platelets: 210 10*3/uL (ref 150–400)
RBC: 4.16 MIL/uL (ref 3.87–5.11)
RDW: 16.5 % — ABNORMAL HIGH (ref 11.5–15.5)
WBC: 7.7 10*3/uL (ref 4.0–10.5)
nRBC: 0 % (ref 0.0–0.2)

## 2023-10-19 LAB — BASIC METABOLIC PANEL
Anion gap: 10 (ref 5–15)
BUN: 9 mg/dL (ref 8–23)
CO2: 28 mmol/L (ref 22–32)
Calcium: 8.5 mg/dL — ABNORMAL LOW (ref 8.9–10.3)
Chloride: 101 mmol/L (ref 98–111)
Creatinine, Ser: 0.53 mg/dL (ref 0.44–1.00)
GFR, Estimated: >60 mL/min (ref >60)
Glucose, Bld: 113 mg/dL — ABNORMAL HIGH (ref 70–99)
Potassium: 3.7 mmol/L (ref 3.5–5.1)
Sodium: 139 mmol/L (ref 135–145)

## 2023-10-19 LAB — URINE CULTURE: Culture: >=100000 — AB

## 2023-10-19 LAB — URINE DRUG SCREEN, QUALITATIVE (ARMC ONLY)
Amphetamines, Ur Screen: NOT DETECTED
Barbiturates, Ur Screen: NOT DETECTED
Benzodiazepine, Ur Scrn: POSITIVE — AB
Cannabinoid 50 Ng, Ur ~~LOC~~: NOT DETECTED
Cocaine Metabolite,Ur ~~LOC~~: NOT DETECTED
MDMA (Ecstasy)Ur Screen: NOT DETECTED
Methadone Scn, Ur: NOT DETECTED
Opiate, Ur Screen: POSITIVE — AB
Phencyclidine (PCP) Ur S: NOT DETECTED
Tricyclic, Ur Screen: NOT DETECTED

## 2023-10-19 LAB — GLUCOSE, CAPILLARY
Glucose-Capillary: 114 mg/dL — ABNORMAL HIGH (ref 70–99)
Glucose-Capillary: 101 mg/dL — ABNORMAL HIGH (ref 70–99)
Glucose-Capillary: 150 mg/dL — ABNORMAL HIGH (ref 70–99)
Glucose-Capillary: 118 mg/dL — ABNORMAL HIGH (ref 70–99)

## 2023-10-19 MED ORDER — CLONAZEPAM 0.5 MG PO TABS
0.5000 mg | ORAL_TABLET | Freq: Two times a day (BID) | ORAL | Status: DC | PRN
  Administered 2023-10-19 – 2023-10-21 (×2): 0.5 mg via ORAL
  Filled 2023-10-19 (×3): qty 1

## 2023-10-19 MED ORDER — CLONAZEPAM 0.5 MG PO TABS: 0.5000 mg | ORAL_TABLET | Freq: Two times a day (BID) | ORAL | Status: DC

## 2023-10-19 MED ORDER — HALOPERIDOL LACTATE 5 MG/ML IJ SOLN
1.0000 mg | Freq: Four times a day (QID) | INTRAMUSCULAR | Status: DC | PRN
  Administered 2023-10-20: 1 mg via INTRAVENOUS
  Filled 2023-10-19: qty 1

## 2023-10-19 MED ORDER — HALOPERIDOL LACTATE 5 MG/ML IJ SOLN
1.0000 mg | Freq: Four times a day (QID) | INTRAMUSCULAR | Status: DC | PRN
  Administered 2023-10-19: 1 mg via INTRAVENOUS
  Filled 2023-10-19: qty 1

## 2023-10-19 NOTE — Consult Note (Cosign Needed Addendum)
[facility] Psychiatric Consult Initial  Patient Name: .Ariel Alvarez  MRN: [id number]  DOB: 12-02-1945  Consult Order details:  Orders (From admission, onward)     Start     Ordered   10/19/23 0739  IP CONSULT TO PSYCHIATRY       Ordering Provider: Lucile Shutters, MD  Provider:  (Not yet assigned)  Question Answer Comment  Location [facility]   Reason for Consult? Agitation      10/19/23 0738             Mode of Visit: In person    Psychiatry Consult Evaluation  Service Date: October 19, 2023 LOS:  LOS: 0 days  Chief Complaint Agitation   Primary Psychiatric Diagnoses   Delirium   Assessment  Ariel Alvarez is a 77 y.o. female admitted: Medicallyfor 10/16/2023  6:30 PM for altered mental status. She carries the psychiatric diagnoses of anxiety and has a past medical history of morbid obesity, collagen vascular disease, vasculitis, depression, GERD, DVT, hypothyroidism and CVA .   Her current presentation of agitation at times related to medical issues is most consistent with delirium. She meets criteria for delirium based on fluctuating attention, mood changes, agitation and difficulty following conversations; significant change from her baseline per her spouse.  Current outpatient psychotropic medications include Klonopin 0.5 mg TID and historically she has had a positive response to these medications but ONLY takes it as needed by her husband. She did not have a UDS on admission to confirm compliance.  Per the PDMP, she did receive a Rx for Klonopin 0.5 mg TID "filled on 12/17" and written by Christianne Borrow, MD. On initial examination, patient is laying in bed on her cell phone with no one online, trying to contact her husband. She is alert and oriented to self only but she has trouble remembering where she is and what day of the week it is. She reported feeling "ok" and denied depression. Patient also denied homicidal/suicidal ideations as well  as auditory/visual hallucinations. She endorsed getting adequate sleep and proper nutrition. Please see plan below for detailed recommendations.   Due to her cognitive stated, her husband was contacted for collateral information.  He reported she was doing well before Covid and was going to the senior activity center during the week and "they just loved her".  When she returned there after Covid, she was irritable and he was told to come and get her.  The facility would not allow her to return, she seemed to have a "total personality change".  He described sundowning symptoms of an increase in irritability and mood changes at night with multiple demands for things at bedtime.  The PCP concerned about dementia diagnosis, no full evaluation by a neurologist.  [name] stated she does take Klonopin 0.5 mg TID but ONLY as needed when she is agitated at home.  "It helps but takes awhile when she gets in those states." Her husband has been to visit regularly and this is not he baseline.  Diagnoses:  Active Hospital problems: Principal Problem:   Acute encephalopathy Active Problems:   Spastic hemiplegia affecting nondominant side (HCC)   Obesity, Class III, BMI 40-49.9 (morbid obesity) (HCC)   Hypothyroidism   Depressive disorder   History of pulmonary embolism   Chronic anticoagulation   Type 2 diabetes mellitus with peripheral neuropathy (HCC)    Plan   ## Psychiatric Medication Recommendations:  Klonopin 0.5 mg BID PRN  ## Medical Decision Making Capacity:  Not specifically addressed in this encounter  ## Further Work-up:  -- Continue Baclofen taper to discontinuation as this medication could be triggering her current symptoms especially with her outpatient gabapentin (not confirmed that she was taking this) UDS  ## Disposition:-- Plan Post Discharge/Psychiatric Care Follow-up resources :  Follow up with neurology for a cognitive baseline /evaluation  ## Behavioral / Environmental:  -Delirium Precautions: Delirium Interventions for Nursing and Staff: - RN to open blinds every AM. - To Bedside: Glasses, hearing aide, and pt's own shoes. Make available to patients. when possible and encourage use. - Encourage po fluids when appropriate, keep fluids within reach. - OOB to chair with meals. - Passive ROM exercises to all extremities with AM & PM care. - RN to assess orientation to person, time and place QAM and PRN. - Recommend extended visitation hours with familiar family/friends as feasible. - Staff to minimize disturbances at night. Turn off television when pt asleep or when not in use.    ## Safety and Observation Level:  - Based on my clinical evaluation, I estimate the patient to be at low risk of self harm in the current setting. - A  CSSR Risk Category:C-SSRS RISK CATEGORY: No Risk  Suicide Risk Assessment: Patient has following modifiable risk factors for suicide: NOne Patient has following non-modifiable or demographic risk factors for suicide: NOne Patient has the following protective factors against suicide: Supportive family, no history of suicide attempts, and no history of NSSIB  Thank you for this consult request. Recommendations have been communicated to the primary team.  We will see her when needed at this time.   Ariel Means, NP       History of Present Illness  Relevant Aspects of [facility] Course:  Admitted on 10/16/2023 for AMS. They admitted her medically with a baclofen taper to discontinuation as it appears her symptoms are due to medications.   Patient Report:  Denies depression and anxiety, yet confused on assessment and focused on calling "[name]" her husband, helped her call after the assessment.  Psych ROS:  Depression: denied Anxiety:  denied Mania (lifetime and current): none Psychosis: (lifetime and current): none  Collateral information:  Contacted her husband, [name] at @1  pm on 12/21  Review of Systems  All other  systems reviewed and are negative.    Psychiatric and Social History  Psychiatric History:  Information collected from patient, chart, and husband  Prev Dx/Sx: anxiety, depression Current Psych Provider: none Home Meds (current): Klonopin 0.5 mg TID PRN Previous Med Trials: Pristiq and Seroquel Therapy: None  Prior Psych Hospitalization: None  Prior Self Harm: None Prior Violence: None  Family Psych History: see be;pw Family Hx suicide: see below  Social History:  Living Situation: lives with her husband Access to weapons/lethal Alvarez: none   Substance History Alcohol: none  Tobacco: none Illicit drugs: none Prescription drug abuse: unsure Rehab hx: none  Exam Findings   Physical Exam: See MD note Vital Signs:  Temp:  [97.5 F (36.4 C)-98.2 F (36.8 C)] 97.6 F (36.4 C) (12/21 0723) Pulse Rate:  [84-94] 88 (12/21 0723) Resp:  [14-18] 18 (12/21 0723) BP: (137-164)/(60-77) 164/67 (12/21 0723) SpO2:  [95 %-100 %] 100 % (12/21 0723) Blood pressure (!) 164/67, pulse 88, temperature 97.6 F (36.4 C), temperature source Oral, resp. rate 18, height 5\' 3"  (1.6 m), weight 118.8 kg, SpO2 100%. Body mass index is 46.41 kg/m.   Mental Status Exam: General Appearance: Casual  Orientation:  Other:  oriented to  self   Memory:  Immediate;   Poor  Concentration:  Concentration: Poor and Attention Span: Poor  Recall:  Fair  Attention  Poor  Eye Contact:  Poor  Speech:  Clear and Coherent  Language:  Fair  Volume:  Normal  Mood: "feeling pretty good"  Affect:  Congruent  Thought Process:  Linear  Thought Content:  WDL  Suicidal Thoughts:  No  Homicidal Thoughts:  No  Judgement:  Fair  Insight:  Fair  Psychomotor Activity:  Normal  Akathisia:  No  Fund of Knowledge:  Fair      Assets:  Social Support  Cognition: Impaired  ADL's:  Impaired  AIMS (if indicated):        Other History   These have been pulled in through the EMR, reviewed, and updated if  appropriate.  Family History:  The patient's family history includes Alcohol abuse in her father; Cancer in her paternal aunt; Depression in her father; Diabetes in her son; Stroke in her paternal grandmother; Suicidality in her father.  Medical History: Past Medical History:  Diagnosis Date   Anxiety    Arthritis    RIGHT HAND   Collagen vascular disease (HCC)    Complication of anesthesia    HARD TO WAKE UP AFTER  ONE SURGERY-PT STAETS SHE WAS GIVEN TOO MUCH ANESTHESIA   Depression    Dyspnea    VERY RARE WITH EXERTION   GERD (gastroesophageal reflux disease)    History of kidney stones    H/O   Hypothyroidism    Stroke (HCC) 1027,2536   X2-LEFT HAD PARALYZED     Surgical History: Past Surgical History:  Procedure Laterality Date   CHOLECYSTECTOMY     KIDNEY STONE SURGERY     METATARSAL HEAD EXCISION Bilateral 04/15/2020   Procedure: METATARSAL HEAD PARTIAL EXCISION - BILATERAL;  Surgeon: Linus Galas, DPM;  Location: [facility] ORS;  Service: Podiatry;  Laterality: Bilateral;     Medications:   Current Facility-Administered Medications:    acetaminophen (TYLENOL) tablet 650 mg, 650 mg, Oral, Q6H PRN, 650 mg at 10/18/23 2226 **OR** acetaminophen (TYLENOL) suppository 650 mg, 650 mg, Rectal, Q6H PRN, Andris Baumann, MD   apixaban Everlene Balls) tablet 5 mg, 5 mg, Oral, BID, Lindajo Royal V, MD, 5 mg at 10/19/23 1027   aspirin EC tablet 81 mg, 81 mg, Oral, Daily, Lindajo Royal V, MD, 81 mg at 10/19/23 1027   atorvastatin (LIPITOR) tablet 40 mg, 40 mg, Oral, BH-q7a, Duncan, Jerrye Beavers V, MD, 40 mg at 10/19/23 6440   baclofen (LIORESAL) tablet 5 mg, 5 mg, Oral, BID, 5 mg at 10/19/23 1027 **FOLLOWED BY** [START ON 10/20/2023] baclofen (LIORESAL) tablet 5 mg, 5 mg, Oral, QHS, Bhagat, Srishti L, MD   folic acid (FOLVITE) tablet 1 mg, 1 mg, Oral, Daily, Lindajo Royal V, MD, 1 mg at 10/19/23 1027   haloperidol lactate (HALDOL) injection 1 mg, 1 mg, Intravenous, Q6H PRN, Agbata, Tochukwu, MD, 1 mg  at 10/19/23 0754   HYDROcodone-acetaminophen (NORCO/VICODIN) 5-325 MG per tablet 1-2 tablet, 1-2 tablet, Oral, Q4H PRN, Andris Baumann, MD, 2 tablet at 10/19/23 0753   hydroxychloroquine (PLAQUENIL) tablet 200 mg, 200 mg, Oral, Daily, Lindajo Royal V, MD, 200 mg at 10/19/23 1028   insulin aspart (novoLOG) injection 0-15 Units, 0-15 Units, Subcutaneous, TID WC, Andris Baumann, MD, 2 Units at 10/18/23 0813   insulin aspart (novoLOG) injection 0-5 Units, 0-5 Units, Subcutaneous, QHS, Andris Baumann, MD   levothyroxine (SYNTHROID) tablet 100 mcg, 100  mcg, Oral, Q0600, Lindajo Royal V, MD, 100 mcg at 10/19/23 0616   [START ON 10/23/2023] methotrexate (RHEUMATREX) tablet 17.5 mg, 17.5 mg, Oral, Q Wed, Duncan, Hazel V, MD   ondansetron [facility]) tablet 4 mg, 4 mg, Oral, Q6H PRN, 4 mg at 10/17/23 0119 **OR** ondansetron (ZOFRAN) injection 4 mg, 4 mg, Intravenous, Q6H PRN, Andris Baumann, MD   pantoprazole (PROTONIX) EC tablet 40 mg, 40 mg, Oral, Daily, Andris Baumann, MD, 40 mg at 10/19/23 1027  Allergies: Allergies  Allergen Reactions   Latex Hives   Prednisone Nausea And Vomiting    Pt states causes N/V even when taken with food.    Ariel Means, NP

## 2023-10-19 NOTE — Plan of Care (Signed)

## 2023-10-19 NOTE — Progress Notes (Signed)
Progress Note   Patient: Ariel Alvarez GEX:[id number] DOB: 04-13-1946 DOA: 10/16/2023     0 DOS: the patient was seen and examined on 10/19/2023   Brief hospital course: DAZSHA MERCADEL is a 77 y.o. female with medical history significant for anxiety, morbid obesity, collagen vascular disease, vasculitis, depression, GERD, Hx DVT on Eliquis, hypothyroidism and CVA with spastic left-sided hemiplegia, recently hospitalized from 12/3 to 10/05/23 with acute metabolic encephalopathy that was attributed to polypharmacy, ruled out for acute infection, who presents to the ED for altered mental status, agitation and muscle spasms.  She is unable to contribute to the history.  Patient was seen in the ED the day prior with muscle spasms and was prescribed baclofen and discharge.  Since arrival in the ED she has been very agitated and hitting out, requiring Haldol for sedation. ED course and data review: Vitals within normal limits. Workup including BMP, CBC, hepatic function panel and urinalysis all unremarkable. Head CT showed remote infarcts, chest x-ray nonacute and left upper extremity venous Doppler negative for DVT   Patient treated with Ativan and Haldol Hospitalist consulted for admission.  At the time of my evaluation patient very somnolent due to medication administered in the ED and unable to contribute to history.  She did ask when awoken if I could send her home back to her husband.   Assessment and Plan:  * Acute encephalopathy Rule out delirium Possible medication side effects versus withdrawal Possible underlying dementia/chronic encephalopathy History of similar episode several months ago related to polypharmacy treated with Narcan earlier this month 12/3.   Prescribed baclofen in the ED on 12/17 (Patient previously on baclofen, Pristiq, Seroquel, Klonopin, gabapentin--med list not current) Acute infection not suspected.  Acute encephalopathy thought to be medication induced in a  patient with presumed underlying dementia Appreciate neurology input, concern for possible dementia but will rule out CNS vasculitis with an MRI of the brain. MRI of the brain/cervical spine showed no acute intracranial process. No evidence of vasculitis. Mild-to-moderate spinal canal stenosis and mild bilateral neural foraminal narrowing at C5-C6 and C6-C7. Mild right neural foraminal narrowing at C3-C4 and mild left neural foraminal narrowing at C4-C5. EEG is suggestive of severe diffuse encephalopathy. No seizures or epileptiform discharges were seen throughout the recording.  Discussed with neurologist  "Further reduce baclofen to 5 mg BID x 3 days, then 5 mg at bedtime x 3 days, then STOP Consider PT referral for spasms or lidocaine gel or other topicals if these recur; at this time she is comfortable Reversible causes of dementia labs reassuring to date, thiamine still pending Outpatient neurocognitive testing" Patient is awake and oriented only to person but is at increased risk for falls.   Continue fall precautions Continues to have episodes of agitation and restlessness Continue Haldol, Ativan as needed for agitation.  Consult behavioral health for medication adjustment       Obesity, Class III, BMI 40-49.9 (morbid obesity) (HCC) Complicating factor to overall prognosis and care       Dyslipidemia Continue statin     Hypothyroidism Continue Synthroid.    Collagen vascular disease (HCC) History of rheumatoid arthritis Plaquenil, folic acid.     Depression/Anxiety Effexor XR. Holding Klonopin  Continue trazodone prn insomnia      Type 2 diabetes mellitus with peripheral neuropathy (HCC) Continue consistent carbohydrate diet Sliding scale insulin coverage Holding Neurontin.     History of DVT (deep vein thrombosis) history of PE. Continue Eliquis.     Spastic hemiplegia  affecting left nondominant side [facility]) Appreciate neurology input.  Patient currently on  the baclofen taper Recommends PT referral or topicals if symptoms recur    Physical deconditioning Appreciate PT input.  Pt is max of 2+ with bed mobility. Lives alone with her spouse/caregiver who is burned out and at risk for injuries  PT recommends SNF on discharge         Subjective: Per nursing was agitated overnight.  Received a dose of Haldol this morning and is calm during my evaluation.  Physical Exam: Vitals:   10/18/23 2122 10/19/23 0004 10/19/23 0527 10/19/23 0723  BP: 137/60 (!) 153/77 (!) 144/68 (!) 164/67  Pulse: 91 88 84 88  Resp: 17 14 18 18   Temp: (!) 97.5 F (36.4 C) 98.2 F (36.8 C) (!) 97.5 F (36.4 C) 97.6 F (36.4 C)  TempSrc:    Oral  SpO2: 98% 97% 99% 100%  Weight:      Height:       Vitals and nursing note reviewed.  Constitutional:      General: Awake, oriented to person and place    Comments:  HENT:     Head: Normocephalic and atraumatic.  Cardiovascular:     Rate and Rhythm: Normal rate and regular rhythm.     Heart sounds: Normal heart sounds.  Pulmonary:     Effort: Pulmonary effort is normal.     Breath sounds: Normal breath sounds.  Abdominal:     Palpations: Abdomen is soft.     Tenderness: There is no abdominal tenderness.  Neurological:     General: Generalized weakness    Data Reviewed: Urine culture yields greater than 100,000 colonies of Staphylococcus hemolyticus.  Most likely contaminant There are no new results to review at this time.  Family Communication: Plan of care discussed with patient's husband over the phone.  All questions and concerns have been addressed  Disposition: Status is: Observation The patient remains OBS appropriate and will d/c before 2 midnights.  Planned Discharge Destination: Skilled nursing facility    Time spent: 34 minutes  Author: Lucile Shutters, MD 10/19/2023 12:36 PM  For on call review [email or web address].

## 2023-10-19 NOTE — [place] FL2 (Signed)
[place] MEDICAID FL2 LEVEL OF CARE FORM     IDENTIFICATION  Patient Name: Ariel Alvarez Birthdate: 1946/07/24 Sex: female Admission Date (Current Location): 10/16/2023  [facility] and [place] Number:  Chiropodist and Address:  [facility], [address] [place], [place] [address]      Provider Number: [id number]  Attending Physician Name and Address:  Lucile Shutters, MD  Relative Name and Phone Number:  Jama Flavors (Significant other)  [phone]    Current Level of Care: Hospital Recommended Level of Care: Skilled Nursing Facility Prior Approval Number:    Date Approved/Denied:   PASRR Number: [id number]  Discharge Plan: SNF    Current Diagnoses: Patient Active Problem List   Diagnosis Date Noted   Acute encephalopathy 10/16/2023   Generalized weakness 10/02/2023   Acute lower UTI 10/02/2023   GERD without esophagitis 10/02/2023   History of vasculitis 10/02/2023   Anxiety and depression 10/02/2023   Recurrent UTI 09/10/2023   Dysuria 09/10/2023   Sepsis due to pneumonia (HCC) 06/09/2023   Depression 06/09/2023   Type 2 diabetes mellitus with peripheral neuropathy (HCC) 06/09/2023   History of DVT (deep vein thrombosis) 06/09/2023   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) 06/09/2023   Acute hypoxemic respiratory failure due to COVID-19 [facility]) 06/08/2023   Hypoxia 12/14/2022   COPD with acute bronchitis (HCC) 12/14/2022   History of pulmonary embolism 12/14/2022   Chronic anticoagulation 12/14/2022   Bleeding gums 12/14/2022   Rheumatoid arthritis (HCC) 12/06/2022   CAP (community acquired pneumonia) 01/31/2022   Diabetes (HCC) 01/31/2022   Acute respiratory failure with hypoxia (HCC) 01/31/2022   Weakness of both lower extremities 01/31/2022   Lactic acidosis 01/31/2022   Moderate episode of recurrent major depressive disorder (HCC) 10/03/2021   Cognitive disorder 10/03/2021   Episodic mood  disorder (HCC) 10/03/2021   Psychosis (HCC) 10/03/2021   Gait disturbance 12/19/2020   Rash 07/25/2020   Increased frequency of urination 07/25/2020   Closed nondisplaced fracture of proximal phalanx of left little finger with routine healing 04/07/2020   Hyperlipidemia 12/08/2019   Stroke (HCC) 12/08/2019   Ulcerated, foot, unspecified laterality, limited to breakdown of skin (HCC) 12/08/2019   Other constipation 09/28/2019   Dysphagia 09/28/2019   Pelvic pain in female 05/18/2019   Age-related osteoporosis without current pathological fracture 08/08/2018   Urinary hesitancy 10/04/2017   Routine health maintenance 08/26/2017   Candidiasis 08/11/2017   Obesity, Class III, BMI 40-49.9 (morbid obesity) (HCC) 07/30/2017   Collagen vascular disease (HCC) 05/21/2017   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) 05/21/2017   Chronic pain 05/21/2017   Cerebrovascular 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) 05/06/2017   Preoperative clearance 05/06/2017   DVT (deep venous thrombosis) (HCC) 03/24/2017   Contracture of joint of left hand 02/17/2017   Other pulmonary embolism without acute cor pulmonale (HCC) 02/06/2017   Right pontine stroke (HCC) 02/03/2017   Vasculitis (HCC) 01/29/2017   Abnormal ANCA test 12/30/2016   Right carpal tunnel syndrome 04/06/2016   GERD (gastroesophageal reflux disease) 12/31/2014   Dyslipidemia 04/06/2014   Calculus of kidney and ureter 05/06/2012   Primary insomnia 07/06/2011   Hypothyroidism 07/06/2011   Depressive disorder 07/06/2011   Spastic hemiplegia affecting nondominant side (HCC) 03/27/2011    Orientation RESPIRATION BLADDER Height & Weight     Self  O2 Incontinent Weight: 118.8 kg Height:  5\' 3"  (160 cm)  BEHAVIORAL SYMPTOMS/MOOD NEUROLOGICAL BOWEL NUTRITION STATUS  Continent Diet  AMBULATORY STATUS COMMUNICATION OF NEEDS Skin   Extensive Assist   Normal                       Personal Care Assistance  Level of Assistance  Bathing, Dressing Bathing Assistance: Limited assistance   Dressing Assistance: Limited assistance     Functional Limitations Info             SPECIAL CARE FACTORS FREQUENCY  PT (By licensed PT), OT (By licensed OT)     PT Frequency: 5 times a week OT Frequency: 5 times a week            Contractures      Additional Factors Info  Code Status, Allergies Code Status Info: Full Allergies Info: Latex, Prednisone           Current Medications (10/19/2023):  This is the current hospital active medication list Current Facility-Administered Medications  Medication Dose Route Frequency Provider Last Rate Last Admin   acetaminophen (TYLENOL) tablet 650 mg  650 mg Oral Q6H PRN Andris Baumann, MD   650 mg at 10/18/23 2226   Or   acetaminophen (TYLENOL) suppository 650 mg  650 mg Rectal Q6H PRN Andris Baumann, MD       apixaban Everlene Balls) tablet 5 mg  5 mg Oral BID Andris Baumann, MD   5 mg at 10/19/23 1027   aspirin EC tablet 81 mg  81 mg Oral Daily Lindajo Royal V, MD   81 mg at 10/19/23 1027   atorvastatin (LIPITOR) tablet 40 mg  40 mg Oral Joaquim Lai, Odetta Pink, MD   40 mg at 10/19/23 1610   baclofen (LIORESAL) tablet 5 mg  5 mg Oral BID Bhagat, Srishti L, MD   5 mg at 10/19/23 1027   Followed by   Melene Muller ON 10/20/2023] baclofen (LIORESAL) tablet 5 mg  5 mg Oral QHS Bhagat, Srishti L, MD       clonazePAM (KLONOPIN) tablet 0.5 mg  0.5 mg Oral BID PRN Charm Rings, NP       folic acid (FOLVITE) tablet 1 mg  1 mg Oral Daily Lindajo Royal V, MD   1 mg at 10/19/23 1027   haloperidol lactate (HALDOL) injection 1 mg  1 mg Intravenous Q6H PRN Agbata, Tochukwu, MD   1 mg at 10/19/23 0754   HYDROcodone-acetaminophen (NORCO/VICODIN) 5-325 MG per tablet 1-2 tablet  1-2 tablet Oral Q4H PRN Andris Baumann, MD   2 tablet at 10/19/23 0753   hydroxychloroquine (PLAQUENIL) tablet 200 mg  200 mg Oral Daily Lindajo Royal V, MD   200 mg at 10/19/23 1028   insulin  aspart (novoLOG) injection 0-15 Units  0-15 Units Subcutaneous TID WC Andris Baumann, MD   2 Units at 10/18/23 0813   insulin aspart (novoLOG) injection 0-5 Units  0-5 Units Subcutaneous QHS Andris Baumann, MD       levothyroxine (SYNTHROID) tablet 100 mcg  100 mcg Oral Q0600 Lindajo Royal V, MD   100 mcg at 10/19/23 0616   [START ON 10/23/2023] methotrexate (RHEUMATREX) tablet 17.5 mg  17.5 mg Oral Q Wed Andris Baumann, MD       ondansetron [facility]) tablet 4 mg  4 mg Oral Q6H PRN Andris Baumann, MD   4 mg at 10/17/23 0119   Or   ondansetron (ZOFRAN) injection 4 mg  4 mg Intravenous Q6H PRN Andris Baumann, MD  pantoprazole (PROTONIX) EC tablet 40 mg  40 mg Oral Daily Andris Baumann, MD   40 mg at 10/19/23 1027     Discharge Medications: Please see discharge summary for a list of discharge medications.  Relevant Imaging Results:  Relevant Lab Results:   Additional Information SS: [id number]  Rodney Langton, RN

## 2023-10-19 NOTE — TOC Progression Note (Signed)
Transition of Care [facility]) - Progression Note    Patient Details  Name: Ariel Alvarez MRN: [id number] Date of Birth: [birth year]  Transition of Care [facility]) CM/SW Contact  Rodney Langton, RN Phone Number: 10/19/2023, 12:55 PM  Clinical Narrative:     Recommendations are for SNF for short term rehab.  Attempted to speak with husband, no answer.  Per PT, he agreed to have patient placed at [facility] or [facility].  Bed search started, TOC will continue to follow and discuss other facility choices with husband in the event that [facility] or [facility] does not have any beds.    Expected Discharge Plan: Skilled Nursing Facility    Expected Discharge Plan and Services                                               Social Determinants of Health (SDOH) Interventions SDOH Screenings   Food Insecurity: Patient Unable To Answer (10/17/2023)  Housing: Patient Unable To Answer (10/17/2023)  Transportation Needs: Patient Unable To Answer (10/17/2023)  Utilities: Patient Unable To Answer (10/17/2023)  Depression (PHQ2-9): High Risk (10/03/2021)  Financial Resource Strain: Medium Risk (10/16/2019)   Received from [facility], [facility]  Physical Activity: Inactive (10/16/2019)   Received from [facility], [facility]  Tobacco Use: Medium Risk (10/16/2023)  Health Literacy: Low Risk  (02/04/2021)   Received from [facility], [facility]    Readmission Risk Interventions     No data to display

## 2023-10-19 NOTE — Progress Notes (Addendum)
Pt screaming aggressively and requesting to  "get out of the bed". Patient repositioned for comfort. MD made aware. New order received. Plan of care ongoing.

## 2023-10-19 NOTE — Evaluation (Signed)
Physical Therapy Evaluation Patient Details Name: Ariel Alvarez MRN: [id number] DOB: [birth year] Today's Date: 10/19/2023  History of Present Illness  Ariel Alvarez is a 77 y.o. female with medical history significant for anxiety, morbid obesity, collagen vascular disease, vasculitis, depression, GERD, Hx DVT on Eliquis, hypothyroidism and CVA with spastic left-sided hemiplegia, recently hospitalized from 12/3 to 10/05/23 with acute metabolic encephalopathy that was attributed to polypharmacy, ruled out for acute infection, who presents to the ED for altered mental status, agitation and muscle spasms.  She is unable to contribute to the history.  Patient was seen in the ED the day prior with muscle spasms and was prescribed baclofen and discharge.  Since arrival in the ED she has been very agitated and hitting out, requiring Haldol for sedation.  Clinical Impression  Pt received in bed with spouse by her side agreed with PT evaluation. Pt lethargic due to medications but cooperative and pleasant today. Pt's spouse stated desire to seek Rehab facility to improve pt's functions so that he can continue to care for her. Spouse requesting [facility] or [facility]. Pt has had a recent decline due to hospitalization and change in condition recently. This is second admission this month. As per Spouse, Prior to last admission pt was ambulatory in home with [facility] and used W/C as well as needed. PT assessment revealed pt is weak and Old CVA makes it more difficult for pt to perform Bed mobility requiring max of 2. Pt sat on te EOB for 7 mins with min ot mod assist ot maintain sitting balance and to use RUE for support. Max of 2 to make pt comfortable in bed. Pt's medication likely to contribute to increased assistance to functional mobility at this point. Pt Deferred from OOB activities due to lethargy and weakness. PT will continue in acute and referred to mobility specialist. Pt will benefit form 5 x week PT after acute  care.       If plan is discharge home, recommend the following: Two people to help with walking and/or transfers;Two people to help with bathing/dressing/bathroom;Assistance with cooking/housework;Assistance with feeding;Direct supervision/assist for medications management;Assist for transportation;Help with stairs or ramp for entrance;Supervision due to cognitive status   Can travel by private vehicle   No    Equipment Recommendations None recommended by PT  Recommendations for Other Services       Functional Status Assessment       Precautions / Restrictions Precautions Precautions: Fall Restrictions Weight Bearing Restrictions Per Provider Order: No      Mobility  Bed Mobility Overal bed mobility: Needs Assistance Bed Mobility: Rolling, Supine to Sit, Sit to Supine Rolling: +2 for physical assistance, Max assist   Supine to sit: Max assist, +2 for physical assistance, HOB elevated Sit to supine: Max assist, +2 for physical assistance, HOB elevated   General bed mobility comments: sat on the EOB with LUE supported and Min asssit of 1 to maintian balance.    Transfers                   General transfer comment: deferred    Ambulation/Gait               General Gait Details: deferred  Stairs            Wheelchair Mobility     Tilt Bed    Modified Rankin (Stroke Patients Only)       Balance Overall balance assessment: Needs assistance Sitting-balance support: Single extremity supported Sitting balance-Leahy  Scale: Poor Sitting balance - Comments: Needs One person min assit to maintian sitting balance.Posterior Lean. Postural control: Posterior lean     Standing balance comment: unable                             Pertinent Vitals/Pain Pain Assessment Pain Assessment: No/denies pain    Home Living Family/patient expects to be discharged to:: Private residence Living Arrangements: Spouse/significant other Available  Help at Discharge: Family Type of Home: House Home Access: Ramped entrance       Home Layout: One level        Prior Function Prior Level of Function : Needs assist             Mobility Comments: Per chart and spouse, WC level as primary mobility; SPT between seating surfaces (intermittent use of HW). However pt bed bound since recent last admission. ADLs Comments: Per Spouse since last admission she needs 100% assitacne for ADls, cooking, eating, bathing.     Extremity/Trunk Assessment   Upper Extremity Assessment Upper Extremity Assessment: LUE deficits/detail RUE Deficits / Details: Old CVA    Lower Extremity Assessment Lower Extremity Assessment: LLE deficits/detail RLE Deficits / Details: old CVA  grossly 2/5       Communication   Communication Communication: Difficulty following commands/understanding Following commands: Follows one step commands consistently  Cognition Arousal: Lethargic Behavior During Therapy: Flat affect Overall Cognitive Status: Impaired/Different from baseline                                 General Comments: oriented to self, family only.        General Comments      Exercises     Assessment/Plan    PT Assessment Patient needs continued PT services  PT Problem List Decreased strength;Decreased activity tolerance;Decreased range of motion;Decreased balance;Decreased mobility;Decreased coordination;Decreased cognition;Decreased knowledge of use of DME;Decreased safety awareness;Decreased knowledge of precautions;Cardiopulmonary status limiting activity;Impaired sensation       PT Treatment Interventions DME instruction;Functional mobility training;Therapeutic activities;Therapeutic exercise;Balance training;Patient/family education    PT Goals (Current goals can be found in the Care Plan section)  Acute Rehab PT Goals Patient Stated Goal: Spouse would liek tp to get funciotnal at a Rehab facility. PT Goal  Formulation: Patient unable to participate in goal setting Time For Goal Achievement: 11/02/23 Potential to Achieve Goals: Fair    Frequency Min 1X/week     Co-evaluation               AM-PAC PT "6 Clicks" Mobility  Outcome Measure Help needed turning from your back to your side while in a flat bed without using bedrails?: A Lot Help needed moving from lying on your back to sitting on the side of a flat bed without using bedrails?: A Lot Help needed moving to and from a bed to a chair (including a wheelchair)?: Total Help needed standing up from a chair using your arms (e.g., wheelchair or bedside chair)?: Total Help needed to walk in hospital room?: Total Help needed climbing 3-5 steps with a railing? : Total 6 Click Score: 8    End of Session Equipment Utilized During Treatment: Oxygen Activity Tolerance: Patient limited by lethargy Patient left: in bed;with call bell/phone within reach;with bed alarm set;with family/visitor present Nurse Communication: Mobility status PT Visit Diagnosis: Muscle weakness (generalized) (M62.81);History of falling (Z91.81)    Time: 1610-9604  PT Time Calculation (min) (ACUTE ONLY): 29 min   Charges:   PT Evaluation $PT Eval Moderate Complexity: 1 Mod PT Treatments $Therapeutic Activity: 8-22 mins PT General Charges $$ ACUTE PT VISIT: 1 Visit          Janet Berlin PT DPT 12:04 PM,10/19/23

## 2023-10-20 DIAGNOSIS — I69354 Hemiplegia and hemiparesis following cerebral infarction affecting left non-dominant side: Secondary | ICD-10-CM | POA: Diagnosis not present

## 2023-10-20 DIAGNOSIS — T50995A Adverse effect of other drugs, medicaments and biological substances, initial encounter: Secondary | ICD-10-CM | POA: Diagnosis present

## 2023-10-20 DIAGNOSIS — G9340 Encephalopathy, unspecified: Secondary | ICD-10-CM | POA: Diagnosis present

## 2023-10-20 DIAGNOSIS — F05 Delirium due to known physiological condition: Secondary | ICD-10-CM | POA: Diagnosis not present

## 2023-10-20 DIAGNOSIS — I7782 Antineutrophilic cytoplasmic antibody (ANCA) vasculitis: Secondary | ICD-10-CM | POA: Diagnosis present

## 2023-10-20 DIAGNOSIS — F0393 Unspecified dementia, unspecified severity, with mood disturbance: Secondary | ICD-10-CM | POA: Diagnosis present

## 2023-10-20 DIAGNOSIS — M4802 Spinal stenosis, cervical region: Secondary | ICD-10-CM | POA: Diagnosis present

## 2023-10-20 DIAGNOSIS — Z1152 Encounter for screening for COVID-19: Secondary | ICD-10-CM | POA: Diagnosis not present

## 2023-10-20 DIAGNOSIS — E039 Hypothyroidism, unspecified: Secondary | ICD-10-CM | POA: Diagnosis present

## 2023-10-20 DIAGNOSIS — F32A Depression, unspecified: Secondary | ICD-10-CM | POA: Diagnosis present

## 2023-10-20 DIAGNOSIS — Z811 Family history of alcohol abuse and dependence: Secondary | ICD-10-CM | POA: Diagnosis not present

## 2023-10-20 DIAGNOSIS — Z8616 Personal history of COVID-19: Secondary | ICD-10-CM | POA: Diagnosis not present

## 2023-10-20 DIAGNOSIS — E66813 Obesity, class 3: Secondary | ICD-10-CM | POA: Diagnosis present

## 2023-10-20 DIAGNOSIS — E1142 Type 2 diabetes mellitus with diabetic polyneuropathy: Secondary | ICD-10-CM | POA: Diagnosis present

## 2023-10-20 DIAGNOSIS — E785 Hyperlipidemia, unspecified: Secondary | ICD-10-CM | POA: Diagnosis present

## 2023-10-20 DIAGNOSIS — Z818 Family history of other mental and behavioral disorders: Secondary | ICD-10-CM | POA: Diagnosis not present

## 2023-10-20 DIAGNOSIS — M069 Rheumatoid arthritis, unspecified: Secondary | ICD-10-CM | POA: Diagnosis present

## 2023-10-20 DIAGNOSIS — Z87891 Personal history of nicotine dependence: Secondary | ICD-10-CM | POA: Diagnosis not present

## 2023-10-20 DIAGNOSIS — Z823 Family history of stroke: Secondary | ICD-10-CM | POA: Diagnosis not present

## 2023-10-20 DIAGNOSIS — Z7901 Long term (current) use of anticoagulants: Secondary | ICD-10-CM | POA: Diagnosis not present

## 2023-10-20 DIAGNOSIS — G929 Unspecified toxic encephalopathy: Secondary | ICD-10-CM | POA: Diagnosis present

## 2023-10-20 DIAGNOSIS — Z79890 Hormone replacement therapy (postmenopausal): Secondary | ICD-10-CM | POA: Diagnosis not present

## 2023-10-20 DIAGNOSIS — Z6841 Body Mass Index (BMI) 40.0 and over, adult: Secondary | ICD-10-CM | POA: Diagnosis not present

## 2023-10-20 DIAGNOSIS — F0394 Unspecified dementia, unspecified severity, with anxiety: Secondary | ICD-10-CM | POA: Diagnosis present

## 2023-10-20 LAB — GLUCOSE, CAPILLARY
Glucose-Capillary: 105 mg/dL — ABNORMAL HIGH (ref 70–99)
Glucose-Capillary: 120 mg/dL — ABNORMAL HIGH (ref 70–99)
Glucose-Capillary: 105 mg/dL — ABNORMAL HIGH (ref 70–99)
Glucose-Capillary: 113 mg/dL — ABNORMAL HIGH (ref 70–99)

## 2023-10-20 NOTE — Progress Notes (Signed)
Progress Note   Patient: Ariel Alvarez QMV:[id number] DOB: 04-30-1946 DOA: 10/16/2023     0 DOS: the patient was seen and examined on 10/20/2023   Brief hospital course: Ariel Alvarez is a 77 y.o. female with medical history significant for anxiety, morbid obesity, collagen vascular disease, vasculitis, depression, GERD, Hx DVT on Eliquis, hypothyroidism and CVA with spastic left-sided hemiplegia, recently hospitalized from 12/3 to 10/05/23 with acute metabolic encephalopathy that was attributed to polypharmacy, ruled out for acute infection, who presents to the ED for altered mental status, agitation and muscle spasms.  She is unable to contribute to the history.  Patient was seen in the ED the day prior with muscle spasms and was prescribed baclofen and discharge.  Since arrival in the ED she has been very agitated and hitting out, requiring Haldol for sedation. ED course and data review: Vitals within normal limits. Workup including BMP, CBC, hepatic function panel and urinalysis all unremarkable. Head CT showed remote infarcts, chest x-ray nonacute and left upper extremity venous Doppler negative for DVT   Patient treated with Ativan and Haldol Hospitalist consulted for admission.  At the time of my evaluation patient very somnolent due to medication administered in the ED and unable to contribute to history.  She did ask when awoken if I could send her home back to her husband.   Assessment and Plan:  * Acute encephalopathy Dementia Rule out delirium Possible medication side effects versus withdrawal Possible underlying dementia/chronic encephalopathy History of similar episode several months ago related to polypharmacy treated with Narcan earlier this month 12/3.   Prescribed baclofen in the ED on 12/17 (Patient previously on baclofen, Pristiq, Seroquel, Klonopin, gabapentin--med list not current) Acute infection not suspected.  Acute encephalopathy thought to be medication  induced in a patient with presumed underlying dementia Appreciate neurology input, concern for possible dementia but will rule out CNS vasculitis with an MRI of the brain. MRI of the brain/cervical spine showed no acute intracranial process. No evidence of vasculitis. Mild-to-moderate spinal canal stenosis and mild bilateral neural foraminal narrowing at C5-C6 and C6-C7. Mild right neural foraminal narrowing at C3-C4 and mild left neural foraminal narrowing at C4-C5. EEG is suggestive of severe diffuse encephalopathy. No seizures or epileptiform discharges were seen throughout the recording.  Discussed with neurologist  "Further reduce baclofen to 5 mg BID x 3 days, then 5 mg at bedtime x 3 days, then STOP Consider PT referral for spasms or lidocaine gel or other topicals if these recur; at this time she is comfortable Reversible causes of dementia labs reassuring to date, thiamine still pending Outpatient neurocognitive testing" Patient is awake and oriented only to person but is at increased risk for falls.  Continues to have behavioral issues mostly at night when she is agitated and calling out. Continue fall precautions Continue Haldol, Ativan as needed for agitation.   Appreciate psych input     Obesity, Class III, BMI 40-49.9 (morbid obesity) (HCC) Complicating factor to overall prognosis and care       Dyslipidemia Continue statin     Hypothyroidism Continue Synthroid.     Collagen vascular disease (HCC) History of rheumatoid arthritis Plaquenil, folic acid.     Depression/Anxiety Effexor XR. Holding Klonopin  Continue trazodone prn insomnia      Type 2 diabetes mellitus with peripheral neuropathy (HCC) Continue consistent carbohydrate diet Sliding scale insulin coverage Holding Neurontin.     History of DVT (deep vein thrombosis) history of PE. Continue Eliquis.  Spastic hemiplegia affecting left nondominant side [facility]) Appreciate neurology input.   Patient currently on the baclofen taper Recommends PT referral or topicals if symptoms recur       Physical deconditioning Appreciate PT input.  Pt is max of 2+ with bed mobility. Lives alone with her spouse/caregiver who is burned out and at risk for injuries  PT recommends SNF on discharge [facility] consult for SNF placement       UTI ruled out Vital signs are stable Asymptomatic Urine culture yields  Staphylococcus hemolyticus which appears to be a contaminant Will monitor patient closely for symptoms      Subjective: Seen and examined at the bedside.  Sleepy but arousable.  Events of last night noted, patient agitated and yelling out all through the night.  Physical Exam: Vitals:   10/19/23 1534 10/19/23 2113 10/20/23 0638 10/20/23 0716  BP: (!) 142/72 (!) 150/69 136/60 (!) 163/76  Pulse: 88 85 85 87  Resp: 18 19 18 18   Temp: 97.6 F (36.4 C) 98.4 F (36.9 C) 97.9 F (36.6 C) 97.9 F (36.6 C)  TempSrc: Oral Oral Oral Oral  SpO2: 97% 96% 95% 96%  Weight:      Height:       Vitals and nursing note reviewed.  Constitutional:      General: Awake, oriented to person and place    Comments:  HENT:     Head: Normocephalic and atraumatic.  Cardiovascular:     Rate and Rhythm: Normal rate and regular rhythm.     Heart sounds: Normal heart sounds.  Pulmonary:     Effort: Pulmonary effort is normal.     Breath sounds: Normal breath sounds.  Abdominal:     Palpations: Abdomen is soft.     Tenderness: There is no abdominal tenderness.  Neurological:     General: Generalized weakness    Oriented to person and place    Data Reviewed:  There are no new results to review at this time.  Family Communication: Plan of care discussed with patient's husband over the phone.  Awaiting placement  Disposition: Status is: Inpatient Remains inpatient appropriate because: Awaiting placement  Planned Discharge Destination: Skilled nursing facility    Time spent: 33  minutes  Author: Lucile Shutters, MD 10/20/2023 11:46 AM  For on call review [email or web address].

## 2023-10-20 NOTE — Plan of Care (Addendum)
Patient is alert and oriented X 2.patient yelling multiple times, but helped her to redirect. Denies any pain. Tele sitter at bedside.  Problem: Education: Goal: Ability to describe self-care measures that may prevent or decrease complications (Diabetes Survival Skills Education) will improve Outcome: Progressing Goal: Individualized Educational Video(s) Outcome: Progressing   Problem: Coping: Goal: Ability to adjust to condition or change in health will improve Outcome: Progressing   Problem: Fluid Volume: Goal: Ability to maintain a balanced intake and output will improve Outcome: Progressing   Problem: Health Behavior/Discharge Planning: Goal: Ability to identify and utilize available resources and services will improve Outcome: Progressing Goal: Ability to manage health-related needs will improve Outcome: Progressing   Problem: Metabolic: Goal: Ability to maintain appropriate glucose levels will improve Outcome: Progressing   Problem: Nutritional: Goal: Maintenance of adequate nutrition will improve Outcome: Progressing Goal: Progress toward achieving an optimal weight will improve Outcome: Progressing   Problem: Skin Integrity: Goal: Risk for impaired skin integrity will decrease Outcome: Progressing   Problem: Tissue Perfusion: Goal: Adequacy of tissue perfusion will improve Outcome: Progressing   Problem: Education: Goal: Knowledge of General Education information will improve Description: Including pain rating scale, medication(s)/side effects and non-pharmacologic comfort measures Outcome: Progressing   Problem: Health Behavior/Discharge Planning: Goal: Ability to manage health-related needs will improve Outcome: Progressing   Problem: Clinical Measurements: Goal: Ability to maintain clinical measurements within normal limits will improve Outcome: Progressing Goal: Will remain free from infection Outcome: Progressing Goal: Diagnostic test results will  improve Outcome: Progressing Goal: Respiratory complications will improve Outcome: Progressing Goal: Cardiovascular complication will be avoided Outcome: Progressing   Problem: Activity: Goal: Risk for activity intolerance will decrease Outcome: Progressing   Problem: Nutrition: Goal: Adequate nutrition will be maintained Outcome: Progressing   Problem: Coping: Goal: Level of anxiety will decrease Outcome: Progressing   Problem: Elimination: Goal: Will not experience complications related to bowel motility Outcome: Progressing Goal: Will not experience complications related to urinary retention Outcome: Progressing   Problem: Pain Management: Goal: General experience of comfort will improve Outcome: Progressing   Problem: Safety: Goal: Ability to remain free from injury will improve Outcome: Progressing   Problem: Skin Integrity: Goal: Risk for impaired skin integrity will decrease Outcome: Progressing

## 2023-10-20 NOTE — Progress Notes (Signed)
Patient yelling out multiple times for a nurse and becomes verbally aggressive when staff enters room. RN/NT have attempted to reposition and redirect patient multiple times. PRN klonopin/haldol have been given as ordered through night. Patient continues to yell out for staff. RN will continue to monitor and redirect patient as needed. Patient is in bed, bed alarm, call light with in patient reach, tele sitter on.

## 2023-10-20 NOTE — TOC Progression Note (Signed)
Transition of Care [facility]) - Progression Note    Patient Details  Name: Ariel Alvarez MRN: [id number] Date of Birth: 1945-12-07  Transition of Care [facility]) CM/SW Contact  Hetty Ely, RN Phone Number: 10/20/2023, 9:08 AM  Clinical Narrative:  Awaiting bed offers.    Expected Discharge Plan: Skilled Nursing Facility    Expected Discharge Plan and Services                                               Social Determinants of Health (SDOH) Interventions SDOH Screenings   Food Insecurity: Patient Unable To Answer (10/17/2023)  Housing: Patient Unable To Answer (10/17/2023)  Transportation Needs: Patient Unable To Answer (10/17/2023)  Utilities: Patient Unable To Answer (10/17/2023)  Depression (PHQ2-9): High Risk (10/03/2021)  Financial Resource Strain: Medium Risk (10/16/2019)   Received from [facility], [facility]  Physical Activity: Inactive (10/16/2019)   Received from [facility], [facility]  Tobacco Use: Medium Risk (10/16/2023)  Health Literacy: Low Risk  (02/04/2021)   Received from [facility], [facility]    Readmission Risk Interventions     No data to display

## 2023-10-20 NOTE — Plan of Care (Signed)

## 2023-10-20 NOTE — Progress Notes (Addendum)
Patient agitated, yelling out, restless. Attempted to redirect and reposition patient w/ no success. PRN haldol given.

## 2023-10-21 DIAGNOSIS — G9340 Encephalopathy, unspecified: Secondary | ICD-10-CM | POA: Diagnosis not present

## 2023-10-21 LAB — GLUCOSE, CAPILLARY
Glucose-Capillary: 84 mg/dL (ref 70–99)
Glucose-Capillary: 101 mg/dL — ABNORMAL HIGH (ref 70–99)
Glucose-Capillary: 83 mg/dL (ref 70–99)

## 2023-10-21 NOTE — Progress Notes (Signed)
Patient appears anxious/restless, calling out for staff multiple times, attempted to redirect patient. Patient reports anxiety, Prn klonopin given.

## 2023-10-21 NOTE — Progress Notes (Addendum)
Progress Note   Patient: Ariel Alvarez NFA:[id number] DOB: 1946-03-05 DOA: 10/16/2023     1 DOS: the patient was seen and examined on 10/21/2023   Brief hospital course: Ariel Alvarez is a 77 y.o. female with medical history significant for anxiety, morbid obesity, collagen vascular disease, vasculitis, depression, GERD, Hx DVT on Eliquis, hypothyroidism and CVA with spastic left-sided hemiplegia, recently hospitalized from 12/3 to 10/05/23 with acute metabolic encephalopathy that was attributed to polypharmacy, ruled out for acute infection, who presents to the ED for altered mental status, agitation and muscle spasms.  She is unable to contribute to the history.  Patient was seen in the ED the day prior with muscle spasms and was prescribed baclofen and discharge.  Since arrival in the ED she has been very agitated and hitting out, requiring Haldol for sedation. ED course and data review: Vitals within normal limits. Workup including BMP, CBC, hepatic function panel and urinalysis all unremarkable. Head CT showed remote infarcts, chest x-ray nonacute and left upper extremity venous Doppler negative for DVT   Patient treated with Ativan and Haldol Hospitalist consulted for admission.  At the time of my evaluation patient very somnolent due to medication administered in the ED and unable to contribute to history.  She did ask when awoken if I could send her home back to her husband.   Assessment and Plan:  * Acute encephalopathy Dementia Rule out delirium Possible medication side effects versus withdrawal Possible underlying dementia/chronic encephalopathy History of similar episode several months ago related to polypharmacy treated with Narcan earlier this month 12/3.   Prescribed baclofen in the ED on 12/17 (Patient previously on baclofen, Pristiq, Seroquel, Klonopin, gabapentin--med list not current) Acute infection not suspected.  Acute encephalopathy thought to be medication  induced in a patient with presumed underlying dementia Appreciate neurology input, concern for possible dementia but will rule out CNS vasculitis with an MRI of the brain. MRI of the brain/cervical spine showed no acute intracranial process. No evidence of vasculitis. Mild-to-moderate spinal canal stenosis and mild bilateral neural foraminal narrowing at C5-C6 and C6-C7. Mild right neural foraminal narrowing at C3-C4 and mild left neural foraminal narrowing at C4-C5. EEG is suggestive of severe diffuse encephalopathy. No seizures or epileptiform discharges were seen throughout the recording.  Discussed with neurologist  "Further reduce baclofen to 5 mg BID x 3 days, then 5 mg at bedtime x 3 days, then STOP Consider PT referral for spasms or lidocaine gel or other topicals if these recur; at this time she is comfortable Reversible causes of dementia labs reassuring to date, thiamine still pending Outpatient neurocognitive testing" Patient is awake and oriented only to person but is at increased risk for falls.  Continues to have behavioral issues mostly at night when she is agitated and calling out.  During the day she is calm and cooperative.  Concern for possible sundowning Continue fall precautions Continue Haldol, Klonopin as needed for agitation.   Appreciate psych input    Obesity, Class III, BMI 40-49.9 (morbid obesity) (HCC) Complicating factor to overall prognosis and care       Dyslipidemia Continue statin     Hypothyroidism Continue Synthroid.     Collagen vascular disease (HCC) History of rheumatoid arthritis Plaquenil, folic acid.     Depression/Anxiety Effexor XR. Holding Klonopin  Continue trazodone prn insomnia      Type 2 diabetes mellitus with peripheral neuropathy (HCC) Continue consistent carbohydrate diet Sliding scale insulin coverage Blood sugars are stable Holding Neurontin.  History of DVT (deep vein thrombosis) history of PE. Continue  Eliquis.     Spastic hemiplegia affecting left nondominant side [facility]) Appreciate neurology input.  Patient currently on the baclofen taper Recommends PT referral or topicals if symptoms recur       Physical deconditioning Appreciate PT input.  Pt is max of 2+ with bed mobility. Lives alone with her spouse/caregiver who is burned out and at risk for injuries  PT recommends SNF on discharge [facility] consult for SNF placement           Subjective: Appears calm and cooperative.  Events of last night noted  Physical Exam: Vitals:   10/20/23 1517 10/20/23 2014 10/21/23 0452 10/21/23 0754  BP: (!) 157/75 132/65 135/66 120/64  Pulse: 89 85 82 74  Resp: 18 18 18 19   Temp: 98.3 F (36.8 C) 97.6 F (36.4 C) 97.8 F (36.6 C) 97.9 F (36.6 C)  TempSrc:      SpO2: 95% 96% 95% 90%  Weight:      Height:        Vitals and nursing note reviewed.  Constitutional:      General: Awake, oriented to person and place    Comments:  HENT:     Head: Normocephalic and atraumatic.  Cardiovascular:     Rate and Rhythm: Normal rate and regular rhythm.     Heart sounds: Normal heart sounds.  Pulmonary:     Effort: Pulmonary effort is normal.     Breath sounds: Normal breath sounds.  Abdominal:     Palpations: Abdomen is soft.     Tenderness: There is no abdominal tenderness.  Neurological:     General: Generalized weakness    Oriented to person and place      Data Reviewed:  There are no new results to review at this time.  Family Communication: Called and updated patient's husband over the phone  Disposition: Status is: Inpatient Remains inpatient appropriate because: Awaiting discharge  Planned Discharge Destination: Skilled nursing facility    Time spent: 33 minutes  Author: Lucile Shutters, MD 10/21/2023 11:40 AM  For on call review [email or web address].

## 2023-10-21 NOTE — Consult Note (Signed)
[facility] Consult Follow-up  Patient Name: .Ariel Alvarez  MRN: [id number]  DOB: 1946-07-14  Consult Order details:  Orders (From admission, onward)     Start     Ordered   10/20/23 1353  IP CONSULT TO PSYCHIATRY       Ordering Provider: Charm Rings, NP  Provider:  (Not yet assigned)  Question Answer Comment  Location [facility]   Reason for Consult? agitation      10/20/23 1352   10/19/23 0739  IP CONSULT TO PSYCHIATRY       Ordering Provider: Lucile Shutters, MD  Provider:  (Not yet assigned)  Question Answer Comment  Location [facility]   Reason for Consult? Agitation      10/19/23 0738             Mode of Visit: In person    Psychiatry Consult Evaluation  Service Date: October 21, 2023 LOS:  LOS: 1 day  Chief Complaint Agitation/Anxiety  Primary Psychiatric Diagnoses  Delirium  Assessment  Ariel Alvarez is a 77 y.o. female admitted: Medically for 10/16/2023  6:30 PM for Altered Mental Status. She carries the psychiatric diagnoses of Anxiety and has a past medical history of  morbid obesity, collagen vascular disease, vasculitis, depression, GERD, DVT, hypothyroidism and CVA.   Consult Initial from Nanine Means, NP on 12/21/224: Her current presentation of agitation at times related to medical issues is most consistent with delirium. She meets criteria for delirium based on fluctuating attention, mood changes, agitation and difficulty following conversations; significant change from her baseline per her spouse.  Current outpatient psychotropic medications include Klonopin 0.5 mg TID and historically she has had a positive response to these medications but ONLY takes it as needed by her husband. She did not have a UDS on admission to confirm compliance.  Per the PDMP, she did receive a Rx for Klonopin 0.5 mg TID "filled on 12/17" and written by Christianne Borrow, MD. On initial examination, patient is  laying in bed on her cell phone with no one online, trying to contact her husband. She is alert and oriented to self only but she has trouble remembering where she is and what day of the week it is. She reported feeling "ok" and denied depression. Patient also denied homicidal/suicidal ideations as well as auditory/visual hallucinations. She endorsed getting adequate sleep and proper nutrition. Please see plan below for detailed recommendations.  Due to her cognitive stated, her husband was contacted for collateral information.  He reported she was doing well before Covid and was going to the senior activity center during the week and "they just loved her".  When she returned there after Covid, she was irritable and he was told to come and get her.  The facility would not allow her to return, she seemed to have a "total personality change".  He described sundowning symptoms of an increase in irritability and mood changes at night with multiple demands for things at bedtime.  The PCP concerned about dementia diagnosis, no full evaluation by a neurologist.  [name] stated she does take Klonopin 0.5 mg TID but ONLY as needed when she is agitated at home.  "It helps but takes awhile when she gets in those states." Her husband has been to visit regularly and this is not he baseline.     12/23 Consult Note:  I spent 30 min on the in person meeting with patient.   Based on the previous note,  after reviewing the nurses note, vitals, and medication. The previous plan is encouraged to continue as the medication was only used on 12/22 after recommendation of treatment plan was recommended.  During the in person visit, pt was in her room with her family member, in which she appeared to have recently woken up. According to RN report, pt stayed up until 4am and was agitated in which she was given the PRN of klonopin.  The RN reports she has been sleeping since 4am.  The pt is calm and oriented to self. She continually  asked to be sent home and to get up as she is ready to go.  The family member reported this behavior is typical whenever she goes to the hospital, and asked to help manage her anxiety.   Pt is denying SI/HI/AVH. We will follow-up tomorrow to evaluate medication therapy for agitation.  Please see plan below foe recommendations.   Diagnoses:  Active Hospital problems: Principal Problem:   Acute encephalopathy Active Problems:   Spastic hemiplegia affecting nondominant side (HCC)   Obesity, Class III, BMI 40-49.9 (morbid obesity) (HCC)   Hypothyroidism   Depressive disorder   History of pulmonary embolism   Chronic anticoagulation   Type 2 diabetes mellitus with peripheral neuropathy (HCC)   Delirium, drug-induced    Plan   ## Psychiatric Medication Recommendations:  Klonopin 0.5 mg BID PRN   ## Medical Decision Making Capacity: Not specifically addressed in this encounter   ## Further Work-up:  -- Continue Baclofen taper to discontinuation as this medication could be triggering her current symptoms especially with her outpatient gabapentin (not confirmed that she was taking this) UDS   ## Disposition:-- Plan Post Discharge/Psychiatric Care Follow-up resources :  Follow up with neurology for a cognitive baseline /evaluation   ## Behavioral / Environmental: -Delirium Precautions: Delirium Interventions for Nursing and Staff: - RN to open blinds every AM. - To Bedside: Glasses, hearing aide, and pt's own shoes. Make available to patients. when possible and encourage use. - Encourage po fluids when appropriate, keep fluids within reach. - OOB to chair with meals. - Passive ROM exercises to all extremities with AM & PM care. - RN to assess orientation to person, time and place QAM and PRN. - Recommend extended visitation hours with familiar family/friends as feasible. - Staff to minimize disturbances at night. Turn off television when pt asleep or when not in use.                ## Safety  and Observation Level:  - Based on my clinical evaluation, I estimate the patient to be at low risk of self harm in the current setting. - A   CSSR Risk Category:C-SSRS RISK CATEGORY: No Risk   Suicide Risk Assessment: Patient has following modifiable risk factors for suicide: NOne Patient has following non-modifiable or demographic risk factors for suicide: NOne Patient has the following protective factors against suicide: Supportive family, no history of suicide attempts, and no history of NSSIB   Thank you for this consult request. Recommendations have been communicated to the primary team.  We will see her when needed at this time.   Juliann Pares, NP       History of Present Illness  Relevant Aspects of [facility] Course:  Admitted on 10/16/2023 for AMS. They admitted her medically with a baclofen taper to discontinuation as it appears her symptoms are due to medications.    Patient Report:  Denies depression and anxiety, yet confused  on assessment and focused on calling "[name]" her husband, helped her call after the assessment.   Psych ROS:  Depression: denied Anxiety:  denied Mania (lifetime and current): none Psychosis: (lifetime and current): none   Collateral information:  In person, [name] at @ 1000 am on 12/23 in patient room.   Review of Systems  All other systems reviewed and are negative.  Psychiatric and Social History  Psychiatric History:  Information collected from patient, chart, and husband   Prev Dx/Sx: anxiety, depression Current Psych Provider: none Home Meds (current): Klonopin 0.5 mg TID PRN Previous Med Trials: Pristiq and Seroquel Therapy: None   Prior Psych Hospitalization: None  Prior Self Harm: None Prior Violence: None   Family Psych History: see be;pw Family Hx suicide: see below   Social History:  Living Situation: lives with her husband Access to weapons/lethal means: none    Substance History Alcohol: none   Tobacco: none Illicit drugs: none Prescription drug abuse: unsure Rehab hx: none Exam Findings  Physical Exam: See MD Note Vital Signs:  Temp:  [97.6 F (36.4 C)-98.3 F (36.8 C)] 97.9 F (36.6 C) (12/23 0754) Pulse Rate:  [74-89] 74 (12/23 0754) Resp:  [18-19] 19 (12/23 0754) BP: (120-157)/(64-75) 120/64 (12/23 0754) SpO2:  [90 %-96 %] 90 % (12/23 0754) Blood pressure 120/64, pulse 74, temperature 97.9 F (36.6 C), resp. rate 19, height 5\' 3"  (1.6 m), weight 118.8 kg, SpO2 90%. Body mass index is 46.41 kg/m.  Physical Exam  Mental Status Exam: General Appearance: Casual  Orientation:  Other:  oriented to self   Memory:  Immediate;   Poor  Concentration:  Concentration: Poor and Attention Span: Poor  Recall:  Fair  Attention  Poor  Eye Contact:  Poor  Speech:  Clear and Coherent  Language:  Fair  Volume:  Normal  Mood: "feeling pretty good"  Affect:  Congruent  Thought Process:  Linear  Thought Content:  WDL  Suicidal Thoughts:  No  Homicidal Thoughts:  No  Judgement:  Fair  Insight:  Fair  Psychomotor Activity:  Normal  Akathisia:  No  Fund of Knowledge:  Fair       Assets:  Social Support  Cognition: Impaired  ADL's:  Impaired  AIMS (if indicated):        Other History   These have been pulled in through the EMR, reviewed, and updated if appropriate.  Family History:  The patient's family history includes Alcohol abuse in her father; Cancer in her paternal aunt; Depression in her father; Diabetes in her son; Stroke in her paternal grandmother; Suicidality in her father.  Medical History: Past Medical History:  Diagnosis Date   Anxiety    Arthritis    RIGHT HAND   Collagen vascular disease (HCC)    Complication of anesthesia    HARD TO WAKE UP AFTER  ONE SURGERY-PT STAETS SHE WAS GIVEN TOO MUCH ANESTHESIA   Depression    Dyspnea    VERY RARE WITH EXERTION   GERD (gastroesophageal reflux disease)    History of kidney stones    H/O    Hypothyroidism    Stroke (HCC) 2130,8657   X2-LEFT HAD PARALYZED     Surgical History: Past Surgical History:  Procedure Laterality Date   CHOLECYSTECTOMY     KIDNEY STONE SURGERY     METATARSAL HEAD EXCISION Bilateral 04/15/2020   Procedure: METATARSAL HEAD PARTIAL EXCISION - BILATERAL;  Surgeon: Linus Galas, DPM;  Location: [facility] ORS;  Service: Podiatry;  Laterality: Bilateral;  Medications:   Current Facility-Administered Medications:    acetaminophen (TYLENOL) tablet 650 mg, 650 mg, Oral, Q6H PRN, 650 mg at 10/21/23 1000 **OR** acetaminophen (TYLENOL) suppository 650 mg, 650 mg, Rectal, Q6H PRN, Andris Baumann, MD   apixaban Everlene Balls) tablet 5 mg, 5 mg, Oral, BID, Lindajo Royal V, MD, 5 mg at 10/21/23 5573   aspirin EC tablet 81 mg, 81 mg, Oral, Daily, Lindajo Royal V, MD, 81 mg at 10/21/23 0942   atorvastatin (LIPITOR) tablet 40 mg, 40 mg, Oral, BH-q7a, Duncan, Jerrye Beavers V, MD, 40 mg at 10/21/23 0608   [COMPLETED] baclofen (LIORESAL) tablet 5 mg, 5 mg, Oral, BID, 5 mg at 10/20/23 0807 **FOLLOWED BY** baclofen (LIORESAL) tablet 5 mg, 5 mg, Oral, QHS, Bhagat, Srishti L, MD, 5 mg at 10/20/23 2224   clonazePAM (KLONOPIN) tablet 0.5 mg, 0.5 mg, Oral, BID PRN, Charm Rings, NP, 0.5 mg at 10/21/23 0401   folic acid (FOLVITE) tablet 1 mg, 1 mg, Oral, Daily, Lindajo Royal V, MD, 1 mg at 10/21/23 2202   haloperidol lactate (HALDOL) injection 1 mg, 1 mg, Intravenous, Q6H PRN, Agbata, Tochukwu, MD, 1 mg at 10/20/23 0036   hydroxychloroquine (PLAQUENIL) tablet 200 mg, 200 mg, Oral, Daily, Lindajo Royal V, MD, 200 mg at 10/21/23 0942   insulin aspart (novoLOG) injection 0-15 Units, 0-15 Units, Subcutaneous, TID WC, Lindajo Royal V, MD, 2 Units at 10/19/23 1656   insulin aspart (novoLOG) injection 0-5 Units, 0-5 Units, Subcutaneous, QHS, Andris Baumann, MD   levothyroxine (SYNTHROID) tablet 100 mcg, 100 mcg, Oral, Q0600, Lindajo Royal V, MD, 100 mcg at 10/21/23 0608   [START ON 10/23/2023]  methotrexate (RHEUMATREX) tablet 17.5 mg, 17.5 mg, Oral, Q Wed, Duncan, Hazel V, MD   ondansetron [facility]) tablet 4 mg, 4 mg, Oral, Q6H PRN, 4 mg at 10/17/23 0119 **OR** ondansetron (ZOFRAN) injection 4 mg, 4 mg, Intravenous, Q6H PRN, Andris Baumann, MD   pantoprazole (PROTONIX) EC tablet 40 mg, 40 mg, Oral, Daily, Lindajo Royal V, MD, 40 mg at 10/21/23 5427  Allergies: Allergies  Allergen Reactions   Latex Hives   Prednisone Nausea And Vomiting    Pt states causes N/V even when taken with food.    Juliann Pares, NP

## 2023-10-21 NOTE — TOC Progression Note (Signed)
Transition of Care [facility]) - Progression Note    Patient Details  Name: Ariel Alvarez MRN: [id number] Date of Birth: 1946-01-19  [facility] of [facility] CM/SW Contact  Allena Katz, [name] Phone Number: 10/21/2023, 3:20 PM  Clinical Narrative:   CSW spoke with pt's spouse about bed offers. Spouse appeared to be overwhelmed stating he now wants to take her home. He asked me to arrange ems to get her home today. I explained to him she is unable to walk and is a two person assist. He states that is fine and we can just call ems. CSW offered a hoyer lift and hospital bed. He declined stating that she has a lift chair. CSW offered HH. He does want HH. He does not have a preference. He states pt has a Biomedical scientist and walker at home. CSW shared with MD. MD reports she is not medically ready and she will discuss with husband.   Expected Discharge Plan: Skilled Nursing Facility    Expected Discharge Plan and Services                                               Social Determinants of Health (SDOH) Interventions SDOH Screenings   Food Insecurity: Patient Unable To Answer (10/17/2023)  Housing: Patient Unable To Answer (10/17/2023)  Transportation Needs: Patient Unable To Answer (10/17/2023)  Utilities: Patient Unable To Answer (10/17/2023)  Depression (PHQ2-9): High Risk (10/03/2021)  Financial Resource Strain: Medium Risk (10/16/2019)   Received from [facility], [facility]  Physical Activity: Inactive (10/16/2019)   Received from [facility], [facility]  Tobacco Use: Medium Risk (10/16/2023)  Health Literacy: Low Risk  (02/04/2021)   Received from [facility], [facility]    Readmission Risk Interventions     No data to display

## 2023-10-21 NOTE — Progress Notes (Signed)
1415 Pt has been calm and cooperative most of morning and afternoon. Not needed any PRN meds for behavior so far. RN and PT stood pt at bedside with 2+ max assist for only a few seconds. Pt could not bear weight by self.

## 2023-10-21 NOTE — Discharge Summary (Addendum)
Physician Discharge Summary   Patient: Ariel Alvarez MRN: [id number] DOB: Feb 27, 1946  Admit date:     10/16/2023  Discharge date: 10/21/23  Discharge Physician: Edison Nicholson   PCP: Pcp, No   Recommendations at discharge:   Fall precautions Follow-up with primary care provider  Discharge Diagnoses: Principal Problem:   Acute encephalopathy Active Problems:   Hypothyroidism   History of pulmonary embolism   Chronic anticoagulation   Type 2 diabetes mellitus with peripheral neuropathy (HCC)   Spastic hemiplegia affecting nondominant side (HCC)   Obesity, Class III, BMI 40-49.9 (morbid obesity) (HCC)   Depressive disorder   Delirium, drug-induced  Resolved Problems:   * No resolved hospital problems. *  Hospital Course: Ariel Alvarez is a 77 y.o. female with medical history significant for anxiety, morbid obesity, collagen vascular disease, vasculitis, depression, GERD, Hx DVT on Eliquis, hypothyroidism and CVA with spastic left-sided hemiplegia, recently hospitalized from 12/3 to 10/05/23 with acute metabolic encephalopathy that was attributed to polypharmacy, ruled out for acute infection, who presents to the ED for altered mental status, agitation and muscle spasms.  She is unable to contribute to the history.  Patient was seen in the ED the day prior with muscle spasms and was prescribed baclofen and discharge.  Since arrival in the ED she has been very agitated and hitting out, requiring Haldol for sedation. ED course and data review: Vitals within normal limits. Workup including BMP, CBC, hepatic function panel and urinalysis all unremarkable. Head CT showed remote infarcts, chest x-ray nonacute and left upper extremity venous Doppler negative for DVT   Patient treated with Ativan and Haldol Hospitalist consulted for admission.  At the time of my evaluation patient very somnolent due to medication administered in the ED and unable to contribute to history.  She did  ask when awoken if I could send her home back to her husband.       Assessment and Plan:   * Acute encephalopathy (toxic encephalopathy) Dementia Rule out delirium Possible medication side effects versus withdrawal Possible underlying dementia/chronic encephalopathy History of similar episode several months ago related to polypharmacy treated with Narcan earlier this month 12/3.   Prescribed baclofen in the ED on 12/17 (Patient previously on baclofen, Pristiq, Seroquel, Klonopin, gabapentin--med list not current) Acute infection not suspected.  Acute encephalopathy thought to be medication induced in a patient with presumed underlying dementia Appreciate neurology input, concern for possible dementia but will rule out CNS vasculitis with an MRI of the brain. MRI of the brain/cervical spine showed no acute intracranial process. No evidence of vasculitis. Mild-to-moderate spinal canal stenosis and mild bilateral neural foraminal narrowing at C5-C6 and C6-C7. Mild right neural foraminal narrowing at C3-C4 and mild left neural foraminal narrowing at C4-C5. EEG is suggestive of severe diffuse encephalopathy. No seizures or epileptiform discharges were seen throughout the recording.  Discussed with neurologist  "Further reduce baclofen to 5 mg BID x 3 days, then 5 mg at bedtime x 3 days, then STOP Consider PT referral for spasms or lidocaine gel or other topicals if these recur; at this time she is comfortable Reversible causes of dementia labs reassuring to date, thiamine still pending Outpatient neurocognitive testing" Patient is awake and oriented only to person but is at increased risk for falls.  Continues to have behavioral issues mostly at night when she is agitated and calling out.  During the day she is calm and cooperative.  Concern for possible sundowning Continue fall precautions Continue Haldol, Klonopin as  needed for agitation.   Appreciate psych input 12/23.  Informed by social  worker that the patient's husband wants to take her home.  Met and spoke with patient's husband in detail at the bedside.  He does not want her to go to skilled nursing facility and understands that she is a high fall risk.  He states that he has everything at home that he needs to provide care for her and would rather take her home.     Obesity, Class III, BMI 40-49.9 (morbid obesity) (HCC) Complicating factor to overall prognosis and care       Dyslipidemia Continue statin     Hypothyroidism Continue Synthroid.     Collagen vascular disease (HCC) History of rheumatoid arthritis Plaquenil, folic acid.     Depression/Anxiety Effexor XR. Holding Klonopin  Continue trazodone prn insomnia      Type 2 diabetes mellitus with peripheral neuropathy (HCC) Continue consistent carbohydrate diet Continue Mounjaro weekly     History of DVT (deep vein thrombosis) history of PE. Continue Eliquis.     Spastic hemiplegia affecting left nondominant side [facility]) Appreciate neurology input.  Baclofen has been discontinued and will be listed as an allergy Recommends PT referral or topicals if symptoms recur       Physical deconditioning Appreciate PT input.  Pt is max of 2+ with bed mobility. Lives alone with her spouse/caregiver who is burned out and at risk for injuries  PT recommends SNF on discharge [facility] consult for SNF placement Patient's husband has decided to take her home with home health                 Consultants: Neurology, psychiatry Procedures performed: EEG Disposition: Home health Diet recommendation:  Cardiac and Carb modified diet DISCHARGE MEDICATION: Allergies as of 10/21/2023       Reactions   Baclofen Other (See Comments)   confusion, AMS   Latex Hives   Prednisone Nausea And Vomiting   Pt states causes N/V even when taken with food.        Medication List     STOP taking these medications    baclofen 10 MG tablet Commonly known as:  LIORESAL   clotrimazole 1 % cream Commonly known as: LOTRIMIN       TAKE these medications    acetaminophen 325 MG tablet Commonly known as: TYLENOL Take 325-650 mg by mouth every 6 (six) hours as needed for mild pain or moderate pain.   albuterol 108 (90 Base) MCG/ACT inhaler Commonly known as: VENTOLIN HFA Inhale 2 puffs into the lungs every 6 (six) hours as needed for wheezing or shortness of breath.   apixaban 5 MG Tabs tablet Commonly known as: ELIQUIS Take 1 tablet (5 mg total) by mouth 2 (two) times daily. Hold Eliquis till 12/16/22 and if no further bleeding, resume on 12/17/22   aspirin EC 81 MG tablet Take 1 tablet (81 mg total) by mouth at bedtime. Hold today. Resume on 12/15/22 if no further bleeding   atorvastatin 40 MG tablet Commonly known as: LIPITOR Take 40 mg by mouth every morning.   bisacodyl 5 MG EC tablet Commonly known as: DULCOLAX Take 2 tablets (10 mg total) by mouth at bedtime.   clonazePAM 0.5 MG tablet Commonly known as: KLONOPIN Take 0.5 mg by mouth at bedtime as needed.   desvenlafaxine 50 MG 24 hr tablet Commonly known as: PRISTIQ Take 50 mg by mouth daily.   folic acid 1 MG tablet Commonly known as: [employer]  Take 1 mg by mouth daily.   gabapentin 300 MG capsule Commonly known as: NEURONTIN Take 300 mg by mouth 3 (three) times daily.   hydroxychloroquine 200 MG tablet Commonly known as: PLAQUENIL Take 200 mg by mouth daily.   levothyroxine 100 MCG tablet Commonly known as: SYNTHROID Take 100 mcg by mouth daily before breakfast.   methotrexate 2.5 MG tablet Commonly known as: RHEUMATREX Take 17.5 mg by mouth every Wednesday.   Mounjaro 2.5 MG/0.5ML Pen Generic drug: tirzepatide Inject 2.5 mg into the skin once a week.   nystatin powder Commonly known as: MYCOSTATIN/NYSTOP Apply 1 Application topically 3 (three) times daily.   omeprazole 20 MG capsule Commonly known as: PRILOSEC Take 20 mg by mouth every morning.    ondansetron 4 MG disintegrating tablet Commonly known as: ZOFRAN-ODT Take 4 mg by mouth every 8 (eight) hours as needed.   polyethylene glycol 17 g packet Commonly known as: MIRALAX / GLYCOLAX Take 17 g by mouth 2 (two) times daily.   QUEtiapine 50 MG tablet Commonly known as: SEROQUEL Take 1.5 tablets (75 mg total) by mouth at bedtime. Dose increase        Discharge Exam: Filed Weights   10/16/23 1326 10/16/23 1327  Weight: 118.8 kg 118.8 kg   itals and nursing note reviewed.  Constitutional:      General: Awake, oriented to person and place    Comments:  HENT:     Head: Normocephalic and atraumatic.  Cardiovascular:     Rate and Rhythm: Normal rate and regular rhythm.     Heart sounds: Normal heart sounds.  Pulmonary:     Effort: Pulmonary effort is normal.     Breath sounds: Normal breath sounds.  Abdominal:     Palpations: Abdomen is soft.     Tenderness: There is no abdominal tenderness.  Neurological:     General: Generalized weakness    Oriented to person and place  Condition at discharge: stable  The results of significant diagnostics from this hospitalization (including imaging, microbiology, ancillary and laboratory) are listed below for reference.   Imaging Studies: EEG adult Result Date: 10/17/2023 Charlsie Quest, MD     10/17/2023  8:33 PM Patient Name: QUINN RUPEL MRN: [id number] Epilepsy Attending: Charlsie Quest Referring Physician/Provider: Gordy Councilman, MD Date: 10/17/2023 Duration: 27.02 mins Patient history: 77yo F with ams getting eeg to evaluate for seizur Level of alertness: comatose/ lethargic AEDs during EEG study: None Technical aspects: This EEG study was done with scalp electrodes positioned according to the 10-20 International system of electrode placement. Electrical activity was reviewed with band pass filter of 1-70Hz , sensitivity of 7 uV/mm, display speed of 10mm/sec with a 60Hz  notched filter applied as appropriate. EEG  data were recorded continuously and digitally stored.  Video monitoring was available and reviewed as appropriate. Description: EEG showed continuous generalized rhythmic 3 to 5 Hz theta-delta slowing. Brief 1-3 seconds of generalized eeg attenuation was also noted. Hyperventilation and photic stimulation were not performed.   ABNORMALITY - Continuous slow, generalized IMPRESSION: This study is suggestive of severe diffuse encephalopathy. No seizures or epileptiform discharges were seen throughout the recording. Charlsie Quest   MR BRAIN W WO CONTRAST Result Date: 10/17/2023 CLINICAL DATA:  CNS vasculitis suspected, altered mental status; neck pain and worsening muscle spasms EXAM: MRI HEAD WITHOUT AND WITH CONTRAST MRI CERVICAL SPINE WITHOUT AND WITH CONTRAST TECHNIQUE: Multiplanar, multiecho pulse sequences of the brain and surrounding structures, and cervical spine, to include  the craniocervical junction and cervicothoracic junction, were obtained without and with intravenous contrast. CONTRAST:  10mL GADAVIST GADOBUTROL 1 MMOL/ML IV SOLN COMPARISON:  No prior MRI head or cervical spine available, correlation is made with 10/16/2023 CT head and 10/01/2023 cervical spine FINDINGS: MRI HEAD FINDINGS Brain: No restricted diffusion to suggest acute or subacute infarct. No abnormal parenchymal or meningeal enhancement. No acute hemorrhage, mass, mass effect, or midline shift. No hydrocephalus or extra-axial collection. Partial empty sella. Craniocervical junction within normal limits. Encephalomalacia in the anterior right temporal lobe, right basal ganglia, and right frontal lobe, likely sequela of remote right MCA territory infarct. Hemosiderin deposition in this area likely reflects remote petechial hemorrhage. No superficial siderosis. Advanced cerebral volume loss for age. T2 hyperintense signal in the periventricular white matter, likely the sequela of moderate chronic small vessel ischemic disease.  Wallerian degeneration. Vascular: Normal arterial flow voids. Normal arterial and venous enhancement. Skull and upper cervical spine: Normal marrow signal. Sinuses/Orbits: Mucosal thickening in the ethmoid air cells. No acute finding in the orbits. Other: Trace fluid in left mastoid air cells. MRI CERVICAL SPINE FINDINGS Evaluation is somewhat limited by motion artifact. Alignment: Straightening and reversal of the normal cervical lordosis. Trace anterolisthesis of C3 on C4. Trace retrolisthesis of C6 on C7. Vertebrae: No acute fracture, evidence of discitis, or suspicious osseous lesion. No abnormal enhancement. Cord: Normal signal and morphology.  No abnormal enhancement. Posterior Fossa, vertebral arteries, paraspinal tissues: Negative. Disc levels: C2-C3: No significant disc bulge. Facet arthropathy. No spinal canal stenosis or neural foraminal narrowing. C3-C4: Trace anterolisthesis. Facet and uncovertebral hypertrophy. No spinal canal stenosis. Mild right neural foraminal narrowing. C4-C5: No significant disc bulge. Facet and uncovertebral hypertrophy. No spinal canal stenosis. Mild left neural foraminal narrowing. C5-C6: Disc height loss and disc osteophyte complex with mild disc bulge. Facet and uncovertebral hypertrophy. Mild-to-moderate spinal canal stenosis. Mild bilateral neural foraminal narrowing. C6-C7: Trace retrolisthesis and mild disc bulge. Facet and uncovertebral hypertrophy. Mild-to-moderate spinal canal stenosis. Mild bilateral neural foraminal narrowing. C7-T1: No significant disc bulge. No spinal canal stenosis or neuroforaminal narrowing. IMPRESSION: 1. No acute intracranial process. No evidence of vasculitis. 2. Mild-to-moderate spinal canal stenosis and mild bilateral neural foraminal narrowing at C5-C6 and C6-C7. 3. Mild right neural foraminal narrowing at C3-C4 and mild left neural foraminal narrowing at C4-C5. Electronically Signed   By: Wiliam Ke M.D.   On: 10/17/2023 14:30   MR  CERVICAL SPINE W WO CONTRAST Result Date: 10/17/2023 CLINICAL DATA:  CNS vasculitis suspected, altered mental status; neck pain and worsening muscle spasms EXAM: MRI HEAD WITHOUT AND WITH CONTRAST MRI CERVICAL SPINE WITHOUT AND WITH CONTRAST TECHNIQUE: Multiplanar, multiecho pulse sequences of the brain and surrounding structures, and cervical spine, to include the craniocervical junction and cervicothoracic junction, were obtained without and with intravenous contrast. CONTRAST:  10mL GADAVIST GADOBUTROL 1 MMOL/ML IV SOLN COMPARISON:  No prior MRI head or cervical spine available, correlation is made with 10/16/2023 CT head and 10/01/2023 cervical spine FINDINGS: MRI HEAD FINDINGS Brain: No restricted diffusion to suggest acute or subacute infarct. No abnormal parenchymal or meningeal enhancement. No acute hemorrhage, mass, mass effect, or midline shift. No hydrocephalus or extra-axial collection. Partial empty sella. Craniocervical junction within normal limits. Encephalomalacia in the anterior right temporal lobe, right basal ganglia, and right frontal lobe, likely sequela of remote right MCA territory infarct. Hemosiderin deposition in this area likely reflects remote petechial hemorrhage. No superficial siderosis. Advanced cerebral volume loss for age. T2 hyperintense signal in the  periventricular white matter, likely the sequela of moderate chronic small vessel ischemic disease. Wallerian degeneration. Vascular: Normal arterial flow voids. Normal arterial and venous enhancement. Skull and upper cervical spine: Normal marrow signal. Sinuses/Orbits: Mucosal thickening in the ethmoid air cells. No acute finding in the orbits. Other: Trace fluid in left mastoid air cells. MRI CERVICAL SPINE FINDINGS Evaluation is somewhat limited by motion artifact. Alignment: Straightening and reversal of the normal cervical lordosis. Trace anterolisthesis of C3 on C4. Trace retrolisthesis of C6 on C7. Vertebrae: No acute  fracture, evidence of discitis, or suspicious osseous lesion. No abnormal enhancement. Cord: Normal signal and morphology.  No abnormal enhancement. Posterior Fossa, vertebral arteries, paraspinal tissues: Negative. Disc levels: C2-C3: No significant disc bulge. Facet arthropathy. No spinal canal stenosis or neural foraminal narrowing. C3-C4: Trace anterolisthesis. Facet and uncovertebral hypertrophy. No spinal canal stenosis. Mild right neural foraminal narrowing. C4-C5: No significant disc bulge. Facet and uncovertebral hypertrophy. No spinal canal stenosis. Mild left neural foraminal narrowing. C5-C6: Disc height loss and disc osteophyte complex with mild disc bulge. Facet and uncovertebral hypertrophy. Mild-to-moderate spinal canal stenosis. Mild bilateral neural foraminal narrowing. C6-C7: Trace retrolisthesis and mild disc bulge. Facet and uncovertebral hypertrophy. Mild-to-moderate spinal canal stenosis. Mild bilateral neural foraminal narrowing. C7-T1: No significant disc bulge. No spinal canal stenosis or neuroforaminal narrowing. IMPRESSION: 1. No acute intracranial process. No evidence of vasculitis. 2. Mild-to-moderate spinal canal stenosis and mild bilateral neural foraminal narrowing at C5-C6 and C6-C7. 3. Mild right neural foraminal narrowing at C3-C4 and mild left neural foraminal narrowing at C4-C5. Electronically Signed   By: Wiliam Ke M.D.   On: 10/17/2023 14:30   US Venous Img Upper Left (DVT Study) Result Date: 10/16/2023 CLINICAL DATA:  Left upper extremity pain and edema. Evaluate for DVT. EXAM: LEFT UPPER EXTREMITY VENOUS DOPPLER ULTRASOUND TECHNIQUE: Gray-scale sonography with graded compression, as well as color Doppler and duplex ultrasound were performed to evaluate the upper extremity deep venous system from the level of the subclavian vein and including the jugular, axillary, basilic, radial, ulnar and upper cephalic vein. Spectral Doppler was utilized to evaluate flow at rest  and with distal augmentation maneuvers. COMPARISON:  Left upper extremity venous Doppler ultrasound-09/30/2023 (negative); 12/29/2021 (negative). FINDINGS: Contralateral Subclavian Vein: Respiratory phasicity is normal and symmetric with the symptomatic side. No evidence of thrombus. Normal compressibility. Internal Jugular Vein: No evidence of thrombus. Normal compressibility, respiratory phasicity and response to augmentation. Subclavian Vein: No evidence of thrombus. Normal compressibility, respiratory phasicity and response to augmentation. Axillary Vein: No evidence of thrombus. Normal compressibility, respiratory phasicity and response to augmentation. Cephalic Vein: No evidence of thrombus. Normal compressibility, respiratory phasicity and response to augmentation. Basilic Vein: No evidence of thrombus. Normal compressibility, respiratory phasicity and response to augmentation. Brachial Veins: No evidence of thrombus. Normal compressibility, respiratory phasicity and response to augmentation. Radial Veins: No evidence of thrombus. Normal compressibility, respiratory phasicity and response to augmentation. Ulnar Veins: No evidence of thrombus. Normal compressibility, respiratory phasicity and response to augmentation. Other Findings:  None visualized. IMPRESSION: No evidence of DVT within the left upper extremity, similar to the 09/30/2023 and the 12/29/2021 examinations. Electronically Signed   By: Simonne Come M.D.   On: 10/16/2023 17:09   DG Chest 2 View Result Date: 10/16/2023 CLINICAL DATA:  Altered mental status EXAM: CHEST - 2 VIEW COMPARISON:  CT chest 10/01/2023 and chest radiograph 06/08/2023 FINDINGS: The patient is rotated to the right on today's radiograph, reducing diagnostic sensitivity and specificity. The lungs appear clear.  No blunting of the costophrenic angle. Heart size within normal limits for the AP technique utilized on the frontal radiograph. No significant bony findings. The patient  was presumably unable to raise her arm for the lateral view. IMPRESSION: 1. No acute findings. Electronically Signed   By: Gaylyn Rong M.D.   On: 10/16/2023 15:31   CT HEAD WO CONTRAST ( ) Result Date: 10/16/2023 CLINICAL DATA:  ams EXAM: CT HEAD WITHOUT CONTRAST TECHNIQUE: Contiguous axial images were obtained from the base of the skull through the vertex without intravenous contrast. RADIATION DOSE REDUCTION: This exam was performed according to the departmental dose-optimization program which includes automated exposure control, adjustment of the mA and/or kV according to patient size and/or use of iterative reconstruction technique. COMPARISON:  CT head 10/01/2023. FINDINGS: Motion limited study.  Within this limitation: Brain: Remote right MCA territory infarct. Remote cerebellar infarcts. No evidence of acute large vascular territory infarct, acute hemorrhage, mass lesion, midline shift or hydrocephalus. Cerebral atrophy. Vascular: No hyperdense vessel. Skull: No acute fracture. Sinuses/Orbits: Clear sinuses.  No acute orbital findings. Other: No mastoid effusions. IMPRESSION: Motion limited study without evidence of acute intracranial abnormality. Remote infarcts. Electronically Signed   By: Feliberto Harts M.D.   On: 10/16/2023 14:35   CT CHEST ABDOMEN PELVIS W CONTRAST Result Date: 10/01/2023 CLINICAL DATA:  ground level fall, chest and abdominal pain, evaluating traumatic injuries EXAM: CT CHEST, ABDOMEN, AND PELVIS WITH CONTRAST TECHNIQUE: Multidetector CT imaging of the chest, abdomen and pelvis was performed following the standard protocol during bolus administration of intravenous contrast. RADIATION DOSE REDUCTION: This exam was performed according to the departmental dose-optimization program which includes automated exposure control, adjustment of the mA and/or kV according to patient size and/or use of iterative reconstruction technique. CONTRAST:  OMNIPAQUE IOHEXOL 350  MG/ML SOLN COMPARISON:  CT abdomen pelvis 09/30/2023 FINDINGS: CHEST: Cardiovascular: No aortic injury. The thoracic aorta is normal in caliber. The heart is normal in size. No significant pericardial effusion. Mild atherosclerotic plaque. Mediastinum/Nodes: No pneumomediastinum. No mediastinal hematoma. The esophagus is unremarkable. The thyroid is unremarkable. The central airways are patent. No mediastinal, hilar, or axillary lymphadenopathy. Lungs/Pleura: No focal consolidation. No pulmonary nodule. No pulmonary mass. No pulmonary contusion or laceration. No pneumatocele formation. No pleural effusion. No pneumothorax. No hemothorax. Musculoskeletal/Chest wall: No chest wall mass. No acute rib or sternal fracture. Please see separately dictated CT thoracolumbar spine. ABDOMEN / PELVIS: Hepatobiliary: Not enlarged. No focal lesion. No laceration or subcapsular hematoma. Status post cholecystectomy.  No biliary ductal dilatation. Pancreas: Normal pancreatic contour. No main pancreatic duct dilatation. Spleen: Not enlarged. No focal lesion. No laceration, subcapsular hematoma, or vascular injury. Adrenals/Urinary Tract: No nodularity bilaterally. Bilateral kidneys enhance symmetrically. No hydronephrosis. No contusion, laceration, or subcapsular hematoma. No injury to the vascular structures or collecting systems. No hydroureter. The urinary bladder is unremarkable. Stomach/Bowel: No small or large bowel wall thickening or dilatation. Colonic diverticulosis. Stool throughout the colon. The appendix is not definitely identified with no inflammatory changes in the right lower quadrant to suggest acute appendicitis. Vasculature/Lymphatics: Severe atherosclerotic plaque. Venous collaterals along the lower anterior abdominal wall. No abdominal aorta or iliac aneurysm. No active contrast extravasation or pseudoaneurysm. No abdominal, pelvic, inguinal lymphadenopathy. Reproductive: Status post hysterectomy. Other: No  simple free fluid ascites. No pneumoperitoneum. No hemoperitoneum. No mesenteric hematoma identified. No organized fluid collection. Musculoskeletal: No significant soft tissue hematoma. Diastasis rectus. Tiny of the lung hernia. No acute pelvic fracture. Please see separately dictated CT thoracolumbar spine.  Ports and Devices: None. IMPRESSION: 1. No acute intrathoracic, intra-abdominal, intrapelvic traumatic injury. 2. Please see separately dictated CT thoracolumbar spine 10/01/2023. 3. Other imaging findings of potential clinical significance: Colonic diverticulosis with no acute diverticulitis. Stool throughout the colon-correlate for constipation. Electronically Signed   By: Tish Frederickson M.D.   On: 10/01/2023 23:52   CT T-SPINE NO CHARGE Result Date: 10/01/2023 CLINICAL DATA:  Ground level fall. EXAM: CT THORACIC AND LUMBAR SPINE WITHOUT CONTRAST TECHNIQUE: Multidetector CT imaging of the thoracic and lumbar spine was performed without contrast. Multiplanar CT image reconstructions were also generated. RADIATION DOSE REDUCTION: This exam was performed according to the departmental dose-optimization program which includes automated exposure control, adjustment of the mA and/or kV according to patient size and/or use of iterative reconstruction technique. COMPARISON:  None Available. FINDINGS: CT THORACIC SPINE FINDINGS Alignment: Normal. Vertebrae: Multilevel mild osteophyte formation. No acute fracture or focal pathologic process. Paraspinal and other soft tissues: Negative. Disc levels: Maintained. CT LUMBAR SPINE FINDINGS Segmentation: 5 lumbar type vertebrae. Alignment: Normal. Vertebrae: No acute fracture or focal pathologic process. Paraspinal and other soft tissues: Negative. Disc levels: Maintained peer IMPRESSION: CT THORACIC SPINE IMPRESSION No acute displaced fracture or traumatic listhesis of the thoracic spine. CT LUMBAR SPINE IMPRESSION No acute displaced fracture or traumatic listhesis of  the lumbar spine. Electronically Signed   By: Tish Frederickson M.D.   On: 10/01/2023 23:30   CT L-SPINE NO CHARGE Result Date: 10/01/2023 CLINICAL DATA:  Ground level fall. EXAM: CT THORACIC AND LUMBAR SPINE WITHOUT CONTRAST TECHNIQUE: Multidetector CT imaging of the thoracic and lumbar spine was performed without contrast. Multiplanar CT image reconstructions were also generated. RADIATION DOSE REDUCTION: This exam was performed according to the departmental dose-optimization program which includes automated exposure control, adjustment of the mA and/or kV according to patient size and/or use of iterative reconstruction technique. COMPARISON:  None Available. FINDINGS: CT THORACIC SPINE FINDINGS Alignment: Normal. Vertebrae: Multilevel mild osteophyte formation. No acute fracture or focal pathologic process. Paraspinal and other soft tissues: Negative. Disc levels: Maintained. CT LUMBAR SPINE FINDINGS Segmentation: 5 lumbar type vertebrae. Alignment: Normal. Vertebrae: No acute fracture or focal pathologic process. Paraspinal and other soft tissues: Negative. Disc levels: Maintained peer IMPRESSION: CT THORACIC SPINE IMPRESSION No acute displaced fracture or traumatic listhesis of the thoracic spine. CT LUMBAR SPINE IMPRESSION No acute displaced fracture or traumatic listhesis of the lumbar spine. Electronically Signed   By: Tish Frederickson M.D.   On: 10/01/2023 23:30   CT Head Wo Contrast Result Date: 10/01/2023 CLINICAL DATA:  Ground-level fall, unsure head injury, prior history of CVA, evaluating for ICH; Ground-level fall, tenderness palpation to C-spine EXAM: CT HEAD WITHOUT CONTRAST CT CERVICAL SPINE WITHOUT CONTRAST TECHNIQUE: Multidetector CT imaging of the head and cervical spine was performed following the standard protocol without intravenous contrast. Multiplanar CT image reconstructions of the cervical spine were also generated. RADIATION DOSE REDUCTION: This exam was performed according to the  departmental dose-optimization program which includes automated exposure control, adjustment of the mA and/or kV according to patient size and/or use of iterative reconstruction technique. COMPARISON:  CT head 03/10/2023 FINDINGS: CT HEAD FINDINGS Brain: Cerebral ventricle sizes are concordant with the degree of cerebral volume loss. Patchy and confluent areas of decreased attenuation are noted throughout the deep and periventricular white matter of the cerebral hemispheres bilaterally, compatible with chronic microvascular ischemic disease. Similar-appearing ring right MCA territory, bilateral cerebellar, left basal ganglia encephalomalacia. No evidence of large-territorial acute infarction. No parenchymal hemorrhage. No  mass lesion. No extra-axial collection. No mass effect or midline shift. No hydrocephalus. Basilar cisterns are patent. Vascular: No hyperdense vessel. Skull: No acute fracture or focal lesion. Sinuses/Orbits: Paranasal sinuses and mastoid air cells are clear. The orbits are unremarkable. Other: None. CT CERVICAL SPINE FINDINGS Alignment: Normal. Skull base and vertebrae: Multilevel degenerative changes of the spine most prominent at the C5-C7 levels. No associated severe osseous neural foraminal or central canal stenosis. No acute fracture. No aggressive appearing focal osseous lesion or focal pathologic process. Soft tissues and spinal canal: No prevertebral fluid or swelling. No visible canal hematoma. Upper chest: Unremarkable. Other: Retropharyngeal course of the carotid arteries within the neck. IMPRESSION: 1. No acute intracranial abnormality. 2. No acute displaced fracture or traumatic listhesis of the cervical spine. Electronically Signed   By: Tish Frederickson M.D.   On: 10/01/2023 23:27   CT Cervical Spine Wo Contrast Result Date: 10/01/2023 CLINICAL DATA:  Ground-level fall, unsure head injury, prior history of CVA, evaluating for ICH; Ground-level fall, tenderness palpation to  C-spine EXAM: CT HEAD WITHOUT CONTRAST CT CERVICAL SPINE WITHOUT CONTRAST TECHNIQUE: Multidetector CT imaging of the head and cervical spine was performed following the standard protocol without intravenous contrast. Multiplanar CT image reconstructions of the cervical spine were also generated. RADIATION DOSE REDUCTION: This exam was performed according to the departmental dose-optimization program which includes automated exposure control, adjustment of the mA and/or kV according to patient size and/or use of iterative reconstruction technique. COMPARISON:  CT head 03/10/2023 FINDINGS: CT HEAD FINDINGS Brain: Cerebral ventricle sizes are concordant with the degree of cerebral volume loss. Patchy and confluent areas of decreased attenuation are noted throughout the deep and periventricular white matter of the cerebral hemispheres bilaterally, compatible with chronic microvascular ischemic disease. Similar-appearing ring right MCA territory, bilateral cerebellar, left basal ganglia encephalomalacia. No evidence of large-territorial acute infarction. No parenchymal hemorrhage. No mass lesion. No extra-axial collection. No mass effect or midline shift. No hydrocephalus. Basilar cisterns are patent. Vascular: No hyperdense vessel. Skull: No acute fracture or focal lesion. Sinuses/Orbits: Paranasal sinuses and mastoid air cells are clear. The orbits are unremarkable. Other: None. CT CERVICAL SPINE FINDINGS Alignment: Normal. Skull base and vertebrae: Multilevel degenerative changes of the spine most prominent at the C5-C7 levels. No associated severe osseous neural foraminal or central canal stenosis. No acute fracture. No aggressive appearing focal osseous lesion or focal pathologic process. Soft tissues and spinal canal: No prevertebral fluid or swelling. No visible canal hematoma. Upper chest: Unremarkable. Other: Retropharyngeal course of the carotid arteries within the neck. IMPRESSION: 1. No acute intracranial  abnormality. 2. No acute displaced fracture or traumatic listhesis of the cervical spine. Electronically Signed   By: Tish Frederickson M.D.   On: 10/01/2023 23:27   CT ABDOMEN PELVIS W CONTRAST Result Date: 09/30/2023 CLINICAL DATA:  Abdominal pain.  Currently under treatment for UTI. EXAM: CT ABDOMEN AND PELVIS WITH CONTRAST TECHNIQUE: Multidetector CT imaging of the abdomen and pelvis was performed using the standard protocol following bolus administration of intravenous contrast. RADIATION DOSE REDUCTION: This exam was performed according to the departmental dose-optimization program which includes automated exposure control, adjustment of the mA and/or kV according to patient size and/or use of iterative reconstruction technique. CONTRAST:  OMNIPAQUE IOHEXOL 350 MG/ML SOLN COMPARISON:  CT pelvis without contrast 07/11/2021, CT abdomen pelvis with IV contrast 02/16/2017. FINDINGS: Lower chest: Lung bases are clear with mild chronic elevation of the right hemidiaphragm. The cardiac size is normal. There are single-vessel calcifications  in the LAD coronary artery. Hepatobiliary: No focal liver abnormality is seen. Status post cholecystectomy. No biliary dilatation. The liver is mildly steatotic. Pancreas: There is fatty infiltration but no focal abnormality. Spleen: No abnormality. Adrenals/Urinary Tract: Adrenal glands are unremarkable. Kidneys are normal, without renal calculi, focal lesion, or hydronephrosis. Bladder is unremarkable for the degree of distention. Stomach/Bowel: No dilatation or wall thickening. An appendix is not seen in this patient. There is mild-to-moderate fecal stasis. Colonic diverticulosis without evidence of diverticulitis. Vascular/Lymphatic: There is patchy aortoiliac atherosclerosis without aneurysm. Circumaortic left renal vein. There are abdominal wall varices emptying into the common femoral veins. No lymphadenopathy is seen. Reproductive: Status post hysterectomy. No  adnexal masses. Other: Small umbilical fat hernia. No incarcerated hernia. No free air, hemorrhage or fluid. Musculoskeletal: Osteopenia with mild degenerative changes of the spine. No new osseous abnormality. IMPRESSION: 1. No acute CT findings in the abdomen or pelvis. 2. Constipation and diverticulosis. 3. Aortic and coronary artery atherosclerosis. 4. 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 This was seen previously. 5. Umbilical fat hernia. 6. Osteopenia and degenerative change. Aortic Atherosclerosis (ICD10-I70.0). Electronically Signed   By: Almira Bar M.D.   On: 09/30/2023 05:11   US Venous Img Upper Uni Left Result Date: 09/30/2023 CLINICAL DATA:  Left upper extremity swelling. EXAM: LEFT UPPER EXTREMITY VENOUS DOPPLER ULTRASOUND TECHNIQUE: Gray-scale sonography with graded compression, as well as color Doppler and duplex ultrasound were performed to evaluate the upper extremity deep venous system from the level of the subclavian vein and including the jugular, axillary, basilic, radial, ulnar and upper cephalic vein. Spectral Doppler was utilized to evaluate flow at rest and with distal augmentation maneuvers. COMPARISON:  December 29, 2021 FINDINGS: Contralateral Subclavian Vein: Respiratory phasicity is normal and symmetric with the symptomatic side. No evidence of thrombus. Normal compressibility. Internal Jugular Vein: No evidence of thrombus. Normal compressibility, respiratory phasicity and response to augmentation. Subclavian Vein: No evidence of thrombus. Normal compressibility, respiratory phasicity and response to augmentation. Axillary Vein: No evidence of thrombus. Normal compressibility, respiratory phasicity and response to augmentation. Cephalic Vein: No evidence of thrombus. Normal compressibility, respiratory phasicity and response to augmentation. Basilic Vein: No evidence of thrombus. Normal compressibility, respiratory phasicity and response to augmentation.  Brachial Veins: No evidence of thrombus. Normal compressibility, respiratory phasicity and response to augmentation. Radial Veins: No evidence of thrombus. Normal compressibility, respiratory phasicity and response to augmentation. Ulnar Veins: No evidence of thrombus. Normal compressibility, respiratory phasicity and response to augmentation. Venous Reflux:  None visualized. Other Findings: Limited study secondary to left arm rigidity due to a prior stroke. IMPRESSION: No evidence of DVT within the LEFT upper extremity. Electronically Signed   By: Aram Candela M.D.   On: 09/30/2023 01:46    Microbiology: Results for orders placed or performed during the hospital encounter of 10/16/23  Urine Culture     Status: Abnormal   Collection Time: 10/16/23  9:38 PM   Specimen: Urine, Clean Catch  Result Value Ref Range Status   Specimen Description   Final    URINE, CLEAN CATCH Performed at [facility], [address], [place], [place] [address]    Special Requests   Final    NONE Performed at [facility], [address] [place], [place] [address]    Culture >=100,000 COLONIES/mL STAPHYLOCOCCUS HAEMOLYTICUS (A)  Final   Report Status 10/19/2023 FINAL  Final   Organism ID, Bacteria STAPHYLOCOCCUS HAEMOLYTICUS (A)  Final      Susceptibility  Staphylococcus haemolyticus - MIC*    CIPROFLOXACIN >=8 RESISTANT Resistant     GENTAMICIN >=16 RESISTANT Resistant     NITROFURANTOIN <=16 SENSITIVE Sensitive     OXACILLIN >=4 RESISTANT Resistant     TETRACYCLINE 2 SENSITIVE Sensitive     VANCOMYCIN 1 SENSITIVE Sensitive     TRIMETH/SULFA 160 RESISTANT Resistant     RIFAMPIN >=32 RESISTANT Resistant     Inducible Clindamycin NEGATIVE Sensitive     * >=100,000 COLONIES/mL STAPHYLOCOCCUS HAEMOLYTICUS  Resp panel by RT-PCR (RSV, Flu A&B, Covid) Anterior Nasal Swab     Status: None   Collection Time: 10/17/23 12:11 AM   Specimen: Anterior Nasal Swab  Result Value Ref Range  Status   SARS Coronavirus 2 by RT PCR NEGATIVE NEGATIVE Final    Comment: (NOTE) SARS-CoV-2 target nucleic acids are NOT DETECTED.  The SARS-CoV-2 RNA is generally detectable in upper respiratory specimens during the acute phase of infection. The lowest concentration of SARS-CoV-2 viral copies this assay can detect is 138 copies/mL. A negative result does not preclude SARS-Cov-2 infection and should not be used as the sole basis for treatment or other patient management decisions. A negative result may occur with  improper specimen collection/handling, submission of specimen other than nasopharyngeal swab, presence of viral mutation(s) within the areas targeted by this assay, and inadequate number of viral copies(<138 copies/mL). A negative result must be combined with clinical observations, patient history, and epidemiological information. The expected result is Negative.  Fact Sheet for Patients:  BloggerCourse.com  Fact Sheet for Healthcare Providers:  SeriousBroker.it  This test is no t yet approved or cleared by the [place] FDA and  has been authorized for detection and/or diagnosis of SARS-CoV-2 by FDA under an Emergency Use Authorization (EUA). This EUA will remain  in effect (meaning this test can be used) for the duration of the COVID-19 declaration under Section 564(b)(1) of the Act, 21 U.S.C.section 360bbb-3(b)(1), unless the authorization is terminated  or revoked sooner.       Influenza A by PCR NEGATIVE NEGATIVE Final   Influenza B by PCR NEGATIVE NEGATIVE Final    Comment: (NOTE) The Xpert Xpress SARS-CoV-2/FLU/RSV plus assay is intended as an aid in the diagnosis of influenza from Nasopharyngeal swab specimens and should not be used as a sole basis for treatment. Nasal washings and aspirates are unacceptable for Xpert Xpress SARS-CoV-2/FLU/RSV testing.  Fact Sheet for  Patients: BloggerCourse.com  Fact Sheet for Healthcare Providers: SeriousBroker.it  This test is not yet approved or cleared by the [place] FDA and has been authorized for detection and/or diagnosis of SARS-CoV-2 by FDA under an Emergency Use Authorization (EUA). This EUA will remain in effect (meaning this test can be used) for the duration of the COVID-19 declaration under Section 564(b)(1) of the Act, 21 U.S.C. section 360bbb-3(b)(1), unless the authorization is terminated or revoked.     Resp Syncytial Virus by PCR NEGATIVE NEGATIVE Final    Comment: (NOTE) Fact Sheet for Patients: BloggerCourse.com  Fact Sheet for Healthcare Providers: SeriousBroker.it  This test is not yet approved or cleared by the [place] FDA and has been authorized for detection and/or diagnosis of SARS-CoV-2 by FDA under an Emergency Use Authorization (EUA). This EUA will remain in effect (meaning this test can be used) for the duration of the COVID-19 declaration under Section 564(b)(1) of the Act, 21 U.S.C. section 360bbb-3(b)(1), unless the authorization is terminated or revoked.  Performed at [facility], [address] [place],  [place] [address]     Labs: CBC: Recent Labs  Lab 10/15/23 0549 10/16/23 1342 10/19/23 0423  WBC 6.2 9.1 7.7  NEUTROABS 3.7  --   --   HGB 11.8* 13.0 12.8  HCT 38.3 40.6 39.4  MCV 100.3* 95.1 94.7  PLT 190 225 210   Basic Metabolic Panel: Recent Labs  Lab 10/15/23 0549 10/16/23 1342 10/19/23 0423  NA 140 141 139  K 3.6 4.2 3.7  CL 107 105 101  CO2 26 30 28   GLUCOSE 237* 114* 113*  BUN 7* 10 9  CREATININE 0.51 0.58 0.53  CALCIUM 8.0* 8.8* 8.5*   Liver Function Tests: Recent Labs  Lab 10/16/23 1342  AST 28  ALT 23  ALKPHOS 78  BILITOT 1.2*  PROT 6.0*  ALBUMIN 3.3*   CBG: Recent Labs  Lab 10/20/23 1632 10/20/23 1956  10/21/23 0756 10/21/23 1146 10/21/23 1542  GLUCAP 105* 113* 84 101* 83    Discharge time spent: greater than 30 minutes.  Signed: Lucile Shutters, MD [facility] 10/21/2023

## 2023-10-21 NOTE — Progress Notes (Signed)
Physical Therapy Treatment Patient Details Name: Ariel Alvarez MRN: [id number] DOB: 01-18-1946 Today's Date: 10/21/2023   History of Present Illness Ariel Alvarez is a 77 y.o. female with medical history significant for anxiety, morbid obesity, collagen vascular disease, vasculitis, depression, GERD, Hx DVT on Eliquis, hypothyroidism and CVA with spastic left-sided hemiplegia, recently hospitalized from 12/3 to 10/05/23 with acute metabolic encephalopathy that was attributed to polypharmacy, ruled out for acute infection, who presents to the ED for altered mental status, agitation and muscle spasms.  She is unable to contribute to the history.  Patient was seen in the ED the day prior with muscle spasms and was prescribed baclofen and discharge.  Since arrival in the ED she has been very agitated and hitting out, requiring Haldol for sedation.    PT Comments  Pt is making gradual progress towards goals. Very cooperative and calm throughout session. Able to participate and eager to perform OOB mobility. Alert and oriented, however limited short term memory. Heavy +2 assist for bed mobility with Max assist for standing at EOB, very little effort by patient. Flaccid L UE and trace movement of L LE. Will be unable to utilize RW in room, consider alternative AD for future sessions. At this time, recommend [facility] lift for OOB mobility with progress towards SPT ideally to R side using AD. Will continue to progress as able.    If plan is discharge home, recommend the following: Two people to help with walking and/or transfers;Two people to help with bathing/dressing/bathroom;Assistance with cooking/housework;Assistance with feeding;Direct supervision/assist for medications management;Assist for transportation;Help with stairs or ramp for entrance;Supervision due to cognitive status   Can travel by private vehicle     No  Equipment Recommendations  None recommended by PT    Recommendations for Other  Services       Precautions / Restrictions Precautions Precautions: Fall Restrictions Weight Bearing Restrictions Per Provider Order: No     Mobility  Bed Mobility Overal bed mobility: Needs Assistance Bed Mobility: Supine to Sit, Sit to Supine     Supine to sit: Max assist, +2 for physical assistance, HOB elevated Sit to supine: Max assist, +2 for physical assistance, HOB elevated   General bed mobility comments: needs cues for sequencing. Heavy +2 assist for rolling and trunkal elevation to sit at EOB. Decreased ROM noted in L LE and consistent CGA to maintain balance    Transfers Overall transfer level: Needs assistance Equipment used: 2 person hand held assist Transfers: Sit to/from Stand, Bed to chair/wheelchair/BSC Sit to Stand: Max assist, +2 physical assistance           General transfer comment: Able to stand with L knee blocking. Once standing, minimal buttock clearance noted and poor endurance with quick fatigue. Heavy max A + 2 for performance. May improve with use of HW due to L UE contracture    Ambulation/Gait               General Gait Details: not safe   Stairs             Wheelchair Mobility     Tilt Bed    Modified Rankin (Stroke Patients Only)       Balance Overall balance assessment: Needs assistance Sitting-balance support: Single extremity supported Sitting balance-Leahy Scale: Fair     Standing balance support: Bilateral upper extremity supported Standing balance-Leahy Scale: Poor  Cognition Arousal: Alert Behavior During Therapy: WFL for tasks assessed/performed Overall Cognitive Status: Impaired/Different from baseline                                 General Comments: oriented x 3, however demonstrates poor short term memory asking several times for therapist to get her standing despite already attempting.        Exercises Other Exercises Other  Exercises: educated in positioning in bed secondary to L hemiparetic side. Wascloth rolled up and placed in palm and L UE elevated in addition to B heels. Encouraged pt to continue strengthening R LE to decrease risk of deconditioning while admitted.    General Comments        Pertinent Vitals/Pain Pain Assessment Pain Assessment: No/denies pain    Home Living                          Prior Function            PT Goals (current goals can now be found in the care plan section) Acute Rehab PT Goals Patient Stated Goal: to get up to the chair PT Goal Formulation: With patient Time For Goal Achievement: 11/02/23 Potential to Achieve Goals: Fair Progress towards PT goals: Progressing toward goals    Frequency    Min 1X/week      PT Plan      Co-evaluation              AM-PAC PT "6 Clicks" Mobility   Outcome Measure  Help needed turning from your back to your side while in a flat bed without using bedrails?: Total Help needed moving from lying on your back to sitting on the side of a flat bed without using bedrails?: Total Help needed moving to and from a bed to a chair (including a wheelchair)?: Total Help needed standing up from a chair using your arms (e.g., wheelchair or bedside chair)?: Total Help needed to walk in hospital room?: Total Help needed climbing 3-5 steps with a railing? : Total 6 Click Score: 6    End of Session Equipment Utilized During Treatment: Oxygen Activity Tolerance: Patient limited by fatigue Patient left: in bed;with bed alarm set Nurse Communication: Mobility status PT Visit Diagnosis: Muscle weakness (generalized) (M62.81);History of falling (Z91.81)     Time: 0865-7846 PT Time Calculation (min) (ACUTE ONLY): 12 min  Charges:    $Therapeutic Activity: 8-22 mins PT General Charges $$ ACUTE PT VISIT: 1 Visit                     Ariel Alvarez, PT, DPT, GCS [phone]    Ariel Alvarez 10/21/2023, 2:45  PM

## 2023-10-21 NOTE — Plan of Care (Signed)

## 2023-10-22 LAB — VITAMIN B1: Vitamin B1 (Thiamine): 130.3 nmol/L (ref 66.5–200.0)

## 2023-11-05 ENCOUNTER — Ambulatory Visit: Payer: Medicaid Other | Admitting: Urology

## 2023-11-05 ENCOUNTER — Encounter: Payer: Self-pay | Admitting: Urology

## 2023-12-04 ENCOUNTER — Ambulatory Visit: Payer: Self-pay | Admitting: Urology

## 2024-01-21 ENCOUNTER — Ambulatory Visit: Payer: Medicare PPO | Admitting: Urology

## 2024-01-21 DIAGNOSIS — N390 Urinary tract infection, site not specified: Secondary | ICD-10-CM

## 2024-02-12 ENCOUNTER — Ambulatory Visit: Admitting: Urology

## 2024-02-12 DIAGNOSIS — N390 Urinary tract infection, site not specified: Secondary | ICD-10-CM

## 2024-02-12 DIAGNOSIS — R3911 Hesitancy of micturition: Secondary | ICD-10-CM

## 2024-05-27 ENCOUNTER — Emergency Department

## 2024-05-27 ENCOUNTER — Encounter: Payer: Self-pay | Admitting: Emergency Medicine

## 2024-05-27 ENCOUNTER — Emergency Department
Admission: EM | Admit: 2024-05-27 | Discharge: 2024-05-27 | Disposition: A | Discharge status: Home / Self Care | Attending: Emergency Medicine | Admitting: Emergency Medicine

## 2024-05-27 ENCOUNTER — Other Ambulatory Visit: Payer: Self-pay

## 2024-05-27 DIAGNOSIS — T792XXA Traumatic secondary and recurrent hemorrhage and seroma, initial encounter: Secondary | ICD-10-CM

## 2024-05-27 DIAGNOSIS — S8011XA Contusion of right lower leg, initial encounter: Secondary | ICD-10-CM | POA: Insufficient documentation

## 2024-05-27 DIAGNOSIS — X58XXXA Exposure to other specified factors, initial encounter: Secondary | ICD-10-CM | POA: Insufficient documentation

## 2024-05-27 DIAGNOSIS — S8991XA Unspecified injury of right lower leg, initial encounter: Secondary | ICD-10-CM | POA: Diagnosis present

## 2024-05-27 DIAGNOSIS — Z7901 Long term (current) use of anticoagulants: Secondary | ICD-10-CM | POA: Insufficient documentation

## 2024-05-27 NOTE — Discharge Instructions (Addendum)
 Return to the ER for fevers, worsening pain or any other concerns  No definite evidence of deep venous thrombosis seen in right lower extremity. Fluid collection described above anterior to the knee which may represent effusion or seroma.

## 2024-05-27 NOTE — ED Provider Notes (Addendum)
 [facility] Provider Note    Event Date/Time   First MD Initiated Contact with Patient 05/27/24 1255     (approximate)   History   Leg Pain   HPI  Ariel Alvarez is a 78 y.o. female with history of stroke with left-sided deficits, prior DVTs on Eliquis  who comes in with concerns for right leg swelling.  Patient reports having a small fluctuant mass in her mid lower leg that is now traveled to the right side of her knee.  She denies any fevers, redness.  Denies any known falls.  She reports that she has a history of blood clots so she was worried that it was a blood clot that was traveling.  Physical Exam   Triage Vital Signs: ED Triage Vitals  Encounter Vitals Group     BP 05/27/24 1246 130/63     Girls Systolic BP Percentile --      Girls Diastolic BP Percentile --      Boys Systolic BP Percentile --      Boys Diastolic BP Percentile --      Pulse Rate 05/27/24 1246 95     Resp 05/27/24 1246 18     Temp 05/27/24 1246 98.7 F (37.1 C)     Temp Source 05/27/24 1246 Oral     SpO2 05/27/24 1246 92 %     Weight 05/27/24 1244 252 lb (114.3 kg)     Height 05/27/24 1244 5' 3 (1.6 m)     Head Circumference --      Peak Flow --      Pain Score 05/27/24 1244 10     Pain Loc --      Pain Education --      Exclude from Growth Chart --     Most recent vital signs: Vitals:   05/27/24 1246  BP: 130/63  Pulse: 95  Resp: 18  Temp: 98.7 F (37.1 C)  SpO2: 92%     General: Awake, no distress.  CV:  Good peripheral perfusion.  Resp:  Normal effort.  Abd:  No distention.  Other:  Patient is able to range the knee.  There was a little bit of fluctuation noted on the lateral aspect that looks like a fluid pocket.  No redness no warmth noted good distal pulse.  Able to lift the leg up. No pain at the hip.  Able to straight leg raise.   ED Results / Procedures / Treatments   Labs (all labs ordered are listed, but only abnormal results are  displayed) Labs Reviewed - No data to display     RADIOLOGY I have reviewed the xray personally and interpreted no fracture   PROCEDURES:  Critical Care performed: No  Procedures   MEDICATIONS ORDERED IN ED: Medications - No data to display   IMPRESSION / MDM / ASSESSMENT AND PLAN / ED COURSE  I reviewed the triage vital signs and the nursing notes.   Patient's presentation is most consistent with acute, uncomplicated illness.   Differential includes seroma, effusion, DVT, mass.  Good distal pulse unlikely arterial issue.  X-ray just shows some degenerative changes.  Ultrasound negative for DVT possible effusion or seroma  Reevaluated patient with the elevation of her leg the fluid pocket is not even there anymore.  I suspect that this is some type of effusion or seroma does not appear to be infected in nature I do not feel like there be any benefit to attempting tap of her  knee as this does not appear to be a septic joint.  However I did discuss with her return precautions in regards to worsening swelling, pain or any other concerns but at this time the swelling is actually come down with elevation.  I suspect patient did have some type of trauma to the leg which caused a mild seroma but with elevation the fluid collection has come down.  Patient feels comfortable with discharge home and will follow-up with PCP as needed.  I did note her oxygen level was 92% but she reports that this is her baseline.  She denies any shortness of breath and states that she typically runs like this.  FINAL CLINICAL IMPRESSION(S) / ED DIAGNOSES   Final diagnoses:  Seroma due to trauma [facility])     Rx / DC Orders   ED Discharge Orders     None        Note:  This document was prepared using Dragon voice recognition software and may include unintentional dictation errors.   Ernest Ronal BRAVO, MD 05/27/24 1450    Ernest Ronal BRAVO, MD 05/27/24 [phone]

## 2024-05-27 NOTE — ED Triage Notes (Signed)
 Patient to ED via [facility] from home for a possible blood clot. PT has knot on right shin that has moved up her thigh. Hx of blood clot, stroke- left side deficits. Swelling and burning to leg. On blood thinners. VS WNL

## 2024-05-27 NOTE — ED Notes (Signed)
 U/s at bedside

## 2024-06-17 ENCOUNTER — Other Ambulatory Visit: Payer: Self-pay | Admitting: Podiatry

## 2024-06-26 ENCOUNTER — Encounter: Payer: Self-pay | Admitting: Urgent Care

## 2024-06-26 ENCOUNTER — Other Ambulatory Visit: Payer: Self-pay

## 2024-06-26 ENCOUNTER — Encounter
Admission: RE | Admit: 2024-06-26 | Discharge: 2024-06-26 | Disposition: A | Discharge status: Home / Self Care | Source: Ambulatory Visit | Attending: Podiatry | Admitting: Podiatry

## 2024-06-26 VITALS — Ht 63.0 in | Wt 250.0 lb

## 2024-06-26 DIAGNOSIS — E1142 Type 2 diabetes mellitus with diabetic polyneuropathy: Secondary | ICD-10-CM

## 2024-06-26 DIAGNOSIS — Z01818 Encounter for other preprocedural examination: Secondary | ICD-10-CM

## 2024-06-26 HISTORY — DX: Restless legs syndrome: G25.81

## 2024-06-26 HISTORY — DX: Personal history of other venous thrombosis and embolism: Z86.718

## 2024-06-26 HISTORY — DX: Insomnia, unspecified: G47.00

## 2024-06-26 HISTORY — DX: Type 2 diabetes mellitus with diabetic polyneuropathy: E11.42

## 2024-06-26 HISTORY — DX: Anemia, unspecified: D64.9

## 2024-06-26 NOTE — Patient Instructions (Addendum)
 Your procedure is scheduled on: FRIDAY 07/03/24 Report to the Registration Desk on the 1st floor of the [facility]. To find out your arrival time, please call [phone] between 1PM - 3PM on: THURSDAY 07/02/24 If your arrival time is 6:00 am, do not arrive before that time as the [facility] entrance doors do not open until 6:00 am.  REMEMBER: Instructions that are not followed completely may result in serious medical risk, up to and including death; or upon the discretion of your surgeon and anesthesiologist your surgery may need to be rescheduled.  Do not eat food after midnight the night before surgery.  No gum chewing or hard candies.  You may however, drink WATER up to 2 hours before you are scheduled to arrive for your surgery. Do not drink anything within 2 hours of your scheduled arrival time.   In addition, your doctor has ordered for you to drink the provided:  Gatorade G2 Drinking this carbohydrate drink up to two hours before surgery helps to reduce insulin  resistance and improve patient outcomes. Please complete drinking 2 hours before scheduled arrival time.  One week prior to surgery: Stop Anti-inflammatories (NSAIDS) such as Advil , Aleve, Ibuprofen , Motrin , Naproxen, Naprosyn and Aspirin  based products such as Excedrin, Goody's Powder, BC Powder. Stop ANY OVER THE COUNTER supplements until after surgery.  Stop apixaban  (ELIQUIS ) 3 days before surgery. Last dose 06/29/24  Stop aspirin  3 days before surgery. Last dose 06/29/24  You may however, continue to take Tylenol  if needed for pain up until the day of surgery.  Continue taking all of your other prescription medications up until the day of surgery.  ON THE DAY OF SURGERY ONLY TAKE THESE MEDICATIONS WITH SIPS OF WATER:  atorvastatin  (LIPITOR)  acetaminophen  (TYLENOL )  albuterol  (VENTOLIN  HFA)  gabapentin  (NEURONTIN )  levothyroxine  (SYNTHROID )  omeprazole (PRILOSEC)   Use inhalers on the day of surgery and bring  to the hospital.  No Alcohol for 24 hours before or after surgery.  No Smoking including e-cigarettes for 24 hours before surgery.  No chewable tobacco products for at least 6 hours before surgery.  No nicotine patches on the day of surgery.  Do not use any recreational drugs for at least a week (preferably 2 weeks) before your surgery.  Please be advised that the combination of cocaine and anesthesia may have negative outcomes, up to and including death. If you test positive for cocaine, your surgery will be cancelled.  On the morning of surgery brush your teeth with toothpaste and water, you may rinse your mouth with mouthwash if you wish. Do not swallow any toothpaste or mouthwash.   Use CHG Soap or wipes as directed on instruction sheet.  Do not wear jewelry, make-up, hairpins, clips or nail polish.  For welded (permanent) jewelry: bracelets, anklets, waist bands, etc.  Please have this removed prior to surgery.  If it is not removed, there is a chance that hospital personnel will need to cut it off on the day of surgery.  Do not wear lotions, powders, or perfumes.   Do not shave body hair from the neck down 48 hours before surgery.  Contact lenses, hearing aids and dentures may not be worn into surgery.  Do not bring valuables to the hospital. [facility] is not responsible for any missing/lost belongings or valuables.   Bring your C-PAP to the hospital in case you may have to spend the night.   Notify your doctor if there is any change in your medical  condition (cold, fever, infection).  Wear comfortable clothing (specific to your surgery type) to the hospital.  After surgery, you can help prevent lung complications by doing breathing exercises.  Take deep breaths and cough every 1-2 hours. Your doctor may order a device called an Incentive Spirometer to help you take deep breaths. When coughing or sneezing, hold a pillow firmly against your incision with both hands. This  is called "splinting." Doing this helps protect your incision. It also decreases belly discomfort.  If you are being discharged the day of surgery, you will not be allowed to drive home. You will need a responsible individual to drive you home and stay with you for 24 hours after surgery.   If you are taking public transportation, you will need to have a responsible individual with you.  Please call the Pre-admissions Testing Dept. at [phone] if you have any questions about these instructions.  Surgery Visitation Policy:  Patients having surgery or a procedure may have two visitors.  Children under the age of 1 must have an adult with them who is not the patient.  Merchandiser, retail to address health-related social needs:  [email or web address]                                                                                                             Preparing for Surgery with CHLORHEXIDINE  GLUCONATE (CHG) Soap  Chlorhexidine  Gluconate (CHG) Soap  o An antiseptic cleaner that kills germs and bonds with the skin to continue killing germs even after washing  o Used for showering the night before surgery and morning of surgery  Before surgery, you can play an important role by reducing the number of germs on your skin.  CHG (Chlorhexidine  gluconate) soap is an antiseptic cleanser which kills germs and bonds with the skin to continue killing germs even after washing.  Please do not use if you have an allergy to CHG or antibacterial soaps. If your skin becomes reddened/irritated stop using the CHG.  1. Shower the NIGHT BEFORE SURGERY and the MORNING OF SURGERY with CHG soap.  2. If you choose to wash your hair, wash your hair first as usual with your normal shampoo.  3. After shampooing, rinse your hair and body thoroughly to remove the shampoo.  4. Use CHG as you would any other liquid soap. You can apply CHG directly to the skin and wash gently with a  scrungie or a clean washcloth.  5. Apply the CHG soap to your body only from the neck down. Do not use on open wounds or open sores. Avoid contact with your eyes, ears, mouth, and genitals (private parts). Wash face and genitals (private parts) with your normal soap.  6. Wash thoroughly, paying special attention to the area where your surgery will be performed.  7. Thoroughly rinse your body with warm water.  8. Do not shower/wash with your normal soap after using and rinsing off the CHG soap.  9. Pat yourself dry with a clean towel.  10. Wear clean pajamas to  bed the night before surgery.  12. Place clean sheets on your bed the night of your first shower and do not sleep with pets.  13. Shower again with the CHG soap on the day of surgery prior to arriving at the hospital.  14. Do not apply any deodorants/lotions/powders.  15. Please wear clean clothes to the hospital.

## 2024-06-26 NOTE — Pre-Procedure Instructions (Signed)
 Spoke to patient's husband who manages her care. He states he typically signs for her related to her stroke history, left sided weakness and cognitive issues.  He stated she had previous issues waking up from anesthesia, and was told that she required much less anesthesia than normal.  Husband also expressed concerns with ambulating patient after surgery related to her left sided weakness.

## 2024-06-30 ENCOUNTER — Encounter
Admission: RE | Admit: 2024-06-30 | Discharge: 2024-06-30 | Disposition: A | Discharge status: Home / Self Care | Source: Ambulatory Visit | Attending: Podiatry | Admitting: Podiatry

## 2024-06-30 DIAGNOSIS — Z01810 Encounter for preprocedural cardiovascular examination: Secondary | ICD-10-CM | POA: Insufficient documentation

## 2024-06-30 DIAGNOSIS — Z01818 Encounter for other preprocedural examination: Secondary | ICD-10-CM

## 2024-06-30 DIAGNOSIS — E1142 Type 2 diabetes mellitus with diabetic polyneuropathy: Secondary | ICD-10-CM | POA: Diagnosis not present

## 2024-07-03 ENCOUNTER — Ambulatory Visit: Admitting: Certified Registered"

## 2024-07-03 ENCOUNTER — Other Ambulatory Visit: Payer: Self-pay

## 2024-07-03 ENCOUNTER — Ambulatory Visit
Admission: RE | Admit: 2024-07-03 | Discharge: 2024-07-03 | Disposition: A | Discharge status: Home / Self Care | Attending: Podiatry | Admitting: Podiatry

## 2024-07-03 ENCOUNTER — Encounter: Payer: Self-pay | Admitting: Podiatry

## 2024-07-03 ENCOUNTER — Encounter
Admission: RE | Disposition: A | Discharge status: Home / Self Care | Payer: Self-pay | Source: Home / Self Care | Attending: Podiatry

## 2024-07-03 ENCOUNTER — Ambulatory Visit: Payer: Self-pay | Admitting: Urgent Care

## 2024-07-03 DIAGNOSIS — Z86718 Personal history of other venous thrombosis and embolism: Secondary | ICD-10-CM | POA: Diagnosis not present

## 2024-07-03 DIAGNOSIS — Z7901 Long term (current) use of anticoagulants: Secondary | ICD-10-CM | POA: Diagnosis not present

## 2024-07-03 DIAGNOSIS — Z01818 Encounter for other preprocedural examination: Secondary | ICD-10-CM

## 2024-07-03 DIAGNOSIS — L859 Epidermal thickening, unspecified: Secondary | ICD-10-CM | POA: Diagnosis not present

## 2024-07-03 DIAGNOSIS — E1142 Type 2 diabetes mellitus with diabetic polyneuropathy: Secondary | ICD-10-CM

## 2024-07-03 DIAGNOSIS — E119 Type 2 diabetes mellitus without complications: Secondary | ICD-10-CM | POA: Diagnosis not present

## 2024-07-03 DIAGNOSIS — Z79899 Other long term (current) drug therapy: Secondary | ICD-10-CM | POA: Diagnosis not present

## 2024-07-03 DIAGNOSIS — I96 Gangrene, not elsewhere classified: Secondary | ICD-10-CM | POA: Insufficient documentation

## 2024-07-03 DIAGNOSIS — K219 Gastro-esophageal reflux disease without esophagitis: Secondary | ICD-10-CM | POA: Insufficient documentation

## 2024-07-03 DIAGNOSIS — F419 Anxiety disorder, unspecified: Secondary | ICD-10-CM | POA: Diagnosis not present

## 2024-07-03 DIAGNOSIS — E039 Hypothyroidism, unspecified: Secondary | ICD-10-CM | POA: Insufficient documentation

## 2024-07-03 DIAGNOSIS — J449 Chronic obstructive pulmonary disease, unspecified: Secondary | ICD-10-CM | POA: Insufficient documentation

## 2024-07-03 DIAGNOSIS — M2041 Other hammer toe(s) (acquired), right foot: Secondary | ICD-10-CM | POA: Diagnosis present

## 2024-07-03 DIAGNOSIS — Z87891 Personal history of nicotine dependence: Secondary | ICD-10-CM | POA: Insufficient documentation

## 2024-07-03 DIAGNOSIS — Z8673 Personal history of transient ischemic attack (TIA), and cerebral infarction without residual deficits: Secondary | ICD-10-CM | POA: Diagnosis not present

## 2024-07-03 DIAGNOSIS — L97411 Non-pressure chronic ulcer of right heel and midfoot limited to breakdown of skin: Secondary | ICD-10-CM | POA: Diagnosis present

## 2024-07-03 DIAGNOSIS — F32A Depression, unspecified: Secondary | ICD-10-CM | POA: Insufficient documentation

## 2024-07-03 DIAGNOSIS — Z7982 Long term (current) use of aspirin: Secondary | ICD-10-CM | POA: Insufficient documentation

## 2024-07-03 DIAGNOSIS — G4730 Sleep apnea, unspecified: Secondary | ICD-10-CM | POA: Diagnosis not present

## 2024-07-03 DIAGNOSIS — Z7984 Long term (current) use of oral hypoglycemic drugs: Secondary | ICD-10-CM | POA: Insufficient documentation

## 2024-07-03 HISTORY — PX: AMPUTATION TOE: SHX6595

## 2024-07-03 LAB — GLUCOSE, CAPILLARY
Glucose-Capillary: 84 mg/dL (ref 70–99)
Glucose-Capillary: 106 mg/dL — ABNORMAL HIGH (ref 70–99)

## 2024-07-03 SURGERY — AMPUTATION, TOE
Anesthesia: General | Site: Second Toe | Laterality: Right

## 2024-07-03 MED ORDER — CEFAZOLIN SODIUM-DEXTROSE 2-4 GM/100ML-% IV SOLN
2.0000 g | INTRAVENOUS | Status: AC
  Administered 2024-07-03: 2 g via INTRAVENOUS

## 2024-07-03 MED ORDER — MIDAZOLAM HCL 2 MG/2ML IJ SOLN
INTRAMUSCULAR | Status: DC | PRN
  Administered 2024-07-03 (×2): .5 mg via INTRAVENOUS

## 2024-07-03 MED ORDER — MIDAZOLAM HCL 2 MG/2ML IJ SOLN
INTRAMUSCULAR | Status: AC
  Filled 2024-07-03: qty 2

## 2024-07-03 MED ORDER — CEFAZOLIN SODIUM-DEXTROSE 2-4 GM/100ML-% IV SOLN
INTRAVENOUS | Status: AC
  Filled 2024-07-03: qty 100

## 2024-07-03 MED ORDER — CHLORHEXIDINE GLUCONATE 0.12 % MT SOLN
15.0000 mL | Freq: Once | OROMUCOSAL | Status: AC
  Administered 2024-07-03: 15 mL via OROMUCOSAL

## 2024-07-03 MED ORDER — FENTANYL CITRATE (PF) 100 MCG/2ML IJ SOLN
INTRAMUSCULAR | Status: AC
Start: 2024-07-03 — End: 2024-07-03
  Filled 2024-07-03: qty 2

## 2024-07-03 MED ORDER — 0.9 % SODIUM CHLORIDE (POUR BTL) OPTIME
TOPICAL | Status: DC | PRN
  Administered 2024-07-03: 500 mL

## 2024-07-03 MED ORDER — ORAL CARE MOUTH RINSE: 15.0000 mL | Freq: Once | OROMUCOSAL | Status: AC

## 2024-07-03 MED ORDER — HYDROCODONE-ACETAMINOPHEN 5-325 MG PO TABS: 1.0000 | ORAL_TABLET | Freq: Four times a day (QID) | ORAL | 0 refills | Status: AC | PRN

## 2024-07-03 MED ORDER — LIDOCAINE HCL (PF) 2 % IJ SOLN
INTRAMUSCULAR | Status: DC | PRN
  Administered 2024-07-03: 60 mg via INTRADERMAL

## 2024-07-03 MED ORDER — BUPIVACAINE HCL (PF) 0.5 % IJ SOLN
INTRAMUSCULAR | Status: AC
  Filled 2024-07-03: qty 30

## 2024-07-03 MED ORDER — FENTANYL CITRATE (PF) 100 MCG/2ML IJ SOLN
INTRAMUSCULAR | Status: DC | PRN
  Administered 2024-07-03: 25 ug via INTRAVENOUS

## 2024-07-03 MED ORDER — OXYCODONE HCL 5 MG PO TABS
5.0000 mg | ORAL_TABLET | Freq: Once | ORAL | Status: AC | PRN
  Administered 2024-07-03: 5 mg via ORAL

## 2024-07-03 MED ORDER — PROPOFOL 10 MG/ML IV BOLUS
INTRAVENOUS | Status: DC | PRN
  Administered 2024-07-03: 10 mg via INTRAVENOUS

## 2024-07-03 MED ORDER — VANCOMYCIN HCL 1000 MG IV SOLR
INTRAVENOUS | Status: AC
  Filled 2024-07-03: qty 20

## 2024-07-03 MED ORDER — CHLORHEXIDINE GLUCONATE 0.12 % MT SOLN
OROMUCOSAL | Status: AC
  Filled 2024-07-03: qty 15

## 2024-07-03 MED ORDER — OXYCODONE HCL 5 MG PO TABS
ORAL_TABLET | ORAL | Status: AC
  Filled 2024-07-03: qty 1

## 2024-07-03 MED ORDER — BUPIVACAINE HCL 0.5 % IJ SOLN
INTRAMUSCULAR | Status: DC | PRN
  Administered 2024-07-03 (×2): 10 mL

## 2024-07-03 MED ORDER — PROPOFOL 500 MG/50ML IV EMUL
INTRAVENOUS | Status: DC | PRN
  Administered 2024-07-03: 75 ug/kg/min via INTRAVENOUS

## 2024-07-03 MED ORDER — SODIUM CHLORIDE 0.9 % IV SOLN: INTRAVENOUS | Status: DC

## 2024-07-03 MED ORDER — FENTANYL CITRATE (PF) 100 MCG/2ML IJ SOLN: 25.0000 ug | INTRAMUSCULAR | Status: DC | PRN

## 2024-07-03 MED ORDER — PROPOFOL 1000 MG/100ML IV EMUL
INTRAVENOUS | Status: AC
  Filled 2024-07-03: qty 100

## 2024-07-03 MED ORDER — OXYCODONE HCL 5 MG/5ML PO SOLN: 5.0000 mg | Freq: Once | ORAL | Status: AC | PRN

## 2024-07-03 SURGICAL SUPPLY — 40 items
BLADE MED AGGRESSIVE (BLADE) ×1 IMPLANT
BLADE OSC/SAGITTAL MD 5.5X18 (BLADE) IMPLANT
BLADE SURG 15 STRL LF DISP TIS (BLADE) IMPLANT
BLADE SURG MINI STRL (BLADE) IMPLANT
BNDG ELASTIC 4INX 5YD STR LF (GAUZE/BANDAGES/DRESSINGS) ×1 IMPLANT
BNDG ESMARCH 4X12 STRL LF (GAUZE/BANDAGES/DRESSINGS) ×1 IMPLANT
BNDG GAUZE DERMACEA FLUFF 4 (GAUZE/BANDAGES/DRESSINGS) ×1 IMPLANT
CNTNR URN SCR LID CUP LEK RST (MISCELLANEOUS) ×1 IMPLANT
CUFF TOURN SGL QUICK 12 (TOURNIQUET CUFF) IMPLANT
CUFF TOURN SGL QUICK 18X4 (TOURNIQUET CUFF) IMPLANT
DRAPE FLUOR MINI C-ARM 54X84 (DRAPES) IMPLANT
DURAPREP 26ML APPLICATOR (WOUND CARE) ×1 IMPLANT
ELECTRODE REM PT RTRN 9FT ADLT (ELECTROSURGICAL) ×1 IMPLANT
GAUZE SPONGE 4X4 12PLY STRL (GAUZE/BANDAGES/DRESSINGS) ×2 IMPLANT
GAUZE STRETCH 2X75IN STRL (MISCELLANEOUS) IMPLANT
GAUZE XEROFORM 1X8 LF (GAUZE/BANDAGES/DRESSINGS) ×1 IMPLANT
GLOVE BIO SURGEON STRL SZ7 (GLOVE) ×1 IMPLANT
GLOVE INDICATOR 7.0 STRL GRN (GLOVE) ×1 IMPLANT
GOWN STRL REUS W/ TWL LRG LVL3 (GOWN DISPOSABLE) ×2 IMPLANT
HANDPIECE VERSAJET DEBRIDEMENT (MISCELLANEOUS) IMPLANT
KIT TURNOVER KIT A (KITS) ×1 IMPLANT
LABEL OR SOLS (LABEL) ×1 IMPLANT
MANIFOLD NEPTUNE II (INSTRUMENTS) ×1 IMPLANT
NDL FILTER BLUNT 18X1 1/2 (NEEDLE) ×1 IMPLANT
NDL HYPO 25X1 1.5 SAFETY (NEEDLE) ×2 IMPLANT
NEEDLE FILTER BLUNT 18X1 1/2 (NEEDLE) ×1 IMPLANT
NEEDLE HYPO 25X1 1.5 SAFETY (NEEDLE) ×2 IMPLANT
NS IRRIG 500ML POUR BTL (IV SOLUTION) ×1 IMPLANT
PACK EXTREMITY ARMC (MISCELLANEOUS) ×1 IMPLANT
PAD ABD DERMACEA PRESS 5X9 (GAUZE/BANDAGES/DRESSINGS) IMPLANT
PENCIL SMOKE EVACUATOR (MISCELLANEOUS) ×1 IMPLANT
SOL .9 NS 3000ML IRR UROMATIC (IV SOLUTION) IMPLANT
SOLUTION PREP PVP 2OZ (MISCELLANEOUS) ×1 IMPLANT
STOCKINETTE STRL 6IN 960660 (GAUZE/BANDAGES/DRESSINGS) ×1 IMPLANT
SUT VIC AB 3-0 SH 27X BRD (SUTURE) ×1 IMPLANT
SUTURE EHLN 3-0 FS-10 30 BLK (SUTURE) ×2 IMPLANT
SWAB CULTURE AMIES ANAERIB BLU (MISCELLANEOUS) IMPLANT
SYR 10ML LL (SYRINGE) ×1 IMPLANT
TRAP FLUID SMOKE EVACUATOR (MISCELLANEOUS) ×1 IMPLANT
WATER STERILE IRR 500ML POUR (IV SOLUTION) ×1 IMPLANT

## 2024-07-03 NOTE — Discharge Instructions (Signed)
 [facility]  POST OPERATIVE INSTRUCTIONS FOR DR. ASHLEY AND DR. Dooms Antolin [facility] PODIATRY DEPARTMENT   Take your medication as prescribed.  Pain medication should be taken only as needed.  Keep the dressing clean, dry and intact.  May be weightbearing as tolerated in postop shoe but try to walk with most pressure on the heel.  Keep your foot elevated above the heart level for the first 48 hours.  Continue elevation thereafter to improve swelling.  Walking to the bathroom and brief periods of walking are acceptable, unless we have instructed you to be non-weight bearing.  Always wear your post-op shoe when walking.  Always use your crutches if you are to be non-weight bearing.  Do not take a shower. Baths are permissible as long as the foot is kept out of the water.   Every hour you are awake:  Bend your knee 15 times. Massage calf 15 times  Call [facility] [phone]) if any of the following problems occur: You develop a temperature or fever. The bandage becomes saturated with blood. Medication does not stop your pain. Injury of the foot occurs. Any symptoms of infection including redness, odor, or red streaks running from wound.

## 2024-07-03 NOTE — Anesthesia Postprocedure Evaluation (Signed)
 Anesthesia Post Note  Patient: CALAIS SVEHLA  Procedure(s) Performed: AMPUTATION, TOE (Right: Second Toe)  Patient location during evaluation: PACU Anesthesia Type: General Level of consciousness: awake and alert Pain management: pain level controlled Vital Signs Assessment: post-procedure vital signs reviewed and stable Respiratory status: spontaneous breathing, nonlabored ventilation, respiratory function stable and patient connected to nasal cannula oxygen Cardiovascular status: blood pressure returned to baseline and stable Postop Assessment: no apparent nausea or vomiting Anesthetic complications: no   No notable events documented.   Last Vitals:  Vitals:   07/03/24 1700 07/03/24 1719  BP: (!) 160/80 (!) 152/62  Pulse: 75 79  Resp: (!) 25 18  Temp: 37 C 36.4 C  SpO2: 97% 96%    Last Pain:  Vitals:   07/03/24 1719  TempSrc:   PainSc: 0-No pain                 Debby Mines

## 2024-07-03 NOTE — Op Note (Signed)
 PODIATRY / FOOT AND ANKLE SURGERY OPERATIVE REPORT    SURGEON: Prentice Lee, DPM  PRE-OPERATIVE DIAGNOSIS:  Right second hammertoe  POST-OPERATIVE DIAGNOSIS: Same  PROCEDURE(S): Right second toe amputation  HEMOSTASIS: Right ankle tourniquet  ANESTHESIA: MAC  ESTIMATED BLOOD LOSS: 10 cc  FINDING(S): 1.  Contracted right second toe  PATHOLOGY/SPECIMEN(S): Second toe  INDICATIONS:   Ariel Alvarez is a 78 y.o. female who presents with chronic pain related to right second hammertoe and PIPJ callus/ulcer present.  All treatment options were discussed with the patient both conservative and surgical attempts at correction include potential risks and complications at this time patient is elected for procedure consisting of right second toe amputation.  No guarantees given.  Consent obtained prior to procedure..  DESCRIPTION: After obtaining full informed written consent, the patient was brought back to the operating room and placed supine upon the operating table.  The patient received IV antibiotics prior to induction.  After obtaining adequate anesthesia, the patient was prepped and draped in the standard fashion.  20 cc of half percent Marcaine  plain was injected about the right second ray and second toe area.  An Esmarch bandage was used to exsanguinate the right lower extremity and the pneumatic ankle tourniquet was inflated.  Attention was directed to the right second toe where a racquet type of incision was made around the base of the second digit with arms extending proximally and plantarly over the second metatarsal phalangeal joint area.  The incision was made straight to bone.  An extensor tenotomy followed by capsulotomy was performed followed by release of the collateral and suspensory ligaments as well as any connection to the flexor tendon and plantar plate.  The second toe was disarticulated and passed off the operative site and sent off to pathology.  The surgical site was  flushed with copious amounts normal sterile saline.  Any bleeding vessels were cauterized as necessary.  The deep structures as well as subcutaneous tissues reapproximated well coapted with 3-0 Vicryl and the skin was then reapproximated well coapted with 3-0 nylon.  The ankle tourniquet was released and a prompt hyperemic response was noted to all digits the right foot, hemostasis appeared to be well achieved.  A postoperative dressing was applied consisting of Xeroform followed by 4 x 4 gauze, gauze roll, Ace wrap.  The patient tolerated the procedure and anesthesia well and was transferred to the recovery room with vital signs stable and vascular status intact to all toes of the right foot.  Following a period of postoperative monitoring the patient be discharged home with the appropriate orders, instructions, and medications.  COMPLICATIONS: None  CONDITION: Good, stable  Prentice Lee, DPM

## 2024-07-03 NOTE — Anesthesia Procedure Notes (Signed)
 Procedure Name: MAC Date/Time: 07/03/2024 3:40 PM  Performed by: Lacretia Camelia NOVAK, CRNAPre-anesthesia Checklist: Patient identified, Emergency Drugs available, Suction available and Patient being monitored Patient Re-evaluated:Patient Re-evaluated prior to induction Oxygen Delivery Method: Simple face mask

## 2024-07-03 NOTE — H&P (Signed)
 HISTORY AND PHYSICAL INTERVAL NOTE:  07/03/2024  3:18 PM  Ariel Alvarez  has presented today for surgery, with the diagnosis of right 2nd hammer toe.  The various methods of treatment have been discussed with the patient.  No guarantees were given.  After consideration of risks, benefits and other options for treatment, the patient has consented to surgery.  I have reviewed the patients' chart and labs.    PROCEDURE: RIGHT 2ND TOE AMPUTATION  A history and physical examination was performed in my office.  The patient was reexamined.  There have been no changes to this history and physical examination.  Prentice Lee, DPM

## 2024-07-03 NOTE — Transfer of Care (Signed)
 Immediate Anesthesia Transfer of Care Note  Patient: Ariel Alvarez  Procedure(s) Performed: AMPUTATION, TOE (Right: Second Toe)  Patient Location: PACU  Anesthesia Type:General  Level of Consciousness: awake, alert , and oriented  Airway & Oxygen Therapy: Patient Spontanous Breathing  Post-op Assessment: Report given to RN and Post -op Vital signs reviewed and stable  Post vital signs: Reviewed and stable  Last Vitals:  Vitals Value Taken Time  BP 141/75 (95)   Temp    Pulse 95 07/03/24 16:17  Resp 17 07/03/24 16:17  SpO2 88 % 07/03/24 16:17  Vitals shown include unfiled device data.  Last Pain:  Vitals:   07/03/24 1408  TempSrc: Temporal  PainSc: 5       Patients Stated Pain Goal: 0 (07/03/24 1408)  Complications: No notable events documented.

## 2024-07-03 NOTE — Anesthesia Preprocedure Evaluation (Signed)
 Anesthesia Evaluation  Patient identified by MRN, date of birth, ID band Patient awake    Reviewed: Allergy & Precautions, NPO status , Patient's Chart, lab work & pertinent test results  History of Anesthesia Complications (+) PROLONGED EMERGENCE and history of anesthetic complications  Airway Mallampati: III  TM Distance: >3 FB Neck ROM: full    Dental  (+) Partial Upper, Chipped, Poor Dentition   Pulmonary neg pulmonary ROS, sleep apnea (not using CPAP) , COPD,  COPD inhaler, Not current smoker, former smoker   Pulmonary exam normal        Cardiovascular (-) hypertension(-) Past MI and (-) CHF negative cardio ROS Normal cardiovascular exam(-) dysrhythmias (-) Valvular Problems/Murmurs     Neuro/Psych neg Seizures  Anxiety Depression    CVA (L sided weakness, speech difficulties), Residual Symptoms negative neurological ROS  negative psych ROS   GI/Hepatic negative GI ROS, Neg liver ROS,GERD  Medicated and Controlled,,  Endo/Other  negative endocrine ROSdiabetesHypothyroidism    Renal/GU negative Renal ROS  negative genitourinary   Musculoskeletal   Abdominal   Peds  Hematology negative hematology ROS (+)   Anesthesia Other Findings Past Medical History: No date: Anemia No date: Anxiety No date: Arthritis     Comment:  RIGHT HAND No date: Collagen vascular disease (HCC) No date: Complication of anesthesia     Comment:  HARD TO WAKE UP AFTER  ONE SURGERY-PT STAETS SHE WAS               GIVEN TOO MUCH ANESTHESIA No date: Depression No date: Dyspnea     Comment:  VERY RARE WITH EXERTION No date: GERD (gastroesophageal reflux disease) No date: History of kidney stones     Comment:  H/O No date: History of venous thromboembolism No date: Hypothyroidism No date: Insomnia No date: Restless leg syndrome 2005,2015: Stroke (HCC)     Comment:  X2-LEFT HAD PARALYZED  No date: Type 2 diabetes mellitus with diabetic  polyneuropathy,  without long-term current use of insulin  (HCC)  Past Surgical History: No date: CHOLECYSTECTOMY No date: KIDNEY STONE SURGERY 04/15/2020: METATARSAL HEAD EXCISION; Bilateral     Comment:  Procedure: METATARSAL HEAD PARTIAL EXCISION - BILATERAL;              Surgeon: Neill Boas, DPM;  Location: [facility] ORS;  Service:              Podiatry;  Laterality: Bilateral;     Reproductive/Obstetrics negative OB ROS                              Anesthesia Physical Anesthesia Plan  ASA: 3  Anesthesia Plan: General   Post-op Pain Management:    Induction: Intravenous  PONV Risk Score and Plan: 3 and Ondansetron  and Dexamethasone  Airway Management Planned: Natural Airway and Nasal Cannula  Additional Equipment:   Intra-op Plan:   Post-operative Plan:   Informed Consent: I have reviewed the patients History and Physical, chart, labs and discussed the procedure including the risks, benefits and alternatives for the proposed anesthesia with the patient or authorized representative who has indicated his/her understanding and acceptance.     Dental Advisory Given  Plan Discussed with: Anesthesiologist, CRNA and Surgeon  Anesthesia Plan Comments: (Patient consented for risks of anesthesia including but not limited to:  - adverse reactions to medications - risk of airway placement if required - damage to eyes, teeth, lips or other oral mucosa - nerve  damage due to positioning  - sore throat or hoarseness - Damage to heart, brain, nerves, lungs, other parts of body or loss of life  Patient voiced understanding and assent.)        Anesthesia Quick Evaluation

## 2024-07-04 ENCOUNTER — Encounter: Payer: Self-pay | Admitting: Podiatry

## 2024-07-07 LAB — SURGICAL PATHOLOGY

## 2024-09-10 ENCOUNTER — Ambulatory Visit (INDEPENDENT_AMBULATORY_CARE_PROVIDER_SITE_OTHER)

## 2024-09-10 ENCOUNTER — Ambulatory Visit
Admission: EM | Admit: 2024-09-10 | Discharge: 2024-09-10 | Disposition: A | Discharge status: Home / Self Care | Attending: Physician Assistant | Admitting: Physician Assistant

## 2024-09-10 ENCOUNTER — Ambulatory Visit: Payer: Self-pay | Admitting: Physician Assistant

## 2024-09-10 DIAGNOSIS — R051 Acute cough: Secondary | ICD-10-CM | POA: Diagnosis not present

## 2024-09-10 DIAGNOSIS — R5383 Other fatigue: Secondary | ICD-10-CM

## 2024-09-10 DIAGNOSIS — J441 Chronic obstructive pulmonary disease with (acute) exacerbation: Secondary | ICD-10-CM

## 2024-09-10 LAB — POC COVID19/FLU A&B COMBO
Covid Antigen, POC: NEGATIVE
Influenza A Antigen, POC: NEGATIVE
Influenza B Antigen, POC: NEGATIVE

## 2024-09-10 MED ORDER — IPRATROPIUM-ALBUTEROL 0.5-2.5 (3) MG/3ML IN SOLN
3.0000 mL | Freq: Once | RESPIRATORY_TRACT | Status: AC
  Administered 2024-09-10: 3 mL via RESPIRATORY_TRACT

## 2024-09-10 MED ORDER — DOXYCYCLINE HYCLATE 100 MG PO CAPS: 100.0000 mg | ORAL_CAPSULE | Freq: Two times a day (BID) | ORAL | 0 refills | Status: AC

## 2024-09-10 MED ORDER — IPRATROPIUM-ALBUTEROL 0.5-2.5 (3) MG/3ML IN SOLN: 3.0000 mL | Freq: Four times a day (QID) | RESPIRATORY_TRACT | 0 refills | Status: AC | PRN

## 2024-09-10 MED ORDER — PROMETHAZINE-DM 6.25-15 MG/5ML PO SYRP: 5.0000 mL | ORAL_SOLUTION | Freq: Four times a day (QID) | ORAL | 0 refills | Status: AC | PRN

## 2024-09-10 NOTE — ED Provider Notes (Signed)
 [facility]    CSN: [id number] Arrival date & time: 09/10/24  0847      History   Chief Complaint Chief Complaint  Patient presents with   Cough    HPI Ariel Alvarez is a 78 y.o. female with history of COPD, rheumatoid arthritis, collagen vascular disease, type 2 diabetes, GERD, hypothyroidism, obesity, previous PE and stroke, and cognitive disorder.   Patient presents today for 3-day history of cough, congestion, chills, and shortness of breath. Patient says everything has gone to her chest. No fevers.Tried albuterol  inhaler yesterday without relief. Has been using cough drops too.  Her husband was recently ill with similar symptoms.  He states he is feeling better now.    Patient denies being aware of any history of COPD but it is clearly documented in chart and shown on the CT scan from last year.  She does not use any routine inhalers.  States baseline oxygen is about 91%.  HPI  Past Medical History:  Diagnosis Date   Anemia    Anxiety    Arthritis    RIGHT HAND   Collagen vascular disease    Complication of anesthesia    HARD TO WAKE UP AFTER  ONE SURGERY-PT STAETS SHE WAS GIVEN TOO MUCH ANESTHESIA   Depression    Dyspnea    VERY RARE WITH EXERTION   GERD (gastroesophageal reflux disease)    History of kidney stones    H/O   History of venous thromboembolism    Hypothyroidism    Insomnia    Restless leg syndrome    Stroke (HCC) [id number]   X2-LEFT HAD PARALYZED    Type 2 diabetes mellitus with diabetic polyneuropathy, without long-term current use of insulin  [facility])     Patient Active Problem List   Diagnosis Date Noted   Delirium, drug-induced 10/19/2023   Acute encephalopathy 10/16/2023   Generalized weakness 10/02/2023   Acute lower UTI 10/02/2023   GERD without esophagitis 10/02/2023   History of vasculitis 10/02/2023   Anxiety and depression 10/02/2023   Recurrent UTI 09/10/2023   Dysuria 09/10/2023   Sepsis due to pneumonia (HCC)  06/09/2023   Depression 06/09/2023   Type 2 diabetes mellitus with peripheral neuropathy (HCC) 06/09/2023   History of DVT (deep vein thrombosis) 06/09/2023   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) 06/09/2023   Acute hypoxemic respiratory failure due to COVID-19 (HCC) 06/08/2023   Hypoxia 12/14/2022   COPD with acute bronchitis (HCC) 12/14/2022   History of pulmonary embolism 12/14/2022   Chronic anticoagulation 12/14/2022   Bleeding gums 12/14/2022   Rheumatoid arthritis (HCC) 12/06/2022   CAP (community acquired pneumonia) 01/31/2022   Diabetes (HCC) 01/31/2022   Acute respiratory failure with hypoxia (HCC) 01/31/2022   Weakness of both lower extremities 01/31/2022   Lactic acidosis 01/31/2022   Moderate episode of recurrent major depressive disorder (HCC) 10/03/2021   Cognitive disorder 10/03/2021   Episodic mood disorder 10/03/2021   Psychosis (HCC) 10/03/2021   Gait disturbance 12/19/2020   Rash 07/25/2020   Increased frequency of urination 07/25/2020   Closed nondisplaced fracture of proximal phalanx of left little finger with routine healing 04/07/2020   Hyperlipidemia 12/08/2019   Stroke (HCC) 12/08/2019   Ulcerated, foot, unspecified laterality, limited to breakdown of skin (HCC) 12/08/2019   Other constipation 09/28/2019   Dysphagia 09/28/2019   Pelvic pain in female 05/18/2019   Age-related osteoporosis without current pathological fracture 08/08/2018   Urinary hesitancy 10/04/2017   Routine health maintenance 08/26/2017  Candidiasis 08/11/2017   Obesity, Class III, BMI 40-49.9 (morbid obesity) (HCC) 07/30/2017   Collagen vascular disease 05/21/2017   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) 05/21/2017   Chronic pain 05/21/2017   Cerebrovascular 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) 05/06/2017   Preoperative clearance 05/06/2017   DVT (deep venous thrombosis) (HCC) 03/24/2017   Contracture of joint of left hand  02/17/2017   Other pulmonary embolism without acute cor pulmonale (HCC) 02/06/2017   Right pontine stroke (HCC) 02/03/2017   Vasculitis 01/29/2017   Abnormal ANCA test 12/30/2016   Right carpal tunnel syndrome 04/06/2016   GERD (gastroesophageal reflux disease) 12/31/2014   Dyslipidemia 04/06/2014   Calculus of kidney and ureter 05/06/2012   Primary insomnia 07/06/2011   Hypothyroidism 07/06/2011   Depressive disorder 07/06/2011   Spastic hemiplegia affecting nondominant side (HCC) 03/27/2011    Past Surgical History:  Procedure Laterality Date   AMPUTATION TOE Right 07/03/2024   Procedure: AMPUTATION, TOE;  Surgeon: Lennie Barter, DPM;  Location: [facility] ORS;  Service: Orthopedics/Podiatry;  Laterality: Right;   CHOLECYSTECTOMY     KIDNEY STONE SURGERY     METATARSAL HEAD EXCISION Bilateral 04/15/2020   Procedure: METATARSAL HEAD PARTIAL EXCISION - BILATERAL;  Surgeon: Neill Boas, DPM;  Location: [facility] ORS;  Service: Podiatry;  Laterality: Bilateral;    OB History   No obstetric history on file.      Home Medications    Prior to Admission medications   Medication Sig Start Date End Date Taking? Authorizing Provider  doxycycline  (VIBRAMYCIN ) 100 MG capsule Take 1 capsule (100 mg total) by mouth 2 (two) times daily for 7 days. 09/10/24 09/17/24 Yes Arvis Jolan NOVAK, PA-C  ipratropium-albuterol  (DUONEB) 0.5-2.5 (3) MG/3ML SOLN Take 3 mLs by nebulization every 6 (six) hours as needed. 09/10/24  Yes Arvis Jolan NOVAK, PA-C  promethazine-dextromethorphan (PROMETHAZINE-DM) 6.25-15 MG/5ML syrup Take 5 mLs by mouth 4 (four) times daily as needed. 09/10/24  Yes Arvis Jolan B, PA-C  acetaminophen  (TYLENOL ) 325 MG tablet Take 325-650 mg by mouth every 6 (six) hours as needed for mild pain or moderate pain.    [provider]  albuterol  (VENTOLIN  HFA) 108 (90 Base) MCG/ACT inhaler Inhale 2 puffs into the lungs every 6 (six) hours as needed for wheezing or shortness of breath.     [provider]  apixaban  (ELIQUIS ) 5 MG TABS tablet Take 1 tablet (5 mg total) by mouth 2 (two) times daily. Hold Eliquis  till 12/16/22 and if no further bleeding, resume on 12/17/22 12/17/22 10/02/23  Maree Hue, MD  aspirin  EC 81 MG tablet Take 1 tablet (81 mg total) by mouth at bedtime. Hold today. Resume on 12/15/22 if no further bleeding 12/15/22   Maree Hue, MD  atorvastatin  (LIPITOR) 40 MG tablet Take 40 mg by mouth every morning.     [provider]  clobetasol cream (TEMOVATE) 0.05 % Apply 1 Application topically 2 (two) times daily.    [provider]  clonazePAM  (KLONOPIN ) 0.5 MG tablet Take 0.5 mg by mouth at bedtime as needed.    [provider]  desvenlafaxine (PRISTIQ) 50 MG 24 hr tablet Take 50 mg by mouth daily. 06/15/21   [provider]  folic acid  (FOLVITE ) 1 MG tablet Take 1 mg by mouth daily.    [provider]  gabapentin  (NEURONTIN ) 300 MG capsule Take 600 mg by mouth 2 (two) times daily.    [provider]  hydroxychloroquine  (PLAQUENIL ) 200 MG tablet  Take 200 mg by mouth daily.    [provider]  levothyroxine  (SYNTHROID ) 100 MCG tablet Take 100 mcg by mouth daily before breakfast. 08/05/20   [provider]  methotrexate  (RHEUMATREX) 2.5 MG tablet Take 17.5 mg by mouth every Wednesday.    [provider]  nystatin  (MYCOSTATIN /NYSTOP ) powder Apply 1 Application topically 3 (three) times daily.    [provider]  omeprazole (PRILOSEC) 20 MG capsule Take 20 mg by mouth every morning.     [provider]  ondansetron  (ZOFRAN -ODT) 4 MG disintegrating tablet Take 4 mg by mouth every 8 (eight) hours as needed.    [provider]  QUEtiapine  (SEROQUEL ) 50 MG tablet Take 1.5 tablets (75 mg total) by mouth at bedtime. Dose increase 10/03/21   Eappen, Saramma, MD  Semaglutide ,0.25 or 0.5MG /DOS, (OZEMPIC , 0.25 OR 0.5 MG/DOSE,) 2 MG/1.5ML SOPN Inject 2.5 mg into the skin  once a week.    [provider]  eszopiclone (LUNESTA) 2 MG TABS tablet Take 2 mg by mouth at bedtime as needed for sleep. Take immediately before bedtime Patient not taking: Reported on 04/14/2020  09/08/20  [provider]  phentermine 30 MG capsule Take 30 mg by mouth every morning.  09/08/20  [provider]  warfarin (COUMADIN) 2.5 MG tablet Take 2.5 mg by mouth. Patient not taking: Reported on 04/14/2020  09/08/20  [provider]    Family History Family History  Problem Relation Age of Onset   Alcohol abuse Father    Depression Father    Suicidality Father    Stroke Paternal Grandmother    Diabetes Son    Cancer Paternal Aunt     Social History Social History   Tobacco Use   Smoking status: Former    Current packs/day: 0.00    Average packs/day: 1 pack/day for 20.0 years (20.0 ttl pk-yrs)    Types: Cigarettes    Start date: 10/29/1954    Quit date: 10/29/1974    Years since quitting: 49.9   Smokeless tobacco: Never  Vaping Use   Vaping status: Never Used  Substance Use Topics   Alcohol use: Not Currently    Comment: WINE OCC   Drug use: No     Allergies   Baclofen , Latex, and Prednisone    Review of Systems Review of Systems  Constitutional:  Positive for chills and fatigue. Negative for diaphoresis and fever.  HENT:  Positive for congestion and rhinorrhea. Negative for ear pain, sinus pressure, sinus pain and sore throat.   Respiratory:  Positive for cough, chest tightness and shortness of breath.   Cardiovascular:  Negative for chest pain.  Gastrointestinal:  Negative for abdominal pain, nausea and vomiting.  Musculoskeletal:  Negative for arthralgias and myalgias.  Skin:  Negative for rash.  Neurological:  Negative for weakness and headaches.  Hematological:  Negative for adenopathy.     Physical Exam Triage Vital Signs ED Triage Vitals  Encounter Vitals Group     BP      Girls Systolic BP Percentile      Girls  Diastolic BP Percentile      Boys Systolic BP Percentile      Boys Diastolic BP Percentile      Pulse      Resp      Temp      Temp src      SpO2      Weight      Height      Head Circumference  Peak Flow      Pain Score      Pain Loc      Pain Education      Exclude from Growth Chart    No data found.  Updated Vital Signs BP 134/63 (BP Location: Right Arm)   Pulse 81   Temp 98.6 F (37 C) (Oral)   Resp 18   SpO2 (S) 98% Comment: post neb tx     Physical Exam Vitals and nursing note reviewed.  Constitutional:      General: She is not in acute distress.    Appearance: Normal appearance. She is obese. She is not ill-appearing or toxic-appearing.  HENT:     Head: Normocephalic and atraumatic.     Nose: Congestion present.     Mouth/Throat:     Mouth: Mucous membranes are moist.     Pharynx: Oropharynx is clear.  Eyes:     General: No scleral icterus.       Right eye: No discharge.        Left eye: No discharge.     Conjunctiva/sclera: Conjunctivae normal.  Cardiovascular:     Rate and Rhythm: Normal rate and regular rhythm.     Heart sounds: Normal heart sounds.  Pulmonary:     Effort: Pulmonary effort is normal. No respiratory distress.     Breath sounds: Wheezing present.  Musculoskeletal:     Cervical back: Neck supple.  Skin:    General: Skin is dry.  Neurological:     General: No focal deficit present.     Mental Status: She is alert. Mental status is at baseline.     Motor: No weakness.     Comments: Hemiplegia left side--chronic secondary to CVA. In wheelchair.  Psychiatric:        Mood and Affect: Mood normal.      UC Treatments / Results  Labs (all labs ordered are listed, but only abnormal results are displayed) Labs Reviewed  POC COVID19/FLU A&B COMBO - Normal    EKG   Radiology DG Chest 2 View Result Date: 09/10/2024 EXAM: 2 VIEW(S) XRAY OF THE CHEST 09/10/2024 10:52:09 AM COMPARISON: 10/16/2023 CLINICAL HISTORY: cough,  congestion and rib pain x 3 days. has copd FINDINGS: LUNGS AND PLEURA: Elevation of the right hemidiaphragm with lower lung volumes. No focal pulmonary opacity. No pleural effusion. No pneumothorax. HEART AND MEDIASTINUM: No acute abnormality of the cardiac and mediastinal silhouettes. BONES AND SOFT TISSUES: Multilevel thoracic osteophytosis. No acute osseous abnormality. IMPRESSION: 1. No acute cardiopulmonary abnormality. Electronically signed by: Rogelia Myers MD 09/10/2024 11:41 AM EST RP Workstation: [id number]   Study Result  Narrative & Impression  CLINICAL DATA:  Pulmonary embolism (PE) suspected, high prob hypoxia   EXAM: CT ANGIOGRAPHY CHEST WITH CONTRAST   TECHNIQUE: Multidetector CT imaging of the chest was performed using the standard protocol during bolus administration of intravenous contrast. Multiplanar CT image reconstructions and MIPs were obtained to evaluate the vascular anatomy.   RADIATION DOSE REDUCTION: This exam was performed according to the departmental dose-optimization program which includes automated exposure control, adjustment of the mA and/or kV according to patient size and/or use of iterative reconstruction technique.   CONTRAST:  OMNIPAQUE  IOHEXOL  350 MG/ML SOLN   COMPARISON:  None Available.   FINDINGS: Cardiovascular: Adequate opacification of the pulmonary arterial tree. No intraluminal filling defect identified to suggest acute pulmonary embolism. Central pulmonary arteries are of normal caliber. Mild coronary artery calcification. Cardiac size within normal limits. No pericardial effusion.  Mild atherosclerotic calcification within the thoracic aorta. No aortic aneurysm.   Mediastinum/Nodes: The thyroid  gland is atrophic or absent. No pathologic thoracic adenopathy. Esophagus unremarkable.   Lungs/Pleura: There is bronchial wall thickening and scattered areas of airway impaction peripherally in keeping with changes of  airway inflammation. There is superimposed mosaic attenuation within the lungs in keeping with multifocal air trapping related to small airway disease. No focal consolidation. No pneumothorax or pleural effusion. No central obstructing lesion.   Upper Abdomen: No acute abnormality.   Musculoskeletal: No acute bone abnormality.   Review of the MIP images confirms the above findings.   IMPRESSION: 1. No pulmonary embolism. 2. Bronchial wall thickening in keeping with airway inflammation, peripheral airway impaction, and multifocal air trapping all in keeping with changes of underlying COPD. 3. Mild coronary artery calcification.   Aortic Atherosclerosis (ICD10-I70.0).     Electronically Signed   By: Dorethia Molt M.D.   On: 12/14/2022 01:36   Procedures Procedures (including critical care time)  Medications Ordered in UC Medications  ipratropium-albuterol  (DUONEB) 0.5-2.5 (3) MG/3ML nebulizer solution 3 mL (3 mLs Nebulization Given 09/10/24 1056)    Initial Impression / Assessment and Plan / UC Course  I have reviewed the triage vital signs and the nursing notes.  Pertinent labs & imaging results that were available during my care of the patient were reviewed by me and considered in my medical decision making (see chart for details).   78 year old female with history of COPD presents for 3-day history of chills, fatigue, cough, congestion and shortness of breath. No fever.  Vitals are all stable.  Oxygen is 91%.  She is in no acute distress.  Speaking in full and clear sentences.  Has nasal congestion and scattered wheezes.  COVID/flu testing obtained as well as chest x-ray.  Negative COVID/flu.  Chest x-ray without any obvious pneumonia.  Patient given duoneb.  Oxygen improved from 91 to 98%.  Patient believes she has a nebulizer at home so I will send DuoNeb solution for her.  Treating COPD exacerbation with doxycycline .  Patient declines to take prednisone  because  it does not make her feel well when she takes it.  Also sent Promethazine DM.  Encouraged use of nebulizers, rest and fluids.  Advised to go to ED if breathing worsens or she develops fever.  X-ray overread confirms no underlying pneumonia.  No change to treatment plan.  Acute flareup of chronic underlying condition.  Patient declines AVS handout.  Final Clinical Impressions(s) / UC Diagnoses   Final diagnoses:  Other fatigue  Acute cough  COPD exacerbation [facility])   Discharge Instructions   None     ED Prescriptions     Medication Sig Dispense Auth. Provider   promethazine-dextromethorphan (PROMETHAZINE-DM) 6.25-15 MG/5ML syrup Take 5 mLs by mouth 4 (four) times daily as needed. 118 mL Arvis Huxley B, PA-C   ipratropium-albuterol  (DUONEB) 0.5-2.5 (3) MG/3ML SOLN Take 3 mLs by nebulization every 6 (six) hours as needed. 360 mL Arvis Huxley B, PA-C   doxycycline  (VIBRAMYCIN ) 100 MG capsule Take 1 capsule (100 mg total) by mouth 2 (two) times daily for 7 days. 14 capsule Arvis Huxley NOVAK, PA-C      PDMP not reviewed this encounter.   Arvis Huxley NOVAK, PA-C 09/10/24 1155

## 2024-09-10 NOTE — ED Triage Notes (Signed)
 Patient reports to UC for cough, chills, and rib pain x 3 days. SOB and sniffles since yesterday.  Treating symptoms with cough drops.
